# Patient Record
Sex: Male | Born: 1940
Health system: Southern US, Community
[De-identification: ages and names within clinical notes are randomized; demographics above are authoritative.]

## PROBLEM LIST (undated history)

## (undated) DIAGNOSIS — R7301 Impaired fasting glucose: Secondary | ICD-10-CM

## (undated) DIAGNOSIS — Z8719 Personal history of other diseases of the digestive system: Secondary | ICD-10-CM

## (undated) DIAGNOSIS — R0902 Hypoxemia: Secondary | ICD-10-CM

## (undated) DIAGNOSIS — R0602 Shortness of breath: Secondary | ICD-10-CM

## (undated) DIAGNOSIS — Z9981 Dependence on supplemental oxygen: Secondary | ICD-10-CM

## (undated) DIAGNOSIS — J189 Pneumonia, unspecified organism: Secondary | ICD-10-CM

## (undated) DIAGNOSIS — M810 Age-related osteoporosis without current pathological fracture: Secondary | ICD-10-CM

## (undated) DIAGNOSIS — K56609 Unspecified intestinal obstruction, unspecified as to partial versus complete obstruction: Secondary | ICD-10-CM

## (undated) DIAGNOSIS — J449 Chronic obstructive pulmonary disease, unspecified: Secondary | ICD-10-CM

## (undated) DIAGNOSIS — C069 Malignant neoplasm of mouth, unspecified: Secondary | ICD-10-CM

## (undated) DIAGNOSIS — Z8711 Personal history of peptic ulcer disease: Secondary | ICD-10-CM

## (undated) DIAGNOSIS — I2510 Atherosclerotic heart disease of native coronary artery without angina pectoris: Secondary | ICD-10-CM

## (undated) HISTORY — PX: ABDOMINAL SURGERY: SHX537

## (undated) HISTORY — DX: Hypoxemia: R09.02

## (undated) HISTORY — DX: Impaired fasting glucose: R73.01

## (undated) HISTORY — PX: ESOPHAGOGASTRODUODENOSCOPY: SHX1529

## (undated) HISTORY — PX: HERNIA REPAIR: SHX51

## (undated) HISTORY — PX: COLONOSCOPY: SHX174

## (undated) HISTORY — DX: Malignant neoplasm of mouth, unspecified: C06.9

## (undated) HISTORY — DX: Age-related osteoporosis without current pathological fracture: M81.0

## (undated) HISTORY — PX: ABDOMINAL EXPLORATION SURGERY: SHX538

## (undated) HISTORY — PX: GUM SURGERY: SHX658

---

## 1999-05-02 ENCOUNTER — Emergency Department (HOSPITAL_COMMUNITY): Admission: EM | Admit: 1999-05-02 | Discharge: 1999-05-02 | Payer: Self-pay | Admitting: Emergency Medicine

## 1999-05-02 ENCOUNTER — Encounter: Payer: Self-pay | Admitting: Emergency Medicine

## 1999-05-03 ENCOUNTER — Encounter: Payer: Self-pay | Admitting: Emergency Medicine

## 1999-08-15 ENCOUNTER — Ambulatory Visit (HOSPITAL_COMMUNITY): Admission: RE | Admit: 1999-08-15 | Discharge: 1999-08-15 | Payer: Self-pay | Admitting: Gastroenterology

## 2005-01-22 ENCOUNTER — Ambulatory Visit: Payer: Self-pay

## 2007-01-07 ENCOUNTER — Ambulatory Visit: Payer: Self-pay | Admitting: Internal Medicine

## 2007-01-15 ENCOUNTER — Encounter: Payer: Self-pay | Admitting: *Deleted

## 2007-02-23 ENCOUNTER — Ambulatory Visit: Payer: Self-pay | Admitting: Internal Medicine

## 2007-02-23 DIAGNOSIS — J449 Chronic obstructive pulmonary disease, unspecified: Secondary | ICD-10-CM | POA: Insufficient documentation

## 2009-06-04 ENCOUNTER — Encounter (INDEPENDENT_AMBULATORY_CARE_PROVIDER_SITE_OTHER): Payer: Self-pay

## 2009-06-04 ENCOUNTER — Inpatient Hospital Stay (HOSPITAL_COMMUNITY): Admission: EM | Admit: 2009-06-04 | Discharge: 2009-06-14 | Payer: Self-pay | Admitting: Emergency Medicine

## 2009-06-04 HISTORY — PX: UMBILICAL HERNIA REPAIR: SHX196

## 2009-10-05 HISTORY — PX: APPENDECTOMY: SHX54

## 2009-10-15 ENCOUNTER — Inpatient Hospital Stay (HOSPITAL_COMMUNITY): Admission: EM | Admit: 2009-10-15 | Discharge: 2009-10-29 | Payer: Self-pay | Admitting: Emergency Medicine

## 2009-10-19 ENCOUNTER — Encounter (INDEPENDENT_AMBULATORY_CARE_PROVIDER_SITE_OTHER): Payer: Self-pay

## 2010-01-09 ENCOUNTER — Emergency Department (HOSPITAL_COMMUNITY)
Admission: EM | Admit: 2010-01-09 | Discharge: 2010-01-09 | Payer: Self-pay | Source: Home / Self Care | Admitting: Emergency Medicine

## 2010-03-08 NOTE — Miscellaneous (Signed)
Summary: Orders Update pft charges  Clinical Lists Changes  Orders: Added new Service order of Carbon Monoxide diffusing w/capacity (94720) - Signed Added new Service order of Lung Volumes (94240) - Signed Added new Service order of Spirometry (Pre & Post) (94060) - Signed 

## 2010-04-17 LAB — COMPREHENSIVE METABOLIC PANEL
ALT: 13 U/L (ref 0–53)
AST: 19 U/L (ref 0–37)
Albumin: 3.6 g/dL (ref 3.5–5.2)
Alkaline Phosphatase: 49 U/L (ref 39–117)
BUN: 10 mg/dL (ref 6–23)
CO2: 26 mEq/L (ref 19–32)
Calcium: 9.6 mg/dL (ref 8.4–10.5)
Chloride: 105 mEq/L (ref 96–112)
Creatinine, Ser: 0.79 mg/dL (ref 0.4–1.5)
GFR calc Af Amer: >60 mL/min (ref >60)
GFR calc non Af Amer: >60 mL/min (ref >60)
Glucose, Bld: 101 mg/dL — ABNORMAL HIGH (ref 70–99)
Potassium: 4.3 mEq/L (ref 3.5–5.1)
Sodium: 139 mEq/L (ref 135–145)
Total Bilirubin: 1.7 mg/dL — ABNORMAL HIGH (ref 0.3–1.2)
Total Protein: 6.7 g/dL (ref 6.0–8.3)

## 2010-04-17 LAB — CBC
HCT: 41.6 % (ref 39.0–52.0)
Hemoglobin: 13.9 g/dL (ref 13.0–17.0)
MCH: 30.6 pg (ref 26.0–34.0)
MCHC: 33.4 g/dL (ref 30.0–36.0)
MCV: 91.6 fL (ref 78.0–100.0)
Platelets: 226 10*3/uL (ref 150–400)
RBC: 4.54 MIL/uL (ref 4.22–5.81)
RDW: 13.5 % (ref 11.5–15.5)
WBC: 8.2 10*3/uL (ref 4.0–10.5)

## 2010-04-17 LAB — URINALYSIS, ROUTINE W REFLEX MICROSCOPIC
Bilirubin Urine: NEGATIVE
Glucose, UA: NEGATIVE mg/dL
Hgb urine dipstick: NEGATIVE
Ketones, ur: NEGATIVE mg/dL
Nitrite: NEGATIVE
Protein, ur: NEGATIVE mg/dL
Specific Gravity, Urine: 1.021 (ref 1.005–1.030)
Urobilinogen, UA: 0.2 mg/dL (ref 0.0–1.0)
pH: 7.5 (ref 5.0–8.0)

## 2010-04-17 LAB — LIPASE, BLOOD: Lipase: 21 U/L (ref 11–59)

## 2010-04-17 LAB — URINE MICROSCOPIC-ADD ON

## 2010-04-19 LAB — CBC
HCT: 32.8 % — ABNORMAL LOW (ref 39.0–52.0)
HCT: 35.2 % — ABNORMAL LOW (ref 39.0–52.0)
HCT: 40.5 % (ref 39.0–52.0)
HCT: 39 % (ref 39.0–52.0)
HCT: 46.6 % (ref 39.0–52.0)
Hemoglobin: 11.2 g/dL — ABNORMAL LOW (ref 13.0–17.0)
Hemoglobin: 12 g/dL — ABNORMAL LOW (ref 13.0–17.0)
Hemoglobin: 14.3 g/dL (ref 13.0–17.0)
Hemoglobin: 13.1 g/dL (ref 13.0–17.0)
Hemoglobin: 16.4 g/dL (ref 13.0–17.0)
MCH: 30.5 pg (ref 26.0–34.0)
MCH: 31 pg (ref 26.0–34.0)
MCH: 31.7 pg (ref 26.0–34.0)
MCH: 31 pg (ref 26.0–34.0)
MCH: 31.8 pg (ref 26.0–34.0)
MCHC: 34.1 g/dL (ref 30.0–36.0)
MCHC: 34.1 g/dL (ref 30.0–36.0)
MCHC: 35.3 g/dL (ref 30.0–36.0)
MCHC: 33.6 g/dL (ref 30.0–36.0)
MCHC: 35.2 g/dL (ref 30.0–36.0)
MCV: 89.3 fL (ref 78.0–100.0)
MCV: 89.8 fL (ref 78.0–100.0)
MCV: 90.9 fL (ref 78.0–100.0)
MCV: 90.5 fL (ref 78.0–100.0)
MCV: 92.2 fL (ref 78.0–100.0)
Platelets: 163 10*3/uL (ref 150–400)
Platelets: 218 10*3/uL (ref 150–400)
Platelets: 225 10*3/uL (ref 150–400)
Platelets: 202 10*3/uL (ref 150–400)
Platelets: 267 10*3/uL (ref 150–400)
RBC: 3.61 MIL/uL — ABNORMAL LOW (ref 4.22–5.81)
RBC: 3.94 MIL/uL — ABNORMAL LOW (ref 4.22–5.81)
RBC: 4.51 MIL/uL (ref 4.22–5.81)
RBC: 4.23 MIL/uL (ref 4.22–5.81)
RBC: 5.15 MIL/uL (ref 4.22–5.81)
RDW: 12.8 % (ref 11.5–15.5)
RDW: 13 % (ref 11.5–15.5)
RDW: 13.1 % (ref 11.5–15.5)
RDW: 13.2 % (ref 11.5–15.5)
RDW: 13.4 % (ref 11.5–15.5)
WBC: 12.1 10*3/uL — ABNORMAL HIGH (ref 4.0–10.5)
WBC: 7.2 10*3/uL (ref 4.0–10.5)
WBC: 8 10*3/uL (ref 4.0–10.5)
WBC: 15.5 10*3/uL — ABNORMAL HIGH (ref 4.0–10.5)
WBC: 8.3 10*3/uL (ref 4.0–10.5)

## 2010-04-19 LAB — BASIC METABOLIC PANEL
BUN: 2 mg/dL — ABNORMAL LOW (ref 6–23)
BUN: 5 mg/dL — ABNORMAL LOW (ref 6–23)
BUN: 5 mg/dL — ABNORMAL LOW (ref 6–23)
BUN: 8 mg/dL (ref 6–23)
CO2: 26 mEq/L (ref 19–32)
CO2: 29 mEq/L (ref 19–32)
CO2: 32 mEq/L (ref 19–32)
CO2: 26 mEq/L (ref 19–32)
Calcium: 8 mg/dL — ABNORMAL LOW (ref 8.4–10.5)
Calcium: 8.1 mg/dL — ABNORMAL LOW (ref 8.4–10.5)
Calcium: 8.5 mg/dL (ref 8.4–10.5)
Calcium: 8.1 mg/dL — ABNORMAL LOW (ref 8.4–10.5)
Chloride: 101 mEq/L (ref 96–112)
Chloride: 97 mEq/L (ref 96–112)
Chloride: 99 mEq/L (ref 96–112)
Chloride: 107 mEq/L (ref 96–112)
Creatinine, Ser: 0.67 mg/dL (ref 0.4–1.5)
Creatinine, Ser: 0.73 mg/dL (ref 0.4–1.5)
Creatinine, Ser: 0.81 mg/dL (ref 0.4–1.5)
Creatinine, Ser: 0.98 mg/dL (ref 0.4–1.5)
GFR calc Af Amer: >60 mL/min (ref >60)
GFR calc Af Amer: >60 mL/min (ref >60)
GFR calc Af Amer: >60 mL/min (ref >60)
GFR calc Af Amer: >60 mL/min (ref >60)
GFR calc non Af Amer: >60 mL/min (ref >60)
GFR calc non Af Amer: >60 mL/min (ref >60)
GFR calc non Af Amer: >60 mL/min (ref >60)
GFR calc non Af Amer: >60 mL/min (ref >60)
Glucose, Bld: 118 mg/dL — ABNORMAL HIGH (ref 70–99)
Glucose, Bld: 127 mg/dL — ABNORMAL HIGH (ref 70–99)
Glucose, Bld: 144 mg/dL — ABNORMAL HIGH (ref 70–99)
Glucose, Bld: 120 mg/dL — ABNORMAL HIGH (ref 70–99)
Potassium: 3.8 mEq/L (ref 3.5–5.1)
Potassium: 3.9 mEq/L (ref 3.5–5.1)
Potassium: 4.4 mEq/L (ref 3.5–5.1)
Potassium: 4.5 mEq/L (ref 3.5–5.1)
Sodium: 131 mEq/L — ABNORMAL LOW (ref 135–145)
Sodium: 136 mEq/L (ref 135–145)
Sodium: 138 mEq/L (ref 135–145)
Sodium: 138 mEq/L (ref 135–145)

## 2010-04-19 LAB — COMPREHENSIVE METABOLIC PANEL
ALT: 15 U/L (ref 0–53)
AST: 28 U/L (ref 0–37)
Albumin: 3.9 g/dL (ref 3.5–5.2)
Alkaline Phosphatase: 67 U/L (ref 39–117)
BUN: 16 mg/dL (ref 6–23)
CO2: 22 mEq/L (ref 19–32)
Calcium: 9.6 mg/dL (ref 8.4–10.5)
Chloride: 110 mEq/L (ref 96–112)
Creatinine, Ser: 1.02 mg/dL (ref 0.4–1.5)
GFR calc Af Amer: >60 mL/min (ref >60)
GFR calc non Af Amer: >60 mL/min (ref >60)
Glucose, Bld: 154 mg/dL — ABNORMAL HIGH (ref 70–99)
Potassium: 4.6 mEq/L (ref 3.5–5.1)
Sodium: 140 mEq/L (ref 135–145)
Total Bilirubin: 1.8 mg/dL — ABNORMAL HIGH (ref 0.3–1.2)
Total Protein: 7.3 g/dL (ref 6.0–8.3)

## 2010-04-19 LAB — DIFFERENTIAL
Basophils Absolute: 0 10*3/uL (ref 0.0–0.1)
Basophils Relative: 0 % (ref 0–1)
Eosinophils Absolute: 0 10*3/uL (ref 0.0–0.7)
Eosinophils Relative: 0 % (ref 0–5)
Lymphocytes Relative: 7 % — ABNORMAL LOW (ref 12–46)
Lymphs Abs: 1.1 10*3/uL (ref 0.7–4.0)
Monocytes Absolute: 0.8 10*3/uL (ref 0.1–1.0)
Monocytes Relative: 5 % (ref 3–12)
Neutro Abs: 13.6 10*3/uL — ABNORMAL HIGH (ref 1.7–7.7)
Neutrophils Relative %: 87 % — ABNORMAL HIGH (ref 43–77)

## 2010-04-19 LAB — EXPECTORATED SPUTUM ASSESSMENT W GRAM STAIN, RFLX TO RESP C

## 2010-04-19 LAB — MAGNESIUM: Magnesium: 1.5 mg/dL (ref 1.5–2.5)

## 2010-04-19 LAB — CK TOTAL AND CKMB (NOT AT ARMC)
CK, MB: 2.9 ng/mL (ref 0.3–4.0)
Relative Index: INVALID (ref 0.0–2.5)
Total CK: 63 U/L (ref 7–232)

## 2010-04-19 LAB — CULTURE, RESPIRATORY W GRAM STAIN

## 2010-04-19 LAB — URINALYSIS, ROUTINE W REFLEX MICROSCOPIC
Glucose, UA: NEGATIVE mg/dL
Hgb urine dipstick: NEGATIVE
Ketones, ur: 15 mg/dL — AB
Nitrite: NEGATIVE
Protein, ur: NEGATIVE mg/dL
Specific Gravity, Urine: 1.028 (ref 1.005–1.030)
Urobilinogen, UA: 0.2 mg/dL (ref 0.0–1.0)
pH: 5 (ref 5.0–8.0)

## 2010-04-19 LAB — LIPASE, BLOOD: Lipase: 24 U/L (ref 11–59)

## 2010-04-19 LAB — TROPONIN I: Troponin I: 0.01 ng/mL (ref 0.00–0.06)

## 2010-04-19 LAB — PHOSPHORUS: Phosphorus: 2.1 mg/dL — ABNORMAL LOW (ref 2.3–4.6)

## 2010-04-24 LAB — BASIC METABOLIC PANEL
BUN: 2 mg/dL — ABNORMAL LOW (ref 6–23)
BUN: 5 mg/dL — ABNORMAL LOW (ref 6–23)
BUN: 8 mg/dL (ref 6–23)
BUN: 8 mg/dL (ref 6–23)
BUN: 9 mg/dL (ref 6–23)
CO2: 28 mEq/L (ref 19–32)
CO2: 29 mEq/L (ref 19–32)
CO2: 29 mEq/L (ref 19–32)
CO2: 30 mEq/L (ref 19–32)
CO2: 32 mEq/L (ref 19–32)
Calcium: 7.5 mg/dL — ABNORMAL LOW (ref 8.4–10.5)
Calcium: 8 mg/dL — ABNORMAL LOW (ref 8.4–10.5)
Calcium: 8.3 mg/dL — ABNORMAL LOW (ref 8.4–10.5)
Calcium: 8.5 mg/dL (ref 8.4–10.5)
Calcium: 8.8 mg/dL (ref 8.4–10.5)
Chloride: 100 mEq/L (ref 96–112)
Chloride: 100 mEq/L (ref 96–112)
Chloride: 102 mEq/L (ref 96–112)
Chloride: 105 mEq/L (ref 96–112)
Chloride: 107 mEq/L (ref 96–112)
Creatinine, Ser: 0.72 mg/dL (ref 0.4–1.5)
Creatinine, Ser: 0.76 mg/dL (ref 0.4–1.5)
Creatinine, Ser: 0.82 mg/dL (ref 0.4–1.5)
Creatinine, Ser: 0.88 mg/dL (ref 0.4–1.5)
Creatinine, Ser: 0.94 mg/dL (ref 0.4–1.5)
GFR calc Af Amer: >60 mL/min (ref >60)
GFR calc Af Amer: >60 mL/min (ref >60)
GFR calc Af Amer: >60 mL/min (ref >60)
GFR calc Af Amer: >60 mL/min (ref >60)
GFR calc Af Amer: >60 mL/min (ref >60)
GFR calc non Af Amer: >60 mL/min (ref >60)
GFR calc non Af Amer: >60 mL/min (ref >60)
GFR calc non Af Amer: >60 mL/min (ref >60)
GFR calc non Af Amer: >60 mL/min (ref >60)
GFR calc non Af Amer: >60 mL/min (ref >60)
Glucose, Bld: 101 mg/dL — ABNORMAL HIGH (ref 70–99)
Glucose, Bld: 107 mg/dL — ABNORMAL HIGH (ref 70–99)
Glucose, Bld: 119 mg/dL — ABNORMAL HIGH (ref 70–99)
Glucose, Bld: 127 mg/dL — ABNORMAL HIGH (ref 70–99)
Glucose, Bld: 141 mg/dL — ABNORMAL HIGH (ref 70–99)
Potassium: 4 mEq/L (ref 3.5–5.1)
Potassium: 4.1 mEq/L (ref 3.5–5.1)
Potassium: 4.1 mEq/L (ref 3.5–5.1)
Potassium: 4.1 mEq/L (ref 3.5–5.1)
Potassium: 4.9 mEq/L (ref 3.5–5.1)
Sodium: 133 mEq/L — ABNORMAL LOW (ref 135–145)
Sodium: 138 mEq/L (ref 135–145)
Sodium: 139 mEq/L (ref 135–145)
Sodium: 140 mEq/L (ref 135–145)
Sodium: 140 mEq/L (ref 135–145)

## 2010-04-24 LAB — CBC
HCT: 34.9 % — ABNORMAL LOW (ref 39.0–52.0)
HCT: 35.5 % — ABNORMAL LOW (ref 39.0–52.0)
HCT: 35.8 % — ABNORMAL LOW (ref 39.0–52.0)
HCT: 36.8 % — ABNORMAL LOW (ref 39.0–52.0)
HCT: 37.5 % — ABNORMAL LOW (ref 39.0–52.0)
HCT: 38 % — ABNORMAL LOW (ref 39.0–52.0)
HCT: 41.4 % (ref 39.0–52.0)
Hemoglobin: 12.1 g/dL — ABNORMAL LOW (ref 13.0–17.0)
Hemoglobin: 12.1 g/dL — ABNORMAL LOW (ref 13.0–17.0)
Hemoglobin: 12.5 g/dL — ABNORMAL LOW (ref 13.0–17.0)
Hemoglobin: 12.8 g/dL — ABNORMAL LOW (ref 13.0–17.0)
Hemoglobin: 12.9 g/dL — ABNORMAL LOW (ref 13.0–17.0)
Hemoglobin: 13 g/dL (ref 13.0–17.0)
Hemoglobin: 14.2 g/dL (ref 13.0–17.0)
MCHC: 34.1 g/dL (ref 30.0–36.0)
MCHC: 34.3 g/dL (ref 30.0–36.0)
MCHC: 34.3 g/dL (ref 30.0–36.0)
MCHC: 34.3 g/dL (ref 30.0–36.0)
MCHC: 34.7 g/dL (ref 30.0–36.0)
MCHC: 34.8 g/dL (ref 30.0–36.0)
MCHC: 35 g/dL (ref 30.0–36.0)
MCV: 94.3 fL (ref 78.0–100.0)
MCV: 94.3 fL (ref 78.0–100.0)
MCV: 94.6 fL (ref 78.0–100.0)
MCV: 94.9 fL (ref 78.0–100.0)
MCV: 95 fL (ref 78.0–100.0)
MCV: 95 fL (ref 78.0–100.0)
MCV: 95.1 fL (ref 78.0–100.0)
Platelets: 215 10*3/uL (ref 150–400)
Platelets: 228 10*3/uL (ref 150–400)
Platelets: 234 10*3/uL (ref 150–400)
Platelets: 265 10*3/uL (ref 150–400)
Platelets: 270 10*3/uL (ref 150–400)
Platelets: 277 10*3/uL (ref 150–400)
Platelets: 286 10*3/uL (ref 150–400)
RBC: 3.67 MIL/uL — ABNORMAL LOW (ref 4.22–5.81)
RBC: 3.73 MIL/uL — ABNORMAL LOW (ref 4.22–5.81)
RBC: 3.78 MIL/uL — ABNORMAL LOW (ref 4.22–5.81)
RBC: 3.91 MIL/uL — ABNORMAL LOW (ref 4.22–5.81)
RBC: 3.95 MIL/uL — ABNORMAL LOW (ref 4.22–5.81)
RBC: 4.02 MIL/uL — ABNORMAL LOW (ref 4.22–5.81)
RBC: 4.39 MIL/uL (ref 4.22–5.81)
RDW: 12.8 % (ref 11.5–15.5)
RDW: 12.9 % (ref 11.5–15.5)
RDW: 13 % (ref 11.5–15.5)
RDW: 13 % (ref 11.5–15.5)
RDW: 13.2 % (ref 11.5–15.5)
RDW: 13.2 % (ref 11.5–15.5)
RDW: 13.4 % (ref 11.5–15.5)
WBC: 10.4 10*3/uL (ref 4.0–10.5)
WBC: 10.8 10*3/uL — ABNORMAL HIGH (ref 4.0–10.5)
WBC: 6.2 10*3/uL (ref 4.0–10.5)
WBC: 7.4 10*3/uL (ref 4.0–10.5)
WBC: 9.6 10*3/uL (ref 4.0–10.5)
WBC: 9.6 10*3/uL (ref 4.0–10.5)
WBC: 9.6 10*3/uL (ref 4.0–10.5)

## 2010-04-24 LAB — COMPREHENSIVE METABOLIC PANEL
ALT: 20 U/L (ref 0–53)
ALT: 24 U/L (ref 0–53)
AST: 24 U/L (ref 0–37)
AST: 27 U/L (ref 0–37)
Albumin: 3.1 g/dL — ABNORMAL LOW (ref 3.5–5.2)
Albumin: 3.4 g/dL — ABNORMAL LOW (ref 3.5–5.2)
Alkaline Phosphatase: 46 U/L (ref 39–117)
Alkaline Phosphatase: 55 U/L (ref 39–117)
BUN: 11 mg/dL (ref 6–23)
BUN: 12 mg/dL (ref 6–23)
CO2: 22 mEq/L (ref 19–32)
CO2: 27 mEq/L (ref 19–32)
Calcium: 7.7 mg/dL — ABNORMAL LOW (ref 8.4–10.5)
Calcium: 9 mg/dL (ref 8.4–10.5)
Chloride: 106 mEq/L (ref 96–112)
Chloride: 108 mEq/L (ref 96–112)
Creatinine, Ser: 1.01 mg/dL (ref 0.4–1.5)
Creatinine, Ser: 1.13 mg/dL (ref 0.4–1.5)
GFR calc Af Amer: >60 mL/min (ref >60)
GFR calc Af Amer: >60 mL/min (ref >60)
GFR calc non Af Amer: >60 mL/min (ref >60)
GFR calc non Af Amer: >60 mL/min (ref >60)
Glucose, Bld: 119 mg/dL — ABNORMAL HIGH (ref 70–99)
Glucose, Bld: 135 mg/dL — ABNORMAL HIGH (ref 70–99)
Potassium: 3.3 mEq/L — ABNORMAL LOW (ref 3.5–5.1)
Potassium: 4.1 mEq/L (ref 3.5–5.1)
Sodium: 138 mEq/L (ref 135–145)
Sodium: 139 mEq/L (ref 135–145)
Total Bilirubin: 1.3 mg/dL — ABNORMAL HIGH (ref 0.3–1.2)
Total Bilirubin: 1.5 mg/dL — ABNORMAL HIGH (ref 0.3–1.2)
Total Protein: 6 g/dL (ref 6.0–8.3)
Total Protein: 6.7 g/dL (ref 6.0–8.3)

## 2010-04-24 LAB — URINALYSIS, ROUTINE W REFLEX MICROSCOPIC
Glucose, UA: NEGATIVE mg/dL
Hgb urine dipstick: NEGATIVE
Ketones, ur: 40 mg/dL — AB
Nitrite: NEGATIVE
Protein, ur: NEGATIVE mg/dL
Specific Gravity, Urine: 1.039 — ABNORMAL HIGH (ref 1.005–1.030)
Urobilinogen, UA: 1 mg/dL (ref 0.0–1.0)
pH: 6 (ref 5.0–8.0)

## 2010-04-24 LAB — SAMPLE TO BLOOD BANK

## 2010-04-24 LAB — DIFFERENTIAL
Basophils Absolute: 0 10*3/uL (ref 0.0–0.1)
Basophils Relative: 0 % (ref 0–1)
Eosinophils Absolute: 0.1 10*3/uL (ref 0.0–0.7)
Eosinophils Relative: 1 % (ref 0–5)
Lymphocytes Relative: 14 % (ref 12–46)
Lymphs Abs: 1.4 10*3/uL (ref 0.7–4.0)
Monocytes Absolute: 0.8 10*3/uL (ref 0.1–1.0)
Monocytes Relative: 9 % (ref 3–12)
Neutro Abs: 7.4 10*3/uL (ref 1.7–7.7)
Neutrophils Relative %: 77 % (ref 43–77)

## 2010-04-24 LAB — APTT: aPTT: 29 seconds (ref 24–37)

## 2010-04-24 LAB — PHOSPHORUS: Phosphorus: 2.2 mg/dL — ABNORMAL LOW (ref 2.3–4.6)

## 2010-04-24 LAB — MRSA PCR SCREENING: MRSA by PCR: NEGATIVE

## 2010-04-24 LAB — LIPASE, BLOOD: Lipase: 19 U/L (ref 11–59)

## 2010-04-24 LAB — PROTIME-INR
INR: 1.09 (ref 0.00–1.49)
Prothrombin Time: 14 seconds (ref 11.6–15.2)

## 2010-04-24 LAB — MAGNESIUM: Magnesium: 1.9 mg/dL (ref 1.5–2.5)

## 2010-04-24 LAB — LACTIC ACID, PLASMA: Lactic Acid, Venous: 1 mmol/L (ref 0.5–2.2)

## 2010-06-19 NOTE — Assessment & Plan Note (Signed)
[facility]                             PULMONARY OFFICE NOTE   NAME:Lonnie Schaefer, Lonnie Schaefer                        MRN:          [id number]  DATE:01/07/2007                            DOB:          17-Aug-1940    REASON FOR CONSULTATION:  Dyspnea.   HISTORY:  A 70 year old white male who stopped smoking in September 2005  and was not aware of any pulmonary problems at that point.  He gained  from 128 up to 159 pounds now and complains of dyspnea that has  progressed over the last 3 years, but especially over the last 6 months  where he is having trouble getting up into his truck to drive.  This is  the only time he is ever short of breath.  He says if he gets in a real  hurry when he is taking a shower he also notices the same problem.  It  does not seem to vary with weather or environmental change and seems to  make no difference which of the different inhalers he takes in terms of  improving the symptom.  He denies any orthopnea, PND, associated cough  or chest pain, fevers, chills, sweats, orthopnea, PND or leg swelling.  He denies any pleuritic pain, cough, overt sinus or reflux symptoms.   PAST MEDICAL HISTORY:  Significant for the absence of heart disease.   ALLERGIES:  None known.   MEDICATIONS:  Include Spiriva, aspirin, Proventil, Advair and Symbicort.   SOCIAL HISTORY:  He quit smoking in September 2005.  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   FAMILY HISTORY:  Negative for respiratory diseases or atopy.   REVIEW OF SYSTEMS:  Taken in detail and negative except as outlined  above.   PHYSICAL EXAMINATION:  This is a thin ambulatory white man in no acute  distress.  He is somewhat hoarse.  He afebrile with normal vital signs  and he is edentulous.  NECK:  Supple without cervical adenopathy or tenderness.  Trachea is  midline, no thyromegaly.  LUNG FIELDS:  Completely clear bilaterally to auscultation and  percussion although breath sounds are  diminished and [name] sign is  positive at mid inspiration.  There is a regular rate and rhythm without murmur, gallop, rub.  ABDOMEN:  Soft, benign.  EXTREMITIES:  Warm without calf tenderness, cyanosis, clubbing, or  edema.   Spirometry is performed today and his FEV1 is 1.88 with a ratio of 50%.   EKG was reviewed from Dr. Ernestene Mention office and is normal.  A chest x-ray  not available at the time of dictation but I understand has been done  within the last several months and is unremarkable.   IMPRESSION:  Moderate chronic obstructive pulmonary disease which is  disproportionate to his present symptoms and I believe is a result of  deconditioning with about 30 pounds of weight gain since he stopped  smoking.  This is why he is only short of breath with exertion that is  above his usual level.   The good news is that his history suggests a relatively flat response  to  bronchodilators and he probably would do just as well with Spiriva as  monotherapy, stopping all of the other inhalers except for a rescue  inhaler.  Normally, I would recommend Proventil or Foradil as a  rescue.  However, because I am not 100% sure whether he has asthma or  not, I am going to take the unusual approach of using Symbicort as  rescue, ask him to use it up to two puffs every 12 hours if he feels  he needs it.  If his breathing is the same day in and day out with or  without inhalers there is no point, of course, in using the Symbicort.  In fact, an argument can be made to stop the Spiriva as well.   I would like him to return in 6 weeks for a full set of PFTs to get a  better handle on the severity of his COPD, whether or not there is  emphysema present, and whether or not there is reversibility after  bronchodilators to consider further customizing his regimen.     Charlaine Dalton. Sherene Sires, MD, [facility]  Electronically Signed    MBW/MedQ  DD: 01/07/2007  DT: 01/08/2007  Job #: [id number]   cc:   Lonnie Schaefer,  M.D.

## 2010-06-22 NOTE — Procedures (Signed)
[facility]  Patient:    Lonnie Schaefer, Lonnie Schaefer                        MRN: [id number] Proc. Date: 08/15/99 Adm. Date:  [id number] Attending:  Orland Mustard CC:         Colen Darling. Charmayne Sheer., M.D.                           Procedure Report  PROCEDURE PERFORMED:  Esophagogastroduodenscopy with biopsies.  ENDOSCOPIST:  Llana Aliment. Edwards, M.D.  MEDICATIONS:  Hurricaine spray, fentanyl 50 mcg, Versed 7 mg IV.  INDICATIONS:  A nice gentleman had abdominal pain, had upper GI performed which showed a gastric ulcer in the fundus.  He was started on Prevacid and became symptomatic nearly immediately.  He has been asymptomatic since.  The date of the upper GI was in early June.  We have gone ahead and allowed him approximately six weeks of therapy to see if this ulcer would heal.  The reason for this endoscopy is to document healing of the ulcer in no acute distress biopsy it if present.  DESCRIPTION OF PROCEDURE:  The procedure had been explained to the patient and consent obtained.  With the patient in the left lateral decubitus position, the Olympus video endoscope was inserted blindly into the esophagus with deglutination, advanced to the stomach.  The antrum was identified and the pylorus identified and passed.  The duodenum including the bulb and second portion were seen well and was unremarkable.  The scope was withdrawn back into the stomach.  The antrum was carefully examined and was normal.  A biopsy was taken for rapid Urase test for Helicobacter pylori.  No ulcers were seen in the antrum or body.  The fundus and cardia were examined on the retroflex view.  The fundus was very carefully examined and revealed diffuse atrophy but there were no active ulcers. The scope was withdrawn, the distal and proximal esophagus were normal.  The patient tolerated the procedure quite well, was maintained on low-flow oxygen and pulse oximeter throughout the procedure  with no obvious abnormalities.  ASSESSMENT:  Healed gastric ulcer.  PLAN: Will continue the Prevacid for now. Check the results of the test for Helicobacter pylori and see back in the office in six to eight weeks. DD:  08/15/99 TD:  08/15/99 Job: [id number]

## 2010-07-06 ENCOUNTER — Other Ambulatory Visit: Payer: Self-pay | Admitting: Family Medicine

## 2010-07-12 ENCOUNTER — Ambulatory Visit
Admission: RE | Admit: 2010-07-12 | Discharge: 2010-07-12 | Disposition: A | Discharge status: Home / Self Care | Payer: Medicare Other | Source: Ambulatory Visit | Attending: Family Medicine | Admitting: Family Medicine

## 2010-08-24 ENCOUNTER — Other Ambulatory Visit: Payer: Self-pay | Admitting: Otolaryngology

## 2010-09-05 HISTORY — PX: SQUAMOUS CELL CARCINOMA EXCISION: SHX2433

## 2010-09-06 ENCOUNTER — Other Ambulatory Visit: Payer: Self-pay | Admitting: Otolaryngology

## 2010-09-06 DIAGNOSIS — C059 Malignant neoplasm of palate, unspecified: Secondary | ICD-10-CM

## 2010-09-07 ENCOUNTER — Ambulatory Visit
Admission: RE | Admit: 2010-09-07 | Discharge: 2010-09-07 | Disposition: A | Discharge status: Home / Self Care | Payer: Medicare Other | Source: Ambulatory Visit | Attending: Otolaryngology | Admitting: Otolaryngology

## 2010-09-07 DIAGNOSIS — C059 Malignant neoplasm of palate, unspecified: Secondary | ICD-10-CM

## 2010-09-07 MED ORDER — IOHEXOL 300 MG/ML  SOLN
75.0000 mL | Freq: Once | INTRAMUSCULAR | Status: AC | PRN
  Administered 2010-09-07: 75 mL via INTRAVENOUS

## 2010-09-19 ENCOUNTER — Encounter (HOSPITAL_COMMUNITY)
Admission: RE | Admit: 2010-09-19 | Discharge: 2010-09-19 | Disposition: A | Discharge status: Home / Self Care | Payer: Medicare Other | Source: Ambulatory Visit | Attending: Otolaryngology | Admitting: Otolaryngology

## 2010-09-19 ENCOUNTER — Ambulatory Visit (HOSPITAL_COMMUNITY)
Admission: RE | Admit: 2010-09-19 | Discharge: 2010-09-19 | Disposition: A | Discharge status: Home / Self Care | Payer: Medicare Other | Source: Ambulatory Visit | Attending: Otolaryngology | Admitting: Otolaryngology

## 2010-09-19 ENCOUNTER — Other Ambulatory Visit (HOSPITAL_COMMUNITY): Payer: Self-pay | Admitting: Otolaryngology

## 2010-09-19 DIAGNOSIS — Z01812 Encounter for preprocedural laboratory examination: Secondary | ICD-10-CM | POA: Insufficient documentation

## 2010-09-19 DIAGNOSIS — Z87891 Personal history of nicotine dependence: Secondary | ICD-10-CM | POA: Insufficient documentation

## 2010-09-19 DIAGNOSIS — Z01810 Encounter for preprocedural cardiovascular examination: Secondary | ICD-10-CM | POA: Insufficient documentation

## 2010-09-19 DIAGNOSIS — C710 Malignant neoplasm of cerebrum, except lobes and ventricles: Secondary | ICD-10-CM

## 2010-09-19 DIAGNOSIS — Z01818 Encounter for other preprocedural examination: Secondary | ICD-10-CM | POA: Insufficient documentation

## 2010-09-19 DIAGNOSIS — J438 Other emphysema: Secondary | ICD-10-CM | POA: Insufficient documentation

## 2010-09-19 LAB — CBC
HCT: 41.8 % (ref 39.0–52.0)
Hemoglobin: 14.3 g/dL (ref 13.0–17.0)
MCH: 30.8 pg (ref 26.0–34.0)
MCHC: 34.2 g/dL (ref 30.0–36.0)
MCV: 89.9 fL (ref 78.0–100.0)
Platelets: 245 10*3/uL (ref 150–400)
RBC: 4.65 MIL/uL (ref 4.22–5.81)
RDW: 13.2 % (ref 11.5–15.5)
WBC: 6.7 10*3/uL (ref 4.0–10.5)

## 2010-09-19 LAB — DIFFERENTIAL
Basophils Absolute: 0 10*3/uL (ref 0.0–0.1)
Basophils Relative: 0 % (ref 0–1)
Eosinophils Absolute: 0.1 10*3/uL (ref 0.0–0.7)
Eosinophils Relative: 2 % (ref 0–5)
Lymphocytes Relative: 28 % (ref 12–46)
Lymphs Abs: 1.9 10*3/uL (ref 0.7–4.0)
Monocytes Absolute: 0.6 10*3/uL (ref 0.1–1.0)
Monocytes Relative: 9 % (ref 3–12)
Neutro Abs: 4.1 10*3/uL (ref 1.7–7.7)
Neutrophils Relative %: 61 % (ref 43–77)

## 2010-09-19 LAB — SURGICAL PCR SCREEN
MRSA, PCR: NEGATIVE
Staphylococcus aureus: NEGATIVE

## 2010-09-20 ENCOUNTER — Ambulatory Visit (HOSPITAL_COMMUNITY)
Admission: RE | Admit: 2010-09-20 | Discharge: 2010-09-20 | Disposition: A | Discharge status: Home / Self Care | Payer: Medicare Other | Source: Ambulatory Visit | Attending: Otolaryngology | Admitting: Otolaryngology

## 2010-09-20 ENCOUNTER — Other Ambulatory Visit: Payer: Self-pay | Admitting: Otolaryngology

## 2010-09-20 DIAGNOSIS — Z87891 Personal history of nicotine dependence: Secondary | ICD-10-CM | POA: Insufficient documentation

## 2010-09-20 DIAGNOSIS — C059 Malignant neoplasm of palate, unspecified: Secondary | ICD-10-CM | POA: Insufficient documentation

## 2010-09-20 DIAGNOSIS — Z01818 Encounter for other preprocedural examination: Secondary | ICD-10-CM | POA: Insufficient documentation

## 2010-09-20 DIAGNOSIS — Z01812 Encounter for preprocedural laboratory examination: Secondary | ICD-10-CM | POA: Insufficient documentation

## 2010-09-20 DIAGNOSIS — J4489 Other specified chronic obstructive pulmonary disease: Secondary | ICD-10-CM | POA: Insufficient documentation

## 2010-09-20 DIAGNOSIS — J449 Chronic obstructive pulmonary disease, unspecified: Secondary | ICD-10-CM | POA: Insufficient documentation

## 2010-09-20 DIAGNOSIS — Z01810 Encounter for preprocedural cardiovascular examination: Secondary | ICD-10-CM | POA: Insufficient documentation

## 2010-10-18 ENCOUNTER — Other Ambulatory Visit: Payer: Self-pay | Admitting: Otolaryngology

## 2010-10-18 DIAGNOSIS — E041 Nontoxic single thyroid nodule: Secondary | ICD-10-CM

## 2010-11-08 NOTE — Op Note (Signed)
  NAMEJAXX, HUISH NO.:  [id number]  MEDICAL RECORD NO.:  [id number]  LOCATION:  [facility]                         FACILITY:  [facility]  PHYSICIAN:  Suzanna Obey, M.D.       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  SURGICAL PROCEDURES:  Excision of left soft palate, hard palate lesion.  ANESTHESIA:  General.  ESTIMATED BLOOD LOSS:  Less than 5 mL.  INDICATIONS:  This is a 70 year old with a squamous cell carcinoma, biopsy-proven lesion in his left palate that seems to be not deeply invasive but fairly extensive area of white leukoplakic looking area. His CT scan did not show any bone involvement and no significant nodes in the neck.  He was informed of risk and benefits of procedure and options were discussed.  All questions were answered and consent was obtained.  OPERATION:  The patient was taken to operating room, placed supine position after general endotracheal tube anesthesia was placed in the Rose position.  The McIvor mouth gag was inserted and the palate was visualized.  Incision was made around this lesion in a circumferential manner which was located at the posterior edge of the alveolar ridge down onto the soft palate and medially down to the hard palate.  This was about a 2 cm resection area and down to the muscle for the depth. This specimen was sent for frozen section evaluation and all margins were clear.  The wound was then cauterized for good hemostasis and he was then awakened, brought to recovery in stable condition.  Counts correct.          ______________________________ Suzanna Obey, M.D.     JB/MEDQ  D:  09/20/2010  T:  09/20/2010  Job:  [id number]  Electronically Signed by Suzanna Obey M.D. on  11/08/2010 10:25:07 AM

## 2011-02-06 ENCOUNTER — Ambulatory Visit
Admission: RE | Admit: 2011-02-06 | Discharge: 2011-02-06 | Disposition: A | Discharge status: Home / Self Care | Payer: Medicare Other | Source: Ambulatory Visit | Attending: Otolaryngology | Admitting: Otolaryngology

## 2011-02-06 ENCOUNTER — Other Ambulatory Visit (HOSPITAL_COMMUNITY)
Admission: RE | Admit: 2011-02-06 | Discharge: 2011-02-06 | Disposition: A | Discharge status: Home / Self Care | Payer: Medicare Other | Source: Ambulatory Visit | Attending: Interventional Radiology | Admitting: Interventional Radiology

## 2011-02-06 DIAGNOSIS — E049 Nontoxic goiter, unspecified: Secondary | ICD-10-CM | POA: Insufficient documentation

## 2011-02-06 DIAGNOSIS — E041 Nontoxic single thyroid nodule: Secondary | ICD-10-CM

## 2011-02-22 IMAGING — CR DG ABDOMEN 2V
2 series · 2 of 2 positions shown · non-contrast
Comparison: 06/09/2009

CLINICAL DATA: Abdominal pain.  Postop small bowel obstruction.

ABDOMEN - 2 VIEW

[w abdomen upright]
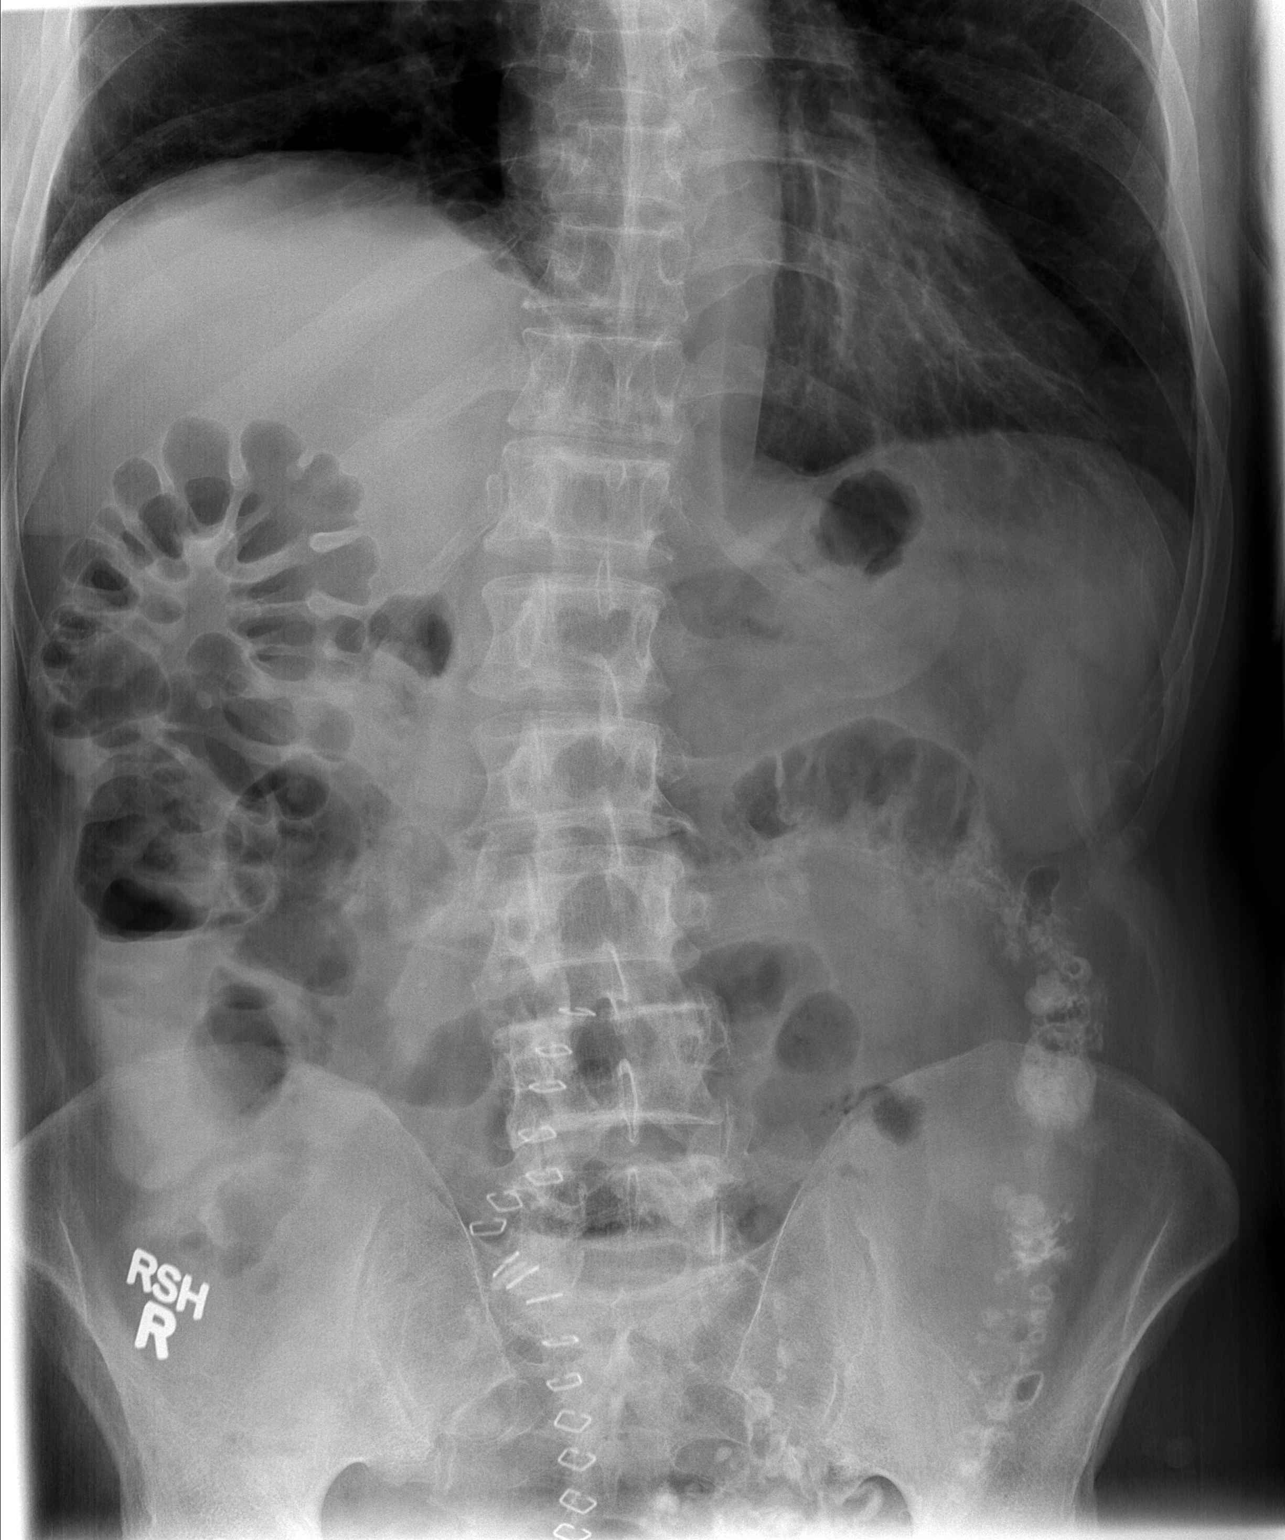

[t abdomen supine]
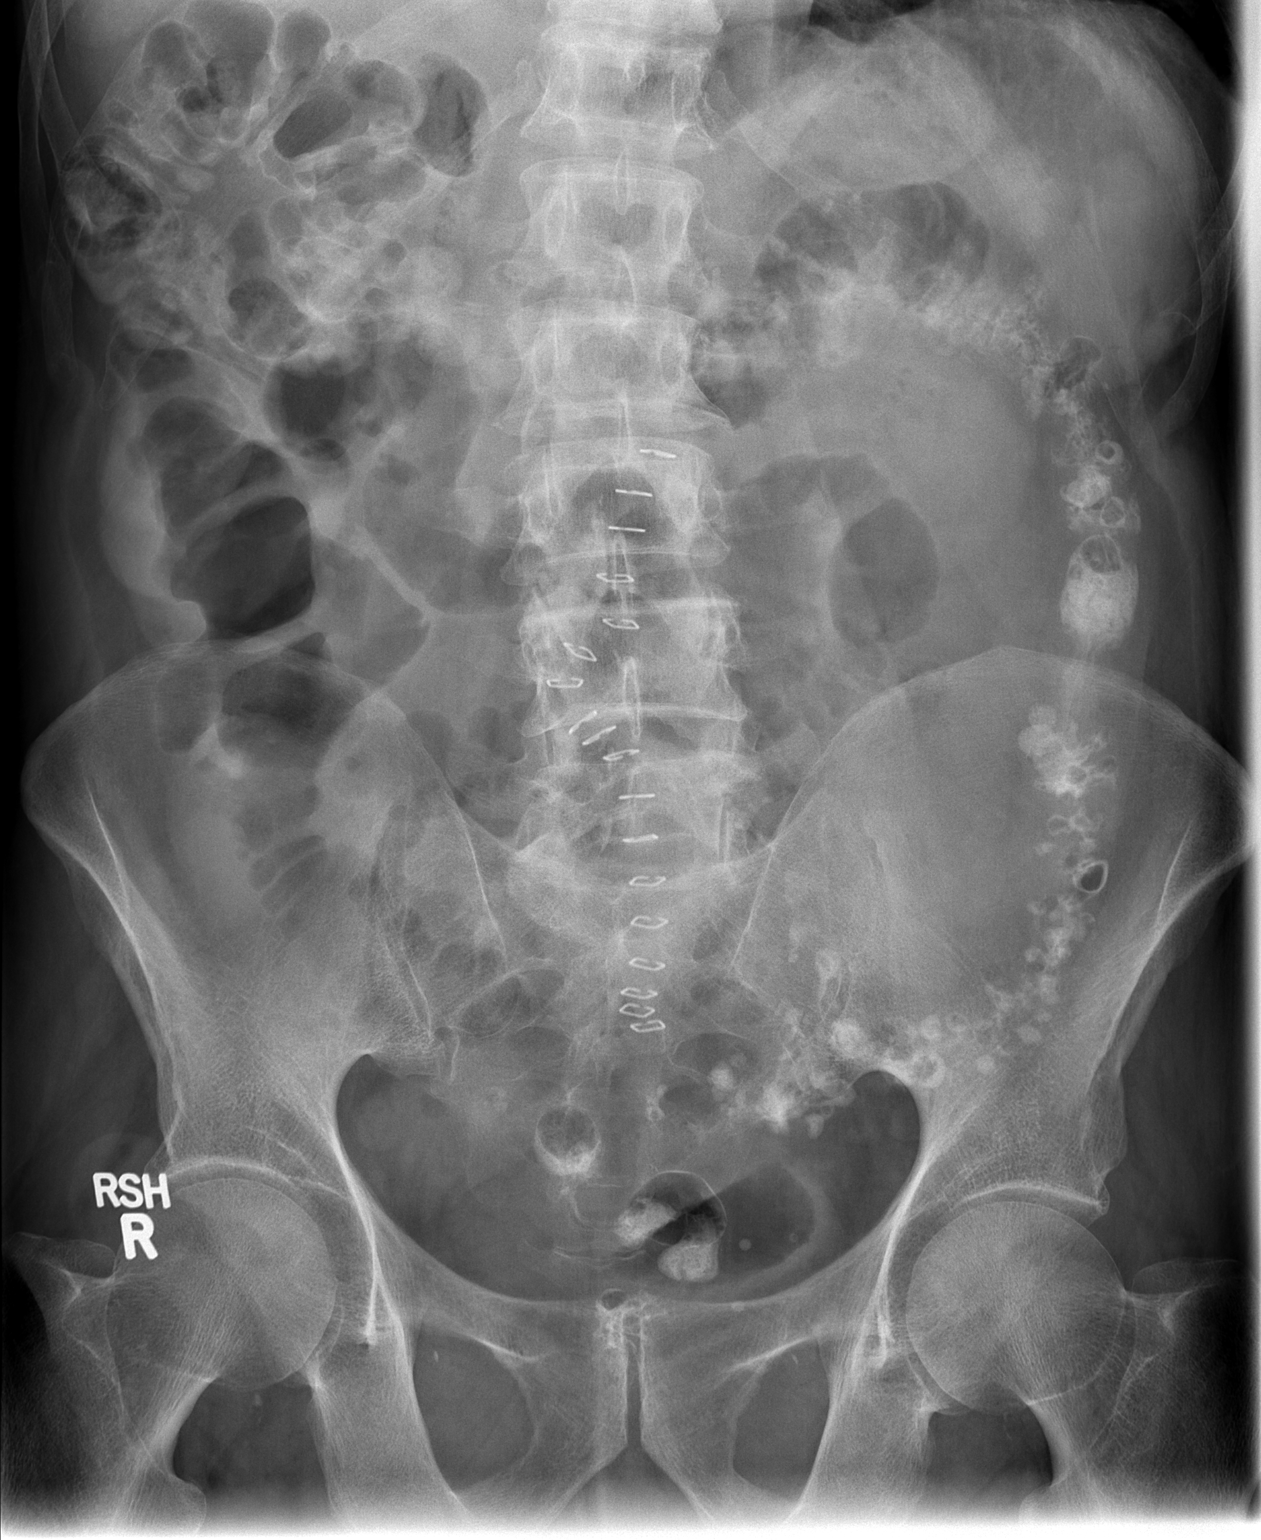

[2 of 2 positions shown; findings below may reference images not displayed]

FINDINGS: There has been interval migration of some oral contrast
into the descending and sigmoid colon.  Mild residual gaseous
prominence of small bowel in the central abdomen.  Gas is seen in
nondistended colon.  Surgical skin staples project over the central
abdomen.  Nasogastric has been removed.
IMPRESSION: Minimal residual gaseous prominence of small bowel, with gas in
nondistended colon.

## 2011-07-06 IMAGING — CR DG ABDOMEN 1V
1 series · 1 of 1 positions shown · non-contrast
Comparison: 10/18/2009

CLINICAL DATA: NG tube placement.  Partial small bowel obstruction.

ABDOMEN - 1 VIEW

[t abdomen supine]
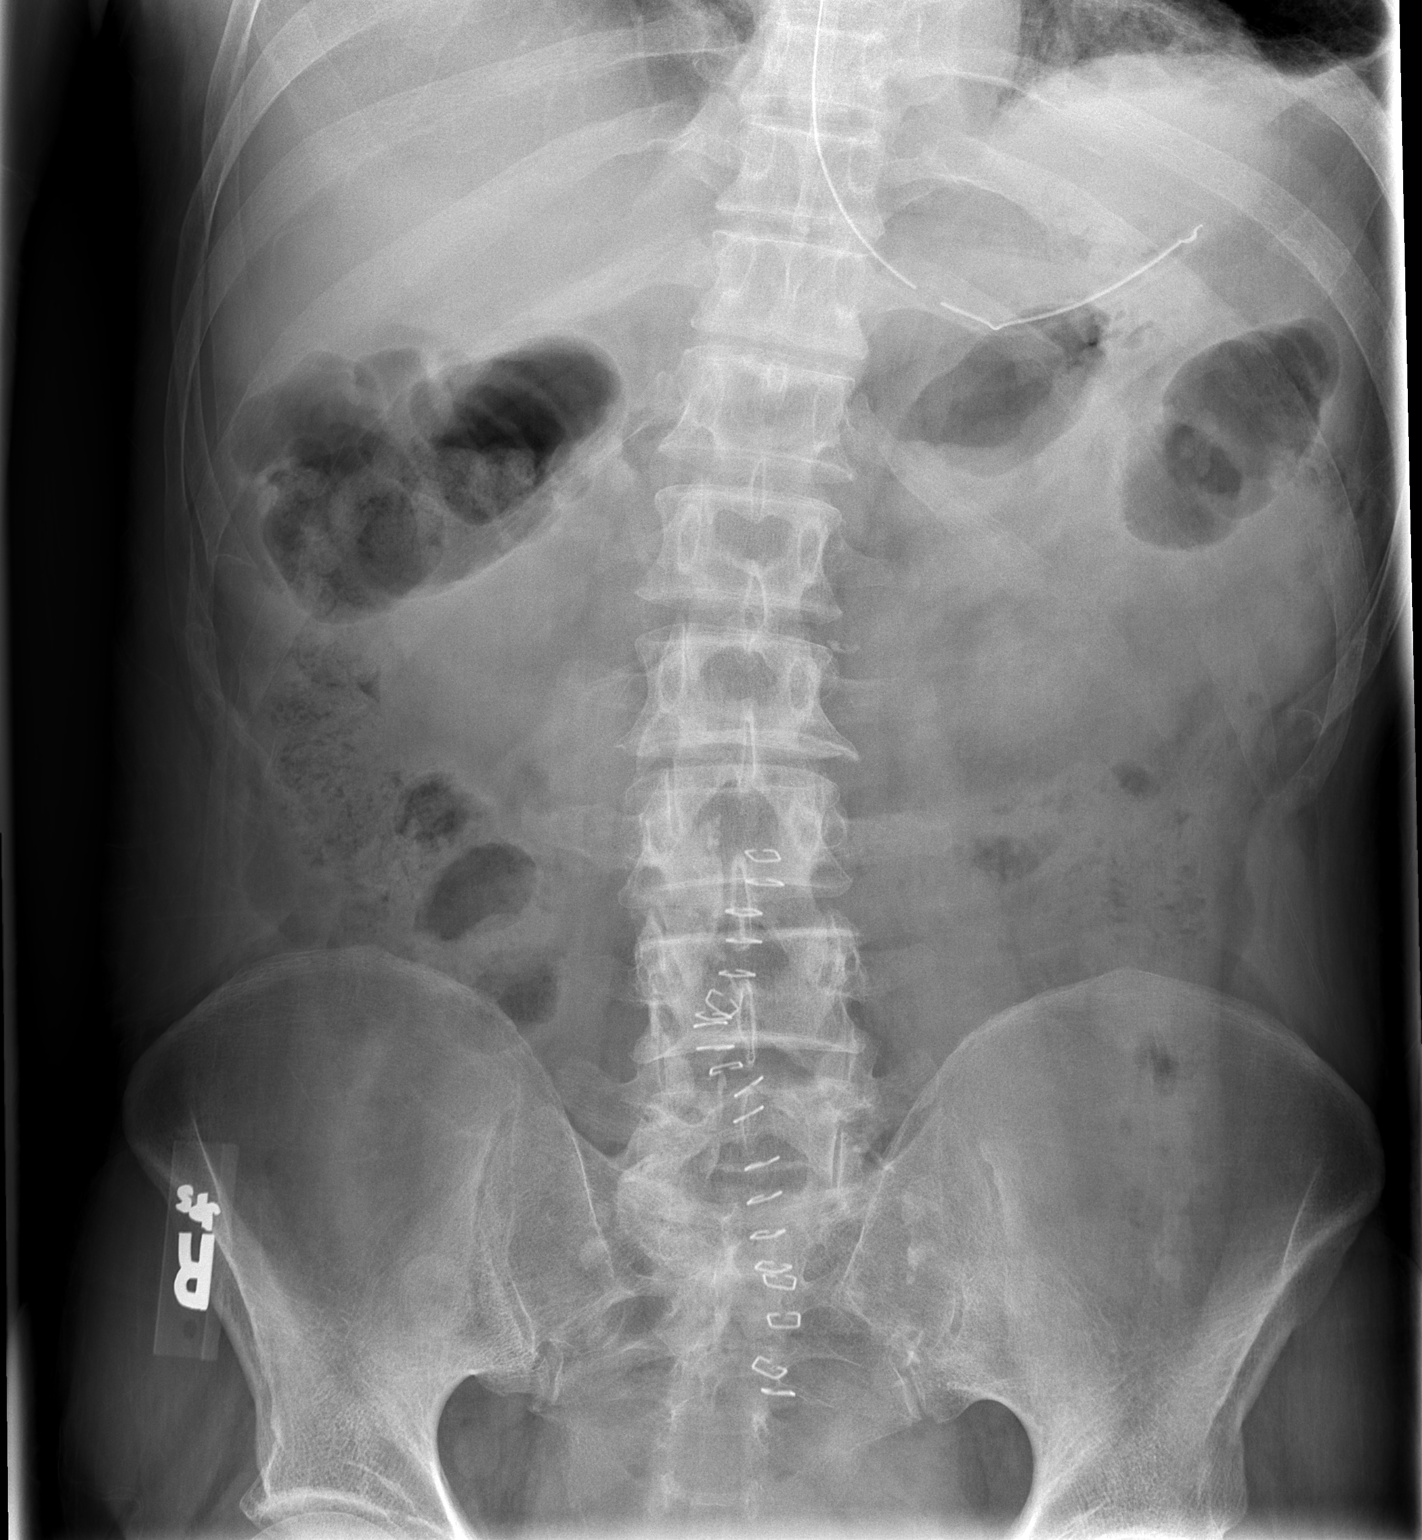

[1 of 1 positions shown; findings below may reference images not displayed]

FINDINGS: An NG tube has been placed with its tip in the fundus of
the stomach.  There is been marked reduction and small bowel
distention.  Gas is present in the colon.  A few dilated small
bowel loops persist in the upper abdomen.
IMPRESSION: Markedly improved partial small bowel obstruction pattern after NG
tube placement with its tip in the fundus of the stomach.

## 2011-09-10 ENCOUNTER — Other Ambulatory Visit: Payer: Self-pay | Admitting: Otolaryngology

## 2011-09-10 DIAGNOSIS — D34 Benign neoplasm of thyroid gland: Secondary | ICD-10-CM

## 2011-09-11 ENCOUNTER — Ambulatory Visit
Admission: RE | Admit: 2011-09-11 | Discharge: 2011-09-11 | Disposition: A | Discharge status: Home / Self Care | Payer: Medicare Other | Source: Ambulatory Visit | Attending: Otolaryngology | Admitting: Otolaryngology

## 2011-09-11 DIAGNOSIS — D34 Benign neoplasm of thyroid gland: Secondary | ICD-10-CM

## 2012-07-13 ENCOUNTER — Other Ambulatory Visit: Payer: Self-pay | Admitting: Family Medicine

## 2012-07-13 DIAGNOSIS — M810 Age-related osteoporosis without current pathological fracture: Secondary | ICD-10-CM

## 2012-07-22 ENCOUNTER — Ambulatory Visit
Admission: RE | Admit: 2012-07-22 | Discharge: 2012-07-22 | Disposition: A | Discharge status: Home / Self Care | Payer: Medicare Other | Source: Ambulatory Visit | Attending: Family Medicine | Admitting: Family Medicine

## 2012-07-22 DIAGNOSIS — M810 Age-related osteoporosis without current pathological fracture: Secondary | ICD-10-CM

## 2012-11-29 ENCOUNTER — Encounter (HOSPITAL_COMMUNITY): Payer: Self-pay | Admitting: Emergency Medicine

## 2012-11-29 ENCOUNTER — Inpatient Hospital Stay (HOSPITAL_COMMUNITY)
Admission: EM | Admit: 2012-11-29 | Discharge: 2012-12-04 | DRG: 390 | Disposition: A | Discharge status: Home / Self Care | Payer: Medicare Other | Attending: General Surgery | Admitting: General Surgery

## 2012-11-29 DIAGNOSIS — Z7982 Long term (current) use of aspirin: Secondary | ICD-10-CM

## 2012-11-29 DIAGNOSIS — J449 Chronic obstructive pulmonary disease, unspecified: Secondary | ICD-10-CM | POA: Diagnosis present

## 2012-11-29 DIAGNOSIS — K56609 Unspecified intestinal obstruction, unspecified as to partial versus complete obstruction: Principal | ICD-10-CM | POA: Diagnosis present

## 2012-11-29 DIAGNOSIS — J4489 Other specified chronic obstructive pulmonary disease: Secondary | ICD-10-CM | POA: Diagnosis present

## 2012-11-29 DIAGNOSIS — Z87891 Personal history of nicotine dependence: Secondary | ICD-10-CM

## 2012-11-29 DIAGNOSIS — R112 Nausea with vomiting, unspecified: Secondary | ICD-10-CM

## 2012-11-29 DIAGNOSIS — Z79899 Other long term (current) drug therapy: Secondary | ICD-10-CM

## 2012-11-29 HISTORY — DX: Pneumonia, unspecified organism: J18.9

## 2012-11-29 HISTORY — DX: Chronic obstructive pulmonary disease, unspecified: J44.9

## 2012-11-29 HISTORY — DX: Shortness of breath: R06.02

## 2012-11-29 HISTORY — DX: Unspecified intestinal obstruction, unspecified as to partial versus complete obstruction: K56.609

## 2012-11-29 HISTORY — DX: Personal history of other diseases of the digestive system: Z87.19

## 2012-11-29 LAB — CBC WITH DIFFERENTIAL/PLATELET
Basophils Absolute: 0 10*3/uL (ref 0.0–0.1)
Basophils Relative: 0 % (ref 0–1)
Eosinophils Absolute: 0.2 10*3/uL (ref 0.0–0.7)
Eosinophils Relative: 3 % (ref 0–5)
HCT: 47.6 % (ref 39.0–52.0)
Hemoglobin: 16.1 g/dL (ref 13.0–17.0)
Lymphocytes Relative: 10 % — ABNORMAL LOW (ref 12–46)
Lymphs Abs: 0.8 10*3/uL (ref 0.7–4.0)
MCH: 31 pg (ref 26.0–34.0)
MCHC: 33.8 g/dL (ref 30.0–36.0)
MCV: 91.7 fL (ref 78.0–100.0)
Monocytes Absolute: 0.3 10*3/uL (ref 0.1–1.0)
Monocytes Relative: 4 % (ref 3–12)
Neutro Abs: 6.8 10*3/uL (ref 1.7–7.7)
Neutrophils Relative %: 84 % — ABNORMAL HIGH (ref 43–77)
Platelets: 299 10*3/uL (ref 150–400)
RBC: 5.19 MIL/uL (ref 4.22–5.81)
RDW: 13.2 % (ref 11.5–15.5)
WBC: 8.1 10*3/uL (ref 4.0–10.5)

## 2012-11-29 LAB — COMPREHENSIVE METABOLIC PANEL
ALT: 11 U/L (ref 0–53)
AST: 18 U/L (ref 0–37)
Albumin: 3.8 g/dL (ref 3.5–5.2)
Alkaline Phosphatase: 64 U/L (ref 39–117)
BUN: 18 mg/dL (ref 6–23)
CO2: 22 mEq/L (ref 19–32)
Calcium: 9.2 mg/dL (ref 8.4–10.5)
Chloride: 102 mEq/L (ref 96–112)
Creatinine, Ser: 1.36 mg/dL — ABNORMAL HIGH (ref 0.50–1.35)
GFR calc Af Amer: 59 mL/min — ABNORMAL LOW (ref >90)
GFR calc non Af Amer: 51 mL/min — ABNORMAL LOW (ref >90)
Glucose, Bld: 192 mg/dL — ABNORMAL HIGH (ref 70–99)
Potassium: 4.3 mEq/L (ref 3.5–5.1)
Sodium: 139 mEq/L (ref 135–145)
Total Bilirubin: 2.6 mg/dL — ABNORMAL HIGH (ref 0.3–1.2)
Total Protein: 7.8 g/dL (ref 6.0–8.3)

## 2012-11-29 LAB — LACTIC ACID, PLASMA: Lactic Acid, Venous: 2.2 mmol/L (ref 0.5–2.2)

## 2012-11-29 LAB — LIPASE, BLOOD: Lipase: 17 U/L (ref 11–59)

## 2012-11-29 MED ORDER — ONDANSETRON HCL 4 MG/2ML IJ SOLN
4.0000 mg | INTRAMUSCULAR | Status: AC
  Administered 2012-11-29: 4 mg via INTRAVENOUS
  Filled 2012-11-29: qty 2

## 2012-11-29 MED ORDER — MORPHINE SULFATE 4 MG/ML IJ SOLN
4.0000 mg | Freq: Once | INTRAMUSCULAR | Status: AC
  Administered 2012-11-29: 4 mg via INTRAVENOUS
  Filled 2012-11-29: qty 1

## 2012-11-29 MED ORDER — SODIUM CHLORIDE 0.9 % IV SOLN
INTRAVENOUS | Status: DC
  Administered 2012-11-29 – 2012-11-30 (×2): via INTRAVENOUS

## 2012-11-29 NOTE — ED Notes (Signed)
Patient with abdominal pain and vomiting since 0100 this morning.  Patient states that it is the same feeling of pain and vomiting with his last bowel obstruction.

## 2012-11-29 NOTE — ED Provider Notes (Signed)
CSN: [id number]     Arrival date & time 11/29/12  2125 History   First MD Initiated Contact with Patient 11/29/12 2155     Chief Complaint  Patient presents with  . Emesis  . Abdominal Pain   (Consider location/radiation/quality/duration/timing/severity/associated sxs/prior Treatment) Patient is a 72 y.o. male presenting with vomiting and abdominal pain.  Emesis Associated symptoms: abdominal pain and diarrhea   Abdominal Pain Associated symptoms: diarrhea, nausea and vomiting    This is a 73 year old male with past medical history significant for prior bowel obstruction, presenting to the ED for sudden onset of abdominal pain, distention, nausea, vomiting, and diarrhea at 0100 this morning.  Patient states he experienced similar symptoms with his last bowel obstruction -- occurred 4 years ago, required surgical repair. He has had approximately 6 episodes of nonbloody, nonbilious emesis today, and approximately 10 episodes of thin, watery diarrhea. He has been passing gas.  Last solid BM was last night.  No PO intake today.  Denies any chest pain, SOB, or palpitations.  Past Medical History  Diagnosis Date  . Bowel obstruction    History reviewed. No pertinent past surgical history. History reviewed. No pertinent family history. History  Substance Use Topics  . Smoking status: Former Games developer  . Smokeless tobacco: Not on file  . Alcohol Use: No    Review of Systems  Gastrointestinal: Positive for nausea, vomiting, abdominal pain, diarrhea and abdominal distention.  All other systems reviewed and are negative.    Allergies  Review of patient's allergies indicates no known allergies.  Home Medications  No current outpatient prescriptions on file. BP 97/63  Pulse 123  Temp(Src) 98.2 F (36.8 C) (Oral)  Resp 16  SpO2 94%  Physical Exam  Nursing note and vitals reviewed. Constitutional: He is oriented to person, place, and time. He appears well-developed and  well-nourished. No distress.  HENT:  Head: Normocephalic and atraumatic.  Mouth/Throat: Oropharynx is clear and moist.  Eyes: Conjunctivae and EOM are normal. Pupils are equal, round, and reactive to light.  Neck: Normal range of motion.  Cardiovascular: Normal rate, regular rhythm and normal heart sounds.   Pulmonary/Chest: Effort normal and breath sounds normal. No respiratory distress. He has no wheezes.  Abdominal: Soft. He exhibits distension. Bowel sounds are decreased. There is tenderness.  Abdomen distended and diffusely tender; faint bowel sounds  Musculoskeletal: Normal range of motion.  Neurological: He is alert and oriented to person, place, and time.  Skin: Skin is warm and dry. He is not diaphoretic.  Psychiatric: He has a normal mood and affect.    ED Course  Procedures (including critical care time) Labs Review Labs Reviewed  CBC WITH DIFFERENTIAL - Abnormal; Notable for the following:    Neutrophils Relative % 84 (*)    Lymphocytes Relative 10 (*)    All other components within normal limits  COMPREHENSIVE METABOLIC PANEL - Abnormal; Notable for the following:    Glucose, Bld 192 (*)    Creatinine, Ser 1.36 (*)    Total Bilirubin 2.6 (*)    GFR calc non Af Amer 51 (*)    GFR calc Af Amer 59 (*)    All other components within normal limits  LIPASE, BLOOD  LACTIC ACID, PLASMA  URINALYSIS, ROUTINE W REFLEX MICROSCOPIC   Imaging Review Ct Abdomen Pelvis W Contrast  11/30/2012   CLINICAL DATA:  Abdominal pain and vomiting.  EXAM: CT ABDOMEN AND PELVIS WITH CONTRAST  TECHNIQUE: Multidetector CT imaging of the abdomen and pelvis  was performed using the standard protocol following bolus administration of intravenous contrast.  CONTRAST:  OMNIPAQUE IOHEXOL 300 MG/ML  SOLN  COMPARISON:  10/15/2009  FINDINGS: Patchy atelectasis/ consolidation in the visualized lung bases, right greater than left. Unremarkable liver, nondilated gallbladder, spleen, adrenal glands,  pancreas. Stable small left renal cyst. No hydronephrosis. Aortoiliac calcified plaque without aneurysm or stenosis. Portal vein patent. Stomach is distended. Multiple dilated proximal small bowel loops with transition to nondilated mid and distal small bowel loops in the left mid abdomen, without associated mass or abscess. The colon is nondistended. Innumerable descending and sigmoid diverticula without adjacent inflammatory/ edematous change. Urinary bladder is nondistended. Mild prostatic prominence. Left pelvic phleboliths. Trace pelvic ascites. No free air. No adenopathy localized. Degenerative disc disease L5-S1.  IMPRESSION: 1. Mid/distal small bowel obstruction without evident etiology, suggesting adhesions. 2. Descending and sigmoid diverticulosis.   Electronically Signed   By: Oley Balm M.D.   On: 11/30/2012 00:26    EKG Interpretation   None       MDM   1. SBO (small bowel obstruction)   2. Nausea & vomiting    Labs as above.  CT revealing mid-distal SBO.  NG tube placed.  Consulted general surgery, spoke with nurse in OR with Dr. Carolynne Edouard.  Has relayed information.  Dr. Carolynne Edouard will come and evaluate pt in the ED.  Additional pain and nausea meds given.  Garlon Hatchet, PA-C 11/30/12 0109

## 2012-11-29 NOTE — ED Notes (Signed)
Pt states he began having vomiting, diarrhea, and abd pain that started around 1am. Pt states he has had about 6 emesis and 10 episodes of diarrhea. Pt states this feels like his last bowel obstruction.

## 2012-11-30 ENCOUNTER — Emergency Department (HOSPITAL_COMMUNITY): Payer: Medicare Other

## 2012-11-30 ENCOUNTER — Inpatient Hospital Stay (HOSPITAL_COMMUNITY): Payer: Medicare Other

## 2012-11-30 ENCOUNTER — Encounter (HOSPITAL_COMMUNITY): Payer: Self-pay | Admitting: Radiology

## 2012-11-30 DIAGNOSIS — R109 Unspecified abdominal pain: Secondary | ICD-10-CM

## 2012-11-30 DIAGNOSIS — K56609 Unspecified intestinal obstruction, unspecified as to partial versus complete obstruction: Secondary | ICD-10-CM

## 2012-11-30 LAB — CBC
HCT: 39.3 % (ref 39.0–52.0)
Hemoglobin: 13.2 g/dL (ref 13.0–17.0)
MCH: 31.4 pg (ref 26.0–34.0)
MCHC: 33.6 g/dL (ref 30.0–36.0)
MCV: 93.3 fL (ref 78.0–100.0)
Platelets: 231 10*3/uL (ref 150–400)
RBC: 4.21 MIL/uL — ABNORMAL LOW (ref 4.22–5.81)
RDW: 13.6 % (ref 11.5–15.5)
WBC: 9.4 10*3/uL (ref 4.0–10.5)

## 2012-11-30 LAB — BASIC METABOLIC PANEL
BUN: 20 mg/dL (ref 6–23)
CO2: 23 mEq/L (ref 19–32)
Calcium: 7.6 mg/dL — ABNORMAL LOW (ref 8.4–10.5)
Chloride: 107 mEq/L (ref 96–112)
Creatinine, Ser: 1.22 mg/dL (ref 0.50–1.35)
GFR calc Af Amer: 68 mL/min — ABNORMAL LOW (ref >90)
GFR calc non Af Amer: 58 mL/min — ABNORMAL LOW (ref >90)
Glucose, Bld: 143 mg/dL — ABNORMAL HIGH (ref 70–99)
Potassium: 4.9 mEq/L (ref 3.5–5.1)
Sodium: 139 mEq/L (ref 135–145)

## 2012-11-30 MED ORDER — MORPHINE SULFATE 2 MG/ML IJ SOLN
2.0000 mg | INTRAMUSCULAR | Status: DC | PRN
  Administered 2012-11-30 – 2012-12-01 (×3): 2 mg via INTRAVENOUS
  Filled 2012-11-30 (×3): qty 1

## 2012-11-30 MED ORDER — PANTOPRAZOLE SODIUM 40 MG IV SOLR
40.0000 mg | Freq: Every day | INTRAVENOUS | Status: DC
  Administered 2012-11-30 – 2012-12-03 (×4): 40 mg via INTRAVENOUS
  Filled 2012-11-30 (×5): qty 40

## 2012-11-30 MED ORDER — ONDANSETRON HCL 4 MG/2ML IJ SOLN: 4.0000 mg | Freq: Four times a day (QID) | INTRAMUSCULAR | Status: DC | PRN

## 2012-11-30 MED ORDER — ALBUTEROL SULFATE HFA 108 (90 BASE) MCG/ACT IN AERS: 2.0000 | INHALATION_SPRAY | Freq: Four times a day (QID) | RESPIRATORY_TRACT | Status: DC | PRN

## 2012-11-30 MED ORDER — HEPARIN SODIUM (PORCINE) 5000 UNIT/ML IJ SOLN
5000.0000 [IU] | Freq: Three times a day (TID) | INTRAMUSCULAR | Status: DC
  Administered 2012-11-30 – 2012-12-04 (×13): 5000 [IU] via SUBCUTANEOUS
  Filled 2012-11-30 (×13): qty 1

## 2012-11-30 MED ORDER — ONDANSETRON HCL 4 MG/2ML IJ SOLN
4.0000 mg | INTRAMUSCULAR | Status: AC
  Administered 2012-11-30: 4 mg via INTRAVENOUS
  Filled 2012-11-30: qty 2

## 2012-11-30 MED ORDER — IOHEXOL 300 MG/ML  SOLN
100.0000 mL | Freq: Once | INTRAMUSCULAR | Status: AC | PRN
  Administered 2012-11-30: 100 mL via INTRAVENOUS

## 2012-11-30 MED ORDER — TIOTROPIUM BROMIDE MONOHYDRATE 18 MCG IN CAPS
18.0000 ug | ORAL_CAPSULE | Freq: Every day | RESPIRATORY_TRACT | Status: DC
  Administered 2012-12-01 – 2012-12-04 (×4): 18 ug via RESPIRATORY_TRACT
  Filled 2012-11-30 (×2): qty 5

## 2012-11-30 MED ORDER — KCL IN DEXTROSE-NACL 20-5-0.9 MEQ/L-%-% IV SOLN
INTRAVENOUS | Status: DC
  Administered 2012-11-30 – 2012-12-04 (×9): via INTRAVENOUS
  Filled 2012-11-30 (×13): qty 1000

## 2012-11-30 MED ORDER — MORPHINE SULFATE 4 MG/ML IJ SOLN
4.0000 mg | Freq: Once | INTRAMUSCULAR | Status: AC
  Administered 2012-11-30: 4 mg via INTRAVENOUS
  Filled 2012-11-30: qty 1

## 2012-11-30 NOTE — ED Notes (Signed)
Unsuccessful  attempted X3 to  place gastric tube.

## 2012-11-30 NOTE — ED Notes (Signed)
Pt family updated about the room #

## 2012-11-30 NOTE — ED Notes (Signed)
Patient transported to CT 

## 2012-11-30 NOTE — Progress Notes (Signed)
Patient ID: Lonnie Schaefer, male   DOB: Sep 11, 1940, 72 y.o.   MRN: [id number]    Subjective: Patient reports he is feeling, "about the same, maybe a little better." He has had flatus yesterday, but nothing yet today. His nausea is controlled with medications. He abdominal pain is controlled with medications. He desires to have ice chips.  Objective: Vital signs in last 24 hours: Temp:  [97.9 F (36.6 C)-98.2 F (36.8 C)] 97.9 F (36.6 C) (10/27 0332) Pulse Rate:  [96-123] 100 (10/27 0332) Resp:  [16] 16 (10/27 0332) BP: (97-122)/(58-76) 122/76 mmHg (10/27 0332) SpO2:  [89 %-99 %] 93 % (10/27 0332) Weight:  [166 lb 10.7 oz (75.6 kg)] 166 lb 10.7 oz (75.6 kg) (10/27 0332) Last BM Date: 11/30/12  Intake/Output from previous day:   Intake/Output this shift:    PE: Gen: Lying in bed, NG placed with minimal green drainage in tubing. CV: RRR Chest: CTAB. No wheezing, rales. Or crackles. Abd: Soft. Mild tenderness diffusely. No guarding or rebound. Moderately distended. Bs present. No mass palpated. Midline scar well healed.  Ext: No erythema,edema or tenderness. SCD's at bedside not placed.   Lab Results:   Recent Labs  11/29/12 2155 11/30/12 0502  WBC 8.1 9.4  HGB 16.1 13.2  HCT 47.6 39.3  PLT 299 231   BMET  Recent Labs  11/29/12 2155 11/30/12 0502  NA 139 139  K 4.3 4.9  CL 102 107  CO2 22 23  GLUCOSE 192* 143*  BUN 18 20  CREATININE 1.36* 1.22  CALCIUM 9.2 7.6*   PT/INR No results found for this basename: LABPROT, INR,  in the last 72 hours CMP     Component Value Date/Time   NA 139 11/30/2012 0502   K 4.9 11/30/2012 0502   CL 107 11/30/2012 0502   CO2 23 11/30/2012 0502   GLUCOSE 143* 11/30/2012 0502   BUN 20 11/30/2012 0502   CREATININE 1.22 11/30/2012 0502   CALCIUM 7.6* 11/30/2012 0502   PROT 7.8 11/29/2012 2155   ALBUMIN 3.8 11/29/2012 2155   AST 18 11/29/2012 2155   ALT 11 11/29/2012 2155   ALKPHOS 64 11/29/2012 2155   BILITOT 2.6* 11/29/2012  2155   GFRNONAA 58* 11/30/2012 0502   GFRAA 68* 11/30/2012 0502   Lipase     Component Value Date/Time   LIPASE 17 11/29/2012 2155       Studies/Results: Dg Abd 1 View  11/30/2012   CLINICAL DATA:  14 French NG tube placed under fluoro  EXAM: ABDOMEN - 1 VIEW  COMPARISON:  None.  FLUOROSCOPY TIME:  FLUOROSCOPY TIME 53 seconds.  FINDINGS: An enteric tube was placed under fluoroscopy has been submitted for interpretation.  A single image capture was provided which demonstrates an enteric tube terminating in the distal gastric body.  IMPRESSION: Enteric tube terminates in the distal gastric body.   Electronically Signed   By: Charline Bills M.D.   On: 11/30/2012 09:13   Ct Abdomen Pelvis W Contrast  11/30/2012   CLINICAL DATA:  Abdominal pain and vomiting.  EXAM: CT ABDOMEN AND PELVIS WITH CONTRAST  TECHNIQUE: Multidetector CT imaging of the abdomen and pelvis was performed using the standard protocol following bolus administration of intravenous contrast.  CONTRAST:  OMNIPAQUE IOHEXOL 300 MG/ML  SOLN  COMPARISON:  10/15/2009  FINDINGS: Patchy atelectasis/ consolidation in the visualized lung bases, right greater than left. Unremarkable liver, nondilated gallbladder, spleen, adrenal glands, pancreas. Stable small left renal cyst. No hydronephrosis. Aortoiliac  calcified plaque without aneurysm or stenosis. Portal vein patent. Stomach is distended. Multiple dilated proximal small bowel loops with transition to nondilated mid and distal small bowel loops in the left mid abdomen, without associated mass or abscess. The colon is nondistended. Innumerable descending and sigmoid diverticula without adjacent inflammatory/ edematous change. Urinary bladder is nondistended. Mild prostatic prominence. Left pelvic phleboliths. Trace pelvic ascites. No free air. No adenopathy localized. Degenerative disc disease L5-S1.  IMPRESSION: 1. Mid/distal small bowel obstruction without evident etiology,  suggesting adhesions. 2. Descending and sigmoid diverticulosis.   Electronically Signed   By: Oley Balm M.D.   On: 11/30/2012 00:26   Dg Abd Portable 2v  11/30/2012   CLINICAL DATA:  Small-bowel obstruction  EXAM: PORTABLE ABDOMEN - 2 VIEW  COMPARISON:  11/30/2012  FINDINGS: Contrast material is now noted throughout the colon consistent with a progression from the prior exam. The degree of small bowel dilatation is slightly decreased when compare with the prior exam. No free air is seen. The stomach remains distended.  IMPRESSION: Contrast material is now noted within the colon indicating the obstructive changes likely related to a partial small bowel obstruction. The degree of dilatation is stable from the prior study.   Electronically Signed   By: Alcide Clever M.D.   On: 11/30/2012 07:23    Anti-infectives: Anti-infectives   None     Assessment: Partial SBO  COPD H/o hiatal hernia repair (~2011) H/o Appendectomy H/o small bowel obstruction x2  Plan:  Partial SBO: NG tube placed by IR this morning.  Bowel rest and serial exams. If he does not improve over the next 24-48 hours then he may require exploration. Surgeon discussed this treatment plan with him and he is in agreement. NG tube placed  Diet: NPO, ice chips allowed DVT proph: Hep SQ No antibiotics warranted at this time, WBC normal. Afebrile.     LOS: 1 day   Felix Pacini 11/30/2012, 10:39 AM Pager: [phone]

## 2012-11-30 NOTE — Progress Notes (Signed)
Less distended, but still no flatus.    Has had NG for a few hours.  Will repeat films in AM.  Continue NG tube and bowel rest.  May eventually need surgery.  Wilmon Arms. Corliss Skains, MD, [facility]  General/ Trauma Surgery  11/30/2012 1:22 PM

## 2012-11-30 NOTE — H&P (Signed)
Lonnie Schaefer is an 72 y.o. male.   Chief Complaint: abdominal pain HPI: The patient is a 72 year old white male who began having abdominal pain on Sunday morning about 1 AM.  His pain has been fairly constant. All day Sunday he had diarrhea. He had one episode of vomiting about 10 AM. He denies any fevers or chills. The diarrhea has resolved and he states that he is still passing some flatus. He had a CT scan done that suggested a small bowel obstruction most likely secondary to adhesions. He has a history of 2 abdominal surgeries for bowel obstruction, the last was approximately 4 years ago. He feels better now and is not complaining of abdominal pain  Past Medical History  Diagnosis Date  . Bowel obstruction     History reviewed. No pertinent past surgical history.  History reviewed. No pertinent family history. Social History:  reports that he has quit smoking. He does not have any smokeless tobacco history on file. He reports that he does not drink alcohol or use illicit drugs.  Allergies: No Known Allergies   (Not in a hospital admission)  Results for orders placed during the hospital encounter of 11/29/12 (from the past 48 hour(s))  CBC WITH DIFFERENTIAL     Status: Abnormal   Collection Time    11/29/12  9:55 PM      Result Value Range   WBC 8.1  4.0 - 10.5 K/uL   RBC 5.19  4.22 - 5.81 MIL/uL   Hemoglobin 16.1  13.0 - 17.0 g/dL   HCT 16.1  09.6 - 04.5 %   MCV 91.7  78.0 - 100.0 fL   MCH 31.0  26.0 - 34.0 pg   MCHC 33.8  30.0 - 36.0 g/dL   RDW 40.9  81.1 - 91.4 %   Platelets 299  150 - 400 K/uL   Neutrophils Relative % 84 (*) 43 - 77 %   Neutro Abs 6.8  1.7 - 7.7 K/uL   Lymphocytes Relative 10 (*) 12 - 46 %   Lymphs Abs 0.8  0.7 - 4.0 K/uL   Monocytes Relative 4  3 - 12 %   Monocytes Absolute 0.3  0.1 - 1.0 K/uL   Eosinophils Relative 3  0 - 5 %   Eosinophils Absolute 0.2  0.0 - 0.7 K/uL   Basophils Relative 0  0 - 1 %   Basophils Absolute 0.0  0.0 - 0.1 K/uL   LIPASE, BLOOD     Status: None   Collection Time    11/29/12  9:55 PM      Result Value Range   Lipase 17  11 - 59 U/L  COMPREHENSIVE METABOLIC PANEL     Status: Abnormal   Collection Time    11/29/12  9:55 PM      Result Value Range   Sodium 139  135 - 145 mEq/L   Potassium 4.3  3.5 - 5.1 mEq/L   Chloride 102  96 - 112 mEq/L   CO2 22  19 - 32 mEq/L   Glucose, Bld 192 (*) 70 - 99 mg/dL   BUN 18  6 - 23 mg/dL   Creatinine, Ser 7.82 (*) 0.50 - 1.35 mg/dL   Calcium 9.2  8.4 - 95.6 mg/dL   Total Protein 7.8  6.0 - 8.3 g/dL   Albumin 3.8  3.5 - 5.2 g/dL   AST 18  0 - 37 U/L   ALT 11  0 - 53 U/L  Alkaline Phosphatase 64  39 - 117 U/L   Total Bilirubin 2.6 (*) 0.3 - 1.2 mg/dL   GFR calc non Af Amer 51 (*) >90 mL/min   GFR calc Af Amer 59 (*) >90 mL/min   Comment: (NOTE)     The eGFR has been calculated using the CKD EPI equation.     This calculation has not been validated in all clinical situations.     eGFR's persistently <90 mL/min signify possible Chronic Kidney     Disease.  LACTIC ACID, PLASMA     Status: None   Collection Time    11/29/12 10:14 PM      Result Value Range   Lactic Acid, Venous 2.2  0.5 - 2.2 mmol/L   Ct Abdomen Pelvis W Contrast  11/30/2012   CLINICAL DATA:  Abdominal pain and vomiting.  EXAM: CT ABDOMEN AND PELVIS WITH CONTRAST  TECHNIQUE: Multidetector CT imaging of the abdomen and pelvis was performed using the standard protocol following bolus administration of intravenous contrast.  CONTRAST:  OMNIPAQUE IOHEXOL 300 MG/ML  SOLN  COMPARISON:  10/15/2009  FINDINGS: Patchy atelectasis/ consolidation in the visualized lung bases, right greater than left. Unremarkable liver, nondilated gallbladder, spleen, adrenal glands, pancreas. Stable small left renal cyst. No hydronephrosis. Aortoiliac calcified plaque without aneurysm or stenosis. Portal vein patent. Stomach is distended. Multiple dilated proximal small bowel loops with transition to nondilated mid  and distal small bowel loops in the left mid abdomen, without associated mass or abscess. The colon is nondistended. Innumerable descending and sigmoid diverticula without adjacent inflammatory/ edematous change. Urinary bladder is nondistended. Mild prostatic prominence. Left pelvic phleboliths. Trace pelvic ascites. No free air. No adenopathy localized. Degenerative disc disease L5-S1.  IMPRESSION: 1. Mid/distal small bowel obstruction without evident etiology, suggesting adhesions. 2. Descending and sigmoid diverticulosis.   Electronically Signed   By: Oley Balm M.D.   On: 11/30/2012 00:26    Review of Systems  Constitutional: Negative.   HENT: Negative.   Eyes: Negative.   Respiratory: Negative.   Cardiovascular: Negative.   Gastrointestinal: Positive for nausea, vomiting, abdominal pain and diarrhea.  Genitourinary: Negative.   Musculoskeletal: Negative.   Skin: Negative.   Neurological: Negative.   Endo/Heme/Allergies: Negative.   Psychiatric/Behavioral: Negative.     Blood pressure 98/61, pulse 108, temperature 98.2 F (36.8 C), temperature source Oral, resp. rate 16, SpO2 96.00%. Physical Exam  Constitutional: He is oriented to person, place, and time. He appears well-developed and well-nourished.  HENT:  Head: Normocephalic and atraumatic.  Eyes: Conjunctivae and EOM are normal. Pupils are equal, round, and reactive to light.  Neck: Normal range of motion. Neck supple.  Cardiovascular: Normal rate, regular rhythm and normal heart sounds.   Respiratory: Effort normal and breath sounds normal.  GI: Soft. Bowel sounds are normal.  There is mild to moderate distension. No guarding or peritonitis. Good bs. Well healed midline scar  Musculoskeletal: Normal range of motion.  Neurological: He is alert and oriented to person, place, and time.  Skin: Skin is warm and dry.  Psychiatric: He has a normal mood and affect. His behavior is normal.     Assessment/Plan The patient  has evidence of at least a partial small bowel obstruction. Since his abdomen is otherwise soft and he does not appear in a like think it would be reasonable to treat him with NG tube and bowel rest and serial exams. If he does not improve over the next 24-48 hours then he may require  exploration. I've discussed this treatment plan with him and he is in agreement.  TOTH III,Esmay Amspacher S 11/30/2012, 2:07 AM

## 2012-12-01 LAB — URINALYSIS, ROUTINE W REFLEX MICROSCOPIC
Bilirubin Urine: NEGATIVE
Glucose, UA: NEGATIVE mg/dL
Hgb urine dipstick: NEGATIVE
Ketones, ur: NEGATIVE mg/dL
Leukocytes, UA: NEGATIVE
Nitrite: NEGATIVE
Protein, ur: NEGATIVE mg/dL
Specific Gravity, Urine: 1.043 — ABNORMAL HIGH (ref 1.005–1.030)
Urobilinogen, UA: 0.2 mg/dL (ref 0.0–1.0)
pH: 5.5 (ref 5.0–8.0)

## 2012-12-01 NOTE — Progress Notes (Signed)
Subjective: He says he is less distended, feels sore but better, not taking anything for pain.  No flatus.  NG sump filter changed. Ng working.  Objective: Vital signs in last 24 hours: Temp:  [97.8 F (36.6 C)-98.2 F (36.8 C)] 98.1 F (36.7 C) (10/28 0848) Pulse Rate:  [79-90] 90 (10/28 0848) Resp:  [17-20] 18 (10/28 0848) BP: (95-115)/(47-67) 115/66 mmHg (10/28 0848) SpO2:  [93 %-97 %] 93 % (10/28 0546) Last BM Date: 11/30/12 100 from NG; NPO 775 urine Afebrile, VSS No labs Intake/Output from previous day: 10/27 0701 - 10/28 0700 In: 1531.7 [I.V.:1531.7] Out: 875 [Urine:775; Emesis/NG output:100] Intake/Output this shift:    General appearance: alert, cooperative and no distress Resp: clear to auscultation bilaterally GI: soft, mildly distended,  very few BS.  no real pain, just sore.  Lab Results:   Recent Labs  11/29/12 2155 11/30/12 0502  WBC 8.1 9.4  HGB 16.1 13.2  HCT 47.6 39.3  PLT 299 231    BMET  Recent Labs  11/29/12 2155 11/30/12 0502  NA 139 139  K 4.3 4.9  CL 102 107  CO2 22 23  GLUCOSE 192* 143*  BUN 18 20  CREATININE 1.36* 1.22  CALCIUM 9.2 7.6*   PT/INR No results found for this basename: LABPROT, INR,  in the last 72 hours   Recent Labs Lab 11/29/12 2155  AST 18  ALT 11  ALKPHOS 64  BILITOT 2.6*  PROT 7.8  ALBUMIN 3.8     Lipase     Component Value Date/Time   LIPASE 17 11/29/2012 2155     Studies/Results: Dg Abd 1 View  11/30/2012   CLINICAL DATA:  14 French NG tube placed under fluoro  EXAM: ABDOMEN - 1 VIEW  COMPARISON:  None.  FLUOROSCOPY TIME:  FLUOROSCOPY TIME 53 seconds.  FINDINGS: An enteric tube was placed under fluoroscopy has been submitted for interpretation.  A single image capture was provided which demonstrates an enteric tube terminating in the distal gastric body.  IMPRESSION: Enteric tube terminates in the distal gastric body.   Electronically Signed   By: Charline Bills M.D.   On: 11/30/2012  09:13   Ct Abdomen Pelvis W Contrast  11/30/2012   CLINICAL DATA:  Abdominal pain and vomiting.  EXAM: CT ABDOMEN AND PELVIS WITH CONTRAST  TECHNIQUE: Multidetector CT imaging of the abdomen and pelvis was performed using the standard protocol following bolus administration of intravenous contrast.  CONTRAST:  OMNIPAQUE IOHEXOL 300 MG/ML  SOLN  COMPARISON:  10/15/2009  FINDINGS: Patchy atelectasis/ consolidation in the visualized lung bases, right greater than left. Unremarkable liver, nondilated gallbladder, spleen, adrenal glands, pancreas. Stable small left renal cyst. No hydronephrosis. Aortoiliac calcified plaque without aneurysm or stenosis. Portal vein patent. Stomach is distended. Multiple dilated proximal small bowel loops with transition to nondilated mid and distal small bowel loops in the left mid abdomen, without associated mass or abscess. The colon is nondistended. Innumerable descending and sigmoid diverticula without adjacent inflammatory/ edematous change. Urinary bladder is nondistended. Mild prostatic prominence. Left pelvic phleboliths. Trace pelvic ascites. No free air. No adenopathy localized. Degenerative disc disease L5-S1.  IMPRESSION: 1. Mid/distal small bowel obstruction without evident etiology, suggesting adhesions. 2. Descending and sigmoid diverticulosis.   Electronically Signed   By: Oley Balm M.D.   On: 11/30/2012 00:26   Dg Abd Portable 2v  11/30/2012   CLINICAL DATA:  Small-bowel obstruction  EXAM: PORTABLE ABDOMEN - 2 VIEW  COMPARISON:  11/30/2012  FINDINGS:  Contrast material is now noted throughout the colon consistent with a progression from the prior exam. The degree of small bowel dilatation is slightly decreased when compare with the prior exam. No free air is seen. The stomach remains distended.  IMPRESSION: Contrast material is now noted within the colon indicating the obstructive changes likely related to a partial small bowel obstruction. The degree  of dilatation is stable from the prior study.   Electronically Signed   By: Alcide Clever M.D.   On: 11/30/2012 07:23   Dg Basil Dess Tube Plc W/fl-no Rad  11/30/2012   CLINICAL DATA: sbo   NASO G TUBE PLACEMENT WITH FLUORO  Fluoroscopy was utilized by the requesting physician.  No radiographic  interpretation.     Medications: . heparin subcutaneous  5,000 Units Subcutaneous Q8H  . pantoprazole (PROTONIX) IV  40 mg Intravenous QHS  . tiotropium  18 mcg Inhalation Daily   . dextrose 5 % and 0.9 % NaCl with KCl 20 mEq/L 100 mL/hr at 12/01/12 0539   Prior to Admission medications   Medication Sig Start Date End Date Taking? Authorizing Provider  albuterol (PROVENTIL HFA;VENTOLIN HFA) 108 (90 BASE) MCG/ACT inhaler Inhale 2 puffs into the lungs every 6 (six) hours as needed for wheezing.   Yes Historical Provider, MD  alendronate (FOSAMAX) 70 MG tablet Take 70 mg by mouth every 7 (seven) days. Take with a full glass of water on an empty stomach. Take on Tuesday   Yes Historical Provider, MD  aspirin 81 MG chewable tablet Chew 81 mg by mouth daily.   Yes Historical Provider, MD  cholecalciferol (VITAMIN D) 1000 UNITS tablet Take 1,000 Units by mouth daily.   Yes Historical Provider, MD  clobetasol ointment (TEMOVATE) 0.05 % Apply 1 application topically 2 (two) times daily.   Yes Historical Provider, MD  tiotropium (SPIRIVA) 18 MCG inhalation capsule Place 18 mcg into inhaler and inhale daily.   Yes Historical Provider, MD  triamcinolone (KENALOG) 0.025 % cream Apply 1 application topically 2 (two) times daily.   Yes Historical Provider, MD     Assessment/Plan Partial SBO  COPD  H/o hiatal hernia repair (~2011)  H/o Appendectomy  H/o small bowel obstruction x2   Plan:  Continue NG, mobilize, fluids recheck labs tomorrow.   LOS: 2 days    Lonnie Schaefer 12/01/2012

## 2012-12-01 NOTE — Progress Notes (Signed)
Patient reports much more flatus and some liquid bowel movements today. Abd much softer, no distention.  + BS; soft abdomen  Will leave NG until AM, since it was so difficult to place.  If he continues to have bowel function, will d/c/ NG in AM and will begin clears.  Wilmon Arms. Corliss Skains, MD, [facility] Surgery  General/ Trauma Surgery  12/01/2012 1:26 PM

## 2012-12-02 ENCOUNTER — Inpatient Hospital Stay (HOSPITAL_COMMUNITY): Payer: Medicare Other

## 2012-12-02 LAB — BASIC METABOLIC PANEL
BUN: 4 mg/dL — ABNORMAL LOW (ref 6–23)
CO2: 26 mEq/L (ref 19–32)
Calcium: 7.2 mg/dL — ABNORMAL LOW (ref 8.4–10.5)
Chloride: 108 mEq/L (ref 96–112)
Creatinine, Ser: 0.78 mg/dL (ref 0.50–1.35)
GFR calc Af Amer: >90 mL/min (ref >90)
GFR calc non Af Amer: 89 mL/min — ABNORMAL LOW (ref >90)
Glucose, Bld: 106 mg/dL — ABNORMAL HIGH (ref 70–99)
Potassium: 4 mEq/L (ref 3.5–5.1)
Sodium: 139 mEq/L (ref 135–145)

## 2012-12-02 LAB — CBC
HCT: 31.6 % — ABNORMAL LOW (ref 39.0–52.0)
Hemoglobin: 11 g/dL — ABNORMAL LOW (ref 13.0–17.0)
MCH: 31.9 pg (ref 26.0–34.0)
MCHC: 34.8 g/dL (ref 30.0–36.0)
MCV: 91.6 fL (ref 78.0–100.0)
Platelets: 187 10*3/uL (ref 150–400)
RBC: 3.45 MIL/uL — ABNORMAL LOW (ref 4.22–5.81)
RDW: 13.5 % (ref 11.5–15.5)
WBC: 5.1 10*3/uL (ref 4.0–10.5)

## 2012-12-02 NOTE — Progress Notes (Signed)
Subjective: No real improvement.  He says he has had some gas, but drainage from NG shows cannister is almost full, and it's a clear with a white colored sediment in it now. I don't hear much   Objective: Vital signs in last 24 hours: Temp:  [98.3 F (36.8 C)-98.9 F (37.2 C)] 98.5 F (36.9 C) (10/29 0530) Pulse Rate:  [72-87] 82 (10/29 0530) Resp:  [16-20] 20 (10/29 0530) BP: (102-121)/(53-59) 102/53 mmHg (10/29 0530) SpO2:  [96 %-99 %] 98 % (10/29 0530) Last BM Date: 12/01/12 175 from NG. NPO Afebrile, VSS Labs OK  Intake/Output from previous day: 10/28 0701 - 10/29 0700 In: 3203.3 [I.V.:3203.3] Out: 725 [Urine:550; Emesis/NG output:175] Intake/Output this shift: Total I/O In: 100 [I.V.:100] Out: 350 [Urine:350]  General appearance: alert, cooperative and no distress Resp: clear to auscultation bilaterally GI: no bowel sounds still distended, not really tender, just sore  Lab Results:   Recent Labs  11/30/12 0502 12/02/12 0455  WBC 9.4 5.1  HGB 13.2 11.0*  HCT 39.3 31.6*  PLT 231 187    BMET  Recent Labs  11/30/12 0502 12/02/12 0455  NA 139 139  K 4.9 4.0  CL 107 108  CO2 23 26  GLUCOSE 143* 106*  BUN 20 4*  CREATININE 1.22 0.78  CALCIUM 7.6* 7.2*   PT/INR No results found for this basename: LABPROT, INR,  in the last 72 hours   Recent Labs Lab 11/29/12 2155  AST 18  ALT 11  ALKPHOS 64  BILITOT 2.6*  PROT 7.8  ALBUMIN 3.8     Lipase     Component Value Date/Time   LIPASE 17 11/29/2012 2155     Studies/Results: Dg Abd 1 View  11/30/2012   CLINICAL DATA:  14 French NG tube placed under fluoro  EXAM: ABDOMEN - 1 VIEW  COMPARISON:  None.  FLUOROSCOPY TIME:  FLUOROSCOPY TIME 53 seconds.  FINDINGS: An enteric tube was placed under fluoroscopy has been submitted for interpretation.  A single image capture was provided which demonstrates an enteric tube terminating in the distal gastric body.  IMPRESSION: Enteric tube terminates in the  distal gastric body.   Electronically Signed   By: Charline Bills M.D.   On: 11/30/2012 09:13   Dg Vangie Bicker G Tube Plc W/fl-no Rad  11/30/2012   CLINICAL DATA: sbo   NASO G TUBE PLACEMENT WITH FLUORO  Fluoroscopy was utilized by the requesting physician.  No radiographic  interpretation.     Medications: . heparin subcutaneous  5,000 Units Subcutaneous Q8H  . pantoprazole (PROTONIX) IV  40 mg Intravenous QHS  . tiotropium  18 mcg Inhalation Daily   Prior to Admission medications   Medication Sig Start Date End Date Taking? Authorizing Provider  albuterol (PROVENTIL HFA;VENTOLIN HFA) 108 (90 BASE) MCG/ACT inhaler Inhale 2 puffs into the lungs every 6 (six) hours as needed for wheezing.   Yes Historical Provider, MD  alendronate (FOSAMAX) 70 MG tablet Take 70 mg by mouth every 7 (seven) days. Take with a full glass of water on an empty stomach. Take on Tuesday   Yes Historical Provider, MD  aspirin 81 MG chewable tablet Chew 81 mg by mouth daily.   Yes Historical Provider, MD  cholecalciferol (VITAMIN D) 1000 UNITS tablet Take 1,000 Units by mouth daily.   Yes Historical Provider, MD  clobetasol ointment (TEMOVATE) 0.05 % Apply 1 application topically 2 (two) times daily.   Yes Historical Provider, MD  tiotropium (SPIRIVA) 18 MCG inhalation capsule  Place 18 mcg into inhaler and inhale daily.   Yes Historical Provider, MD  triamcinolone (KENALOG) 0.025 % cream Apply 1 application topically 2 (two) times daily.   Yes Historical Provider, MD     Assessment/Plan  SBO  Film: The distended gas-filled loops of small bowel are less conspicuous.  There is a mildly dilated loop of small bowel in the left lower  abdomen. There is oral contrast within the colon. Evidence for  colonic diverticula. Nasogastric tube tip is near the pylorus. No  evidence for free air on the upright view. Difficult to exclude  small pleural effusions.  COPD on O2 at night at home. H/o hiatal hernia repair (~2011)  H/o  Appendectomy  H/o small bowel obstruction x2   Plan:  Check film continue NG .  Keep NPO. Film says it's a little better, Keep NPO and watch for now.  LOS: 3 days    Lonnie Schaefer 12/02/2012

## 2012-12-02 NOTE — Progress Notes (Signed)
Seen and agree Abdomen seems a little less distended and is soft Abdominal xrays look a little better Continue ng and bowel rest for now

## 2012-12-02 NOTE — Progress Notes (Signed)
Pt up ambulating in hallway independently at this time; no needs voiced; will cont. To monitor. 

## 2012-12-02 NOTE — Progress Notes (Signed)
Pt up ambulating in hallway with NT at this time; pt denies pain; steady gait noted; will cont. To monitor.

## 2012-12-02 NOTE — Progress Notes (Signed)
Pt up ambulating at this time; family in room; no complaints; will cont. To monitor.

## 2012-12-03 DIAGNOSIS — J4489 Other specified chronic obstructive pulmonary disease: Secondary | ICD-10-CM

## 2012-12-03 DIAGNOSIS — J449 Chronic obstructive pulmonary disease, unspecified: Secondary | ICD-10-CM

## 2012-12-03 NOTE — Progress Notes (Signed)
  Subjective: Pt reports having 2-3 bms yesterday and 1 more this AM.  +flatus.  Reports abdomen is "sore."  Ambulating in hallways.  Denies n/v.  Denies fever or chills.    Objective: Vital signs in last 24 hours: Temp:  [98.1 F (36.7 C)-98.6 F (37 C)] 98.1 F (36.7 C) (10/30 0551) Pulse Rate:  [65-77] 65 (10/30 0551) Resp:  [16-20] 20 (10/30 0551) BP: (115-144)/(61-88) 115/68 mmHg (10/30 0551) SpO2:  [94 %-98 %] 98 % (10/30 0551) Last BM Date: 24-Dec-2012  Intake/Output from previous day: Dec 25, 2022 0701 - 10/30 0700 In: 2313.3 [I.V.:2313.3] Out: 635 [Urine:350; Emesis/NG output:285] Intake/Output this shift:   PE General appearance: alert, cooperative, appears stated age and no distress Resp: scattered expiratory wheezes Cardio: regular rate and rhythm, S1, S2 normal, no murmur, click, rub or gallop GI: +bs, abodmen is soft, mildly distended.  No evidence of peritonitis.  Extremities: extremities normal, atraumatic, no cyanosis or edema  Lab Results:   Recent Labs  12-24-12 0455  WBC 5.1  HGB 11.0*  HCT 31.6*  PLT 187   BMET  Recent Labs  2012/12/24 0455  NA 139  K 4.0  CL 108  CO2 26  GLUCOSE 106*  BUN 4*  CREATININE 0.78  CALCIUM 7.2*   PT/INR No results found for this basename: LABPROT, INR,  in the last 72 hours ABG No results found for this basename: PHART, PCO2, PO2, HCO3,  in the last 72 hours  Studies/Results: Dg Abd 2 Views  2012/12/24   CLINICAL DATA:  Evaluate for small bowel obstruction.  EXAM: ABDOMEN - 2 VIEW  COMPARISON:  Abdominal CT 11/30/2012 and 11/30/2012  FINDINGS: The distended gas-filled loops of small bowel are less conspicuous. There is a mildly dilated loop of small bowel in the left lower abdomen. There is oral contrast within the colon. Evidence for colonic diverticula. Nasogastric tube tip is near the pylorus. No evidence for free air on the upright view. Difficult to exclude small pleural effusions.  IMPRESSION: Decreased small  bowel distention. Findings suggest an improving or resolving obstructive process.   Electronically Signed   By: Richarda Overlie M.D.   On: 12/24/2012 10:52    Anti-infectives: Anti-infectives   None      Assessment/Plan: SBO(hx hiatal hernia repair, appendectomy, SBO x2 episodes) -resolving, 26ml/24h out of NGT, clamp, if tolerates, DC and start on sips of clears later today -continue with IV hydration -pain control, antiemetics -SCDs, ambulate, heparin  COPD -wheezing on exam, denies SOB, PRN albuterol -Spiriva    LOS: 4 days    Abran Gavigan, [facility] 12/03/2012

## 2012-12-03 NOTE — Progress Notes (Signed)
NG removed Abdomen is soft and non-tender  Will advance diet and possible discharge tomorrow.  Wilmon Arms. Corliss Skains, MD, [facility] Surgery  General/ Trauma Surgery  12/03/2012 3:03 PM

## 2012-12-03 NOTE — ED Provider Notes (Signed)
Medical screening examination/treatment/procedure(s) were performed by non-physician practitioner and as supervising physician I was immediately available for consultation/collaboration.  EKG Interpretation     Ventricular Rate:    PR Interval:    QRS Duration:   QT Interval:    QTC Calculation:   R Axis:     Text Interpretation:                Gwyneth Sprout, MD 12/03/12 1510

## 2012-12-04 DIAGNOSIS — K56609 Unspecified intestinal obstruction, unspecified as to partial versus complete obstruction: Secondary | ICD-10-CM

## 2012-12-04 MED ORDER — POLYETHYLENE GLYCOL 3350 17 G PO PACK
17.0000 g | PACK | Freq: Every day | ORAL | Status: DC
  Administered 2012-12-04: 17 g via ORAL
  Filled 2012-12-04: qty 1

## 2012-12-04 MED ORDER — POLYETHYLENE GLYCOL 3350 17 G PO PACK: 17.0000 g | PACK | Freq: Every day | ORAL | Status: DC

## 2012-12-04 NOTE — Progress Notes (Signed)
Tolerating diet Ready for discharge today.  Lonnie Schaefer. Lonnie Skains, MD, [facility] Surgery  General/ Trauma Surgery  12/04/2012 12:39 PM

## 2012-12-04 NOTE — Discharge Summary (Signed)
Lonnie Schaefer. Corliss Skains, MD, [facility] Surgery  General/ Trauma Surgery  12/04/2012 2:48 PM

## 2012-12-04 NOTE — Progress Notes (Signed)
  Subjective: Pt continues to have bms, +flatus.  Tolerating liquids.  Objective: Vital signs in last 24 hours: Temp:  [97.6 F (36.4 C)-98.3 F (36.8 C)] 97.6 F (36.4 C) (10/31 0624) Pulse Rate:  [68-69] 69 (10/31 0624) Resp:  [16] 16 (10/31 0624) BP: (124-143)/(65-75) 124/65 mmHg (10/31 0624) SpO2:  [69 %-98 %] 98 % (10/31 0624) Last BM Date: 12/03/12  Intake/Output from previous day: 10/30 0701 - 10/31 0700 In: 2620 [P.O.:240; I.V.:2380] Out: -  Intake/Output this shift:   PE  General appearance: alert, cooperative, appears stated age and no distress  Resp: CTA Cardio: regular rate and rhythm, S1, S2 normal, no murmur, click, rub or gallop  GI: +bs, abodmen is soft, non distended. No evidence of peritonitis.  Extremities: extremities normal, atraumatic, no cyanosis or edema   Lab Results:   Recent Labs  December 19, 2012 0455  WBC 5.1  HGB 11.0*  HCT 31.6*  PLT 187   BMET  Recent Labs  Dec 19, 2012 0455  NA 139  K 4.0  CL 108  CO2 26  GLUCOSE 106*  BUN 4*  CREATININE 0.78  CALCIUM 7.2*   PT/INR No results found for this basename: LABPROT, INR,  in the last 72 hours ABG No results found for this basename: PHART, PCO2, PO2, HCO3,  in the last 72 hours  Studies/Results: Dg Abd 2 Views  Dec 19, 2012   CLINICAL DATA:  Evaluate for small bowel obstruction.  EXAM: ABDOMEN - 2 VIEW  COMPARISON:  Abdominal CT 11/30/2012 and 11/30/2012  FINDINGS: The distended gas-filled loops of small bowel are less conspicuous. There is a mildly dilated loop of small bowel in the left lower abdomen. There is oral contrast within the colon. Evidence for colonic diverticula. Nasogastric tube tip is near the pylorus. No evidence for free air on the upright view. Difficult to exclude small pleural effusions.  IMPRESSION: Decreased small bowel distention. Findings suggest an improving or resolving obstructive process.   Electronically Signed   By: Richarda Overlie M.D.   On: 2012/12/19 10:52     Anti-infectives: Anti-infectives   None      Assessment/Plan: SBO(hx hiatal hernia repair, appendectomy, SBO x2 episodes)  -resolving.  Advance diet, start miralax -DC IVF -pain control, antiemetics  -SCDs, ambulate, heparin  COPD  -wheezing on exam, denies SOB, PRN albuterol  -Spiriva  DIspo: anticipate discharge later today    LOS: 5 days    Elsia Lasota ANP-BC 12/04/2012 8:02 AM

## 2012-12-04 NOTE — Discharge Summary (Signed)
Physician Discharge Summary  Lonnie Schaefer QIO:[id number] DOB: [birth year] DOA: 11/29/2012  PCP: Elizabeth Palau, FNP  Consultation: none  Admit date: 11/29/2012 Discharge date: 12/04/2012  Recommendations for Outpatient Follow-up:   Follow-up Information   Follow up with Lonnie Schaefer., MD. (As needed)    Specialty:  General Surgery   Contact information:   [address] [place] [address] [phone]      Discharge Diagnoses:  1. Small bowel obstruction 2. COPD   Surgical Procedure: none  Discharge Condition: stable Disposition: home  Diet recommendation: high fiber  Filed Weights   11/30/12 0332  Weight: 166 lb 10.7 oz (75.6 kg)    1:13 PM    Hospital Course:  Lonnie Schaefer is a 72 year old male with a history of hiatal hernia repair, appendectomy,  SBO x2, COPD who presented to [facility] with abdominal pain.  He was found to be obstructed attributed likely to adhesions.  He was admitted for bowel rest, NGT decompression.  He slowly improved with conservative management.  He began to have bowel movements, his diet was advanced as tolerated and pain improved.  On HD #4 he was tolerating a diet, ambulating, pain had resolved. His vital signs remained stable.  WBC and electrolytes remained stable.  He was therefore felt stable for discharge.  We discussed "good bowel health."  We discussed warning signs that warrant immediate attention.     Discharge Instructions     Medication List         albuterol 108 (90 BASE) MCG/ACT inhaler  Commonly known as:  PROVENTIL HFA;VENTOLIN HFA  Inhale 2 puffs into the lungs every 6 (six) hours as needed for wheezing.     alendronate 70 MG tablet  Commonly known as:  FOSAMAX  Take 70 mg by mouth every 7 (seven) days. Take with a full glass of water on an empty stomach. Take on Tuesday     aspirin 81 MG chewable tablet  Chew 81 mg by mouth daily.     cholecalciferol 1000 UNITS tablet  Commonly known as:  VITAMIN  D  Take 1,000 Units by mouth daily.     clobetasol ointment 0.05 %  Commonly known as:  TEMOVATE  Apply 1 application topically 2 (two) times daily.     polyethylene glycol packet  Commonly known as:  MIRALAX / GLYCOLAX  Take 17 g by mouth daily.     tiotropium 18 MCG inhalation capsule  Commonly known as:  SPIRIVA  Place 18 mcg into inhaler and inhale daily.     triamcinolone 0.025 % cream  Commonly known as:  KENALOG  Apply 1 application topically 2 (two) times daily.           Follow-up Information   Follow up with Lonnie Schaefer., MD. (As needed)    Specialty:  General Surgery   Contact information:   [address] [place] [address] [phone]        The results of significant diagnostics from this hospitalization (including imaging, microbiology, ancillary and laboratory) are listed below for reference.    Significant Diagnostic Studies: Dg Abd 1 View  11/30/2012   CLINICAL DATA:  14 French NG tube placed under fluoro  EXAM: ABDOMEN - 1 VIEW  COMPARISON:  None.  FLUOROSCOPY TIME:  FLUOROSCOPY TIME 53 seconds.  FINDINGS: An enteric tube was placed under fluoroscopy has been submitted for interpretation.  A single image capture was provided which demonstrates an enteric tube terminating in  the distal gastric body.  IMPRESSION: Enteric tube terminates in the distal gastric body.   Electronically Signed   By: Charline Bills M.D.   On: 11/30/2012 09:13   Ct Abdomen Pelvis W Contrast  11/30/2012   CLINICAL DATA:  Abdominal pain and vomiting.  EXAM: CT ABDOMEN AND PELVIS WITH CONTRAST  TECHNIQUE: Multidetector CT imaging of the abdomen and pelvis was performed using the standard protocol following bolus administration of intravenous contrast.  CONTRAST:  OMNIPAQUE IOHEXOL 300 MG/ML  SOLN  COMPARISON:  10/15/2009  FINDINGS: Patchy atelectasis/ consolidation in the visualized lung bases, right greater than left. Unremarkable liver, nondilated  gallbladder, spleen, adrenal glands, pancreas. Stable small left renal cyst. No hydronephrosis. Aortoiliac calcified plaque without aneurysm or stenosis. Portal vein patent. Stomach is distended. Multiple dilated proximal small bowel loops with transition to nondilated mid and distal small bowel loops in the left mid abdomen, without associated mass or abscess. The colon is nondistended. Innumerable descending and sigmoid diverticula without adjacent inflammatory/ edematous change. Urinary bladder is nondistended. Mild prostatic prominence. Left pelvic phleboliths. Trace pelvic ascites. No free air. No adenopathy localized. Degenerative disc disease L5-S1.  IMPRESSION: 1. Mid/distal small bowel obstruction without evident etiology, suggesting adhesions. 2. Descending and sigmoid diverticulosis.   Electronically Signed   By: Oley Balm M.D.   On: 11/30/2012 00:26   Dg Abd 2 Views  12/02/2012   CLINICAL DATA:  Evaluate for small bowel obstruction.  EXAM: ABDOMEN - 2 VIEW  COMPARISON:  Abdominal CT 11/30/2012 and 11/30/2012  FINDINGS: The distended gas-filled loops of small bowel are less conspicuous. There is a mildly dilated loop of small bowel in the left lower abdomen. There is oral contrast within the colon. Evidence for colonic diverticula. Nasogastric tube tip is near the pylorus. No evidence for free air on the upright view. Difficult to exclude small pleural effusions.  IMPRESSION: Decreased small bowel distention. Findings suggest an improving or resolving obstructive process.   Electronically Signed   By: Richarda Overlie M.D.   On: 12/02/2012 10:52   Dg Abd Portable 2v  11/30/2012   CLINICAL DATA:  Small-bowel obstruction  EXAM: PORTABLE ABDOMEN - 2 VIEW  COMPARISON:  11/30/2012  FINDINGS: Contrast material is now noted throughout the colon consistent with a progression from the prior exam. The degree of small bowel dilatation is slightly decreased when compare with the prior exam. No free air is  seen. The stomach remains distended.  IMPRESSION: Contrast material is now noted within the colon indicating the obstructive changes likely related to a partial small bowel obstruction. The degree of dilatation is stable from the prior study.   Electronically Signed   By: Alcide Clever M.D.   On: 11/30/2012 07:23   Dg Basil Dess Tube Plc W/fl-no Rad  11/30/2012   CLINICAL DATA: sbo   NASO G TUBE PLACEMENT WITH FLUORO  Fluoroscopy was utilized by the requesting physician.  No radiographic  interpretation.     Microbiology: No results found for this or any previous visit (from the past 240 hour(s)).   Labs: Basic Metabolic Panel:  Recent Labs Lab 11/29/12 2155 11/30/12 0502 12/02/12 0455  NA 139 139 139  K 4.3 4.9 4.0  CL 102 107 108  CO2 22 23 26   GLUCOSE 192* 143* 106*  BUN 18 20 4*  CREATININE 1.36* 1.22 0.78  CALCIUM 9.2 7.6* 7.2*   Liver Function Tests:  Recent Labs Lab 11/29/12 2155  AST 18  ALT 11  ALKPHOS  64  BILITOT 2.6*  PROT 7.8  ALBUMIN 3.8    Recent Labs Lab 11/29/12 2155  LIPASE 17   No results found for this basename: AMMONIA,  in the last 168 hours CBC:  Recent Labs Lab 11/29/12 2155 11/30/12 0502 12/02/12 0455  WBC 8.1 9.4 5.1  NEUTROABS 6.8  --   --   HGB 16.1 13.2 11.0*  HCT 47.6 39.3 31.6*  MCV 91.7 93.3 91.6  PLT 299 231 187    Active Problems:   Small bowel obstruction   Time coordinating discharge: <30 mins   Signed:  Fredda Clarida, ANP-BC

## 2013-01-06 ENCOUNTER — Other Ambulatory Visit: Payer: Self-pay | Admitting: Otolaryngology

## 2013-01-06 DIAGNOSIS — D34 Benign neoplasm of thyroid gland: Secondary | ICD-10-CM

## 2013-01-11 ENCOUNTER — Ambulatory Visit
Admission: RE | Admit: 2013-01-11 | Discharge: 2013-01-11 | Disposition: A | Discharge status: Home / Self Care | Payer: Medicare Other | Source: Ambulatory Visit | Attending: Otolaryngology | Admitting: Otolaryngology

## 2013-01-11 DIAGNOSIS — D34 Benign neoplasm of thyroid gland: Secondary | ICD-10-CM

## 2013-06-14 ENCOUNTER — Emergency Department (HOSPITAL_COMMUNITY)
Admission: EM | Admit: 2013-06-14 | Discharge: 2013-06-14 | Disposition: A | Discharge status: Home / Self Care | Payer: Medicare Other | Attending: Emergency Medicine | Admitting: Emergency Medicine

## 2013-06-14 ENCOUNTER — Emergency Department (HOSPITAL_COMMUNITY): Payer: Medicare Other

## 2013-06-14 ENCOUNTER — Encounter (HOSPITAL_COMMUNITY): Payer: Self-pay | Admitting: Emergency Medicine

## 2013-06-14 DIAGNOSIS — R1084 Generalized abdominal pain: Secondary | ICD-10-CM | POA: Insufficient documentation

## 2013-06-14 DIAGNOSIS — Z9089 Acquired absence of other organs: Secondary | ICD-10-CM | POA: Insufficient documentation

## 2013-06-14 DIAGNOSIS — Z7982 Long term (current) use of aspirin: Secondary | ICD-10-CM | POA: Insufficient documentation

## 2013-06-14 DIAGNOSIS — Z79899 Other long term (current) drug therapy: Secondary | ICD-10-CM | POA: Insufficient documentation

## 2013-06-14 DIAGNOSIS — D72829 Elevated white blood cell count, unspecified: Secondary | ICD-10-CM | POA: Insufficient documentation

## 2013-06-14 DIAGNOSIS — K5289 Other specified noninfective gastroenteritis and colitis: Secondary | ICD-10-CM | POA: Insufficient documentation

## 2013-06-14 DIAGNOSIS — Z9889 Other specified postprocedural states: Secondary | ICD-10-CM | POA: Insufficient documentation

## 2013-06-14 DIAGNOSIS — J449 Chronic obstructive pulmonary disease, unspecified: Secondary | ICD-10-CM | POA: Insufficient documentation

## 2013-06-14 DIAGNOSIS — Z8701 Personal history of pneumonia (recurrent): Secondary | ICD-10-CM | POA: Insufficient documentation

## 2013-06-14 DIAGNOSIS — K529 Noninfective gastroenteritis and colitis, unspecified: Secondary | ICD-10-CM

## 2013-06-14 DIAGNOSIS — J4489 Other specified chronic obstructive pulmonary disease: Secondary | ICD-10-CM | POA: Insufficient documentation

## 2013-06-14 DIAGNOSIS — Z7983 Long term (current) use of bisphosphonates: Secondary | ICD-10-CM | POA: Insufficient documentation

## 2013-06-14 DIAGNOSIS — Z87891 Personal history of nicotine dependence: Secondary | ICD-10-CM | POA: Insufficient documentation

## 2013-06-14 DIAGNOSIS — IMO0002 Reserved for concepts with insufficient information to code with codable children: Secondary | ICD-10-CM | POA: Insufficient documentation

## 2013-06-14 LAB — COMPREHENSIVE METABOLIC PANEL
ALT: 12 U/L (ref 0–53)
AST: 20 U/L (ref 0–37)
Albumin: 3.8 g/dL (ref 3.5–5.2)
Alkaline Phosphatase: 62 U/L (ref 39–117)
BUN: 15 mg/dL (ref 6–23)
CO2: 22 mEq/L (ref 19–32)
Calcium: 9.2 mg/dL (ref 8.4–10.5)
Chloride: 104 mEq/L (ref 96–112)
Creatinine, Ser: 0.99 mg/dL (ref 0.50–1.35)
GFR calc Af Amer: >90 mL/min (ref >90)
GFR calc non Af Amer: 81 mL/min — ABNORMAL LOW (ref >90)
Glucose, Bld: 109 mg/dL — ABNORMAL HIGH (ref 70–99)
Potassium: 4.2 mEq/L (ref 3.7–5.3)
Sodium: 143 mEq/L (ref 137–147)
Total Bilirubin: 2.1 mg/dL — ABNORMAL HIGH (ref 0.3–1.2)
Total Protein: 7.6 g/dL (ref 6.0–8.3)

## 2013-06-14 LAB — CBC WITH DIFFERENTIAL/PLATELET
Basophils Absolute: 0 10*3/uL (ref 0.0–0.1)
Basophils Relative: 0 % (ref 0–1)
Eosinophils Absolute: 0.2 10*3/uL (ref 0.0–0.7)
Eosinophils Relative: 2 % (ref 0–5)
HCT: 45 % (ref 39.0–52.0)
Hemoglobin: 15.1 g/dL (ref 13.0–17.0)
Lymphocytes Relative: 10 % — ABNORMAL LOW (ref 12–46)
Lymphs Abs: 1.2 10*3/uL (ref 0.7–4.0)
MCH: 31.1 pg (ref 26.0–34.0)
MCHC: 33.6 g/dL (ref 30.0–36.0)
MCV: 92.6 fL (ref 78.0–100.0)
Monocytes Absolute: 0.9 10*3/uL (ref 0.1–1.0)
Monocytes Relative: 8 % (ref 3–12)
Neutro Abs: 9.8 10*3/uL — ABNORMAL HIGH (ref 1.7–7.7)
Neutrophils Relative %: 80 % — ABNORMAL HIGH (ref 43–77)
Platelets: 228 10*3/uL (ref 150–400)
RBC: 4.86 MIL/uL (ref 4.22–5.81)
RDW: 13 % (ref 11.5–15.5)
WBC: 12.1 10*3/uL — ABNORMAL HIGH (ref 4.0–10.5)

## 2013-06-14 LAB — URINE MICROSCOPIC-ADD ON

## 2013-06-14 LAB — URINALYSIS, ROUTINE W REFLEX MICROSCOPIC
Glucose, UA: NEGATIVE mg/dL
Hgb urine dipstick: NEGATIVE
Ketones, ur: >80 mg/dL — AB
Leukocytes, UA: NEGATIVE
Nitrite: NEGATIVE
Protein, ur: 100 mg/dL — AB
Specific Gravity, Urine: 1.03 (ref 1.005–1.030)
Urobilinogen, UA: 1 mg/dL (ref 0.0–1.0)
pH: 5 (ref 5.0–8.0)

## 2013-06-14 LAB — I-STAT CG4 LACTIC ACID, ED: Lactic Acid, Venous: 1.78 mmol/L (ref 0.5–2.2)

## 2013-06-14 LAB — LIPASE, BLOOD: Lipase: 20 U/L (ref 11–59)

## 2013-06-14 MED ORDER — IOHEXOL 300 MG/ML  SOLN
25.0000 mL | INTRAMUSCULAR | Status: AC
  Administered 2013-06-14: 25 mL via ORAL

## 2013-06-14 MED ORDER — ONDANSETRON HCL 4 MG/2ML IJ SOLN
4.0000 mg | Freq: Once | INTRAMUSCULAR | Status: AC
  Administered 2013-06-14: 4 mg via INTRAVENOUS
  Filled 2013-06-14: qty 2

## 2013-06-14 MED ORDER — SODIUM CHLORIDE 0.9 % IV BOLUS (SEPSIS)
1000.0000 mL | Freq: Once | INTRAVENOUS | Status: AC
  Administered 2013-06-14: 1000 mL via INTRAVENOUS

## 2013-06-14 MED ORDER — IOHEXOL 300 MG/ML  SOLN
100.0000 mL | Freq: Once | INTRAMUSCULAR | Status: AC | PRN
  Administered 2013-06-14: 100 mL via INTRAVENOUS

## 2013-06-14 MED ORDER — MORPHINE SULFATE 4 MG/ML IJ SOLN
4.0000 mg | Freq: Once | INTRAMUSCULAR | Status: AC
  Administered 2013-06-14: 4 mg via INTRAVENOUS
  Filled 2013-06-14: qty 1

## 2013-06-14 MED ORDER — ONDANSETRON HCL 4 MG PO TABS: 4.0000 mg | ORAL_TABLET | Freq: Four times a day (QID) | ORAL | Status: DC

## 2013-06-14 MED ORDER — SODIUM CHLORIDE 0.9 % IV BOLUS (SEPSIS)
500.0000 mL | Freq: Once | INTRAVENOUS | Status: AC
  Administered 2013-06-14: 500 mL via INTRAVENOUS

## 2013-06-14 MED ORDER — METRONIDAZOLE 500 MG PO TABS: 500.0000 mg | ORAL_TABLET | Freq: Two times a day (BID) | ORAL | Status: DC

## 2013-06-14 MED ORDER — CIPROFLOXACIN HCL 500 MG PO TABS: 500.0000 mg | ORAL_TABLET | Freq: Two times a day (BID) | ORAL | Status: DC

## 2013-06-14 MED ORDER — HYDROCODONE-ACETAMINOPHEN 5-325 MG PO TABS: 1.0000 | ORAL_TABLET | Freq: Four times a day (QID) | ORAL | Status: DC | PRN

## 2013-06-14 NOTE — ED Notes (Signed)
NOTIFIED DR. HORTON FOR PATIENTS LAB RESULTS OF CG4+LACTIC ACID ,@12 :14 PM ,06/14/2013.

## 2013-06-14 NOTE — ED Notes (Addendum)
Pt bp trending down after 500cc fluid bolus. Also o2 stats dropped to 88% RA. MD made aware. Verbal order to administer additional 1L NS bolus. Pt placed on 2 L [place]. Pt does wear o2 at home at night. Pt still unable to provide urine specimen.

## 2013-06-14 NOTE — ED Notes (Signed)
CT notified that pt is finished with contrast.  

## 2013-06-14 NOTE — ED Notes (Signed)
abd pain and diarrhea  X 3 days had hx of blockage in intestines

## 2013-06-14 NOTE — ED Notes (Signed)
Patient aware urine sample is needed.  

## 2013-06-14 NOTE — Discharge Instructions (Signed)
Diet for Diarrhea, Adult Frequent, runny stools (diarrhea) may be caused or worsened by food or drink. Diarrhea may be relieved by changing your diet. Since diarrhea can last up to 7 days, it is easy for you to lose too much fluid from the body and become dehydrated. Fluids that are lost need to be replaced. Along with a modified diet, make sure you drink enough fluids to keep your urine clear or pale yellow. DIET INSTRUCTIONS  Ensure adequate fluid intake (hydration): have 1 cup (8 oz) of fluid for each diarrhea episode. Avoid fluids that contain simple sugars or sports drinks, fruit juices, whole milk products, and sodas. Your urine should be clear or pale yellow if you are drinking enough fluids. Hydrate with an oral rehydration solution that you can purchase at pharmacies, retail stores, and online. You can prepare an oral rehydration solution at home by mixing the following ingredients together:    tsp table salt.   tsp baking soda.   tsp salt substitute containing potassium chloride.  1  tablespoons sugar.  1 L (34 oz) of water.  Certain foods and beverages may increase the speed at which food moves through the gastrointestinal (GI) tract. These foods and beverages should be avoided and include:  Caffeinated and alcoholic beverages.  High-fiber foods, such as raw fruits and vegetables, nuts, seeds, and whole grain breads and cereals.  Foods and beverages sweetened with sugar alcohols, such as xylitol, sorbitol, and mannitol.  Some foods may be well tolerated and may help thicken stool including:  Starchy foods, such as rice, toast, pasta, low-sugar cereal, oatmeal, grits, baked potatoes, crackers, and bagels.   Bananas.   Applesauce.  Add probiotic-rich foods to help increase healthy bacteria in the GI tract, such as yogurt and fermented milk products. RECOMMENDED FOODS AND BEVERAGES Starches Choose foods with less than 2 g of fiber per serving.  Recommended:  White,  [place], and pita breads, plain rolls, buns, bagels. Plain muffins, matzo. Soda, saltine, or graham crackers. Pretzels, melba toast, zwieback. Cooked cereals made with water: cornmeal, farina, cream cereals. Dry cereals: refined corn, wheat, rice. Potatoes prepared any way without skins, refined macaroni, spaghetti, noodles, refined rice.  Avoid:  Bread, rolls, or crackers made with whole wheat, multi-grains, rye, bran seeds, nuts, or coconut. Corn tortillas or taco shells. Cereals containing whole grains, multi-grains, bran, coconut, nuts, raisins. Cooked or dry oatmeal. Coarse wheat cereals, granola. Cereals advertised as "high-fiber." Potato skins. Whole grain pasta, wild or brown rice. Popcorn. Sweet potatoes, yams. Sweet rolls, doughnuts, waffles, pancakes, sweet breads. Vegetables  Recommended: Strained tomato and vegetable juices. Most well-cooked and canned vegetables without seeds. Fresh: Tender lettuce, cucumber without the skin, cabbage, spinach, bean sprouts.  Avoid: Fresh, cooked, or canned: Artichokes, baked beans, beet greens, broccoli, Brussels sprouts, corn, kale, legumes, peas, sweet potatoes. Cooked: Green or red cabbage, spinach. Avoid large servings of any vegetables because vegetables shrink when cooked, and they contain more fiber per serving than fresh vegetables. Fruit  Recommended: Cooked or canned: Apricots, applesauce, cantaloupe, cherries, fruit cocktail, grapefruit, grapes, kiwi, mandarin oranges, peaches, pears, plums, watermelon. Fresh: Apples without skin, ripe banana, grapes, cantaloupe, cherries, grapefruit, peaches, oranges, plums. Keep servings limited to  cup or 1 piece.  Avoid: Fresh: Apples with skin, apricots, mangoes, pears, raspberries, strawberries. Prune juice, stewed or dried prunes. Dried fruits, raisins, dates. Large servings of all fresh fruits. Protein  Recommended: Ground or well-cooked tender beef, ham, veal, lamb, pork, or poultry. Eggs. Fish,  oysters, shrimp,  lobster, other seafoods. Liver, organ meats.  Avoid: Tough, fibrous meats with gristle. Peanut butter, smooth or chunky. Cheese, nuts, seeds, legumes, dried peas, beans, lentils. Dairy  Recommended: Yogurt, lactose-free milk, kefir, drinkable yogurt, buttermilk, soy milk, or plain hard cheese.  Avoid: Milk, chocolate milk, beverages made with milk, such as milkshakes. Soups  Recommended: Bouillon, broth, or soups made from allowed foods. Any strained soup.  Avoid: Soups made from vegetables that are not allowed, cream or milk-based soups. Desserts and Sweets  Recommended: Sugar-free gelatin, sugar-free frozen ice pops made without sugar alcohol.  Avoid: Plain cakes and cookies, pie made with fruit, pudding, custard, cream pie. Gelatin, fruit, ice, sherbet, frozen ice pops. Ice cream, ice milk without nuts. Plain hard candy, honey, jelly, molasses, syrup, sugar, chocolate syrup, gumdrops, marshmallows. Fats and Oils  Recommended: Limit fats to less than 8 tsp per day.  Avoid: Seeds, nuts, olives, avocados. Margarine, butter, cream, mayonnaise, salad oils, plain salad dressings. Plain gravy, crisp bacon without rind. Beverages  Recommended: Water, decaffeinated teas, oral rehydration solutions, sugar-free beverages not sweetened with sugar alcohols.  Avoid: Fruit juices, caffeinated beverages (coffee, tea, soda), alcohol, sports drinks, or lemon-lime soda. Condiments  Recommended: Ketchup, mustard, horseradish, vinegar, cocoa powder. Spices in moderation: allspice, basil, bay leaves, celery powder or leaves, cinnamon, cumin powder, curry powder, ginger, mace, marjoram, onion or garlic powder, oregano, paprika, parsley flakes, ground pepper, rosemary, sage, savory, tarragon, thyme, turmeric.  Avoid: Coconut, honey. Document Released: 04/13/2003 Document Revised: 10/16/2011 Document Reviewed: 06/07/2011 [facility] Patient Information 2014 [place]. Abdominal Pain,  Adult You were evaluated today for abdominal pain.  It appears you have inflammation/infection of your small intestine.  You will be given antibiotics.  Given your history of bowel obstruction, you need to continue to monitor  Your symptoms closely.  If you have any new or worsening symptoms, you should return immediately.  HOME CARE INSTRUCTIONS  Monitor your abdominal pain for any changes. The following actions may help to alleviate any discomfort you are experiencing:  Only take over-the-counter or prescription medicines as directed by your health care provider.  Do not take laxatives unless directed to do so by your health care provider.  Try a clear liquid diet (broth, tea, or water) as directed by your health care provider. Slowly move to a bland diet as tolerated. SEEK MEDICAL CARE IF:  You have unexplained abdominal pain.  You have abdominal pain associated with nausea or diarrhea.  You have pain when you urinate or have a bowel movement.  You experience abdominal pain that wakes you in the night.  You have abdominal pain that is worsened or improved by eating food.  You have abdominal pain that is worsened with eating fatty foods. SEEK IMMEDIATE MEDICAL CARE IF:   Your pain does not go away within 2 hours.  You have a fever.  You keep throwing up (vomiting).  Your pain is felt only in portions of the abdomen, such as the right side or the left lower portion of the abdomen.  You pass bloody or black tarry stools. MAKE SURE YOU:  Understand these instructions.   Will watch your condition.   Will get help right away if you are not doing well or get worse.  Document Released: 10/31/2004 Document Revised: 11/11/2012 Document Reviewed: 09/30/2012 [facility] Patient Information 2014 [place].

## 2013-06-14 NOTE — ED Provider Notes (Signed)
CSN: [id number]     Arrival date & time 06/14/13  1010 History   First MD Initiated Contact with Patient 06/14/13 1116     Chief Complaint  Patient presents with  . Abdominal Pain  . Diarrhea     (Consider location/radiation/quality/duration/timing/severity/associated sxs/prior Treatment) HPI  This is a 73 year old male with history COPD and small bowel structure presents with diarrhea and abdominal pain. Patient states "I think I'm getting blocked up again." He reports large amount of watery diarrhea that is nonbloody. He states that this is normally house obstruction start out and then he starts vomiting. He endorses nausea without vomiting. He reports diffuse abdominal pain currently 8/10. Nothing makes it better or worse.  Patient reports last obstruction was approximately 6 months ago that was medically managed.  Past Medical History  Diagnosis Date  . Bowel obstruction   . COPD (chronic obstructive pulmonary disease)   . Shortness of breath   . Pneumonia   . H/O hiatal hernia    Past Surgical History  Procedure Laterality Date  . Hernia repair    . Appendectomy     No family history on file. History  Substance Use Topics  . Smoking status: Former Smoker    Types: Cigarettes    Quit date: 12/01/2002  . Smokeless tobacco: Not on file  . Alcohol Use: No    Review of Systems  Constitutional: Negative.  Negative for fever.  Respiratory: Negative.  Negative for chest tightness and shortness of breath.   Cardiovascular: Negative.  Negative for chest pain.  Gastrointestinal: Positive for nausea, abdominal pain and diarrhea. Negative for vomiting, constipation and blood in stool.  Genitourinary: Negative.  Negative for dysuria.  Musculoskeletal: Negative for back pain.  Skin: Negative for rash.  Neurological: Negative for headaches.  All other systems reviewed and are negative.     Allergies  Review of patient's allergies indicates no known allergies.  Home  Medications   Prior to Admission medications   Medication Sig Start Date End Date Taking? Authorizing Provider  albuterol (PROVENTIL HFA;VENTOLIN HFA) 108 (90 BASE) MCG/ACT inhaler Inhale 2 puffs into the lungs every 6 (six) hours as needed for wheezing.   Yes Historical Provider, MD  alendronate (FOSAMAX) 70 MG tablet Take 70 mg by mouth every 7 (seven) days. Take with a full glass of water on an empty stomach. Take on Tuesday   Yes Historical Provider, MD  aspirin 81 MG chewable tablet Chew 81 mg by mouth daily.   Yes Historical Provider, MD  Calcium Carb-Cholecalciferol 600-400 MG-UNIT TABS Take 1 tablet by mouth 2 (two) times daily.   Yes Historical Provider, MD  cholecalciferol (VITAMIN D) 1000 UNITS tablet Take 1,000 Units by mouth daily.   Yes Historical Provider, MD  clobetasol ointment (TEMOVATE) 7.03 % Apply 1 application topically 2 (two) times daily as needed (dry hands).    Yes Historical Provider, MD  polyethylene glycol (MIRALAX / GLYCOLAX) packet Take 17 g by mouth daily. 12/04/12  Yes Emina Riebock, NP  tiotropium (SPIRIVA) 18 MCG inhalation capsule Place 18 mcg into inhaler and inhale daily.   Yes Historical Provider, MD  triamcinolone (KENALOG) 0.025 % cream Apply 1 application topically 2 (two) times daily.   Yes Historical Provider, MD   BP 118/61  Pulse 93  Temp(Src) 97.9 F (36.6 C)  Resp 18  SpO2 100% Physical Exam  Nursing note and vitals reviewed. Constitutional: He is oriented to person, place, and time. He appears well-developed and well-nourished. No distress.  HENT:  Head: Normocephalic and atraumatic.  Cardiovascular: Normal rate, regular rhythm and normal heart sounds.   No murmur heard. Pulmonary/Chest: Effort normal and breath sounds normal. No respiratory distress. He has no wheezes.  Abdominal: Soft. He exhibits distension. There is tenderness. There is no rebound and no guarding.  Mildly distended, hyperactive bowel sounds  Musculoskeletal: He  exhibits no edema.  Lymphadenopathy:    He has no cervical adenopathy.  Neurological: He is alert and oriented to person, place, and time.  Skin: Skin is warm and dry.  Psychiatric: He has a normal mood and affect.    ED Course  Procedures (including critical care time) Labs Review Labs Reviewed  CBC WITH DIFFERENTIAL - Abnormal; Notable for the following:    WBC 12.1 (*)    Neutrophils Relative % 80 (*)    Neutro Abs 9.8 (*)    Lymphocytes Relative 10 (*)    All other components within normal limits  COMPREHENSIVE METABOLIC PANEL - Abnormal; Notable for the following:    Glucose, Bld 109 (*)    Total Bilirubin 2.1 (*)    GFR calc non Af Amer 81 (*)    All other components within normal limits  URINALYSIS, ROUTINE W REFLEX MICROSCOPIC - Abnormal; Notable for the following:    Color, Urine AMBER (*)    APPearance HAZY (*)    Bilirubin Urine MODERATE (*)    Ketones, ur >80 (*)    Protein, ur 100 (*)    All other components within normal limits  URINE MICROSCOPIC-ADD ON - Abnormal; Notable for the following:    Squamous Epithelial / LPF FEW (*)    Bacteria, UA FEW (*)    Casts GRANULAR CAST (*)    All other components within normal limits  LIPASE, BLOOD  I-STAT CG4 LACTIC ACID, ED    Imaging Review Dg Abd 1 View  06/14/2013   CLINICAL DATA:  Abdominal pain and diarrhea.  EXAM: ABDOMEN - 1 VIEW  COMPARISON:  12/02/2012  FINDINGS: A supine view the abdomen shows mild gaseous distention of small bowel left abdomen measuring up to 4.2 cm in diameter. No other dilated gas-filled small bowel is evident. There is gas scattered along the nondistended colon. Phleboliths overlie the inferior anatomic pelvis. Degenerative changes are noted in the lower lumbar spine.  IMPRESSION: Single loop of mildly distended small bowel in the left abdomen is nonspecific. This may be related to normal peristalsis, but gastroenteritis, inflammatory ileus or early obstruction could have this appearance.    Electronically Signed   By: Misty Stanley M.D.   On: 06/14/2013 12:24   Ct Abdomen Pelvis W Contrast  06/14/2013   CLINICAL DATA:  Lower abdominal pain, history of bowel obstruction, COPD  EXAM: CT ABDOMEN AND PELVIS WITH CONTRAST  TECHNIQUE: Multidetector CT imaging of the abdomen and pelvis was performed using the standard protocol following bolus administration of intravenous contrast. Sagittal and coronal MPR images reconstructed from axial data set.  CONTRAST:  157mL OMNIPAQUE IOHEXOL 300 MG/ML SOLN IV. Dilute oral contrast.  COMPARISON:  11/30/2012  FINDINGS: Atelectasis at lung bases.  11 x 10 mm LEFT renal cyst image 27 series 6 unchanged.  Liver, spleen, pancreas, kidneys, and adrenal glands otherwise normal appearance.  Scattered atherosclerotic calcifications aorta, iliac arteries, LEFT renal artery, and coronary arteries.  Small supraumbilical and umbilical ventral hernias containing fat.  Descending and sigmoid colonic diverticulosis without evidence of diverticulitis.  Thickening of small bowel loops in the mid abdomen compatible with  enteritis.  Mild associated mesenteric edema and minimal free pelvic fluid.  Stomach and remaining bowel loops normal appearance.  No mass, adenopathy, or free air.  Bones unremarkable.  IMPRESSION: Thickened small bowel loops in the mid abdomen compatible with enteritis; differential diagnosis includes infection, inflammatory bowel disease, and ischemia.  No definite evidence of bowel obstruction or perforation identified.  Scattered atherosclerotic changes.  Distal colonic diverticulosis without evidence of diverticulitis.  Small supraumbilical and umbilical hernias containing fat.  Findings called to Dr. Dina Rich on 06/14/2013 at 1444 hours.   Electronically Signed   By: Lavonia Dana M.D.   On: 06/14/2013 14:46     EKG Interpretation None      MDM   Final diagnoses:  Enteritis    Patient presents with abdominal pain and diarrhea. Reports similar episodes  when he has had prior small bowel obstructions.  He is nontoxic on exam. Initial vital signs notable for tachycardia to 113. Patient does appear slightly dehydrated. No evidence of peritonitis on exam. He does have hyperactive bowel sounds and mild distention. Basic labwork was obtained.  Patient was given fluids and pain medication. KUB concerning for dilated loop of bowel. Patient will be sent for CTA. Lactate is within normal limits.  Patient does have mild leukocytosis and evidence of ketones in his urine. CT scan shows thickened small bowel loops compatible with enteritis. Patient has not had any vomiting and given no evidence of clinical obstruction, will by mouth challenge. Repeat abdominal exam reassuring without signs of peritonitis. Patient does continue to have mild distention.  Patient was able to tolerate fluids by mouth. BP 38-182 systolic which appears at patient's baseline per chart review.  He will be discharged home with Cipro and Flagyl given concerned for infectious enteritis. Discuss with patient at length that given this is similar to prior early episodes of small bowel obstruction, he needs to keep a close eye on his symptoms and should return immediately if he has any new or worsening symptoms.    After history, exam, and medical workup I feel the patient has been appropriately medically screened and is safe for discharge home. Pertinent diagnoses were discussed with the patient. Patient was given return precautions.     Merryl Hacker, MD 06/14/13 1535

## 2013-06-14 NOTE — ED Notes (Signed)
Pt returned from xray

## 2013-06-14 NOTE — ED Notes (Signed)
Pt tolerated PO fluids and crackers

## 2013-07-26 ENCOUNTER — Other Ambulatory Visit: Payer: Self-pay | Admitting: Otolaryngology

## 2013-10-01 ENCOUNTER — Emergency Department (HOSPITAL_COMMUNITY): Payer: Medicare Other

## 2013-10-01 ENCOUNTER — Encounter (HOSPITAL_COMMUNITY): Payer: Self-pay | Admitting: Emergency Medicine

## 2013-10-01 ENCOUNTER — Inpatient Hospital Stay (HOSPITAL_COMMUNITY)
Admission: EM | Admit: 2013-10-01 | Discharge: 2013-10-10 | DRG: 392 | Disposition: A | Discharge status: Home / Self Care | Payer: Medicare Other | Attending: Surgery | Admitting: Surgery

## 2013-10-01 DIAGNOSIS — J4489 Other specified chronic obstructive pulmonary disease: Secondary | ICD-10-CM | POA: Diagnosis present

## 2013-10-01 DIAGNOSIS — E860 Dehydration: Secondary | ICD-10-CM | POA: Diagnosis present

## 2013-10-01 DIAGNOSIS — N289 Disorder of kidney and ureter, unspecified: Secondary | ICD-10-CM | POA: Diagnosis present

## 2013-10-01 DIAGNOSIS — Z87891 Personal history of nicotine dependence: Secondary | ICD-10-CM | POA: Diagnosis not present

## 2013-10-01 DIAGNOSIS — K5989 Other specified functional intestinal disorders: Secondary | ICD-10-CM | POA: Diagnosis present

## 2013-10-01 DIAGNOSIS — R109 Unspecified abdominal pain: Secondary | ICD-10-CM | POA: Diagnosis present

## 2013-10-01 DIAGNOSIS — J449 Chronic obstructive pulmonary disease, unspecified: Secondary | ICD-10-CM | POA: Diagnosis present

## 2013-10-01 DIAGNOSIS — K56609 Unspecified intestinal obstruction, unspecified as to partial versus complete obstruction: Secondary | ICD-10-CM

## 2013-10-01 DIAGNOSIS — I959 Hypotension, unspecified: Secondary | ICD-10-CM

## 2013-10-01 DIAGNOSIS — E46 Unspecified protein-calorie malnutrition: Secondary | ICD-10-CM | POA: Diagnosis present

## 2013-10-01 DIAGNOSIS — R197 Diarrhea, unspecified: Secondary | ICD-10-CM

## 2013-10-01 DIAGNOSIS — Z9981 Dependence on supplemental oxygen: Secondary | ICD-10-CM

## 2013-10-01 DIAGNOSIS — R112 Nausea with vomiting, unspecified: Secondary | ICD-10-CM

## 2013-10-01 DIAGNOSIS — K598 Other specified functional intestinal disorders: Principal | ICD-10-CM

## 2013-10-01 DIAGNOSIS — N179 Acute kidney failure, unspecified: Secondary | ICD-10-CM

## 2013-10-01 HISTORY — DX: Dependence on supplemental oxygen: Z99.81

## 2013-10-01 LAB — CBC WITH DIFFERENTIAL/PLATELET
Basophils Absolute: 0 10*3/uL (ref 0.0–0.1)
Basophils Relative: 0 % (ref 0–1)
Eosinophils Absolute: 0 10*3/uL (ref 0.0–0.7)
Eosinophils Relative: 0 % (ref 0–5)
HCT: 45.8 % (ref 39.0–52.0)
Hemoglobin: 15.8 g/dL (ref 13.0–17.0)
Lymphocytes Relative: 8 % — ABNORMAL LOW (ref 12–46)
Lymphs Abs: 1 10*3/uL (ref 0.7–4.0)
MCH: 31.3 pg (ref 26.0–34.0)
MCHC: 34.5 g/dL (ref 30.0–36.0)
MCV: 90.9 fL (ref 78.0–100.0)
Monocytes Absolute: 1.3 10*3/uL — ABNORMAL HIGH (ref 0.1–1.0)
Monocytes Relative: 10 % (ref 3–12)
Neutro Abs: 10.9 10*3/uL — ABNORMAL HIGH (ref 1.7–7.7)
Neutrophils Relative %: 82 % — ABNORMAL HIGH (ref 43–77)
Platelets: 277 10*3/uL (ref 150–400)
RBC: 5.04 MIL/uL (ref 4.22–5.81)
RDW: 13.4 % (ref 11.5–15.5)
WBC: 13.2 10*3/uL — ABNORMAL HIGH (ref 4.0–10.5)

## 2013-10-01 LAB — COMPREHENSIVE METABOLIC PANEL
ALT: 12 U/L (ref 0–53)
AST: 21 U/L (ref 0–37)
Albumin: 3.5 g/dL (ref 3.5–5.2)
Alkaline Phosphatase: 55 U/L (ref 39–117)
Anion gap: 18 — ABNORMAL HIGH (ref 5–15)
BUN: 26 mg/dL — ABNORMAL HIGH (ref 6–23)
CO2: 22 mEq/L (ref 19–32)
Calcium: 9.1 mg/dL (ref 8.4–10.5)
Chloride: 101 mEq/L (ref 96–112)
Creatinine, Ser: 1.8 mg/dL — ABNORMAL HIGH (ref 0.50–1.35)
GFR calc Af Amer: 42 mL/min — ABNORMAL LOW (ref >90)
GFR calc non Af Amer: 36 mL/min — ABNORMAL LOW (ref >90)
Glucose, Bld: 143 mg/dL — ABNORMAL HIGH (ref 70–99)
Potassium: 4.5 mEq/L (ref 3.7–5.3)
Sodium: 141 mEq/L (ref 137–147)
Total Bilirubin: 3.1 mg/dL — ABNORMAL HIGH (ref 0.3–1.2)
Total Protein: 7.2 g/dL (ref 6.0–8.3)

## 2013-10-01 LAB — URINALYSIS, ROUTINE W REFLEX MICROSCOPIC
Glucose, UA: NEGATIVE mg/dL
Hgb urine dipstick: NEGATIVE
Ketones, ur: 15 mg/dL — AB
Leukocytes, UA: NEGATIVE
Nitrite: NEGATIVE
Protein, ur: NEGATIVE mg/dL
Specific Gravity, Urine: 1.03 (ref 1.005–1.030)
Urobilinogen, UA: 0.2 mg/dL (ref 0.0–1.0)
pH: 5 (ref 5.0–8.0)

## 2013-10-01 LAB — LIPASE, BLOOD: Lipase: 11 U/L (ref 11–59)

## 2013-10-01 MED ORDER — LIDOCAINE HCL 2 % EX GEL
1.0000 "application " | Freq: Once | CUTANEOUS | Status: AC
  Administered 2013-10-01: 15:00:00 via TOPICAL
  Filled 2013-10-01: qty 20

## 2013-10-01 MED ORDER — ACETAMINOPHEN 325 MG PO TABS: 650.0000 mg | ORAL_TABLET | Freq: Four times a day (QID) | ORAL | Status: DC | PRN

## 2013-10-01 MED ORDER — ALBUTEROL SULFATE (2.5 MG/3ML) 0.083% IN NEBU: 2.5000 mg | INHALATION_SOLUTION | Freq: Four times a day (QID) | RESPIRATORY_TRACT | Status: DC | PRN

## 2013-10-01 MED ORDER — DEXTROSE-NACL 5-0.45 % IV SOLN
INTRAVENOUS | Status: AC
  Administered 2013-10-01 – 2013-10-02 (×2): via INTRAVENOUS
  Administered 2013-10-02: 125 mL/h via INTRAVENOUS
  Administered 2013-10-02 – 2013-10-05 (×5): via INTRAVENOUS

## 2013-10-01 MED ORDER — TIOTROPIUM BROMIDE MONOHYDRATE 18 MCG IN CAPS
18.0000 ug | ORAL_CAPSULE | Freq: Every day | RESPIRATORY_TRACT | Status: DC
  Administered 2013-10-02 – 2013-10-09 (×7): 18 ug via RESPIRATORY_TRACT
  Filled 2013-10-01 (×3): qty 5

## 2013-10-01 MED ORDER — ENOXAPARIN SODIUM 40 MG/0.4ML ~~LOC~~ SOLN
40.0000 mg | SUBCUTANEOUS | Status: DC
  Administered 2013-10-01 – 2013-10-09 (×9): 40 mg via SUBCUTANEOUS
  Filled 2013-10-01 (×11): qty 0.4

## 2013-10-01 MED ORDER — ONDANSETRON HCL 4 MG/2ML IJ SOLN
4.0000 mg | Freq: Once | INTRAMUSCULAR | Status: AC
  Administered 2013-10-01: 4 mg via INTRAVENOUS
  Filled 2013-10-01: qty 2

## 2013-10-01 MED ORDER — FENTANYL CITRATE 0.05 MG/ML IJ SOLN
50.0000 ug | Freq: Once | INTRAMUSCULAR | Status: AC
  Administered 2013-10-01: 50 ug via INTRAVENOUS
  Filled 2013-10-01: qty 2

## 2013-10-01 MED ORDER — DIPHENHYDRAMINE HCL 50 MG/ML IJ SOLN
25.0000 mg | Freq: Once | INTRAMUSCULAR | Status: AC
  Administered 2013-10-01: 25 mg via INTRAVENOUS
  Filled 2013-10-01: qty 1

## 2013-10-01 MED ORDER — ONDANSETRON HCL 4 MG/2ML IJ SOLN
4.0000 mg | Freq: Four times a day (QID) | INTRAMUSCULAR | Status: DC | PRN
Start: 2013-10-01 — End: 2013-10-10
  Administered 2013-10-02 – 2013-10-04 (×3): 4 mg via INTRAVENOUS
  Filled 2013-10-01 (×3): qty 2

## 2013-10-01 MED ORDER — SODIUM CHLORIDE 0.9 % IV SOLN
1000.0000 mL | INTRAVENOUS | Status: DC
  Administered 2013-10-01: 1000 mL via INTRAVENOUS

## 2013-10-01 MED ORDER — IOHEXOL 300 MG/ML  SOLN
25.0000 mL | INTRAMUSCULAR | Status: AC
  Administered 2013-10-01: 25 mL via ORAL

## 2013-10-01 MED ORDER — ACETAMINOPHEN 650 MG RE SUPP
650.0000 mg | Freq: Four times a day (QID) | RECTAL | Status: DC | PRN
  Administered 2013-10-03: 650 mg via RECTAL
  Filled 2013-10-01: qty 1

## 2013-10-01 MED ORDER — ALBUTEROL SULFATE HFA 108 (90 BASE) MCG/ACT IN AERS: 1.0000 | INHALATION_SPRAY | Freq: Four times a day (QID) | RESPIRATORY_TRACT | Status: DC | PRN

## 2013-10-01 MED ORDER — DIPHENHYDRAMINE HCL 50 MG/ML IJ SOLN
12.5000 mg | Freq: Four times a day (QID) | INTRAMUSCULAR | Status: DC | PRN
  Administered 2013-10-02: 12.5 mg via INTRAVENOUS
  Administered 2013-10-07 – 2013-10-08 (×2): 25 mg via INTRAVENOUS
  Filled 2013-10-01 (×3): qty 1

## 2013-10-01 MED ORDER — PANTOPRAZOLE SODIUM 40 MG IV SOLR
40.0000 mg | Freq: Every day | INTRAVENOUS | Status: DC
  Administered 2013-10-01 – 2013-10-09 (×9): 40 mg via INTRAVENOUS
  Filled 2013-10-01 (×15): qty 40

## 2013-10-01 MED ORDER — MORPHINE SULFATE 2 MG/ML IJ SOLN
1.0000 mg | INTRAMUSCULAR | Status: DC | PRN
  Administered 2013-10-01 (×2): 4 mg via INTRAVENOUS
  Administered 2013-10-02 – 2013-10-05 (×10): 2 mg via INTRAVENOUS
  Filled 2013-10-01: qty 1
  Filled 2013-10-01: qty 2
  Filled 2013-10-01 (×2): qty 1
  Filled 2013-10-01: qty 2
  Filled 2013-10-01 (×7): qty 1

## 2013-10-01 MED ORDER — DIPHENHYDRAMINE HCL 12.5 MG/5ML PO ELIX
12.5000 mg | ORAL_SOLUTION | Freq: Four times a day (QID) | ORAL | Status: DC | PRN
  Administered 2013-10-09: 25 mg via ORAL
  Filled 2013-10-01: qty 10

## 2013-10-01 NOTE — ED Provider Notes (Signed)
CSN: [id number]     Arrival date & time 10/01/13  4098 History   First MD Initiated Contact with Patient 10/01/13 1017     Chief Complaint  Patient presents with  . Hypotension  . Abdominal Pain  . Emesis     HPI Patient presents with abdominal pain.  Pain began he 2 days ago, without clear precipitant.  Since onset has been more severe.  It is midline, sharp, nonradiating.  There is associated nausea, vomiting, by mouth intolerance.  Patient has had episodes of loose stool, though decreased stool production. Patient denies concurrent chest pain, dyspnea.  She does endorse lightheadedness, denies syncope. Patient has a history of bowel obstruction x2.    Past Medical History  Diagnosis Date  . Bowel obstruction   . COPD (chronic obstructive pulmonary disease)   . Shortness of breath   . Pneumonia   . H/O hiatal hernia    Past Surgical History  Procedure Laterality Date  . Hernia repair    . Appendectomy     History reviewed. No pertinent family history. History  Substance Use Topics  . Smoking status: Former Smoker    Types: Cigarettes    Quit date: 12/01/2002  . Smokeless tobacco: Not on file  . Alcohol Use: No    Review of Systems  Constitutional:       Per HPI, otherwise negative  HENT:       Per HPI, otherwise negative  Respiratory:       Per HPI, otherwise negative  Cardiovascular:       Per HPI, otherwise negative  Gastrointestinal: Negative for vomiting.  Endocrine:       Negative aside from HPI  Genitourinary:       Neg aside from HPI   Musculoskeletal:       Per HPI, otherwise negative  Skin: Negative.   Neurological: Negative for syncope.      Allergies  Review of patient's allergies indicates no known allergies.  Home Medications   Prior to Admission medications   Medication Sig Start Date End Date Taking? Authorizing Provider  albuterol (PROVENTIL HFA;VENTOLIN HFA) 108 (90 BASE) MCG/ACT inhaler Inhale 1 puff into the lungs every 6 (six)  hours as needed for wheezing.    Yes Historical Provider, MD  alendronate (FOSAMAX) 70 MG tablet Take 70 mg by mouth every 7 (seven) days. Take with a full glass of water on an empty stomach. Take on Tuesday   Yes Historical Provider, MD  aspirin 81 MG chewable tablet Chew 81 mg by mouth daily.   Yes Historical Provider, MD  Calcium Carb-Cholecalciferol 600-400 MG-UNIT TABS Take 1 tablet by mouth daily.    Yes Historical Provider, MD  cholecalciferol (VITAMIN D) 1000 UNITS tablet Take 1,000 Units by mouth daily.   Yes Historical Provider, MD  clobetasol ointment (TEMOVATE) 1.19 % Apply 1 application topically 2 (two) times daily as needed (dry hands).    Yes Historical Provider, MD  OXYGEN-HELIUM IN Inhale 2 each into the lungs See admin instructions. Inhale 2 liters at bedtime   Yes Historical Provider, MD  polyethylene glycol (MIRALAX / GLYCOLAX) packet Take 17 g by mouth daily. 12/04/12  Yes Emina Riebock, NP  tiotropium (SPIRIVA) 18 MCG inhalation capsule Place 18 mcg into inhaler and inhale daily.   Yes Historical Provider, MD   BP 81/58  Pulse 76  Temp(Src) 97.6 F (36.4 C) (Oral)  Resp 22  SpO2 95% Physical Exam  Nursing note and vitals reviewed. Constitutional: He  is oriented to person, place, and time. He appears well-developed. No distress.  HENT:  Head: Normocephalic and atraumatic.  Eyes: Conjunctivae and EOM are normal.  Cardiovascular: Normal rate and regular rhythm.   Pulmonary/Chest: Effort normal. No stridor. No respiratory distress.  Abdominal: He exhibits no distension. There is no hepatosplenomegaly. There is tenderness in the epigastric area and periumbilical area. There is guarding. There is no rigidity.  Musculoskeletal: He exhibits no edema.  Neurological: He is alert and oriented to person, place, and time.  Skin: Skin is warm and dry.  Psychiatric: He has a normal mood and affect.    ED Course  Procedures (including critical care time) Labs Review Labs  Reviewed  CBC WITH DIFFERENTIAL - Abnormal; Notable for the following:    WBC 13.2 (*)    Neutrophils Relative % 82 (*)    Neutro Abs 10.9 (*)    Lymphocytes Relative 8 (*)    Monocytes Absolute 1.3 (*)    All other components within normal limits  COMPREHENSIVE METABOLIC PANEL - Abnormal; Notable for the following:    Glucose, Bld 143 (*)    BUN 26 (*)    Creatinine, Ser 1.80 (*)    Total Bilirubin 3.1 (*)    GFR calc non Af Amer 36 (*)    GFR calc Af Amer 42 (*)    Anion gap 18 (*)    All other components within normal limits  URINALYSIS, ROUTINE W REFLEX MICROSCOPIC - Abnormal; Notable for the following:    Color, Urine AMBER (*)    APPearance HAZY (*)    Bilirubin Urine SMALL (*)    Ketones, ur 15 (*)    All other components within normal limits  LIPASE, BLOOD    Imaging Review Ct Abdomen Pelvis Wo Contrast  10/01/2013   CLINICAL DATA:  Abdominal pain with nausea and vomiting. History of small bowel obstruction.  EXAM: CT ABDOMEN AND PELVIS WITHOUT CONTRAST  TECHNIQUE: Multidetector CT imaging of the abdomen and pelvis was performed following the standard protocol without IV contrast.  COMPARISON:  06/14/2013.  FINDINGS: There is dilation of the proximal and mid small bowel. The more distal ileum is decompressed. There is a short but gradual transition from mildly dilated ileum to decompressed ileum in the right pelvis. Findings suggest a low grade partial small bowel obstruction.  There is no bowel wall thickening or inflammation. The colon shows air-fluid levels multiple diverticula, but no wall thickening or inflammatory changes. No colonic dilation. There are numerous colonic diverticula without evidence of diverticulitis.  No free air.  There is opacity at the right posterior lung base similar to the prior exam, most likely atelectasis. Milder atelectasis at the left lung base, also stable. Heart is normal in size.  Liver, spleen, gallbladder, pancreas, adrenal glands:  Normal.   9 mm low-density lesion arises from the posterior mid to lower pole of the left kidney, stable consistent with a cyst. No other renal masses, no stones and no hydronephrosis. Normal ureters and bladder.  No pathologically enlarged lymph nodes. No abnormal fluid collections.  Atherosclerotic plaque is noted throughout the aorta.  No aneurysm.  Degenerative changes are noted of the visualized spine. No osteoblastic or osteolytic lesions.  IMPRESSION: 1. Mild dilation of the small bowel most evident of the jejunum with milder dilation of the ileum. There is a short gradual transition to decompressed distal ileum in the right pelvis. Findings most consistent with a low grade partial small bowel obstruction. 2. No bowel  wall thickening or adjacent inflammation. 3. No other acute finding. 4. Right greater than left lung base atelectasis similar to the prior CT. 5. Numerous left colon diverticula.  No diverticulitis.   Electronically Signed   By: Lajean Manes M.D.   On: 10/01/2013 13:39     EKG Interpretation   Date/Time:  Friday October 01 2013 10:33:30 EDT Ventricular Rate:  97 PR Interval:  158 QRS Duration: 79 QT Interval:  333 QTC Calculation: 423 R Axis:   69 Text Interpretation:  Sinus rhythm Sinus rhythm Artifact Abnormal ekg  Confirmed by Carmin Muskrat  MD ([id number]) on 10/01/2013 1:20:19 PM     Given the patient's initial hypotension, he received immediate resuscitation with fluids.   Update: On exam the patient's blood pressure has improved, 95 systolic, following initial resuscitation. Patient continues to have pain, nausea.  Update: As return of CT scan results, discussed the patient's case with our surgical team. On exam the patient continues to have pain, though this is diminished, blood pressure has improved to greater than 696 systolic.  Labs notable for acute renal injury.   MDM  Patient presents with abdominal pain, nausea, vomiting, by mouth intolerance, weakness. If the  patient's hypotensive, tachycardic, weak-appearing.  The patient has evidence of acute kidney injury as well as partial small bowel obstruction. Patient received immediate fluid resuscitation, analgesia, antiemetics, and after blood pressure improved and her vital signs stabilized in general, the patient required admission for further evaluation, management of his bowel obstruction.   CRITICAL CARE Performed by: Carmin Muskrat Total critical care time: 35 Critical care time was exclusive of separately billable procedures and treating other patients. Critical care was necessary to treat or prevent imminent or life-threatening deterioration. Critical care was time spent personally by me on the following activities: development of treatment plan with patient and/or surrogate as well as nursing, discussions with consultants, evaluation of patient's response to treatment, examination of patient, obtaining history from patient or surrogate, ordering and performing treatments and interventions, ordering and review of laboratory studies, ordering and review of radiographic studies, pulse oximetry and re-evaluation of patient's condition.     Carmin Muskrat, MD 10/01/13 1524

## 2013-10-01 NOTE — ED Notes (Signed)
Pt c/o abd pain with N/V/D x 2 days with hx of SBO; pt noted to be hypotension; pt sts dizziness upon standing

## 2013-10-01 NOTE — ED Notes (Signed)
Attempted to call report

## 2013-10-01 NOTE — ED Notes (Signed)
Pt states he has had 2 ng tubes prior and each time it has been inserted by fluoroscopy dr. Durel Salts informed

## 2013-10-01 NOTE — ED Notes (Signed)
Attempted to insert ng via rt nare with lido as ordered unable to pass after approx 4 inches unable to pass via lt nare provider informed

## 2013-10-01 NOTE — Progress Notes (Signed)
Admission nurse called.

## 2013-10-01 NOTE — H&P (Signed)
Lonnie Schaefer 1940/03/19  [id number].   Chief Complaint/Reason for Consult: bowel obstruction HPI: This is a 73 yo white male with a h/o bowel obstruction, COPD, who began having abdominal pain on Wednesday along with diarrhea.  His diarrhea has persisted and is still having it today.  He hasn't had much to eat, just applesauce last night.  Before that, he had been eating well.  Today he started having emesis around 4am.  He denies any fevers or chills.  He just had a colonoscopy about a month ago with Dr. Oletta Lamas which was normal according to the patient.  He hasn't had an EGD in about 8 years.  He states he has had several episodes similar to this one in the last several months.  Each time having the same symptoms of N/V/D with bowel dilatation.    He came to the [facility] today for evaluation secondary to pain, nausea, vomiting, and diarrhea.  He had a CT scan that showed small bowel dilatation with a gradual transition to decompressed distal ileum.  This was felt to be a low grade partial obstruction.  We have been asked to see the patient for further evaluation.  ROS : Please see HPI, otherwise all other systems are negative except for some slight dizziness this morning.  History reviewed. No pertinent family history. Denies any family hx of IBD, gastrointestinal malignancy.  Past Medical History  Diagnosis Date  . Bowel obstruction   . COPD (chronic obstructive pulmonary disease)   . Shortness of breath   . Pneumonia   . H/O hiatal hernia     Past Surgical History  Procedure Laterality Date  . Hernia repair    . Appendectomy      Social History:  reports that he quit smoking about 10 years ago. His smoking use included Cigarettes. He smoked 0.00 packs per day. He does not have any smokeless tobacco history on file. He reports that he does not drink alcohol or use illicit drugs.  Allergies: No Known Allergies   (Not in a hospital admission)  Blood pressure 86/64, pulse 75,  temperature 97.6 F (36.4 C), temperature source Oral, resp. rate 19, SpO2 96.00%. Physical Exam: General: pleasant, WD, WN white male who is laying in bed in NAD HEENT: head is normocephalic, atraumatic.  Sclera are noninjected.  PERRL.  Ears and nose without any masses or lesions.  Mouth is pink and moist Heart: regular, rate, and rhythm.  Normal s1,s2. No obvious murmurs, gallops, or rubs noted.  Palpable radial and pedal pulses bilaterally Lungs: CTAB, no wheezes, rhonchi, or rales noted.  Respiratory effort nonlabored Abd: soft, distended, tender to palpation in left mid and upper abdomen, +BS, no masses, hernias, or organomegaly MS: all 4 extremities are symmetrical with no cyanosis, clubbing, or edema. Skin: warm and dry with no masses, lesions, or rashes Psych: A&Ox3 with an appropriate affect.    Results for orders placed during the hospital encounter of 10/01/13 (from the past 48 hour(s))  CBC WITH DIFFERENTIAL     Status: Abnormal   Collection Time    10/01/13 10:30 AM      Result Value Ref Range   WBC 13.2 (*) 4.0 - 10.5 K/uL   RBC 5.04  4.22 - 5.81 MIL/uL   Hemoglobin 15.8  13.0 - 17.0 g/dL   HCT 45.8  39.0 - 52.0 %   MCV 90.9  78.0 - 100.0 fL   MCH 31.3  26.0 - 34.0 pg   MCHC 34.5  30.0 - 36.0 g/dL   RDW 13.4  11.5 - 15.5 %   Platelets 277  150 - 400 K/uL   Neutrophils Relative % 82 (*) 43 - 77 %   Neutro Abs 10.9 (*) 1.7 - 7.7 K/uL   Lymphocytes Relative 8 (*) 12 - 46 %   Lymphs Abs 1.0  0.7 - 4.0 K/uL   Monocytes Relative 10  3 - 12 %   Monocytes Absolute 1.3 (*) 0.1 - 1.0 K/uL   Eosinophils Relative 0  0 - 5 %   Eosinophils Absolute 0.0  0.0 - 0.7 K/uL   Basophils Relative 0  0 - 1 %   Basophils Absolute 0.0  0.0 - 0.1 K/uL  COMPREHENSIVE METABOLIC PANEL     Status: Abnormal   Collection Time    10/01/13 10:30 AM      Result Value Ref Range   Sodium 141  137 - 147 mEq/L   Potassium 4.5  3.7 - 5.3 mEq/L   Chloride 101  96 - 112 mEq/L   CO2 22  19 - 32  mEq/L   Glucose, Bld 143 (*) 70 - 99 mg/dL   BUN 26 (*) 6 - 23 mg/dL   Creatinine, Ser 1.80 (*) 0.50 - 1.35 mg/dL   Calcium 9.1  8.4 - 10.5 mg/dL   Total Protein 7.2  6.0 - 8.3 g/dL   Albumin 3.5  3.5 - 5.2 g/dL   AST 21  0 - 37 U/L   ALT 12  0 - 53 U/L   Alkaline Phosphatase 55  39 - 117 U/L   Total Bilirubin 3.1 (*) 0.3 - 1.2 mg/dL   GFR calc non Af Amer 36 (*) >90 mL/min   GFR calc Af Amer 42 (*) >90 mL/min   Comment: (NOTE)     The eGFR has been calculated using the CKD EPI equation.     This calculation has not been validated in all clinical situations.     eGFR's persistently <90 mL/min signify possible Chronic Kidney     Disease.   Anion gap 18 (*) 5 - 15  LIPASE, BLOOD     Status: None   Collection Time    10/01/13 10:30 AM      Result Value Ref Range   Lipase 11  11 - 59 U/L  URINALYSIS, ROUTINE W REFLEX MICROSCOPIC     Status: Abnormal   Collection Time    10/01/13 11:19 AM      Result Value Ref Range   Color, Urine AMBER (*) YELLOW   Comment: BIOCHEMICALS MAY BE AFFECTED BY COLOR   APPearance HAZY (*) CLEAR   Specific Gravity, Urine 1.030  1.005 - 1.030   pH 5.0  5.0 - 8.0   Glucose, UA NEGATIVE  NEGATIVE mg/dL   Hgb urine dipstick NEGATIVE  NEGATIVE   Bilirubin Urine SMALL (*) NEGATIVE   Ketones, ur 15 (*) NEGATIVE mg/dL   Protein, ur NEGATIVE  NEGATIVE mg/dL   Urobilinogen, UA 0.2  0.0 - 1.0 mg/dL   Nitrite NEGATIVE  NEGATIVE   Leukocytes, UA NEGATIVE  NEGATIVE   Comment: MICROSCOPIC NOT DONE ON URINES WITH NEGATIVE PROTEIN, BLOOD, LEUKOCYTES, NITRITE, OR GLUCOSE <1000 mg/dL.   Ct Abdomen Pelvis Wo Contrast  10/01/2013   CLINICAL DATA:  Abdominal pain with nausea and vomiting. History of small bowel obstruction.  EXAM: CT ABDOMEN AND PELVIS WITHOUT CONTRAST  TECHNIQUE: Multidetector CT imaging of the abdomen and pelvis was performed following the standard protocol  without IV contrast.  COMPARISON:  06/14/2013.  FINDINGS: There is dilation of the proximal and  mid small bowel. The more distal ileum is decompressed. There is a short but gradual transition from mildly dilated ileum to decompressed ileum in the right pelvis. Findings suggest a low grade partial small bowel obstruction.  There is no bowel wall thickening or inflammation. The colon shows air-fluid levels multiple diverticula, but no wall thickening or inflammatory changes. No colonic dilation. There are numerous colonic diverticula without evidence of diverticulitis.  No free air.  There is opacity at the right posterior lung base similar to the prior exam, most likely atelectasis. Milder atelectasis at the left lung base, also stable. Heart is normal in size.  Liver, spleen, gallbladder, pancreas, adrenal glands:  Normal.  9 mm low-density lesion arises from the posterior mid to lower pole of the left kidney, stable consistent with a cyst. No other renal masses, no stones and no hydronephrosis. Normal ureters and bladder.  No pathologically enlarged lymph nodes. No abnormal fluid collections.  Atherosclerotic plaque is noted throughout the aorta.  No aneurysm.  Degenerative changes are noted of the visualized spine. No osteoblastic or osteolytic lesions.  IMPRESSION: 1. Mild dilation of the small bowel most evident of the jejunum with milder dilation of the ileum. There is a short gradual transition to decompressed distal ileum in the right pelvis. Findings most consistent with a low grade partial small bowel obstruction. 2. No bowel wall thickening or adjacent inflammation. 3. No other acute finding. 4. Right greater than left lung base atelectasis similar to the prior CT. 5. Numerous left colon diverticula.  No diverticulitis.   Electronically Signed   By: Lajean Manes M.D.   On: 10/01/2013 13:39       Assessment/Plan 1. Nausea, vomiting, diarrhea 2. Small bowel dilatation 3. ARI, likely secondary to dehydration 4. Hypotension, likely secondary to dehydration 5. COPD  Plan: 1. The patient does  not have a complete small bowel obstruction.  His symptoms seem more consistent with a gastroenteritis, but he keeps having repeated episodes like this and has had no sick contacts.  He has had a recent colonoscopy which was negative.  He had an upper endo about 8 years ago that was normal as well.  He is followed by Dr. Leonie Douglas with GI for some of these GI issues.  He does have risk factors for adhesive disease given his 2 prior abdominal operations.   2. We will place an NGT and treat with conservative management.  Repeat abdominal films in the morning to follow his contrast.   3. BP has responded to fluids in the ED.  Suspect creatinine will respond as well after fluid resuscitation. 4. Will send stool for culture and for c diff check. 5. Repeat labs in the morning.  Marieliz Strang E 10/01/2013, 2:24 PM Pager: [phone]

## 2013-10-01 NOTE — Consult Note (Signed)
Distended but NT on my exam. ED trying to place NGT. Needs IV hydration, NGT. Will follow closely for improvement. Patient examined and I agree with the assessment and plan  Georganna Skeans, MD, MPH, FACS Trauma: [phone] General Surgery: [phone]  10/01/2013 4:17 PM

## 2013-10-01 NOTE — H&P (Signed)
See my other note. Patient examined and I agree with the assessment and plan  Georganna Skeans, MD, MPH, FACS Trauma: [phone] General Surgery: [phone]  10/01/2013 4:18 PM

## 2013-10-01 NOTE — Consult Note (Signed)
Lonnie Schaefer [birth year]  [id number].   Chief Complaint/Reason for Consult: bowel obstruction HPI: This is a 73 yo white male with a h/o bowel obstruction, COPD, who began having abdominal pain on Wednesday along with diarrhea.  His diarrhea has persisted and is still having it today.  He hasn't had much to eat, just applesauce last night.  Before that, he had been eating well.  Today he started having emesis around 4am.  He denies any fevers or chills.  He just had a colonoscopy about a month ago with Dr. Oletta Lamas which was normal according to the patient.  He hasn't had an EGD in about 8 years.  He states he has had several episodes similar to this one in the last several months.  Each time having the same symptoms of N/V/D with bowel dilatation.    He came to the [facility] today for evaluation secondary to pain, nausea, vomiting, and diarrhea.  He had a CT scan that showed small bowel dilatation with a gradual transition to decompressed distal ileum.  This was felt to be a low grade partial obstruction.  We have been asked to see the patient for further evaluation.  ROS : Please see HPI, otherwise all other systems are negative except for some slight dizziness this morning.  History reviewed. No pertinent family history. Denies any family hx of IBD, gastrointestinal malignancy.  Past Medical History  Diagnosis Date  . Bowel obstruction   . COPD (chronic obstructive pulmonary disease)   . Shortness of breath   . Pneumonia   . H/O hiatal hernia     Past Surgical History  Procedure Laterality Date  . Hernia repair    . Appendectomy      Social History:  reports that he quit smoking about 10 years ago. His smoking use included Cigarettes. He smoked 0.00 packs per day. He does not have any smokeless tobacco history on file. He reports that he does not drink alcohol or use illicit drugs.  Allergies: No Known Allergies   (Not in a hospital admission)  Blood pressure 86/64, pulse 75,  temperature 97.6 F (36.4 C), temperature source Oral, resp. rate 19, SpO2 96.00%. Physical Exam: General: pleasant, WD, WN white male who is laying in bed in NAD HEENT: head is normocephalic, atraumatic.  Sclera are noninjected.  PERRL.  Ears and nose without any masses or lesions.  Mouth is pink and moist Heart: regular, rate, and rhythm.  Normal s1,s2. No obvious murmurs, gallops, or rubs noted.  Palpable radial and pedal pulses bilaterally Lungs: CTAB, no wheezes, rhonchi, or rales noted.  Respiratory effort nonlabored Abd: soft, distended, tender to palpation in left mid and upper abdomen, +BS, no masses, hernias, or organomegaly MS: all 4 extremities are symmetrical with no cyanosis, clubbing, or edema. Skin: warm and dry with no masses, lesions, or rashes Psych: A&Ox3 with an appropriate affect.    Results for orders placed during the hospital encounter of 10/01/13 (from the past 48 hour(s))  CBC WITH DIFFERENTIAL     Status: Abnormal   Collection Time    10/01/13 10:30 AM      Result Value Ref Range   WBC 13.2 (*) 4.0 - 10.5 K/uL   RBC 5.04  4.22 - 5.81 MIL/uL   Hemoglobin 15.8  13.0 - 17.0 g/dL   HCT 45.8  39.0 - 52.0 %   MCV 90.9  78.0 - 100.0 fL   MCH 31.3  26.0 - 34.0 pg   MCHC 34.5  30.0 - 36.0 g/dL   RDW 13.4  11.5 - 15.5 %   Platelets 277  150 - 400 K/uL   Neutrophils Relative % 82 (*) 43 - 77 %   Neutro Abs 10.9 (*) 1.7 - 7.7 K/uL   Lymphocytes Relative 8 (*) 12 - 46 %   Lymphs Abs 1.0  0.7 - 4.0 K/uL   Monocytes Relative 10  3 - 12 %   Monocytes Absolute 1.3 (*) 0.1 - 1.0 K/uL   Eosinophils Relative 0  0 - 5 %   Eosinophils Absolute 0.0  0.0 - 0.7 K/uL   Basophils Relative 0  0 - 1 %   Basophils Absolute 0.0  0.0 - 0.1 K/uL  COMPREHENSIVE METABOLIC PANEL     Status: Abnormal   Collection Time    10/01/13 10:30 AM      Result Value Ref Range   Sodium 141  137 - 147 mEq/L   Potassium 4.5  3.7 - 5.3 mEq/L   Chloride 101  96 - 112 mEq/L   CO2 22  19 - 32  mEq/L   Glucose, Bld 143 (*) 70 - 99 mg/dL   BUN 26 (*) 6 - 23 mg/dL   Creatinine, Ser 1.80 (*) 0.50 - 1.35 mg/dL   Calcium 9.1  8.4 - 10.5 mg/dL   Total Protein 7.2  6.0 - 8.3 g/dL   Albumin 3.5  3.5 - 5.2 g/dL   AST 21  0 - 37 U/L   ALT 12  0 - 53 U/L   Alkaline Phosphatase 55  39 - 117 U/L   Total Bilirubin 3.1 (*) 0.3 - 1.2 mg/dL   GFR calc non Af Amer 36 (*) >90 mL/min   GFR calc Af Amer 42 (*) >90 mL/min   Comment: (NOTE)     The eGFR has been calculated using the CKD EPI equation.     This calculation has not been validated in all clinical situations.     eGFR's persistently <90 mL/min signify possible Chronic Kidney     Disease.   Anion gap 18 (*) 5 - 15  LIPASE, BLOOD     Status: None   Collection Time    10/01/13 10:30 AM      Result Value Ref Range   Lipase 11  11 - 59 U/L  URINALYSIS, ROUTINE W REFLEX MICROSCOPIC     Status: Abnormal   Collection Time    10/01/13 11:19 AM      Result Value Ref Range   Color, Urine AMBER (*) YELLOW   Comment: BIOCHEMICALS MAY BE AFFECTED BY COLOR   APPearance HAZY (*) CLEAR   Specific Gravity, Urine 1.030  1.005 - 1.030   pH 5.0  5.0 - 8.0   Glucose, UA NEGATIVE  NEGATIVE mg/dL   Hgb urine dipstick NEGATIVE  NEGATIVE   Bilirubin Urine SMALL (*) NEGATIVE   Ketones, ur 15 (*) NEGATIVE mg/dL   Protein, ur NEGATIVE  NEGATIVE mg/dL   Urobilinogen, UA 0.2  0.0 - 1.0 mg/dL   Nitrite NEGATIVE  NEGATIVE   Leukocytes, UA NEGATIVE  NEGATIVE   Comment: MICROSCOPIC NOT DONE ON URINES WITH NEGATIVE PROTEIN, BLOOD, LEUKOCYTES, NITRITE, OR GLUCOSE <1000 mg/dL.   Ct Abdomen Pelvis Wo Contrast  10/01/2013   CLINICAL DATA:  Abdominal pain with nausea and vomiting. History of small bowel obstruction.  EXAM: CT ABDOMEN AND PELVIS WITHOUT CONTRAST  TECHNIQUE: Multidetector CT imaging of the abdomen and pelvis was performed following the standard protocol  without IV contrast.  COMPARISON:  06/14/2013.  FINDINGS: There is dilation of the proximal and  mid small bowel. The more distal ileum is decompressed. There is a short but gradual transition from mildly dilated ileum to decompressed ileum in the right pelvis. Findings suggest a low grade partial small bowel obstruction.  There is no bowel wall thickening or inflammation. The colon shows air-fluid levels multiple diverticula, but no wall thickening or inflammatory changes. No colonic dilation. There are numerous colonic diverticula without evidence of diverticulitis.  No free air.  There is opacity at the right posterior lung base similar to the prior exam, most likely atelectasis. Milder atelectasis at the left lung base, also stable. Heart is normal in size.  Liver, spleen, gallbladder, pancreas, adrenal glands:  Normal.  9 mm low-density lesion arises from the posterior mid to lower pole of the left kidney, stable consistent with a cyst. No other renal masses, no stones and no hydronephrosis. Normal ureters and bladder.  No pathologically enlarged lymph nodes. No abnormal fluid collections.  Atherosclerotic plaque is noted throughout the aorta.  No aneurysm.  Degenerative changes are noted of the visualized spine. No osteoblastic or osteolytic lesions.  IMPRESSION: 1. Mild dilation of the small bowel most evident of the jejunum with milder dilation of the ileum. There is a short gradual transition to decompressed distal ileum in the right pelvis. Findings most consistent with a low grade partial small bowel obstruction. 2. No bowel wall thickening or adjacent inflammation. 3. No other acute finding. 4. Right greater than left lung base atelectasis similar to the prior CT. 5. Numerous left colon diverticula.  No diverticulitis.   Electronically Signed   By: Lajean Manes M.D.   On: 10/01/2013 13:39       Assessment/Plan 1. Nausea, vomiting, diarrhea 2. Small bowel dilatation 3. ARI, likely secondary to dehydration 4. Hypotension, likely secondary to dehydration 5. COPD  Plan: 1. The patient does  not have a complete small bowel obstruction.  His symptoms seem more consistent with a gastroenteritis, but he keeps having repeated episodes like this and has had no sick contacts.  He has had a recent colonoscopy which was negative.  He had an upper endo about 8 years ago that was normal as well.  He is followed by Dr. Leonie Douglas with GI for some of these GI issues.  He does have risk factors for adhesive disease given his 2 prior abdominal operations.   2. We will place an NGT and treat with conservative management.  Repeat abdominal films in the morning to follow his contrast.   3. BP has responded to fluids in the ED.  Suspect creatinine will respond as well after fluid resuscitation. 4. Will send stool for culture and for c diff check. 5. Repeat labs in the morning.  Tollie Canada E 10/01/2013, 2:24 PM Pager: [phone]

## 2013-10-02 ENCOUNTER — Inpatient Hospital Stay (HOSPITAL_COMMUNITY): Payer: Medicare Other

## 2013-10-02 LAB — BASIC METABOLIC PANEL WITH GFR
Anion gap: 10 (ref 5–15)
BUN: 19 mg/dL (ref 6–23)
CO2: 24 meq/L (ref 19–32)
Calcium: 7.3 mg/dL — ABNORMAL LOW (ref 8.4–10.5)
Chloride: 102 meq/L (ref 96–112)
Creatinine, Ser: 1.09 mg/dL (ref 0.50–1.35)
GFR calc Af Amer: 77 mL/min — ABNORMAL LOW (ref >90)
GFR calc non Af Amer: 67 mL/min — ABNORMAL LOW (ref >90)
Glucose, Bld: 149 mg/dL — ABNORMAL HIGH (ref 70–99)
Potassium: 4 meq/L (ref 3.7–5.3)
Sodium: 136 meq/L — ABNORMAL LOW (ref 137–147)

## 2013-10-02 LAB — CBC
HCT: 37.7 % — ABNORMAL LOW (ref 39.0–52.0)
Hemoglobin: 12.9 g/dL — ABNORMAL LOW (ref 13.0–17.0)
MCH: 31.5 pg (ref 26.0–34.0)
MCHC: 34.2 g/dL (ref 30.0–36.0)
MCV: 92 fL (ref 78.0–100.0)
Platelets: 194 10*3/uL (ref 150–400)
RBC: 4.1 MIL/uL — ABNORMAL LOW (ref 4.22–5.81)
RDW: 13.3 % (ref 11.5–15.5)
WBC: 7.1 10*3/uL (ref 4.0–10.5)

## 2013-10-02 LAB — CLOSTRIDIUM DIFFICILE BY PCR: Toxigenic C. Difficile by PCR: NEGATIVE

## 2013-10-02 MED ORDER — CHLORHEXIDINE GLUCONATE 0.12 % MT SOLN
15.0000 mL | Freq: Two times a day (BID) | OROMUCOSAL | Status: DC
  Administered 2013-10-02 – 2013-10-09 (×13): 15 mL via OROMUCOSAL
  Filled 2013-10-02 (×13): qty 15

## 2013-10-02 MED ORDER — CETYLPYRIDINIUM CHLORIDE 0.05 % MT LIQD
7.0000 mL | Freq: Two times a day (BID) | OROMUCOSAL | Status: DC
  Administered 2013-10-02 – 2013-10-08 (×11): 7 mL via OROMUCOSAL

## 2013-10-02 NOTE — Progress Notes (Signed)
Attempted x1 with a 58F NG tube to pass, unable.  Dr. Dalbert Batman notified, will get radiology to place NG. Radiology called.

## 2013-10-02 NOTE — Progress Notes (Signed)
Subjective: NG I taken to the ER but unsuccessful. No further attempts, apparently. His abdominal x-rays have not been done as of this hour. He states he's had 2 loose stools but minimal flatus. He said he states he is still having pain more on the left than anywhere else. No emesis. WBC down to 7100.. Creatinine down to 1.09. Potassium 4.0. Glucose 149. Anion gap normal at 10.  Objective: Vital signs in last 24 hours: Temp:  [97.6 F (36.4 C)-98.2 F (36.8 C)] 97.7 F (36.5 C) (08/29 1000) Pulse Rate:  [60-108] 84 (08/29 1000) Resp:  [15-27] 20 (08/29 1000) BP: (78-129)/(51-75) 111/75 mmHg (08/29 1000) SpO2:  [91 %-97 %] 95 % (08/29 1000) Last BM Date: 10/01/13  Intake/Output from previous day: 08/28 0701 - 08/29 0700 In: 1622.9 [I.V.:1622.9] Out: 400 [Urine:400] Intake/Output this shift:    General appearance: cooperative. Mild distress. Middle status normal. Resp: clear to auscultation bilaterally GI: soft but distended, somewhat tympanitic, a little tight. Tenderness throughout, left greater than right, no peritoneal signs.   Hyperactive bowel sounds.  A midline incision intact without hernia. No inguinal hernia.  Lab Results:   Recent Labs  10/01/13 1030 10/02/13 0508  WBC 13.2* 7.1  HGB 15.8 12.9*  HCT 45.8 37.7*  PLT 277 194   BMET  Recent Labs  10/01/13 1030 10/02/13 0508  NA 141 136*  K 4.5 4.0  CL 101 102  CO2 22 24  GLUCOSE 143* 149*  BUN 26* 19  CREATININE 1.80* 1.09  CALCIUM 9.1 7.3*   PT/INR No results found for this basename: LABPROT, INR,  in the last 72 hours ABG No results found for this basename: PHART, PCO2, PO2, HCO3,  in the last 72 hours  Studies/Results: Ct Abdomen Pelvis Wo Contrast  10/01/2013   CLINICAL DATA:  Abdominal pain with nausea and vomiting. History of small bowel obstruction.  EXAM: CT ABDOMEN AND PELVIS WITHOUT CONTRAST  TECHNIQUE: Multidetector CT imaging of the abdomen and pelvis was performed following the  standard protocol without IV contrast.  COMPARISON:  06/14/2013.  FINDINGS: There is dilation of the proximal and mid small bowel. The more distal ileum is decompressed. There is a short but gradual transition from mildly dilated ileum to decompressed ileum in the right pelvis. Findings suggest a low grade partial small bowel obstruction.  There is no bowel wall thickening or inflammation. The colon shows air-fluid levels multiple diverticula, but no wall thickening or inflammatory changes. No colonic dilation. There are numerous colonic diverticula without evidence of diverticulitis.  No free air.  There is opacity at the right posterior lung base similar to the prior exam, most likely atelectasis. Milder atelectasis at the left lung base, also stable. Heart is normal in size.  Liver, spleen, gallbladder, pancreas, adrenal glands:  Normal.  9 mm low-density lesion arises from the posterior mid to lower pole of the left kidney, stable consistent with a cyst. No other renal masses, no stones and no hydronephrosis. Normal ureters and bladder.  No pathologically enlarged lymph nodes. No abnormal fluid collections.  Atherosclerotic plaque is noted throughout the aorta.  No aneurysm.  Degenerative changes are noted of the visualized spine. No osteoblastic or osteolytic lesions.  IMPRESSION: 1. Mild dilation of the small bowel most evident of the jejunum with milder dilation of the ileum. There is a short gradual transition to decompressed distal ileum in the right pelvis. Findings most consistent with a low grade partial small bowel obstruction. 2. No bowel wall thickening or  adjacent inflammation. 3. No other acute finding. 4. Right greater than left lung base atelectasis similar to the prior CT. 5. Numerous left colon diverticula.  No diverticulitis.   Electronically Signed   By: Lajean Manes M.D.   On: 10/01/2013 13:39    Anti-infectives: Anti-infectives   None      Assessment/Plan:   1. Nausea, vomiting,  diarrhea, Abdominal distention. Partial SBO versus gastroenteritis. Recurrent. There is no indication for acute surgical intervention. He will need to be watched carefully, however.  2. Small bowel dilatation   3. ARI, likely secondary to dehydration. This is improved following overnight hydration.  4. Hypotension, likely secondary to dehydration. Resolved.  5. COPD  Discussed with nursing staff. The most important thing now is to get an NG tube placed. RN we'll attempt through the right nares and it's impossible, well as per hour to place under fluoroscopy. We'll go ahead with his belly films this morning. Repeat belly films and laboratory more.    LOS: 1 day    Cottrell Gentles M 10/02/2013

## 2013-10-03 ENCOUNTER — Inpatient Hospital Stay (HOSPITAL_COMMUNITY): Payer: Medicare Other

## 2013-10-03 LAB — BASIC METABOLIC PANEL
Anion gap: 7 (ref 5–15)
BUN: 6 mg/dL (ref 6–23)
CO2: 28 mEq/L (ref 19–32)
Calcium: 7.5 mg/dL — ABNORMAL LOW (ref 8.4–10.5)
Chloride: 102 mEq/L (ref 96–112)
Creatinine, Ser: 0.89 mg/dL (ref 0.50–1.35)
GFR calc Af Amer: >90 mL/min (ref >90)
GFR calc non Af Amer: 85 mL/min — ABNORMAL LOW (ref >90)
Glucose, Bld: 121 mg/dL — ABNORMAL HIGH (ref 70–99)
Potassium: 4.4 mEq/L (ref 3.7–5.3)
Sodium: 137 mEq/L (ref 137–147)

## 2013-10-03 LAB — CBC
HCT: 35.8 % — ABNORMAL LOW (ref 39.0–52.0)
Hemoglobin: 12 g/dL — ABNORMAL LOW (ref 13.0–17.0)
MCH: 31.3 pg (ref 26.0–34.0)
MCHC: 33.5 g/dL (ref 30.0–36.0)
MCV: 93.5 fL (ref 78.0–100.0)
Platelets: 189 10*3/uL (ref 150–400)
RBC: 3.83 MIL/uL — ABNORMAL LOW (ref 4.22–5.81)
RDW: 13.3 % (ref 11.5–15.5)
WBC: 4.7 10*3/uL (ref 4.0–10.5)

## 2013-10-03 NOTE — Progress Notes (Signed)
Subjective: Feeling a good bit better. Had 2 bowel movements. Minimal pain. X-ray shows improvement but not resolution of SBO. Contrast in colon. And she called and stomach with tip in duodenum.   Objective: Vital signs in last 24 hours: Temp:  [97.9 F (36.6 C)-98.7 F (37.1 C)] 97.9 F (36.6 C) (08/30 0448) Pulse Rate:  [81-83] 82 (08/30 0448) Resp:  [17-18] 17 (08/30 0448) BP: (106-123)/(60-70) 117/68 mmHg (08/30 0448) SpO2:  [91 %-96 %] 96 % (08/30 0800) Last BM Date: 10/03/13  Intake/Output from previous day: 08/29 0701 - 08/30 0700 In: 1011.3 [I.V.:981.3; NG/GT:30] Out: 1110 [Urine:900; Emesis/NG output:210] Intake/Output this shift: Total I/O In: 30 [NG/GT:30] Out: 200 [Urine:200]  General appearance: alert. No obvious distress. Mental status normal. Slightly feels better than yesterday. Resp: clear to auscultation bilaterally GI: still a little distended and tympanitic, but soft. Mild tenderness. No peritoneal signs. Hypoactive bowel sounds. No high pitched bowel sounds.  Lab Results:   Recent Labs  10/02/13 0508 10/03/13 0423  WBC 7.1 4.7  HGB 12.9* 12.0*  HCT 37.7* 35.8*  PLT 194 189   BMET  Recent Labs  10/02/13 0508 10/03/13 0423  NA 136* 137  K 4.0 4.4  CL 102 102  CO2 24 28  GLUCOSE 149* 121*  BUN 19 6  CREATININE 1.09 0.89  CALCIUM 7.3* 7.5*   PT/INR No results found for this basename: LABPROT, INR,  in the last 72 hours ABG No results found for this basename: PHART, PCO2, PO2, HCO3,  in the last 72 hours  Studies/Results: Ct Abdomen Pelvis Wo Contrast  10/01/2013   CLINICAL DATA:  Abdominal pain with nausea and vomiting. History of small bowel obstruction.  EXAM: CT ABDOMEN AND PELVIS WITHOUT CONTRAST  TECHNIQUE: Multidetector CT imaging of the abdomen and pelvis was performed following the standard protocol without IV contrast.  COMPARISON:  06/14/2013.  FINDINGS: There is dilation of the proximal and mid small bowel. The more distal  ileum is decompressed. There is a short but gradual transition from mildly dilated ileum to decompressed ileum in the right pelvis. Findings suggest a low grade partial small bowel obstruction.  There is no bowel wall thickening or inflammation. The colon shows air-fluid levels multiple diverticula, but no wall thickening or inflammatory changes. No colonic dilation. There are numerous colonic diverticula without evidence of diverticulitis.  No free air.  There is opacity at the right posterior lung base similar to the prior exam, most likely atelectasis. Milder atelectasis at the left lung base, also stable. Heart is normal in size.  Liver, spleen, gallbladder, pancreas, adrenal glands:  Normal.  9 mm low-density lesion arises from the posterior mid to lower pole of the left kidney, stable consistent with a cyst. No other renal masses, no stones and no hydronephrosis. Normal ureters and bladder.  No pathologically enlarged lymph nodes. No abnormal fluid collections.  Atherosclerotic plaque is noted throughout the aorta.  No aneurysm.  Degenerative changes are noted of the visualized spine. No osteoblastic or osteolytic lesions.  IMPRESSION: 1. Mild dilation of the small bowel most evident of the jejunum with milder dilation of the ileum. There is a short gradual transition to decompressed distal ileum in the right pelvis. Findings most consistent with a low grade partial small bowel obstruction. 2. No bowel wall thickening or adjacent inflammation. 3. No other acute finding. 4. Right greater than left lung base atelectasis similar to the prior CT. 5. Numerous left colon diverticula.  No diverticulitis.   Electronically Signed  By: Lajean Manes M.D.   On: 10/01/2013 13:39   Dg Abd 2 Views  10/03/2013   CLINICAL DATA:  Small bowel obstruction  EXAM: ABDOMEN - 2 VIEW  COMPARISON:  Most recent prior KUB 10/02/2013; placement of nasogastric tube under fluoroscopic guidance 10/02/2013  FINDINGS: Nasogastric tube is  redundant within the stomach. The tip is likely within the proximal duodenum. The stomach is decompressed. Improving bowel gas pattern. There are persistent loops of dilated and air filled small bowel throughout the at, however the degree of distention has improved measuring up to 4 cm now compared to 6.6 cm previously. Oral contrast material is noted throughout the colon to the level of the rectum. Colonic diverticulosis noted incidentally. No free air. No acute osseous abnormality.  IMPRESSION: 1. Redundant nasogastric tube within the stomach. The tip of the tube is likely in the proximal duodenum. However, the stomach is successfully decompressed. 2. Improving small bowel obstruction. Distension of the gas filled small bowel loops in the mid abdomen measures 4 cm today compared to 6.6 cm previously. 3. Similar volume of retained oral contrast material throughout the colon. 4. Colonic diverticulosis.   Electronically Signed   By: Jacqulynn Cadet M.D.   On: 10/03/2013 07:50   Dg Abd 2 Views  10/02/2013   CLINICAL DATA:  Followup evaluation for small bowel dilatation.  EXAM: ABDOMEN - 2 VIEW  COMPARISON:  Abdominal radiograph 06/14/2013.  FINDINGS: The oral contrast from the recent CT scan is now within the colon extending all the way to the level of the distal rectum. Numerous colonic diverticulae are noted. However, there continues to be extensive small bowel dilatation, which has worsened, with loops of small bowel in the upper central abdomen measure up to 6.6 cm in diameter. Multiple air-fluid levels are noted within the small bowel. No frank pneumoperitoneum.  IMPRESSION: 1. Findings, as above, compatible with probable high-grade partial small bowel obstruction. This appears to have worsened compared to prior examinations. 2. No pneumoperitoneum. 3. Colonic diverticulosis.   Electronically Signed   By: Vinnie Langton M.D.   On: 10/02/2013 12:07   Dg Loyce Dys Tube Plc W/fl W/rad  10/02/2013   CLINICAL  DATA:  Difficulty placing NG tube.  EXAM: NASO G TUBE PLACEMENT WITH FL AND WITH RAD  TECHNIQUE: Following lubrication of a nasogastric tube and using fluoroscopic guidance nasogastric tube was placed. Nasogastric tube tip was placed into the stomach.  CONTRAST:  None.  FLUOROSCOPY TIME:  0 min 26 seconds.  COMPARISON:  Abdomen series 8/29 2015.  FINDINGS: Successful placement of NG tube into the stomach using fluoroscopic guidance. No complications.  IMPRESSION: Successful NG tube placement.  Tip of the tube is in the stomach.   Electronically Signed   By: Marcello Moores  Register   On: 10/02/2013 12:34    Anti-infectives: Anti-infectives   None      Assessment/Plan:  1. Nausea, vomiting, diarrhea, Abdominal distention. Partial SBO versus gastroenteritis. Recurrent.  There is no indication for acute surgical intervention.  His condition has improved over the past 24 hours with NG decompression. Next item continue NG decompression Lab work and x-ray tomorrow Ambulate He will need to be watched carefully  2. Small bowel dilatation  3. ARI, likely secondary to dehydration. This is improved  4. Hypotension, likely secondary to dehydration. Resolved.  5. COPD 6. History of 2 laparotomies. He states that both of these were for obstruction and the second one involved hernia repair with mesh by Dr. Rise Patience  LOS: 2 days    Lans[place] Sigmon M 10/03/2013

## 2013-10-04 ENCOUNTER — Inpatient Hospital Stay (HOSPITAL_COMMUNITY): Payer: Medicare Other

## 2013-10-04 LAB — CBC
HCT: 40.1 % (ref 39.0–52.0)
Hemoglobin: 13.6 g/dL (ref 13.0–17.0)
MCH: 31 pg (ref 26.0–34.0)
MCHC: 33.9 g/dL (ref 30.0–36.0)
MCV: 91.3 fL (ref 78.0–100.0)
Platelets: 241 10*3/uL (ref 150–400)
RBC: 4.39 MIL/uL (ref 4.22–5.81)
RDW: 13.1 % (ref 11.5–15.5)
WBC: 4.7 10*3/uL (ref 4.0–10.5)

## 2013-10-04 LAB — BASIC METABOLIC PANEL
Anion gap: 11 (ref 5–15)
BUN: <3 mg/dL — ABNORMAL LOW (ref 6–23)
CO2: 25 mEq/L (ref 19–32)
Calcium: 7.7 mg/dL — ABNORMAL LOW (ref 8.4–10.5)
Chloride: 104 mEq/L (ref 96–112)
Creatinine, Ser: 0.75 mg/dL (ref 0.50–1.35)
GFR calc Af Amer: >90 mL/min (ref >90)
GFR calc non Af Amer: >90 mL/min (ref >90)
Glucose, Bld: 112 mg/dL — ABNORMAL HIGH (ref 70–99)
Potassium: 3.3 mEq/L — ABNORMAL LOW (ref 3.7–5.3)
Sodium: 140 mEq/L (ref 137–147)

## 2013-10-04 NOTE — Progress Notes (Signed)
Patient compalining of being nauseous, Zofran given, vomited very small amout , rehook to suction but about 20 ml out .NGT clamped and encourage patient to ambulate. Will continue to monitor.

## 2013-10-04 NOTE — Progress Notes (Signed)
Subjective: Feels great. +BMs. Maybe still a little distended; min NG output  Objective: Vital signs in last 24 hours: Temp:  [98 F (36.7 C)-98.4 F (36.9 C)] 98 F (36.7 C) (08/31 0530) Pulse Rate:  [70-79] 72 (08/31 0530) Resp:  [16-18] 18 (08/31 0530) BP: (114-128)/(68-72) 114/72 mmHg (08/31 0530) SpO2:  [94 %-97 %] 96 % (08/31 0530) Last BM Date: 04-Oct-2013  Intake/Output from previous day: 10/05/2022 0701 - 08/31 0700 In: 4848.8 [I.V.:4818.8; NG/GT:30] Out: 316 [Urine:203; Emesis/NG output:110; Stool:3] Intake/Output this shift:    Alert, nad cta  Soft, mild distension, NT  Lab Results:   Recent Labs  10/02/13 0508 2013/10/04 0423  WBC 7.1 4.7  HGB 12.9* 12.0*  HCT 37.7* 35.8*  PLT 194 189   BMET  Recent Labs  10/02/13 0508 04-Oct-2013 0423  NA 136* 137  K 4.0 4.4  CL 102 102  CO2 24 28  GLUCOSE 149* 121*  BUN 19 6  CREATININE 1.09 0.89  CALCIUM 7.3* 7.5*   PT/INR No results found for this basename: LABPROT, INR,  in the last 72 hours ABG No results found for this basename: PHART, PCO2, PO2, HCO3,  in the last 72 hours  Studies/Results: Dg Abd 2 Views  04-Oct-2013   CLINICAL DATA:  Small bowel obstruction  EXAM: ABDOMEN - 2 VIEW  COMPARISON:  Most recent prior KUB 10/02/2013; placement of nasogastric tube under fluoroscopic guidance 10/02/2013  FINDINGS: Nasogastric tube is redundant within the stomach. The tip is likely within the proximal duodenum. The stomach is decompressed. Improving bowel gas pattern. There are persistent loops of dilated and air filled small bowel throughout the at, however the degree of distention has improved measuring up to 4 cm now compared to 6.6 cm previously. Oral contrast material is noted throughout the colon to the level of the rectum. Colonic diverticulosis noted incidentally. No free air. No acute osseous abnormality.  IMPRESSION: 1. Redundant nasogastric tube within the stomach. The tip of the tube is likely in the proximal  duodenum. However, the stomach is successfully decompressed. 2. Improving small bowel obstruction. Distension of the gas filled small bowel loops in the mid abdomen measures 4 cm today compared to 6.6 cm previously. 3. Similar volume of retained oral contrast material throughout the colon. 4. Colonic diverticulosis.   Electronically Signed   By: Jacqulynn Cadet M.D.   On: 2013/10/04 07:50   Dg Abd 2 Views  10/02/2013   CLINICAL DATA:  Followup evaluation for small bowel dilatation.  EXAM: ABDOMEN - 2 VIEW  COMPARISON:  Abdominal radiograph 06/14/2013.  FINDINGS: The oral contrast from the recent CT scan is now within the colon extending all the way to the level of the distal rectum. Numerous colonic diverticulae are noted. However, there continues to be extensive small bowel dilatation, which has worsened, with loops of small bowel in the upper central abdomen measure up to 6.6 cm in diameter. Multiple air-fluid levels are noted within the small bowel. No frank pneumoperitoneum.  IMPRESSION: 1. Findings, as above, compatible with probable high-grade partial small bowel obstruction. This appears to have worsened compared to prior examinations. 2. No pneumoperitoneum. 3. Colonic diverticulosis.   Electronically Signed   By: Vinnie Langton M.D.   On: 10/02/2013 12:07   Dg Loyce Dys Tube Plc W/fl W/rad  10/02/2013   CLINICAL DATA:  Difficulty placing NG tube.  EXAM: NASO G TUBE PLACEMENT WITH FL AND WITH RAD  TECHNIQUE: Following lubrication of a nasogastric tube and using fluoroscopic guidance  nasogastric tube was placed. Nasogastric tube tip was placed into the stomach.  CONTRAST:  None.  FLUOROSCOPY TIME:  0 min 26 seconds.  COMPARISON:  Abdomen series 8/29 2015.  FINDINGS: Successful placement of NG tube into the stomach using fluoroscopic guidance. No complications.  IMPRESSION: Successful NG tube placement.  Tip of the tube is in the stomach.   Electronically Signed   By: Marcello Moores  Register   On: 10/02/2013  12:34    Anti-infectives: Anti-infectives   None      Assessment/Plan: 1. Nausea, vomiting, diarrhea, Abdominal distention. Partial SBO versus gastroenteritis. Recurrent.  There is no indication for acute surgical intervention.  His condition has continued to improve over the past 48 hours with NG decompression.  Clamp NG tube Clear liquid diet;  If tolerates diet will remove NG later today or in am (since difficult placment) F/u labs   2. Small bowel dilatation  3. ARI, likely secondary to dehydration. This is improved  4. Hypotension, likely secondary to dehydration. Resolved.  5. COPD  6. History of 2 laparotomies. He states that both of these were for obstruction and the second one involved hernia repair with mesh by Dr. Morrison Old. Redmond Pulling, MD, FACS General, Bariatric, & Minimally Invasive Surgery [facility], [place]    LOS: 3 days    Gayland Curry 10/04/2013

## 2013-10-04 NOTE — Progress Notes (Signed)
Patient vomited, NGT reconnected to suction with an output of about 749ml, advise patient to remain NPO . Will update MD

## 2013-10-05 ENCOUNTER — Inpatient Hospital Stay (HOSPITAL_COMMUNITY): Payer: Medicare Other

## 2013-10-05 ENCOUNTER — Encounter (HOSPITAL_COMMUNITY): Payer: Self-pay | Admitting: Radiology

## 2013-10-05 LAB — OVA AND PARASITE EXAMINATION
Ova and parasites: NONE SEEN
Special Requests: NORMAL

## 2013-10-05 LAB — STOOL CULTURE: Special Requests: NORMAL

## 2013-10-05 MED ORDER — IOHEXOL 300 MG/ML  SOLN
100.0000 mL | Freq: Once | INTRAMUSCULAR | Status: AC | PRN
  Administered 2013-10-05: 100 mL via INTRAVENOUS

## 2013-10-05 MED ORDER — POTASSIUM CHLORIDE 10 MEQ/100ML IV SOLN
10.0000 meq | INTRAVENOUS | Status: AC
  Administered 2013-10-05 (×3): 10 meq via INTRAVENOUS
  Filled 2013-10-05 (×3): qty 100

## 2013-10-05 MED ORDER — POTASSIUM CHLORIDE 10 MEQ/100ML IV SOLN
10.0000 meq | INTRAVENOUS | Status: AC
  Administered 2013-10-05: 10 meq via INTRAVENOUS
  Filled 2013-10-05: qty 100

## 2013-10-05 MED ORDER — TRACE MINERALS CR-CU-F-FE-I-MN-MO-SE-ZN IV SOLN
INTRAVENOUS | Status: AC
  Administered 2013-10-05: 20:00:00 via INTRAVENOUS
  Filled 2013-10-05: qty 1000

## 2013-10-05 MED ORDER — SODIUM CHLORIDE 0.9 % IJ SOLN
10.0000 mL | INTRAMUSCULAR | Status: DC | PRN
  Administered 2013-10-06 – 2013-10-08 (×3): 10 mL

## 2013-10-05 MED ORDER — DEXTROSE-NACL 5-0.45 % IV SOLN
INTRAVENOUS | Status: DC
  Administered 2013-10-08: 06:00:00 via INTRAVENOUS

## 2013-10-05 MED ORDER — FAT EMULSION 20 % IV EMUL
250.0000 mL | INTRAVENOUS | Status: AC
  Administered 2013-10-05: 250 mL via INTRAVENOUS
  Filled 2013-10-05: qty 250

## 2013-10-05 NOTE — Progress Notes (Signed)
  Subjective: Pt con't to have some abd distention.  States he's passing some gas.  Objective: Vital signs in last 24 hours: Temp:  [98.2 F (36.8 C)-98.5 F (36.9 C)] 98.2 F (36.8 C) (09/01 0453) Pulse Rate:  [76-84] 76 (09/01 0453) Resp:  [15-16] 16 (09/01 0453) BP: (107-125)/(62-73) 118/62 mmHg (09/01 0453) SpO2:  [93 %-98 %] 95 % (09/01 0453) Last BM Date: 2013/10/10  Intake/Output from previous day: October 11, 2022 0701 - 09/01 0700 In: 695 [I.V.:695] Out: 350 [Urine:350] Intake/Output this shift:    General appearance: alert and cooperative GI: soft, min dist, hypoactive BS  Lab Results:   Recent Labs  10/03/13 0423 10/10/13 0952  WBC 4.7 4.7  HGB 12.0* 13.6  HCT 35.8* 40.1  PLT 189 241   BMET  Recent Labs  10/03/13 0423 10-Oct-2013 0952  NA 137 140  K 4.4 3.3*  CL 102 104  CO2 28 25  GLUCOSE 121* 112*  BUN 6 <3*  CREATININE 0.89 0.75  CALCIUM 7.5* 7.7*   PT/INR No results found for this basename: LABPROT, INR,  in the last 72 hours ABG No results found for this basename: PHART, PCO2, PO2, HCO3,  in the last 72 hours  Studies/Results: Dg Abd 2 Views  2013/10/10   CLINICAL DATA:  Abdominal pain with diarrhea. Small bowel obstruction.  EXAM: ABDOMEN - 2 VIEW  COMPARISON:  Multiple recent previous exams.  FINDINGS: The tip of the NG tube is in the antral pyloric region. Although the degree of gaseous small bowel dilatation appears slightly improved since 10/02/2013, there is been no substantial change since yesterday's study. Upright film shows no intraperitoneal free air. Contrast material from recent CT scan is again noted in the colon which shows evidence of diffuse diverticulosis.  IMPRESSION: No substantial change in gaseous small bowel distention since yesterday's study. Exam remains improved compared to the study from 10/02/2013.  Interval migration of contrast material through the colon.  Diverticulosis of the colon.   Electronically Signed   By: Misty Stanley  M.D.   On: Oct 10, 2013 07:55    Anti-infectives: Anti-infectives   None      Assessment/Plan:  1. Nausea, vomiting, diarrhea, Abdominal distention. Partial SBO versus gastroenteritis. Recurrent.  There is no indication for acute surgical intervention.  Pt still with some abd distention, passing some gas.  Cont NGT.  CT today to eval any point of obstruction 2. Small bowel dilatation  3. ARI, likely secondary to dehydration. This is improved  4. Hypotension, likely secondary to dehydration. Resolved.  5. COPD  6. History of 2 laparotomies. He states that both of these were for obstruction and the second one involved hernia repair with mesh by Dr. Rise Patience   LOS: 4 days    Rosario Jacks., [facility] 10/05/2013

## 2013-10-05 NOTE — Progress Notes (Addendum)
PARENTERAL NUTRITION CONSULT NOTE - INITIAL  Pharmacy Consult for TPN Indication: pSBO vs gastroenteritis, recurrent  No Known Allergies  Patient Measurements:  wt on 11/30/12 = 75.6 kg, 175 cm tall  Vital Signs: Temp: 98.2 F (36.8 C) (09/01 0453) Temp src: Oral (09/01 0453) BP: 118/62 mmHg (09/01 0453) Pulse Rate: 76 (09/01 0453) Intake/Output from previous day: 08/31 0701 - 09/01 0700 In: 695 [I.V.:695] Out: 350 [Urine:350] Intake/Output from this shift:    Labs:  Recent Labs  10/03/13 0423 10/04/13 0952  WBC 4.7 4.7  HGB 12.0* 13.6  HCT 35.8* 40.1  PLT 189 241     Recent Labs  10/03/13 0423 10/04/13 0952  NA 137 140  K 4.4 3.3*  CL 102 104  CO2 28 25  GLUCOSE 121* 112*  BUN 6 <3*  CREATININE 0.89 0.75  CALCIUM 7.5* 7.7*   The CrCl is unknown because both a height and weight (above a minimum accepted value) are required for this calculation.   No results found for this basename: GLUCAP,  in the last 72 hours  Medical History: Past Medical History  Diagnosis Date  . Bowel obstruction 10/2009=10/01/2013 X 5  . COPD (chronic obstructive pulmonary disease)   . Shortness of breath   . Pneumonia   . H/O hiatal hernia   . On home oxygen therapy     "2L q hs" (10/01/2013)    Medications:  Prescriptions prior to admission  Medication Sig Dispense Refill  . albuterol (PROVENTIL HFA;VENTOLIN HFA) 108 (90 BASE) MCG/ACT inhaler Inhale 1 puff into the lungs every 6 (six) hours as needed for wheezing.       Marland Kitchen alendronate (FOSAMAX) 70 MG tablet Take 70 mg by mouth every 7 (seven) days. Take with a full glass of water on an empty stomach. Take on Tuesday      . aspirin 81 MG chewable tablet Chew 81 mg by mouth daily.      . Calcium Carb-Cholecalciferol 600-400 MG-UNIT TABS Take 1 tablet by mouth daily.       . cholecalciferol (VITAMIN D) 1000 UNITS tablet Take 1,000 Units by mouth daily.      . clobetasol ointment (TEMOVATE) 7.56 % Apply 1 application topically  2 (two) times daily as needed (dry hands).       Donell Sievert IN Inhale 2 each into the lungs See admin instructions. Inhale 2 liters at bedtime      . polyethylene glycol (MIRALAX / GLYCOLAX) packet Take 17 g by mouth daily.  1 each  1  . tiotropium (SPIRIVA) 18 MCG inhalation capsule Place 18 mcg into inhaler and inhale daily.        Insulin Requirements in the past 24 hours:  none  Current Nutrition:  NPO  Assessment: 73 yo man with N/V/D/abdominal distention. Partial SBO versus gastroenteritis. Recurrent. Pharmacy consulted to dose TPN for nutrition support.   GI:  Hypoactive BS, continue NGT, to get CT of abd today to evaluate source for N/V, small bowel distention  Lytes: K low at 3.3, goal is > = 4 for ileus  Access: to get PICC 9/1 for TPN  TPN day # 0  (9/1>>   )  Nutritional Goals:  Per RD consult  Plan:  Start TPN with Clinimix E 5/15 at 42 ml/hr plus lipids at 10 ml/hr to provide 50 gm protein and 1196 kcals and f/u tolerance 4 runs of K CBGs q6h F/u RD consult for goals TPN labs in AM Cut IVF from  75 to 20 ml/hr when new TPN started  Eudelia Bunch, Pharm.D. [phone] 10/05/2013 1:27 PM

## 2013-10-05 NOTE — Progress Notes (Signed)
Peripherally Inserted Central Catheter/Midline Placement  The IV Nurse has discussed with the patient and/or persons authorized to consent for the patient, the purpose of this procedure and the potential benefits and risks involved with this procedure.  The benefits include less needle sticks, lab draws from the catheter and patient may be discharged home with the catheter.  Risks include, but not limited to, infection, bleeding, blood clot (thrombus formation), and puncture of an artery; nerve damage and irregular heat beat.  Alternatives to this procedure were also discussed.  PICC/Midline Placement Documentation        Lonnie Schaefer, Nicolette Bang 10/05/2013, 7:09 PM

## 2013-10-06 LAB — COMPREHENSIVE METABOLIC PANEL
ALT: 28 U/L (ref 0–53)
AST: 28 U/L (ref 0–37)
Albumin: 2.6 g/dL — ABNORMAL LOW (ref 3.5–5.2)
Alkaline Phosphatase: 41 U/L (ref 39–117)
Anion gap: 9 (ref 5–15)
BUN: 4 mg/dL — ABNORMAL LOW (ref 6–23)
CO2: 27 mEq/L (ref 19–32)
Calcium: 7.6 mg/dL — ABNORMAL LOW (ref 8.4–10.5)
Chloride: 105 mEq/L (ref 96–112)
Creatinine, Ser: 0.75 mg/dL (ref 0.50–1.35)
GFR calc Af Amer: >90 mL/min (ref >90)
GFR calc non Af Amer: >90 mL/min (ref >90)
Glucose, Bld: 116 mg/dL — ABNORMAL HIGH (ref 70–99)
Potassium: 3.6 mEq/L — ABNORMAL LOW (ref 3.7–5.3)
Sodium: 141 mEq/L (ref 137–147)
Total Bilirubin: 0.6 mg/dL (ref 0.3–1.2)
Total Protein: 5.7 g/dL — ABNORMAL LOW (ref 6.0–8.3)

## 2013-10-06 LAB — GLUCOSE, CAPILLARY
Glucose-Capillary: 111 mg/dL — ABNORMAL HIGH (ref 70–99)
Glucose-Capillary: 115 mg/dL — ABNORMAL HIGH (ref 70–99)
Glucose-Capillary: 118 mg/dL — ABNORMAL HIGH (ref 70–99)
Glucose-Capillary: 118 mg/dL — ABNORMAL HIGH (ref 70–99)

## 2013-10-06 LAB — PHOSPHORUS: Phosphorus: 1.8 mg/dL — ABNORMAL LOW (ref 2.3–4.6)

## 2013-10-06 LAB — MAGNESIUM: Magnesium: 1.9 mg/dL (ref 1.5–2.5)

## 2013-10-06 LAB — TRIGLYCERIDES: Triglycerides: 98 mg/dL (ref <150)

## 2013-10-06 MED ORDER — TRACE MINERALS CR-CU-F-FE-I-MN-MO-SE-ZN IV SOLN
INTRAVENOUS | Status: AC
  Administered 2013-10-06: 17:00:00 via INTRAVENOUS
  Filled 2013-10-06: qty 2000

## 2013-10-06 MED ORDER — POTASSIUM PHOSPHATES 15 MMOLE/5ML IV SOLN
20.0000 mmol | Freq: Once | INTRAVENOUS | Status: AC
  Administered 2013-10-06: 20 mmol via INTRAVENOUS
  Filled 2013-10-06: qty 6.67

## 2013-10-06 MED ORDER — FAT EMULSION 20 % IV EMUL
250.0000 mL | INTRAVENOUS | Status: AC
  Administered 2013-10-06: 250 mL via INTRAVENOUS
  Filled 2013-10-06: qty 250

## 2013-10-06 MED ORDER — POTASSIUM CHLORIDE 10 MEQ/50ML IV SOLN
10.0000 meq | INTRAVENOUS | Status: AC
  Administered 2013-10-06 (×2): 10 meq via INTRAVENOUS
  Filled 2013-10-06 (×2): qty 50

## 2013-10-06 MED ORDER — INSULIN ASPART 100 UNIT/ML ~~LOC~~ SOLN
0.0000 [IU] | Freq: Four times a day (QID) | SUBCUTANEOUS | Status: DC
  Administered 2013-10-07 – 2013-10-08 (×3): 1 [IU] via SUBCUTANEOUS

## 2013-10-06 NOTE — Progress Notes (Signed)
INITIAL NUTRITION ASSESSMENT  DOCUMENTATION CODES Per approved criteria  -Not Applicable   INTERVENTION:  TPN per pharmacy  Will continue to monitor   NUTRITION DIAGNOSIS: Inadequate oral intake related to inability to eat as evidenced by NPO status.  Goal: Pt to meet >/= 90% of their estimated nutrition needs   Monitor:  TPN prescription, weights, labs, I/O's   Reason for Assessment: Consult, New TNA/TPN  Admitting Dx: bowel obstruction  ASSESSMENT: 38 YOM with history of bowel obstruction and hiatal hernia presented with nausea, vomiting, diarrhea and abdominal distention. Pharmacy managing TPN for ileus vs pSBO.   PICC line placed 9/1, TPN to started today. Currently receiving Clinimix E 5/15 at 42 ml/hr plus lipids at 10 ml/hr to provide 50 gm protein and 1196 kcals.  Nutrition focused physical exam shows no sign of depletion of muscle mass or body fat.  Labs reviewed: Low K Low BUN  Height: Ht Readings from Last 1 Encounters:  10/06/13 5\' 9"  (1.753 m)    Weight: Wt Readings from Last 1 Encounters:  10/06/13 162 lb (73.483 kg)    Ideal Body Weight: 160 lb  % Ideal Body Weight: 101%  Wt Readings from Last 10 Encounters:  10/06/13 162 lb (73.483 kg)  11/30/12 166 lb 10.7 oz (75.6 kg)  02/23/07 161 lb (73.029 kg)  01/07/07 159 lb (72.122 kg)    Usual Body Weight: 160 lb  % Usual Body Weight: 101%  BMI:  Body mass index is 23.91 kg/(m^2).  Estimated Nutritional Needs: Kcal: 2000-2200 Protein: 110 - 120g Fluid: 2 L/day  Skin: intact  Diet Order: NPO  EDUCATION NEEDS: -No education needs identified at this time   Intake/Output Summary (Last 24 hours) at 10/06/13 1028 Last data filed at 10/06/13 0915  Gross per 24 hour  Intake 495.73 ml  Output    350 ml  Net 145.73 ml    Last BM: 9/1   Labs:   Recent Labs Lab 10/03/13 0423 10/04/13 0952 10/06/13 0538  NA 137 140 141  K 4.4 3.3* 3.6*  CL 102 104 105  CO2 28 25 27   BUN 6  <3* 4*  CREATININE 0.89 0.75 0.75  CALCIUM 7.5* 7.7* 7.6*  MG  --   --  1.9  PHOS  --   --  1.8*  GLUCOSE 121* 112* 116*    CBG (last 3)   Recent Labs  10/06/13 0612  GLUCAP 118*    Scheduled Meds: . antiseptic oral rinse  7 mL Mouth Rinse q12n4p  . chlorhexidine  15 mL Mouth Rinse BID  . enoxaparin (LOVENOX) injection  40 mg Subcutaneous Q24H  . insulin aspart  0-9 Units Subcutaneous 4 times per day  . pantoprazole (PROTONIX) IV  40 mg Intravenous QHS  . potassium chloride  10 mEq Intravenous Q1 Hr x 2  . potassium phosphate IVPB (mmol)  20 mmol Intravenous Once  . tiotropium  18 mcg Inhalation Daily    Continuous Infusions: . dextrose 5 % and 0.45% NaCl    . Marland KitchenTPN (CLINIMIX-E) Adult 42 mL/hr at 10/05/13 2028   And  . fat emulsion 250 mL (10/05/13 2028)  . Marland KitchenTPN (CLINIMIX-E) Adult     And  . fat emulsion      Past Medical History  Diagnosis Date  . Bowel obstruction 10/2009=10/01/2013 X 5  . COPD (chronic obstructive pulmonary disease)   . Shortness of breath   . Pneumonia   . H/O hiatal hernia   . On home oxygen  therapy     "2L q hs" (10/01/2013)    Past Surgical History  Procedure Laterality Date  . Hernia repair      "[facility]"  . Umbilical hernia repair  06/2009    w/mesh  . 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 Appendectomy  10/2009  . Abdominal exploration surgery  06/2009; 10/2009    Clayton Bibles, [place], [place] Licensed Dietitian Nutritionist Pager: [phone]

## 2013-10-06 NOTE — Progress Notes (Signed)
Patient ID: Lonnie Schaefer, male   DOB: 1940-09-30, 73 y.o.   MRN: [id number]    Subjective: Pt still passing flatus and having some BMs  Objective: Vital signs in last 24 hours: Temp:  [98.1 F (36.7 C)-98.3 F (36.8 C)] 98.3 F (36.8 C) (09/02 0540) Pulse Rate:  [63-83] 63 (09/02 0540) Resp:  [16-18] 16 (09/02 0540) BP: (119-127)/(59-88) 120/59 mmHg (09/02 0540) SpO2:  [92 %-98 %] 98 % (09/02 0819) Weight:  [162 lb (73.483 kg)] 162 lb (73.483 kg) (09/02 0900) Last BM Date: 10/05/13  Intake/Output from previous day: 09/01 0701 - 09/02 0700 In: 495.7 [TPN:495.7] Out: 350 [Emesis/NG output:350] Intake/Output this shift:    PE: Abd: soft, mildly distended, some mild tenderness, few BS, NGT with mostly white output from scan contrast yesterday Heart: regular Lungs: CTAB  Lab Results:   Recent Labs  10/04/13 0952  WBC 4.7  HGB 13.6  HCT 40.1  PLT 241   BMET  Recent Labs  10/04/13 0952 10/06/13 0538  NA 140 141  K 3.3* 3.6*  CL 104 105  CO2 25 27  GLUCOSE 112* 116*  BUN <3* 4*  CREATININE 0.75 0.75  CALCIUM 7.7* 7.6*   PT/INR No results found for this basename: LABPROT, INR,  in the last 72 hours CMP     Component Value Date/Time   NA 141 10/06/2013 0538   K 3.6* 10/06/2013 0538   CL 105 10/06/2013 0538   CO2 27 10/06/2013 0538   GLUCOSE 116* 10/06/2013 0538   BUN 4* 10/06/2013 0538   CREATININE 0.75 10/06/2013 0538   CALCIUM 7.6* 10/06/2013 0538   PROT 5.7* 10/06/2013 0538   ALBUMIN 2.6* 10/06/2013 0538   AST 28 10/06/2013 0538   ALT 28 10/06/2013 0538   ALKPHOS 41 10/06/2013 0538   BILITOT 0.6 10/06/2013 0538   GFRNONAA >90 10/06/2013 0538   GFRAA >90 10/06/2013 0538   Lipase     Component Value Date/Time   LIPASE 11 10/01/2013 1030       Studies/Results: Ct Entero Abd/pelvis W/cm  10/05/2013   CLINICAL DATA:  Nausea/vomiting, small bowel distention. History of small bowel obstruction, hiatal hernia status post repair, and appendectomy.  EXAM: CT ABDOMEN AND PELVIS  WITH CONTRAST (ENTEROGRAPHY)  TECHNIQUE: Multidetector CT of the abdomen and pelvis during bolus administration of intravenous contrast. Negative oral contrast VoLumen was given.  CONTRAST:  141mL OMNIPAQUE IOHEXOL 300 MG/ML  SOLN  COMPARISON:  10/01/2013  FINDINGS: Mild patchy opacities at the lung bases, likely atelectasis, right lower lobe pneumonia not excluded. Trace pleural fluid.  Coronary atherosclerosis.  Enteric tube terminates in the distal gastric body.  Liver, spleen, pancreas, and adrenal glands are within normal limits.  Gallbladder is unremarkable. No intrahepatic or extrahepatic ductal dilatation.  11 mm posterior left lower pole renal cyst (series 301/ image 23). No hydronephrosis.  Mild gastric distention. Again noted are multiple mildly prominent loops of proximal small bowel, particularly jejunum, which measure up to 3.7 cm in diameter. No focal transition. Colonic diverticulosis, without evidence of diverticulitis. Colon is fluid-filled and not decompressed.  While the overall appearance is nonspecific, it favors adynamic small bowel ileus or gastroenteritis over partial small bowel obstruction.  Atherosclerotic calcifications of the abdominal aorta and branch vessels.  No abdominopelvic ascites.  No suspicious abdominopelvic lymphadenopathy.  Prostate is unremarkable.  Bladder is within normal limits.  Small fat containing periumbilical hernia (series 21/ image 58).  Degenerative changes of the visualized thoracolumbar spine.  IMPRESSION: Mildly  dilated loops of small bowel without focal transition. Fluid-filled loops of colon.  While nonspecific, this overall appearance favors adynamic small bowel ileus or gastroenteritis over partial small bowel obstruction.   Electronically Signed   By: Julian Hy M.D.   On: 10/05/2013 17:12    Anti-infectives: Anti-infectives   None       Assessment/Plan  1. Adynamic ileus, bowel dysmotility  Plan: 1. Patient's CT enterography shows no  transition point.  Suspect this is adynamic ileus per CT scan. I have contacted [facility] to evaluate the patient to give [place] their opinion and assistance. 2. Hasn't had much out of his NGT since yesterday, but still has some bowel dilatation on scan.  Will leave NGT for now, but can hopefully try to consider another clamping trial soon.  LOS: 5 days    Ellysia Char E 10/06/2013, 10:51 AM Pager: [phone]

## 2013-10-06 NOTE — Consult Note (Signed)
Subjective:   HPI  The patient is a 73 year old male with a history of recurring small bowel obstructions. He was again admitted to the hospital with the same symptoms that he always has when he developed a small bowel obstruction which are abdominal distention, nausea, vomiting, and diarrhea. An NG tube was placed on his admission and he is feeling better now. He states that he first developed a bowel obstruction 4 or 5 years ago and had surgery for this. In looking back over the records on 10/18/2009 he underwent exploratory laparotomy with lysis of adhesions for a small bowel obstruction. He also has had an appendectomy. He states that he has had a total of 5 admissions to the hospital because of small bowel obstructions. He states he has always been treated with NG tube suction. When that failed in the past however he required surgery. Looking back in 2014 he had a CT scan which showed small bowel obstruction. On 10/01/2013 he had a CT scan which showed small bowel obstruction. Yesterday he had a CT enteroscopy which did not show small bowel obstruction a raised the question of an ileus. The surgeons have asked for GI input as to whether or not we feel this might be a GI motility problem rather than recurring bowel obstruction. In between the episodes of obstruction the patient states he feels fine.  Review of Systems No chest pain or shortness of breath  Past Medical History  Diagnosis Date  . Bowel obstruction 10/2009=10/01/2013 X 5  . COPD (chronic obstructive pulmonary disease)   . Shortness of breath   . Pneumonia   . H/O hiatal hernia   . On home oxygen therapy     "2L q hs" (10/01/2013)   Past Surgical History  Procedure Laterality Date  . Hernia repair      "[facility]"  . Umbilical hernia repair  06/2009    w/mesh  . 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 Appendectomy  10/2009  . Abdominal exploration surgery  06/2009; 10/2009   History   Social History  .  Marital Status: Married    Spouse Name: N/A    Number of Children: N/A  . Years of Education: N/A   Occupational History  . Not on file.   Social History Main Topics  . Smoking status: Former Smoker -- 2.00 packs/day for 50 years    Types: Cigarettes    Quit date: 10/15/2001  . Smokeless tobacco: Never Used  . Alcohol Use: Yes     Comment: 10/01/2013 "quit drinking 01/1987"  . Drug Use: No  . Sexual Activity: No   Other Topics Concern  . Not on file   Social History Narrative  . No narrative on file   family history is not on file. Current facility-administered medications:acetaminophen (TYLENOL) suppository 650 mg, 650 mg, Rectal, Q6H PRN, Saverio Danker, PA-C, 650 mg at 10/03/13 2152;  acetaminophen (TYLENOL) tablet 650 mg, 650 mg, Oral, Q6H PRN, Saverio Danker, PA-C;  albuterol (PROVENTIL) (2.5 MG/3ML) 0.083% nebulizer solution 2.5 mg, 2.5 mg, Nebulization, Q6H PRN, Lora Poteet Seay, RPH antiseptic oral rinse (CPC / CETYLPYRIDINIUM CHLORIDE 0.05%) solution 7 mL, 7 mL, Mouth Rinse, q12n4p, Fanny Skates, MD, 7 mL at 10/06/13 1633;  chlorhexidine (PERIDEX) 0.12 % solution 15 mL, 15 mL, Mouth Rinse, BID, Fanny Skates, MD, 15 mL at 10/06/13 0914;  dextrose 5 %-0.45 % sodium chloride infusion, , Intravenous, Continuous, Eudelia Bunch, RPH diphenhydrAMINE (BENADRYL) 12.5 MG/5ML  elixir 12.5-25 mg, 12.5-25 mg, Oral, Q6H PRN, Saverio Danker, PA-C;  diphenhydrAMINE (BENADRYL) injection 12.5-25 mg, 12.5-25 mg, Intravenous, Q6H PRN, Saverio Danker, PA-C, 12.5 mg at 10/02/13 2230;  enoxaparin (LOVENOX) injection 40 mg, 40 mg, Subcutaneous, Q24H, Saverio Danker, PA-C, 40 mg at 10/06/13 1636 fat emulsion 20 % infusion 250 mL, 250 mL, Intravenous, Continuous TPN, Eudelia Bunch, RPH, Last Rate: 10 mL/hr at 10/05/13 2028, 250 mL at 10/05/13 2028;  fat emulsion 20 % infusion 250 mL, 250 mL, Intravenous, Continuous TPN, Thuy Dien Dang, RPH;  insulin aspart (novoLOG) injection 0-9 Units, 0-9 Units,  Subcutaneous, 4 times per day, Thuy Dien Dang, [facility] morphine 2 MG/ML injection 1-4 mg, 1-4 mg, Intravenous, Q2H PRN, Saverio Danker, PA-C, 2 mg at 10/05/13 2230;  ondansetron (ZOFRAN) injection 4 mg, 4 mg, Intravenous, Q6H PRN, Saverio Danker, PA-C, 4 mg at 10/04/13 1547;  pantoprazole (PROTONIX) injection 40 mg, 40 mg, Intravenous, QHS, Saverio Danker, PA-C, 40 mg at 10/05/13 2200 potassium phosphate 20 mmol in dextrose 5 % 500 mL infusion, 20 mmol, Intravenous, Once, [employer], RPH, 20 mmol at 10/06/13 1120;  sodium chloride 0.9 % injection 10-40 mL, 10-40 mL, Intracatheter, PRN, Doreen Salvage, MD, 10 mL at 10/06/13 0534;  tiotropium [facility]) inhalation capsule 18 mcg, 18 mcg, Inhalation, Daily, Saverio Danker, PA-C, 18 mcg at 10/06/13 0818 TPN (CLINIMIX-E) Adult, , Intravenous, Continuous TPN, Eudelia Bunch, RPH, Last Rate: 42 mL/hr at 10/05/13 2028;  TPN (CLINIMIX-E) Adult, , Intravenous, Continuous TPN, Thuy Dien Dang, RPH No Known Allergies   Objective:     BP 129/65  Pulse 68  Temp(Src) 98.2 F (36.8 C) (Oral)  Resp 16  Ht 5\' 9"  (1.753 m)  Wt 73.392 kg (161 lb 12.8 oz)  BMI 23.88 kg/m2  SpO2 97%  He is in no distress  Nonicteric  Heart regular rhythm no murmurs  Lungs clear  Abdomen: Bowel sounds are present, soft, minimal discomfort in the midabdomen but no rebound or guarding  Laboratory No components found with this basename: d1      Assessment:     Small bowel obstruction. This appears to have resolved.  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 In between these episodes he feels fine. I see no clinical evidence that he has an ongoing GI dysmotility. I would not use a prokinetic agent such as Reglan since I don't think this would help and if he obstructs again could pose hazard because it is contraindicated in bowel obstruction.      Plan:     Continue supportive care. He seems to feel  better. We could probably attempt clamping or removal NG tube tomorrow. Lab Results  Component Value Date   HGB 13.6 10/04/2013   HGB 12.0* 10/03/2013   HGB 12.9* 10/02/2013   HCT 40.1 10/04/2013   HCT 35.8* 10/03/2013   HCT 37.7* 10/02/2013   ALKPHOS 41 10/06/2013   ALKPHOS 55 10/01/2013   ALKPHOS 62 06/14/2013   AST 28 10/06/2013   AST 21 10/01/2013   AST 20 06/14/2013   ALT 28 10/06/2013   ALT 12 10/01/2013   ALT 12 06/14/2013      Component Value Date/Time   WBC 4.7 10/04/2013 0952   HGB 13.6 10/04/2013 0952   HCT 40.1 10/04/2013 0952   PLT 241 10/04/2013 0952   ALT 28 10/06/2013 0538   AST 28 10/06/2013 0538   NA 141 10/06/2013 0538   K 3.6* 10/06/2013  0538   CL 105 10/06/2013 0538   CREATININE 0.75 10/06/2013 0538   BUN 4* 10/06/2013 0538   CO2 27 10/06/2013 0538   CALCIUM 7.6* 10/06/2013 0538   PHOS 1.8* 10/06/2013 0538   ALKPHOS 41 10/06/2013 0538

## 2013-10-06 NOTE — Progress Notes (Signed)
PARENTERAL NUTRITION CONSULT NOTE - FOLLOW UP  Pharmacy Consult:  TPN Indication: pSBO vs gastroenteritis, recurrent  No Known Allergies  Patient Measurements: Height: 5\' 9"  (175.3 cm) Weight: 162 lb (73.483 kg) IBW/kg (Calculated) : 70.7  Vital Signs: Temp: 98.3 F (36.8 C) (09/02 0540) Temp src: Oral (09/02 0540) BP: 120/59 mmHg (09/02 0540) Pulse Rate: 63 (09/02 0540) Intake/Output from previous day: 09/01 0701 - 09/02 0700 In: 495.7 [TPN:495.7] Out: 350 [Emesis/NG output:350]  Labs:  Recent Labs  10/04/13 0952  WBC 4.7  HGB 13.6  HCT 40.1  PLT 241     Recent Labs  10/04/13 0952 10/06/13 0538  NA 140 141  K 3.3* 3.6*  CL 104 105  CO2 25 27  GLUCOSE 112* 116*  BUN <3* 4*  CREATININE 0.75 0.75  CALCIUM 7.7* 7.6*  MG  --  1.9  PHOS  --  1.8*  PROT  --  5.7*  ALBUMIN  --  2.6*  AST  --  28  ALT  --  28  ALKPHOS  --  41  BILITOT  --  0.6  TRIG  --  98   The CrCl is unknown because both a height and weight (above a minimum accepted value) are required for this calculation.    Recent Labs  10/06/13 0612  GLUCAP 118*     Insulin Requirements in the past 24 hours:  None - not yet on SSI  Assessment: 70 YOM with history of bowel obstruction and hiatal hernia presented with nausea, vomiting, diarrhea and abdominal distention.  Pharmacy managing TPN for ileus vs pSBO.   GI: hx bowel obstruction / hiatal hernia.  CT showed fluid-filled loops of colon >> adynamic ileus vs gastroenteritis vs pSBO.  +flatus, abd distention Endo: no hx DM - AM glucose WNL Lytes: K+ 3.6, Phos 1.8 Renal: SCr 0.75 (stable) Pulm: COPD - [place] >> RA, Spiriva Cards: no hx - VSS Hepatobil: LFTs / tbili / TG WNL Best Practices: Lovenox, PPI IV TPN Access: PICC placed 10/05/13 TPN day#: 1 (9/1 >> )  Current Nutrition:  TPN  Nutritional Goals:  1800-1900 kCal and 85-100 gm protein daily   Plan:  - Increase Clinimix E 5/15 to 80 ml/hr and continue IVFE at 10 ml/hr.  TPN  will provide 1843 kCal and 96gm protein, meeting 100% of patient's needs. - Daily multivitamin and trace elements - Start sensitive SSI.  D/C if CBGs remain controlled at goal TPN rate - KCL x 2 runs - KPhos 20 mmol IV x 1 - F/U AM labs    Zayli Villafuerte D. Mina Marble, PharmD, BCPS Pager:  [phone] 10/06/2013, 10:24 AM

## 2013-10-06 NOTE — Progress Notes (Signed)
No obvious mechanical transition point.  Surgical intervention may exacerbate present condition. Ask GI to see for  Input.

## 2013-10-06 NOTE — Progress Notes (Signed)
Note/chart reviewed. Agree with note.   Nihira Puello RD, LDN, CNSC [phone] Pager [phone] After Hours Pager   

## 2013-10-07 LAB — COMPREHENSIVE METABOLIC PANEL
ALT: 22 U/L (ref 0–53)
AST: 23 U/L (ref 0–37)
Albumin: 2.7 g/dL — ABNORMAL LOW (ref 3.5–5.2)
Alkaline Phosphatase: 42 U/L (ref 39–117)
Anion gap: 12 (ref 5–15)
BUN: 9 mg/dL (ref 6–23)
CO2: 26 mEq/L (ref 19–32)
Calcium: 8.2 mg/dL — ABNORMAL LOW (ref 8.4–10.5)
Chloride: 104 mEq/L (ref 96–112)
Creatinine, Ser: 0.68 mg/dL (ref 0.50–1.35)
GFR calc Af Amer: >90 mL/min (ref >90)
GFR calc non Af Amer: >90 mL/min (ref >90)
Glucose, Bld: 116 mg/dL — ABNORMAL HIGH (ref 70–99)
Potassium: 3.9 mEq/L (ref 3.7–5.3)
Sodium: 142 mEq/L (ref 137–147)
Total Bilirubin: 0.6 mg/dL (ref 0.3–1.2)
Total Protein: 6 g/dL (ref 6.0–8.3)

## 2013-10-07 LAB — TRIGLYCERIDES: Triglycerides: 108 mg/dL (ref <150)

## 2013-10-07 LAB — GLUCOSE, CAPILLARY
Glucose-Capillary: 127 mg/dL — ABNORMAL HIGH (ref 70–99)
Glucose-Capillary: 122 mg/dL — ABNORMAL HIGH (ref 70–99)
Glucose-Capillary: 103 mg/dL — ABNORMAL HIGH (ref 70–99)
Glucose-Capillary: 98 mg/dL (ref 70–99)

## 2013-10-07 LAB — MAGNESIUM: Magnesium: 2.1 mg/dL (ref 1.5–2.5)

## 2013-10-07 LAB — PREALBUMIN: Prealbumin: 13 mg/dL — ABNORMAL LOW (ref 17.0–34.0)

## 2013-10-07 LAB — PHOSPHORUS: Phosphorus: 2.9 mg/dL (ref 2.3–4.6)

## 2013-10-07 MED ORDER — TRACE MINERALS CR-CU-F-FE-I-MN-MO-SE-ZN IV SOLN
INTRAVENOUS | Status: AC
  Administered 2013-10-07: 17:00:00 via INTRAVENOUS
  Filled 2013-10-07: qty 2000

## 2013-10-07 MED ORDER — FAT EMULSION 20 % IV EMUL
250.0000 mL | INTRAVENOUS | Status: AC
  Administered 2013-10-07: 250 mL via INTRAVENOUS
  Filled 2013-10-07: qty 250

## 2013-10-07 NOTE — Care Management Note (Signed)
  Page 1 of 1   10/07/2013     10:19:44 AM CARE MANAGEMENT NOTE 10/07/2013  Patient:  Lonnie Schaefer, Lonnie Schaefer   Account Number:  [id number]  Date Initiated:  10/07/2013  Documentation initiated by:  Magdalen Spatz  Subjective/Objective Assessment:     Action/Plan:   Anticipated DC Date:     Anticipated DC Plan:  HOME/SELF CARE         Choice offered to / List presented to:             Status of service:   Medicare Important Message given?  YES (If response is "NO", the following Medicare IM given date fields will be blank) Date Medicare IM given:  10/07/2013 Medicare IM given by:  Magdalen Spatz Date Additional Medicare IM given:   Additional Medicare IM given by:    Discharge Disposition:    Per UR Regulation:    If discussed at Long Length of Stay Meetings, dates discussed:   10/07/2013    Comments:

## 2013-10-07 NOTE — Progress Notes (Signed)
[facility] Progress Note  Subjective: Feels better. Passing flatus.  Objective: Vital signs in last 24 hours: Temp:  [97.9 F (36.6 C)-98.8 F (37.1 C)] 97.9 F (36.6 C) (09/03 1318) Pulse Rate:  [68-78] 78 (09/03 1318) Resp:  [16-18] 18 (09/03 1318) BP: (126-134)/(56-71) 134/56 mmHg (09/03 1318) SpO2:  [92 %-96 %] 95 % (09/03 1318) Weight change:    PE: Abdomen with good bowel sounds. Non tender  Lab Results: Results for orders placed during the hospital encounter of 10/01/13 (from the past 24 hour(s))  GLUCOSE, CAPILLARY     Status: Abnormal   Collection Time    10/06/13  6:08 PM      Result Value Ref Range   Glucose-Capillary 118 (*) 70 - 99 mg/dL  GLUCOSE, CAPILLARY     Status: Abnormal   Collection Time    10/06/13 11:53 PM      Result Value Ref Range   Glucose-Capillary 111 (*) 70 - 99 mg/dL  COMPREHENSIVE METABOLIC PANEL     Status: Abnormal   Collection Time    10/07/13  4:45 AM      Result Value Ref Range   Sodium 142  137 - 147 mEq/L   Potassium 3.9  3.7 - 5.3 mEq/L   Chloride 104  96 - 112 mEq/L   CO2 26  19 - 32 mEq/L   Glucose, Bld 116 (*) 70 - 99 mg/dL   BUN 9  6 - 23 mg/dL   Creatinine, Ser 0.68  0.50 - 1.35 mg/dL   Calcium 8.2 (*) 8.4 - 10.5 mg/dL   Total Protein 6.0  6.0 - 8.3 g/dL   Albumin 2.7 (*) 3.5 - 5.2 g/dL   AST 23  0 - 37 U/L   ALT 22  0 - 53 U/L   Alkaline Phosphatase 42  39 - 117 U/L   Total Bilirubin 0.6  0.3 - 1.2 mg/dL   GFR calc non Af Amer >90  >90 mL/min   GFR calc Af Amer >90  >90 mL/min   Anion gap 12  5 - 15  MAGNESIUM     Status: None   Collection Time    10/07/13  4:45 AM      Result Value Ref Range   Magnesium 2.1  1.5 - 2.5 mg/dL  PHOSPHORUS     Status: None   Collection Time    10/07/13  4:45 AM      Result Value Ref Range   Phosphorus 2.9  2.3 - 4.6 mg/dL  TRIGLYCERIDES     Status: None   Collection Time    10/07/13  4:45 AM      Result Value Ref Range   Triglycerides 108  <150 mg/dL  GLUCOSE,  CAPILLARY     Status: Abnormal   Collection Time    10/07/13  5:34 AM      Result Value Ref Range   Glucose-Capillary 127 (*) 70 - 99 mg/dL  GLUCOSE, CAPILLARY     Status: Abnormal   Collection Time    10/07/13 11:11 AM      Result Value Ref Range   Glucose-Capillary 122 (*) 70 - 99 mg/dL   Comment 1 Documented in Chart      Studies/Results: No results found.    Assessment: Resolving SBO  Plan: Clamp NG tube and see how he does.    Takeyah Wieman F 10/07/2013, 2:41 PM

## 2013-10-07 NOTE — Progress Notes (Signed)
PARENTERAL NUTRITION CONSULT NOTE - FOLLOW UP  Pharmacy Consult:  TPN Indication: pSBO vs gastroenteritis, recurrent  No Known Allergies  Patient Measurements: Height: 5\' 9"  (175.3 cm) Weight: 161 lb 12.8 oz (73.392 kg) IBW/kg (Calculated) : 70.7  Vital Signs: Temp: 98.3 F (36.8 C) (09/03 0601) Temp src: Oral (09/03 0601) BP: 126/63 mmHg (09/03 0601) Pulse Rate: 71 (09/03 0601) Intake/Output from previous day: 09/02 0701 - 09/03 0700 In: 0  Out: 251 [Urine:200; Emesis/NG output:50; Stool:1]  Labs: No results found for this basename: WBC, HGB, HCT, PLT, APTT, INR,  in the last 72 hours   Recent Labs  10/06/13 0538 10/07/13 0445  NA 141 142  K 3.6* 3.9  CL 105 104  CO2 27 26  GLUCOSE 116* 116*  BUN 4* 9  CREATININE 0.75 0.68  CALCIUM 7.6* 8.2*  MG 1.9 2.1  PHOS 1.8* 2.9  PROT 5.7* 6.0  ALBUMIN 2.6* 2.7*  AST 28 23  ALT 28 22  ALKPHOS 41 42  BILITOT 0.6 0.6  PREALBUMIN 13.0*  --   TRIG 98 108   Estimated Creatinine Clearance: 85.9 ml/min (by C-G formula based on Cr of 0.68).    Recent Labs  10/06/13 1808 10/06/13 2353 10/07/13 0534  GLUCAP 118* 111* 127*     Insulin Requirements in the past 24 hours:  1 unit ssi  Assessment: 83 YOM with history of bowel obstruction and hiatal hernia presented with nausea, vomiting, diarrhea and abdominal distention.  Pharmacy managing TPN for ileus vs pSBO.   GI: hx bowel obstruction / hiatal hernia.  CT showed fluid-filled loops of colon >> adynamic ileus vs gastroenteritis vs pSBO.  +flatus, abd distention.  Plan clamp NG tube and start clears if tolerates Endo: no hx DM - cbgs good Lytes: K+ 3.9, Phos 2.9 after repletion, Mag 2.1 Renal: SCr 0.68 (stable) Pulm: COPD - [place] >> RA, Spiriva Cards: no hx - VSS Hepatobil: LFTs / tbili / TG WNL Best Practices: Lovenox, PPI IV TPN Access: PICC placed 10/05/13 TPN day#: 2 (9/1 >> )  Current Nutrition:  TPN  Nutritional Goals:  1800-1900 kCal and 85-100 gm protein  daily   Plan:  - Cont Clinimix E 5/15 at 80 ml/hr and continue IVFE at 10 ml/hr.  TPN will provide 1843 kCal and 96gm protein, meeting 100% of patient's needs. May start to wean tomorrow if tolerates diet - Daily multivitamin and trace elements -  F/U AM labs   Excell Seltzer, PharmD Pager:  [phone] 10/07/2013, 10:07 AM

## 2013-10-07 NOTE — Progress Notes (Signed)
  Subjective: No nausea, NGT has been clamped  Objective: Vital signs in last 24 hours: Temp:  [98.2 F (36.8 C)-98.8 F (37.1 C)] 98.3 F (36.8 C) (09/03 0601) Pulse Rate:  [68-75] 71 (09/03 0601) Resp:  [16] 16 (09/03 0601) BP: (126-129)/(63-71) 126/63 mmHg (09/03 0601) SpO2:  [92 %-97 %] 96 % (09/03 0857) Weight:  [161 lb 12.8 oz (73.392 kg)] 161 lb 12.8 oz (73.392 kg) (09/02 1126) Last BM Date: 10/05/13  Intake/Output from previous day: 09/02 0701 - 09/03 0700 In: 0  Out: 251 [Urine:200; Emesis/NG output:50; Stool:1] Intake/Output this shift:    General: alert Lungs: CTA CV: RRR Abd: soft, NT, less distended, very active BS  Lab Results:   Recent Labs  10/04/13 0952  WBC 4.7  HGB 13.6  HCT 40.1  PLT 241   BMET  Recent Labs  10/06/13 0538 10/07/13 0445  NA 141 142  K 3.6* 3.9  CL 105 104  CO2 27 26  GLUCOSE 116* 116*  BUN 4* 9  CREATININE 0.75 0.68  CALCIUM 7.6* 8.2*   PT/INR No results found for this basename: LABPROT, INR,  in the last 72 hours ABG No results found for this basename: PHART, PCO2, PO2, HCO3,  in the last 72 hours  Studies/Results: Ct Entero Abd/pelvis W/cm  10/05/2013   CLINICAL DATA:  Nausea/vomiting, small bowel distention. History of small bowel obstruction, hiatal hernia status post repair, and appendectomy.  EXAM: CT ABDOMEN AND PELVIS WITH CONTRAST (ENTEROGRAPHY)  TECHNIQUE: Multidetector CT of the abdomen and pelvis during bolus administration of intravenous contrast. Negative oral contrast VoLumen was given.  CONTRAST:  158mL OMNIPAQUE IOHEXOL 300 MG/ML  SOLN  COMPARISON:  10/01/2013  FINDINGS: Mild patchy opacities at the lung bases, likely atelectasis, right lower lobe pneumonia not excluded. Trace pleural fluid.  Coronary atherosclerosis.  Enteric tube terminates in the distal gastric body.  Liver, spleen, pancreas, and adrenal glands are within normal limits.  Gallbladder is unremarkable. No intrahepatic or extrahepatic  ductal dilatation.  11 mm posterior left lower pole renal cyst (series 301/ image 23). No hydronephrosis.  Mild gastric distention. Again noted are multiple mildly prominent loops of proximal small bowel, particularly jejunum, which measure up to 3.7 cm in diameter. No focal transition. Colonic diverticulosis, without evidence of diverticulitis. Colon is fluid-filled and not decompressed.  While the overall appearance is nonspecific, it favors adynamic small bowel ileus or gastroenteritis over partial small bowel obstruction.  Atherosclerotic calcifications of the abdominal aorta and branch vessels.  No abdominopelvic ascites.  No suspicious abdominopelvic lymphadenopathy.  Prostate is unremarkable.  Bladder is within normal limits.  Small fat containing periumbilical hernia (series 21/ image 58).  Degenerative changes of the visualized thoracolumbar spine.  IMPRESSION: Mildly dilated loops of small bowel without focal transition. Fluid-filled loops of colon.  While nonspecific, this overall appearance favors adynamic small bowel ileus or gastroenteritis over partial small bowel obstruction.   Electronically Signed   By: Julian Hy M.D.   On: 10/05/2013 17:12    Anti-infectives: Anti-infectives   None      Assessment/Plan: 1. Adynamic ileus, bowel dysmotility  Plan: 1. Patient's CT enterography shows no transition point.  NGT clamped. Check this PM and if tolerating will D/C NGT and try clears. 2. appreciate GI eval  3. VTE - Lovenox  LOS: 6 days    Slayden Mennenga E 10/07/2013

## 2013-10-08 LAB — BASIC METABOLIC PANEL
Anion gap: 10 (ref 5–15)
BUN: 14 mg/dL (ref 6–23)
CO2: 27 mEq/L (ref 19–32)
Calcium: 8.8 mg/dL (ref 8.4–10.5)
Chloride: 104 mEq/L (ref 96–112)
Creatinine, Ser: 0.78 mg/dL (ref 0.50–1.35)
GFR calc Af Amer: >90 mL/min (ref >90)
GFR calc non Af Amer: 89 mL/min — ABNORMAL LOW (ref >90)
Glucose, Bld: 95 mg/dL (ref 70–99)
Potassium: 4.1 mEq/L (ref 3.7–5.3)
Sodium: 141 mEq/L (ref 137–147)

## 2013-10-08 LAB — GLUCOSE, CAPILLARY
Glucose-Capillary: 111 mg/dL — ABNORMAL HIGH (ref 70–99)
Glucose-Capillary: 114 mg/dL — ABNORMAL HIGH (ref 70–99)
Glucose-Capillary: 120 mg/dL — ABNORMAL HIGH (ref 70–99)
Glucose-Capillary: 124 mg/dL — ABNORMAL HIGH (ref 70–99)

## 2013-10-08 MED ORDER — TRACE MINERALS CR-CU-F-FE-I-MN-MO-SE-ZN IV SOLN
INTRAVENOUS | Status: AC
  Administered 2013-10-08: 18:00:00 via INTRAVENOUS
  Filled 2013-10-08: qty 2000

## 2013-10-08 MED ORDER — FAT EMULSION 20 % IV EMUL
250.0000 mL | INTRAVENOUS | Status: AC
  Administered 2013-10-08: 250 mL via INTRAVENOUS
  Filled 2013-10-08: qty 250

## 2013-10-08 MED ORDER — BACITRACIN-NEOMYCIN-POLYMYXIN OINTMENT TUBE
TOPICAL_OINTMENT | CUTANEOUS | Status: DC | PRN
  Filled 2013-10-08 (×2): qty 15

## 2013-10-08 NOTE — Progress Notes (Signed)
PARENTERAL NUTRITION CONSULT NOTE - FOLLOW UP  Pharmacy Consult:  TPN Indication: pSBO vs gastroenteritis, recurrent  No Known Allergies  Patient Measurements: Height: 5\' 9"  (175.3 cm) Weight: 161 lb 12.8 oz (73.392 kg) IBW/kg (Calculated) : 70.7  Vital Signs: Temp: 97.9 F (36.6 C) (09/04 0533) Temp src: Oral (09/04 0533) BP: 117/62 mmHg (09/04 0533) Pulse Rate: 76 (09/04 0533) Intake/Output from previous day: 09/03 0701 - 09/04 0700 In: 2360 [P.O.:960; I.V.:320; TPN:1080] Out: 0   Labs: No results found for this basename: WBC, HGB, HCT, PLT, APTT, INR,  in the last 72 hours   Recent Labs  10/06/13 0538 10/07/13 0445 10/08/13 0500  NA 141 142 141  K 3.6* 3.9 4.1  CL 105 104 104  CO2 27 26 27   GLUCOSE 116* 116* 95  BUN 4* 9 14  CREATININE 0.75 0.68 0.78  CALCIUM 7.6* 8.2* 8.8  MG 1.9 2.1  --   PHOS 1.8* 2.9  --   PROT 5.7* 6.0  --   ALBUMIN 2.6* 2.7*  --   AST 28 23  --   ALT 28 22  --   ALKPHOS 41 42  --   BILITOT 0.6 0.6  --   PREALBUMIN 13.0*  --   --   TRIG 98 108  --    Estimated Creatinine Clearance: 85.9 ml/min (by C-G formula based on Cr of 0.78).    Recent Labs  10/07/13 1757 10/07/13 2328 10/08/13 0531  GLUCAP 103* 98 124*     Insulin Requirements in the past 24 hours:  2 unit ssi  Assessment: 72 YOM with history of bowel obstruction and hiatal hernia presented with nausea, vomiting, diarrhea and abdominal distention.  Pharmacy managing TPN for ileus vs pSBO.   GI: hx bowel obstruction / hiatal hernia.  CT showed fluid-filled loops of colon >> adynamic ileus vs gastroenteritis vs pSBO.  +flatus, abd distention.  NG tube clamped and on clears Endo: no hx DM - cbgs good Lytes: K+ 4.1   Renal: SCr 0.78 (stable) Pulm: COPD - [place] >> RA, Spiriva Cards: no hx - VSS Hepatobil: LFTs / tbili / TG WNL Best Practices: Lovenox, PPI IV TPN Access: PICC placed 10/05/13 TPN day#: 3 (9/1 >> )  Current Nutrition:  TPN  Nutritional Goals:   1800-1900 kCal and 85-100 gm protein daily   Plan:  - Dec Clinimix E 5/15 to 60 ml/hr and continue IVFE at 10 ml/hr.  - Daily multivitamin and trace elements -Follow up diet advancement -dc ssi  Excell Seltzer, PharmD Pager:  [phone] 10/08/2013, 9:43 AM

## 2013-10-08 NOTE — Progress Notes (Signed)
Patient ID: Lonnie Schaefer, male   DOB: 1940-02-16, 73 y.o.   MRN: [id number]    Subjective: Pt feels great today.  Tolerating clear liquids.  Objective: Vital signs in last 24 hours: Temp:  [97.9 F (36.6 C)-98.3 F (36.8 C)] 97.9 F (36.6 C) (09/04 0533) Pulse Rate:  [76-81] 76 (09/04 0533) Resp:  [18-19] 18 (09/04 0533) BP: (117-134)/(56-64) 117/62 mmHg (09/04 0533) SpO2:  [95 %-96 %] 95 % (09/04 0831) Last BM Date: 10/07/13  Intake/Output from previous day: 09/03 0701 - 09/04 0700 In: 2360 [P.O.:960; I.V.:320; TPN:1080] Out: 0  Intake/Output this shift: Total I/O In: 480 [P.O.:480] Out: -   PE: Abd: soft, NT, ND, +BS  Lab Results:  No results found for this basename: WBC, HGB, HCT, PLT,  in the last 72 hours BMET  Recent Labs  10/07/13 0445 10/08/13 0500  NA 142 141  K 3.9 4.1  CL 104 104  CO2 26 27  GLUCOSE 116* 95  BUN 9 14  CREATININE 0.68 0.78  CALCIUM 8.2* 8.8   PT/INR No results found for this basename: LABPROT, INR,  in the last 72 hours CMP     Component Value Date/Time   NA 141 10/08/2013 0500   K 4.1 10/08/2013 0500   CL 104 10/08/2013 0500   CO2 27 10/08/2013 0500   GLUCOSE 95 10/08/2013 0500   BUN 14 10/08/2013 0500   CREATININE 0.78 10/08/2013 0500   CALCIUM 8.8 10/08/2013 0500   PROT 6.0 10/07/2013 0445   ALBUMIN 2.7* 10/07/2013 0445   AST 23 10/07/2013 0445   ALT 22 10/07/2013 0445   ALKPHOS 42 10/07/2013 0445   BILITOT 0.6 10/07/2013 0445   GFRNONAA 89* 10/08/2013 0500   GFRAA >90 10/08/2013 0500   Lipase     Component Value Date/Time   LIPASE 11 10/01/2013 1030       Studies/Results: No results found.  Anti-infectives: Anti-infectives   None       Assessment/Plan  1. PSBO vs dysmotility 2. PCM/TNA  Plan: 1. Advance diet slowly.  Will continue on clear liquids for today.  If he tolerates this will advance to full liquids tomorrow and start to wean his TNA   LOS: 7 days    Leslie Langille E 10/08/2013, 10:58 AM Pager: [phone]

## 2013-10-08 NOTE — Progress Notes (Signed)
Looks ok.  GI provided no new insights.  Will slowly advance diet.

## 2013-10-09 LAB — GLUCOSE, CAPILLARY
Glucose-Capillary: 118 mg/dL — ABNORMAL HIGH (ref 70–99)
Glucose-Capillary: 90 mg/dL (ref 70–99)
Glucose-Capillary: 149 mg/dL — ABNORMAL HIGH (ref 70–99)

## 2013-10-09 MED ORDER — TRACE MINERALS CR-CU-F-FE-I-MN-MO-SE-ZN IV SOLN
INTRAVENOUS | Status: DC
  Administered 2013-10-09: 18:00:00 via INTRAVENOUS
  Filled 2013-10-09: qty 1000

## 2013-10-09 NOTE — Progress Notes (Signed)
  Subjective: No complaints, passing a lot of flatus, having loose stools, tol clears wants to go home  Objective: Vital signs in last 24 hours: Temp:  [97.7 F (36.5 C)-98 F (36.7 C)] 97.7 F (36.5 C) (09/05 0726) Pulse Rate:  [76-81] 76 (09/05 0726) Resp:  [17-18] 17 (09/05 0532) BP: (109-124)/(53-65) 109/57 mmHg (09/05 0726) SpO2:  [97 %-99 %] 97 % (09/05 0726) Last BM Date: 10/08/13  Intake/Output from previous day: 09/04 0701 - 09/05 0700 In: 2230 [P.O.:1080; I.V.:320; TPN:830] Out: -  Intake/Output this shift: Total I/O In: 690 [P.O.:690] Out: -   General appearance: no distress Resp: clear to auscultation bilaterally Cardio: regular rate and rhythm GI: soft, bs present old incision present, nontender nondistended  Lab Results:  No results found for this basename: WBC, HGB, HCT, PLT,  in the last 72 hours BMET  Recent Labs  10/07/13 0445 10/08/13 0500  NA 142 141  K 3.9 4.1  CL 104 104  CO2 26 27  GLUCOSE 116* 95  BUN 9 14  CREATININE 0.68 0.78  CALCIUM 8.2* 8.8    Assessment/Plan: 1. PSBO vs dysmotility  2. PCM/TNA   Plan:  He has return of bowel function, tolerating clear liquids, ct enterography negative Will advance to fulls, half tna today. If does well will advance to soft diet and stop tna tomorrow with possible dc tomorrow afternoon   [facility] 10/09/2013

## 2013-10-09 NOTE — Progress Notes (Signed)
PARENTERAL NUTRITION CONSULT NOTE - FOLLOW UP  Pharmacy Consult:  TPN Indication: pSBO vs gastroenteritis, recurrent  No Known Allergies  Patient Measurements: Height: 5\' 9"  (175.3 cm) Weight: 161 lb 12.8 oz (73.392 kg) IBW/kg (Calculated) : 70.7  Vital Signs: Temp: 97.7 F (36.5 C) (09/05 0726) Temp src: Oral (09/05 0726) BP: 109/57 mmHg (09/05 0726) Pulse Rate: 76 (09/05 0726) Intake/Output from previous day: 09/04 0701 - 09/05 0700 In: 2230 [P.O.:1080; I.V.:320; TPN:830] Out: -   Labs: No results found for this basename: WBC, HGB, HCT, PLT, APTT, INR,  in the last 72 hours   Recent Labs  10/07/13 0445 10/08/13 0500  NA 142 141  K 3.9 4.1  CL 104 104  CO2 26 27  GLUCOSE 116* 95  BUN 9 14  CREATININE 0.68 0.78  CALCIUM 8.2* 8.8  MG 2.1  --   PHOS 2.9  --   PROT 6.0  --   ALBUMIN 2.7*  --   AST 23  --   ALT 22  --   ALKPHOS 42  --   BILITOT 0.6  --   TRIG 108  --    Estimated Creatinine Clearance: 85.9 ml/min (by C-G formula based on Cr of 0.78).    Recent Labs  10/08/13 1725 10/08/13 2352 10/09/13 0533  GLUCAP 120* 111* 118*     Insulin Requirements in the past 24 hours:  ssi dced  Assessment: 42 YOM with history of bowel obstruction and hiatal hernia presented with nausea, vomiting, diarrhea and abdominal distention.  Pharmacy managing TPN for ileus vs pSBO.   GI: hx bowel obstruction / hiatal hernia.  CT showed fluid-filled loops of colon >> adynamic ileus vs gastroenteritis vs pSBO.  +flatus, abd distention.  NG tube clamped and on clears.  Plan to half TNA today and advance to soft diet tomorrow Endo: no hx DM - cbgs good Lytes: no labs today, lytes have been stable Renal: SCr 0.78 (stable) Pulm: COPD - [place] >> RA, Spiriva Cards: no hx - VSS Hepatobil: LFTs / tbili / TG WNL Best Practices: Lovenox, PPI IV TPN Access: PICC placed 10/05/13 TPN day#: 4 (9/1 >> )  Current Nutrition:  TPN  Nutritional Goals:  1800-1900 kCal and 85-100 gm  protein daily   Plan:  - Dec Clinimix E 5/15 to 40 ml/hr.  No lipids today - Daily multivitamin and trace elements -Follow up diet advancement  Excell Seltzer, PharmD Pager:  [phone] 10/09/2013, 8:47 AM

## 2013-10-10 LAB — GLUCOSE, CAPILLARY
Glucose-Capillary: 112 mg/dL — ABNORMAL HIGH (ref 70–99)
Glucose-Capillary: 113 mg/dL — ABNORMAL HIGH (ref 70–99)

## 2013-10-10 NOTE — Progress Notes (Signed)
  Subjective: Passing flatus, small stool, no n/v tol fulls, wants to go home  Objective: Vital signs in last 24 hours: Temp:  [97.6 F (36.4 C)-97.9 F (36.6 C)] 97.9 F (36.6 C) (09/06 0611) Pulse Rate:  [72-88] 72 (09/06 0611) Resp:  [16-20] 16 (09/06 0611) BP: (105-110)/(56-62) 110/56 mmHg (09/06 0611) SpO2:  [96 %-98 %] 96 % (09/06 0611) Last BM Date: 10/08/13  Intake/Output from previous day: 09/05 0701 - 09/06 0700 In: 2610 [P.O.:1650; TPN:960] Out: 775 [Urine:775] Intake/Output this shift:    General appearance: no distress Resp: clear to auscultation bilaterally Cardio: regular rate and rhythm GI: soft nt/nd bs present  BMET  Recent Labs  10/08/13 0500  NA 141  K 4.1  CL 104  CO2 27  GLUCOSE 95  BUN 14  CREATININE 0.78  CALCIUM 8.8    Assessment/Plan: 1. PSBO vs dysmotility  2. PCM/TNA   Plan:  He has return of bowel function, tolerating full liquids now Soft diet now, if tolerates lunch discharge home today with gi follow up Dc tna   [facility] 10/10/2013

## 2013-10-10 NOTE — Progress Notes (Signed)
Discharge instructions given to patient with [employer]. Daughter here to transport patient.

## 2013-10-10 NOTE — Discharge Instructions (Signed)
Ileus The intestine (bowel, or gut) is a long, muscular tube connecting your stomach to your rectum. If the intestine stops working, food cannot pass through. This is called an ileus. This can happen for a variety of reasons. Ileus is a major medical problem that usually requires hospitalization. If your intestine stops working because of a blockage, this is called a bowel obstruction and is a different condition. CAUSES   Surgery in your abdomen. This can last from a few hours to a few days.  An infection or inflammation in the belly (abdomen). This includes inflammation of the lining of the abdomen (peritonitis).  Infection or inflammation in other parts of the body, such as pneumonia or pancreatitis.  Passage of gallstones or kidney stones.  Damage to the nerves or blood vessels which go to the bowel.  Imbalance in the salts in the blood (electrolytes).  Injury to the brain and/or spinal cord.  Medications. Many medications can cause ileus or make it worse. The most common of these are strong pain medications. SYMPTOMS  Symptoms of bowel obstruction come from the bowel inactivity. They may include:  Bloating. Your belly gets bigger (distension).  Pain or discomfort in the abdomen.  Poor appetite, feeling sick to your stomach (nausea), and vomiting.  You may also not be able to hear your normal bowel sounds, such as "growling" in your stomach. DIAGNOSIS   Your history and a physical exam will usually suggest to your caregiver that you have an ileus.  X-rays or a CT scan of your abdomen will confirm the diagnosis. X-rays, CT scans, and lab tests may also suggest the cause. TREATMENT   Rest the intestine until it starts working again. This is most often accomplished by:  Stopping intake of oral food and drink. Dehydration is prevented by using IV (intravenous) fluids.  Sometimes, a nasogastric tube (NG tube) is needed. This is a narrow plastic tube inserted through your nose  and into your stomach. It is connected to suction to keep the stomach emptied out. This also helps treat the nausea and vomiting.  If there is an imbalance in the electrolytes, they are corrected with supplements in your intravenous fluids.  Medications that might make an ileus worse might be stopped.  There are no medications that reliably treat ileus, though your caregiver may suggest a trial of certain medications.  If your condition is slow to resolve, you will be reevaluated to be sure another condition, such as a blockage, is not present. Ileus is common and usually has a good outcome. Depending on the cause of your ileus, it usually can be treated by your caregivers with good results. Sometimes, specialists (surgeons or gastroenterologists) are asked to assist in your care.  HOME CARE INSTRUCTIONS   Follow your caregiver's instructions regarding diet and fluid intake. This will usually include drinking plenty of clear fluids, avoiding alcohol and caffeine, and eating a gentle diet.  Follow your caregiver's instructions regarding activity. A period of rest is sometimes advised before returning to work or school.  Take only medications prescribed by your caregiver. Be especially careful with narcotic pain medication, which can slow your bowel activity and contribute to ileus.  Keep any follow-up appointments with your caregiver or specialists. SEEK MEDICAL CARE IF:   You have a recurrence of nausea, vomiting, or abdominal discomfort.  You develop fever of more than 102 F (38.9 C). SEEK IMMEDIATE MEDICAL CARE IF:   You have severe abdominal pain.  You are unable to keep  fluids down. Document Released: 01/24/2003 Document Revised: 06/07/2013 Document Reviewed: 05/26/2008 [facility] Patient Information 2015 [place], [place]. This information is not intended to replace advice given to you by your health care provider. Make sure you discuss any questions you have with your health care  provider.

## 2013-10-12 NOTE — Discharge Summary (Signed)
Patient ID: Lonnie Schaefer MRN: [id number] DOB/AGE: 05/16/1940 73 y.o.  Admit date: 10/01/2013 Discharge date: 10/10/2013  Procedures: none  Consults: GI  Reason for Admission: This is a 73 yo white male with a h/o bowel obstruction, COPD, who began having abdominal pain on Wednesday along with diarrhea. His diarrhea has persisted and is still having it today. He hasn't had much to eat, just applesauce last night. Before that, he had been eating well. Today he started having emesis around 4am. He denies any fevers or chills. He just had a colonoscopy about a month ago with Dr. Oletta Lamas which was normal according to the patient. He hasn't had an EGD in about 8 years. He states he has had several episodes similar to this one in the last several months. Each time having the same symptoms of N/V/D with bowel dilatation.  He came to the [facility] today for evaluation secondary to pain, nausea, vomiting, and diarrhea. He had a CT scan that showed small bowel dilatation with a gradual transition to decompressed distal ileum. This was felt to be a low grade partial obstruction. We have been asked to see the patient for further evaluation.  Admission Diagnoses:  1. Nausea, vomiting, diarrhea  2. Small bowel dilatation  3. ARI, likely secondary to dehydration  4. Hypotension, likely secondary to dehydration  5. COPD  Hospital Course: The patient was admitted and an NGT was placed for decompression.  The patient continued to have diarrhea while here, arguing against a bowel obstruction.  Due to high NGT output this was kept in for several days.  On HD 3, the patient felt well and output was down.  His NGT was clamped, but he failed his clamping trial and as became nauseated.   His NGT was returned to suction.  This persisted for several more days.  GI was asked to evaluate the patient after he had a CT enterography that showed no evidence of bowel obstruction, but dilated small and large bowel.  We felt as if patient  may have bowel dysmotility, but GI did not recommend any prokinetics.  He began to improve over the next couple of days and his NGT was able to be removed after a secondary clamping trial on day 6. He tolerated this well and his diet was then able to be advanced as tolerated.  He was stable for dc home on day 10.  Discharge Diagnoses:  Active Problems:   Nausea, vomiting and diarrhea   Discharge Medications:   Medication List         albuterol 108 (90 BASE) MCG/ACT inhaler  Commonly known as:  PROVENTIL HFA;VENTOLIN HFA  Inhale 1 puff into the lungs every 6 (six) hours as needed for wheezing.     alendronate 70 MG tablet  Commonly known as:  FOSAMAX  Take 70 mg by mouth every 7 (seven) days. Take with a full glass of water on an empty stomach. Take on Tuesday     aspirin 81 MG chewable tablet  Chew 81 mg by mouth daily.     Calcium Carb-Cholecalciferol 600-400 MG-UNIT Tabs  Take 1 tablet by mouth daily.     cholecalciferol 1000 UNITS tablet  Commonly known as:  VITAMIN D  Take 1,000 Units by mouth daily.     clobetasol ointment 0.05 %  Commonly known as:  TEMOVATE  Apply 1 application topically 2 (two) times daily as needed (dry hands).     OXYGEN  Inhale 2 each into the lungs See  admin instructions. Inhale 2 liters at bedtime     polyethylene glycol packet  Commonly known as:  MIRALAX / GLYCOLAX  Take 17 g by mouth daily.     tiotropium 18 MCG inhalation capsule  Commonly known as:  SPIRIVA  Place 18 mcg into inhaler and inhale daily.        Discharge Instructions:     Follow-up Information   Call Harvey, MD.   Specialty:  Gastroenterology   Contact information:   [address] [place] [address] [phone]       Signed: Henreitta Cea 10/12/2013, 2:14 PM

## 2014-11-23 ENCOUNTER — Other Ambulatory Visit: Payer: Self-pay | Admitting: Otolaryngology

## 2014-11-23 DIAGNOSIS — D34 Benign neoplasm of thyroid gland: Secondary | ICD-10-CM

## 2014-11-23 DIAGNOSIS — C059 Malignant neoplasm of palate, unspecified: Secondary | ICD-10-CM

## 2014-11-25 ENCOUNTER — Ambulatory Visit
Admission: RE | Admit: 2014-11-25 | Discharge: 2014-11-25 | Disposition: A | Discharge status: Home / Self Care | Payer: Medicare Other | Source: Ambulatory Visit | Attending: Otolaryngology | Admitting: Otolaryngology

## 2014-11-25 DIAGNOSIS — D34 Benign neoplasm of thyroid gland: Secondary | ICD-10-CM

## 2014-11-28 ENCOUNTER — Ambulatory Visit
Admission: RE | Admit: 2014-11-28 | Discharge: 2014-11-28 | Disposition: A | Discharge status: Home / Self Care | Payer: Medicare Other | Source: Ambulatory Visit | Attending: Otolaryngology | Admitting: Otolaryngology

## 2014-11-28 DIAGNOSIS — C059 Malignant neoplasm of palate, unspecified: Secondary | ICD-10-CM

## 2016-04-02 ENCOUNTER — Encounter (HOSPITAL_COMMUNITY): Payer: Self-pay

## 2016-04-02 ENCOUNTER — Inpatient Hospital Stay (HOSPITAL_COMMUNITY)
Admission: EM | Admit: 2016-04-02 | Discharge: 2016-04-11 | DRG: 389 | Disposition: A | Discharge status: Home / Self Care | Payer: Medicare Other | Attending: Internal Medicine | Admitting: Internal Medicine

## 2016-04-02 ENCOUNTER — Emergency Department (HOSPITAL_COMMUNITY): Payer: Medicare Other

## 2016-04-02 DIAGNOSIS — D72829 Elevated white blood cell count, unspecified: Secondary | ICD-10-CM | POA: Diagnosis not present

## 2016-04-02 DIAGNOSIS — E876 Hypokalemia: Secondary | ICD-10-CM | POA: Diagnosis present

## 2016-04-02 DIAGNOSIS — R000 Tachycardia, unspecified: Secondary | ICD-10-CM | POA: Diagnosis not present

## 2016-04-02 DIAGNOSIS — Z79899 Other long term (current) drug therapy: Secondary | ICD-10-CM | POA: Diagnosis not present

## 2016-04-02 DIAGNOSIS — M810 Age-related osteoporosis without current pathological fracture: Secondary | ICD-10-CM | POA: Diagnosis present

## 2016-04-02 DIAGNOSIS — Z87891 Personal history of nicotine dependence: Secondary | ICD-10-CM | POA: Diagnosis not present

## 2016-04-02 DIAGNOSIS — Z7982 Long term (current) use of aspirin: Secondary | ICD-10-CM | POA: Diagnosis not present

## 2016-04-02 DIAGNOSIS — R1110 Vomiting, unspecified: Secondary | ICD-10-CM | POA: Diagnosis not present

## 2016-04-02 DIAGNOSIS — K56699 Other intestinal obstruction unspecified as to partial versus complete obstruction: Secondary | ICD-10-CM | POA: Diagnosis not present

## 2016-04-02 DIAGNOSIS — Z8711 Personal history of peptic ulcer disease: Secondary | ICD-10-CM | POA: Diagnosis not present

## 2016-04-02 DIAGNOSIS — E860 Dehydration: Secondary | ICD-10-CM | POA: Diagnosis present

## 2016-04-02 DIAGNOSIS — K5669 Other partial intestinal obstruction: Secondary | ICD-10-CM | POA: Diagnosis not present

## 2016-04-02 DIAGNOSIS — J9611 Chronic respiratory failure with hypoxia: Secondary | ICD-10-CM | POA: Diagnosis present

## 2016-04-02 DIAGNOSIS — R112 Nausea with vomiting, unspecified: Secondary | ICD-10-CM | POA: Diagnosis not present

## 2016-04-02 DIAGNOSIS — Z9889 Other specified postprocedural states: Secondary | ICD-10-CM

## 2016-04-02 DIAGNOSIS — K56609 Unspecified intestinal obstruction, unspecified as to partial versus complete obstruction: Secondary | ICD-10-CM | POA: Diagnosis present

## 2016-04-02 DIAGNOSIS — N179 Acute kidney failure, unspecified: Secondary | ICD-10-CM | POA: Diagnosis present

## 2016-04-02 DIAGNOSIS — Z9981 Dependence on supplemental oxygen: Secondary | ICD-10-CM | POA: Diagnosis not present

## 2016-04-02 DIAGNOSIS — Z9049 Acquired absence of other specified parts of digestive tract: Secondary | ICD-10-CM | POA: Diagnosis not present

## 2016-04-02 DIAGNOSIS — L739 Follicular disorder, unspecified: Secondary | ICD-10-CM | POA: Diagnosis present

## 2016-04-02 DIAGNOSIS — Z4682 Encounter for fitting and adjustment of non-vascular catheter: Secondary | ICD-10-CM | POA: Diagnosis not present

## 2016-04-02 DIAGNOSIS — I493 Ventricular premature depolarization: Secondary | ICD-10-CM | POA: Diagnosis present

## 2016-04-02 DIAGNOSIS — E861 Hypovolemia: Secondary | ICD-10-CM | POA: Diagnosis present

## 2016-04-02 DIAGNOSIS — J449 Chronic obstructive pulmonary disease, unspecified: Secondary | ICD-10-CM | POA: Diagnosis not present

## 2016-04-02 DIAGNOSIS — R7989 Other specified abnormal findings of blood chemistry: Secondary | ICD-10-CM | POA: Diagnosis present

## 2016-04-02 DIAGNOSIS — K56600 Partial intestinal obstruction, unspecified as to cause: Principal | ICD-10-CM | POA: Diagnosis present

## 2016-04-02 DIAGNOSIS — Z0189 Encounter for other specified special examinations: Secondary | ICD-10-CM

## 2016-04-02 DIAGNOSIS — R109 Unspecified abdominal pain: Secondary | ICD-10-CM | POA: Diagnosis not present

## 2016-04-02 HISTORY — DX: Personal history of other diseases of the digestive system: Z87.19

## 2016-04-02 HISTORY — DX: Personal history of peptic ulcer disease: Z87.11

## 2016-04-02 LAB — CBC
HCT: 46.4 % (ref 39.0–52.0)
Hemoglobin: 15.8 g/dL (ref 13.0–17.0)
MCH: 31.7 pg (ref 26.0–34.0)
MCHC: 34.1 g/dL (ref 30.0–36.0)
MCV: 93.2 fL (ref 78.0–100.0)
Platelets: 268 10*3/uL (ref 150–400)
RBC: 4.98 MIL/uL (ref 4.22–5.81)
RDW: 13.2 % (ref 11.5–15.5)
WBC: 16.5 10*3/uL — ABNORMAL HIGH (ref 4.0–10.5)

## 2016-04-02 LAB — COMPREHENSIVE METABOLIC PANEL
ALT: 13 U/L — ABNORMAL LOW (ref 17–63)
AST: 24 U/L (ref 15–41)
Albumin: 3.9 g/dL (ref 3.5–5.0)
Alkaline Phosphatase: 52 U/L (ref 38–126)
Anion gap: 13 (ref 5–15)
BUN: 17 mg/dL (ref 6–20)
CO2: 21 mmol/L — ABNORMAL LOW (ref 22–32)
Calcium: 9.5 mg/dL (ref 8.9–10.3)
Chloride: 103 mmol/L (ref 101–111)
Creatinine, Ser: 1.27 mg/dL — ABNORMAL HIGH (ref 0.61–1.24)
GFR calc Af Amer: >60 mL/min (ref >60)
GFR calc non Af Amer: 54 mL/min — ABNORMAL LOW (ref >60)
Glucose, Bld: 191 mg/dL — ABNORMAL HIGH (ref 65–99)
Potassium: 4.3 mmol/L (ref 3.5–5.1)
Sodium: 137 mmol/L (ref 135–145)
Total Bilirubin: 2.5 mg/dL — ABNORMAL HIGH (ref 0.3–1.2)
Total Protein: 7.2 g/dL (ref 6.5–8.1)

## 2016-04-02 LAB — LIPASE, BLOOD: Lipase: 23 U/L (ref 11–51)

## 2016-04-02 LAB — I-STAT CG4 LACTIC ACID, ED: Lactic Acid, Venous: 1.65 mmol/L (ref 0.5–1.9)

## 2016-04-02 MED ORDER — SODIUM CHLORIDE 0.9 % IV SOLN
INTRAVENOUS | Status: DC
  Administered 2016-04-02: 22:00:00 via INTRAVENOUS

## 2016-04-02 MED ORDER — ONDANSETRON HCL 4 MG/2ML IJ SOLN
INTRAMUSCULAR | Status: AC
  Administered 2016-04-02: 4 mg
  Filled 2016-04-02: qty 2

## 2016-04-02 MED ORDER — ONDANSETRON HCL 4 MG/2ML IJ SOLN: 4.0000 mg | Freq: Once | INTRAMUSCULAR | Status: DC | PRN

## 2016-04-02 MED ORDER — MORPHINE SULFATE (PF) 4 MG/ML IV SOLN
4.0000 mg | Freq: Once | INTRAVENOUS | Status: AC
  Administered 2016-04-02: 4 mg via INTRAVENOUS
  Filled 2016-04-02: qty 1

## 2016-04-02 MED ORDER — LIDOCAINE VISCOUS 2 % MT SOLN
15.0000 mL | Freq: Once | OROMUCOSAL | Status: AC
  Administered 2016-04-03: 15 mL via OROMUCOSAL
  Filled 2016-04-02: qty 15

## 2016-04-02 MED ORDER — IOPAMIDOL (ISOVUE-300) INJECTION 61%
INTRAVENOUS | Status: AC
  Administered 2016-04-02: 100 mL
  Filled 2016-04-02: qty 100

## 2016-04-02 MED ORDER — SODIUM CHLORIDE 0.9 % IV BOLUS (SEPSIS)
1000.0000 mL | Freq: Once | INTRAVENOUS | Status: AC
  Administered 2016-04-02: 1000 mL via INTRAVENOUS

## 2016-04-02 MED ORDER — SODIUM CHLORIDE 0.9 % IV BOLUS (SEPSIS): 1000.0000 mL | Freq: Once | INTRAVENOUS | Status: DC

## 2016-04-02 NOTE — ED Notes (Signed)
Pt returned from CT °

## 2016-04-02 NOTE — ED Triage Notes (Signed)
Pt comes in with complaints of severe abdominal pain, vomiting, diarrhea, and abdominal distention. PT has hx of SBO. He states hes been feeling distended since 0800 but began vomiting at about 1500. Pt pale, tachycardic in triage.

## 2016-04-02 NOTE — ED Provider Notes (Addendum)
[facility] DEPT Provider Note   CSN: [id number] Arrival date & time: 04/02/16  2011     History   Chief Complaint Chief Complaint  Patient presents with  . Abdominal Pain  . Emesis    HPI Lonnie Schaefer is a 76 y.o. male.      The history is provided by the patient.  Abdominal Pain   This is a recurrent problem. The current episode started yesterday. The problem occurs constantly. The problem has been gradually worsening. The pain is associated with an unknown factor. The pain is located in the generalized abdominal region. The quality of the pain is aching, cramping, sharp and shooting. The pain is at a severity of 9/10. The pain is severe. Associated symptoms include anorexia, diarrhea, nausea and vomiting. Pertinent negatives include fever, dysuria and frequency. Nothing aggravates the symptoms. Nothing relieves the symptoms. Past workup includes GI consult, CT scan and surgery. Past medical history comments: History of abdominal exploration surgery, appendectomy, hernia repair, recurrent bowel obstruction.  Emesis   Associated symptoms include abdominal pain and diarrhea. Pertinent negatives include no fever.    Past Medical History:  Diagnosis Date  . Bowel obstruction 10/2009=10/01/2013 X 5  . COPD (chronic obstructive pulmonary disease) ([place])   . H/O hiatal hernia   . On home oxygen therapy    "2L q hs" (10/01/2013)  . Pneumonia   . Shortness of breath     Patient Active Problem List   Diagnosis Date Noted  . Nausea, vomiting and diarrhea 10/01/2013  . Small bowel obstruction 12/04/2012  . COPD UNSPECIFIED 02/23/2007    Past Surgical History:  Procedure Laterality Date  . ABDOMINAL EXPLORATION SURGERY  06/2009; 10/2009  . APPENDECTOMY  10/2009  . HERNIA REPAIR     "naval"  . 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 UMBILICAL HERNIA REPAIR  06/2009   w/mesh       Home Medications    Prior to Admission medications   Medication  Sig Start Date End Date Taking? Authorizing Provider  albuterol (PROVENTIL HFA;VENTOLIN HFA) 108 (90 BASE) MCG/ACT inhaler Inhale 1 puff into the lungs every 6 (six) hours as needed for wheezing.     Historical Provider, MD  alendronate (FOSAMAX) 70 MG tablet Take 70 mg by mouth every 7 (seven) days. Take with a full glass of water on an empty stomach. Take on Tuesday    Historical Provider, MD  aspirin 81 MG chewable tablet Chew 81 mg by mouth daily.    Historical Provider, MD  Calcium Carb-Cholecalciferol 600-400 MG-UNIT TABS Take 1 tablet by mouth daily.     Historical Provider, MD  cholecalciferol (VITAMIN D) 1000 UNITS tablet Take 1,000 Units by mouth daily.    Historical Provider, MD  clobetasol ointment (TEMOVATE) [id number] % Apply 1 application topically 2 (two) times daily as needed (dry hands).     Historical Provider, MD  OXYGEN-HELIUM IN Inhale 2 each into the lungs See admin instructions. Inhale 2 liters at bedtime    Historical Provider, MD  polyethylene glycol (MIRALAX / GLYCOLAX) packet Take 17 g by mouth daily. 12/04/12   Emina Riebock, NP  tiotropium (SPIRIVA) 18 MCG inhalation capsule Place 18 mcg into inhaler and inhale daily.    Historical Provider, MD    Family History No family history on file.  Social History Social History  Substance Use Topics  . Smoking status: Former Smoker    Packs/day: 2.00    Years:  50.00    Types: Cigarettes    Quit date: 10/15/2001  . Smokeless tobacco: Never Used  . Alcohol use Yes     Comment: 10/01/2013 "quit drinking 01/1987"     Allergies   Patient has no known allergies.   Review of Systems Review of Systems  Constitutional: Negative for fever.  Gastrointestinal: Positive for abdominal pain, anorexia, diarrhea, nausea and vomiting.  Genitourinary: Negative for dysuria and frequency.  All other systems reviewed and are negative.    Physical Exam Updated Vital Signs BP 111/73   Pulse 95   Temp 97.8 F (36.6 C) (Oral)    Resp 25   Ht 5\' 10"  (1.778 m)   Wt 164 lb (74.4 kg)   SpO2 93%   BMI 23.53 kg/m   Physical Exam  Constitutional: He is oriented to person, place, and time. He appears well-developed and well-nourished. No distress.  HENT:  Head: Normocephalic and atraumatic.  Mouth/Throat: Oropharynx is clear and moist.  Eyes: Conjunctivae and EOM are normal. Pupils are equal, round, and reactive to light.  Neck: Normal range of motion. Neck supple.  Cardiovascular: Normal rate, regular rhythm and intact distal pulses.   No murmur heard. Pulmonary/Chest: Effort normal and breath sounds normal. No respiratory distress. He has no wheezes. He has no rales.  Abdominal: Soft. He exhibits distension. Bowel sounds are decreased. There is generalized tenderness. There is guarding. There is no rebound. No hernia.  Musculoskeletal: Normal range of motion. He exhibits no edema or tenderness.  Neurological: He is alert and oriented to person, place, and time.  Skin: Skin is warm and dry. No rash noted. No erythema.  Psychiatric: He has a normal mood and affect. His behavior is normal.  Nursing note and vitals reviewed.    ED Treatments / Results  Labs (all labs ordered are listed, but only abnormal results are displayed) Labs Reviewed  COMPREHENSIVE METABOLIC PANEL - Abnormal; Notable for the following:       Result Value   CO2 21 (*)    Glucose, Bld 191 (*)    Creatinine, Ser 1.27 (*)    ALT 13 (*)    Total Bilirubin 2.5 (*)    GFR calc non Af Amer 54 (*)    All other components within normal limits  CBC - Abnormal; Notable for the following:    WBC 16.5 (*)    All other components within normal limits  LIPASE, BLOOD  URINALYSIS, ROUTINE W REFLEX MICROSCOPIC  I-STAT CG4 LACTIC ACID, ED    EKG  EKG Interpretation  Date/Time:  Tuesday April 02 2016 20:22:02 EST Ventricular Rate:  124 PR Interval:  150 QRS Duration: 70 QT Interval:  302 QTC Calculation: 433 R Axis:   0 Text  Interpretation:  Sinus tachycardia with frequent Premature ventricular complexes Septal infarct , age undetermined No significant change since last tracing Confirmed by [facility]  MD, Finnbar Cedillos ([id number]) on 04/02/2016 9:37:49 PM       Radiology Ct Abdomen Pelvis W Contrast  Result Date: 04/02/2016 CLINICAL DATA:  Abdominal pain and vomiting.  Abdominal distention. EXAM: CT ABDOMEN AND PELVIS WITH CONTRAST TECHNIQUE: Multidetector CT imaging of the abdomen and pelvis was performed using the standard protocol following bolus administration of intravenous contrast. CONTRAST:  145mL ISOVUE-300 IOPAMIDOL (ISOVUE-300) INJECTION 61% COMPARISON:  CT 10/05/2013. FINDINGS: Lower chest: Motion limited evaluation. Streaky bibasilar atelectasis. Coronary artery calcifications Hepatobiliary: No focal hepatic lesion. Calcified gallstones within decompressed gallbladder. Pancreas: No ductal dilatation or inflammation. Spleen: Normal in  size without focal abnormality. Small splenule at the hilum. Adrenals/Urinary Tract: No adrenal nodule. Symmetric renal enhancement and excretion on delayed phase imaging. 12 mm partially exophytic low-density lesion from the lower left kidney measuring slightly higher than simple fluid density. Urinary bladder is minimally distended. Stomach/Bowel: Stomach is distended and fluid-filled. Small bowel is dilated and fluid-filled. There is a gradual transition in the central lower abdomen with transition point identified image 71 series 2. Small bowel distal to this is decompressed. Fluid in the cecum and ascending colon. The remainder the colon is relatively decompressed. Multifocal colonic diverticulosis, advanced in the sigmoid colon without acute inflammation. Vascular/Lymphatic: Aortic tortuosity and atherosclerosis without aneurysm. No adenopathy. Atherosclerosis of aortic branch vessels. Reproductive: Prostate gland is normal in size. Other: No significant mesenteric edema or free fluid. No  free air. Fat within both inguinal canals. Postsurgical change of the anterior abdominal wall with minimal fat containing supraumbilical hernia. No inflammation. Musculoskeletal: There are no acute or suspicious osseous abnormalities. IMPRESSION: 1. Small-bowel obstruction with transition point in the mid lower abdomen. 2. Multifocal colonic diverticulosis, advanced in the sigmoid colon, no acute inflammation. 3. Aortic atherosclerosis without aneurysm. Electronically Signed   By: Jeb Levering M.D.   On: 04/02/2016 23:21    Procedures Procedures (including critical care time)  Medications Ordered in ED Medications  ondansetron (ZOFRAN) injection 4 mg (not administered)  morphine 4 MG/ML injection 4 mg (not administered)  0.9 %  sodium chloride infusion ( Intravenous New Bag/Given 04/02/16 2152)  iopamidol (ISOVUE-300) 61 % injection (not administered)  ondansetron (ZOFRAN) 4 MG/2ML injection (4 mg  Given 04/02/16 2034)  sodium chloride 0.9 % bolus 1,000 mL (0 mLs Intravenous Stopped 04/02/16 2151)     Initial Impression / Assessment and Plan / ED Course  I have reviewed the triage vital signs and the nursing notes.  Pertinent labs & imaging results that were available during my care of the patient were reviewed by me and considered in my medical decision making (see chart for details).     Patient is a 76 year old male presenting with 24 hours of abdominal pain which is worsening with vomiting and diarrhea. He has an extensive history of bowel obstructions, recurrent bowel surgeries most recently 2 years ago. He denies any fever or urinary symptoms. On exam he has no evidence of hernias but has diffuse abdominal distention, decreased bowel sounds and tenderness. Labs are significant for mild AKI with a creatinine of 1.23, leukocytosis of 16,000, normal LFTs and lipase. Lactic acid 1.65. Nausea is improved with Zofran. Patient given pain medication and fluids. CT pending  11:48 PM CT is  consistent with a small bowel obstruction. NG tube ordered. Will discuss with general surgery.  12:10 AM Spoke Dr. Barry Dienes who will consult on the pt.  Final Clinical Impressions(s) / ED Diagnoses   Final diagnoses:  SBO (small bowel obstruction)    New Prescriptions New Prescriptions   No medications on file     Blanchie Dessert, MD 04/02/16 2348    Blanchie Dessert, MD 04/03/16 0010

## 2016-04-03 ENCOUNTER — Inpatient Hospital Stay (HOSPITAL_COMMUNITY): Payer: Medicare Other

## 2016-04-03 ENCOUNTER — Encounter (HOSPITAL_COMMUNITY): Payer: Self-pay | Admitting: Family Medicine

## 2016-04-03 DIAGNOSIS — E876 Hypokalemia: Secondary | ICD-10-CM | POA: Diagnosis present

## 2016-04-03 DIAGNOSIS — K56699 Other intestinal obstruction unspecified as to partial versus complete obstruction: Secondary | ICD-10-CM | POA: Diagnosis not present

## 2016-04-03 DIAGNOSIS — Z79899 Other long term (current) drug therapy: Secondary | ICD-10-CM | POA: Diagnosis not present

## 2016-04-03 DIAGNOSIS — E860 Dehydration: Secondary | ICD-10-CM | POA: Diagnosis present

## 2016-04-03 DIAGNOSIS — Z87891 Personal history of nicotine dependence: Secondary | ICD-10-CM | POA: Diagnosis not present

## 2016-04-03 DIAGNOSIS — Z8711 Personal history of peptic ulcer disease: Secondary | ICD-10-CM | POA: Diagnosis not present

## 2016-04-03 DIAGNOSIS — R000 Tachycardia, unspecified: Secondary | ICD-10-CM | POA: Diagnosis present

## 2016-04-03 DIAGNOSIS — L739 Follicular disorder, unspecified: Secondary | ICD-10-CM | POA: Diagnosis present

## 2016-04-03 DIAGNOSIS — I493 Ventricular premature depolarization: Secondary | ICD-10-CM | POA: Diagnosis present

## 2016-04-03 DIAGNOSIS — D72829 Elevated white blood cell count, unspecified: Secondary | ICD-10-CM | POA: Diagnosis present

## 2016-04-03 DIAGNOSIS — N179 Acute kidney failure, unspecified: Secondary | ICD-10-CM

## 2016-04-03 DIAGNOSIS — E861 Hypovolemia: Secondary | ICD-10-CM | POA: Diagnosis present

## 2016-04-03 DIAGNOSIS — Z7982 Long term (current) use of aspirin: Secondary | ICD-10-CM | POA: Diagnosis not present

## 2016-04-03 DIAGNOSIS — K5669 Other partial intestinal obstruction: Secondary | ICD-10-CM | POA: Diagnosis not present

## 2016-04-03 DIAGNOSIS — Z9981 Dependence on supplemental oxygen: Secondary | ICD-10-CM | POA: Diagnosis not present

## 2016-04-03 DIAGNOSIS — M810 Age-related osteoporosis without current pathological fracture: Secondary | ICD-10-CM | POA: Diagnosis present

## 2016-04-03 DIAGNOSIS — K56609 Unspecified intestinal obstruction, unspecified as to partial versus complete obstruction: Secondary | ICD-10-CM | POA: Diagnosis present

## 2016-04-03 DIAGNOSIS — Z9049 Acquired absence of other specified parts of digestive tract: Secondary | ICD-10-CM | POA: Diagnosis not present

## 2016-04-03 DIAGNOSIS — R7989 Other specified abnormal findings of blood chemistry: Secondary | ICD-10-CM | POA: Diagnosis present

## 2016-04-03 DIAGNOSIS — Z4682 Encounter for fitting and adjustment of non-vascular catheter: Secondary | ICD-10-CM | POA: Diagnosis not present

## 2016-04-03 DIAGNOSIS — J449 Chronic obstructive pulmonary disease, unspecified: Secondary | ICD-10-CM | POA: Diagnosis not present

## 2016-04-03 DIAGNOSIS — K56600 Partial intestinal obstruction, unspecified as to cause: Secondary | ICD-10-CM | POA: Diagnosis present

## 2016-04-03 DIAGNOSIS — J9611 Chronic respiratory failure with hypoxia: Secondary | ICD-10-CM | POA: Diagnosis present

## 2016-04-03 DIAGNOSIS — Z9889 Other specified postprocedural states: Secondary | ICD-10-CM | POA: Diagnosis not present

## 2016-04-03 LAB — PROTIME-INR
INR: 1.03
Prothrombin Time: 13.5 seconds (ref 11.4–15.2)

## 2016-04-03 LAB — COMPREHENSIVE METABOLIC PANEL
ALT: 13 U/L — ABNORMAL LOW (ref 17–63)
AST: 18 U/L (ref 15–41)
Albumin: 3.3 g/dL — ABNORMAL LOW (ref 3.5–5.0)
Alkaline Phosphatase: 41 U/L (ref 38–126)
Anion gap: 9 (ref 5–15)
BUN: 19 mg/dL (ref 6–20)
CO2: 21 mmol/L — ABNORMAL LOW (ref 22–32)
Calcium: 8.3 mg/dL — ABNORMAL LOW (ref 8.9–10.3)
Chloride: 108 mmol/L (ref 101–111)
Creatinine, Ser: 1.14 mg/dL (ref 0.61–1.24)
GFR calc Af Amer: >60 mL/min (ref >60)
GFR calc non Af Amer: >60 mL/min (ref >60)
Glucose, Bld: 174 mg/dL — ABNORMAL HIGH (ref 65–99)
Potassium: 4.8 mmol/L (ref 3.5–5.1)
Sodium: 138 mmol/L (ref 135–145)
Total Bilirubin: 2.1 mg/dL — ABNORMAL HIGH (ref 0.3–1.2)
Total Protein: 6.3 g/dL — ABNORMAL LOW (ref 6.5–8.1)

## 2016-04-03 LAB — URINALYSIS, ROUTINE W REFLEX MICROSCOPIC
Bilirubin Urine: NEGATIVE
Glucose, UA: NEGATIVE mg/dL
Hgb urine dipstick: NEGATIVE
Ketones, ur: NEGATIVE mg/dL
Leukocytes, UA: NEGATIVE
Nitrite: NEGATIVE
Protein, ur: NEGATIVE mg/dL
Specific Gravity, Urine: >1.046 — ABNORMAL HIGH (ref 1.005–1.030)
pH: 5 (ref 5.0–8.0)

## 2016-04-03 LAB — CBC WITH DIFFERENTIAL/PLATELET
Basophils Absolute: 0 10*3/uL (ref 0.0–0.1)
Basophils Relative: 0 %
Eosinophils Absolute: 0 10*3/uL (ref 0.0–0.7)
Eosinophils Relative: 0 %
HCT: 42.6 % (ref 39.0–52.0)
Hemoglobin: 14.1 g/dL (ref 13.0–17.0)
Lymphocytes Relative: 5 %
Lymphs Abs: 0.5 10*3/uL — ABNORMAL LOW (ref 0.7–4.0)
MCH: 31 pg (ref 26.0–34.0)
MCHC: 33.1 g/dL (ref 30.0–36.0)
MCV: 93.6 fL (ref 78.0–100.0)
Monocytes Absolute: 0.7 10*3/uL (ref 0.1–1.0)
Monocytes Relative: 7 %
Neutro Abs: 8.9 10*3/uL — ABNORMAL HIGH (ref 1.7–7.7)
Neutrophils Relative %: 88 %
Platelets: 229 10*3/uL (ref 150–400)
RBC: 4.55 MIL/uL (ref 4.22–5.81)
RDW: 13.4 % (ref 11.5–15.5)
WBC: 10.1 10*3/uL (ref 4.0–10.5)

## 2016-04-03 MED ORDER — ALBUTEROL SULFATE (2.5 MG/3ML) 0.083% IN NEBU: 2.5000 mg | INHALATION_SOLUTION | RESPIRATORY_TRACT | Status: DC | PRN

## 2016-04-03 MED ORDER — SODIUM CHLORIDE 0.9 % IV SOLN
INTRAVENOUS | Status: AC
  Administered 2016-04-03: 03:00:00 via INTRAVENOUS

## 2016-04-03 MED ORDER — ACETAMINOPHEN 650 MG RE SUPP: 650.0000 mg | Freq: Four times a day (QID) | RECTAL | Status: DC | PRN

## 2016-04-03 MED ORDER — PHENOL 1.4 % MT LIQD
1.0000 | OROMUCOSAL | Status: DC | PRN
  Administered 2016-04-03: 1 via OROMUCOSAL
  Filled 2016-04-03: qty 177

## 2016-04-03 MED ORDER — ONDANSETRON HCL 4 MG/2ML IJ SOLN
4.0000 mg | Freq: Once | INTRAMUSCULAR | Status: AC
  Administered 2016-04-03: 4 mg via INTRAVENOUS
  Filled 2016-04-03: qty 2

## 2016-04-03 MED ORDER — ENOXAPARIN SODIUM 40 MG/0.4ML ~~LOC~~ SOLN
40.0000 mg | SUBCUTANEOUS | Status: DC
  Administered 2016-04-03 – 2016-04-10 (×8): 40 mg via SUBCUTANEOUS
  Filled 2016-04-03 (×9): qty 0.4

## 2016-04-03 MED ORDER — TIOTROPIUM BROMIDE MONOHYDRATE 18 MCG IN CAPS
18.0000 ug | ORAL_CAPSULE | Freq: Every day | RESPIRATORY_TRACT | Status: DC
  Administered 2016-04-03 – 2016-04-11 (×8): 18 ug via RESPIRATORY_TRACT
  Filled 2016-04-03 (×2): qty 5

## 2016-04-03 MED ORDER — LORAZEPAM 2 MG/ML IJ SOLN
0.5000 mg | Freq: Once | INTRAMUSCULAR | Status: AC
  Administered 2016-04-03: 0.5 mg via INTRAVENOUS
  Filled 2016-04-03: qty 1

## 2016-04-03 MED ORDER — ONDANSETRON HCL 4 MG/2ML IJ SOLN
4.0000 mg | Freq: Four times a day (QID) | INTRAMUSCULAR | Status: DC | PRN
  Administered 2016-04-03 – 2016-04-09 (×9): 4 mg via INTRAVENOUS
  Filled 2016-04-03 (×10): qty 2

## 2016-04-03 MED ORDER — ONDANSETRON HCL 4 MG PO TABS: 4.0000 mg | ORAL_TABLET | Freq: Four times a day (QID) | ORAL | Status: DC | PRN

## 2016-04-03 MED ORDER — MORPHINE SULFATE (PF) 4 MG/ML IV SOLN
1.0000 mg | INTRAVENOUS | Status: DC | PRN
  Administered 2016-04-03: 2 mg via INTRAVENOUS
  Administered 2016-04-03 (×3): 3 mg via INTRAVENOUS
  Administered 2016-04-04: 2 mg via INTRAVENOUS
  Administered 2016-04-04: 3 mg via INTRAVENOUS
  Administered 2016-04-05 – 2016-04-07 (×2): 2 mg via INTRAVENOUS
  Filled 2016-04-03 (×8): qty 1

## 2016-04-03 MED ORDER — ACETAMINOPHEN 325 MG PO TABS: 650.0000 mg | ORAL_TABLET | Freq: Four times a day (QID) | ORAL | Status: DC | PRN

## 2016-04-03 MED ORDER — MORPHINE SULFATE (PF) 4 MG/ML IV SOLN
4.0000 mg | Freq: Once | INTRAVENOUS | Status: AC
  Administered 2016-04-03: 4 mg via INTRAVENOUS
  Filled 2016-04-03: qty 1

## 2016-04-03 MED ORDER — DIATRIZOATE MEGLUMINE & SODIUM 66-10 % PO SOLN
90.0000 mL | Freq: Once | ORAL | Status: AC
  Administered 2016-04-03: 90 mL via NASOGASTRIC
  Filled 2016-04-03 (×2): qty 90

## 2016-04-03 NOTE — Progress Notes (Signed)
Pt admitted to 6N13 via bed from ED.  Pt AAO X4.  Pt on 2L O2 via [place].  Pt has NGT to rt nare clamped. Pt has 18G to Rt AC with fluids infusing.  Pt belongings to bedside.  Report rcvd from Lewis Shock, RN.  Pt has no complaints at the moment.  Will continue to monitor.

## 2016-04-03 NOTE — Care Management Note (Signed)
Case Management Note  Patient Details  Name: Lonnie Schaefer MRN: [id number] Date of Birth: 07-Feb-1940  Subjective/Objective:             Spoke with patient at the bedside. He describes living with his wife, both drive, and living independently. Denies difficulties obtaining medications. Patient has access to walker at home. Uses 2L O2 at night, supplied through [place].  PCP Dr Nonda [facility] [address]       Action/Plan:  CM will continue to follow for DC needs.  Expected Discharge Date:                  Expected Discharge Plan:  Home/Self Care  In-House Referral:     Discharge planning Services  CM Consult  Post Acute Care Choice:    Choice offered to:     DME Arranged:    DME Agency:     HH Arranged:    HH Agency:     Status of Service:  In process, will continue to follow  If discussed at Long Length of Stay Meetings, dates discussed:    Additional Comments:  Carles Collet, RN 04/03/2016, 12:03 PM

## 2016-04-03 NOTE — Consult Note (Signed)
Delayed entry.  Pt seen 8:20 AM 2/28.  Reason for Consult:SBO Referring Physician: Maryan Rued, MD  Lonnie Schaefer is an 76 y.o. male.  HPI: Pt is a 76 yo M who presents with 2-4 days of crampy abdominal pain.  He denies flatus for 3 days and no BM for 4 days.  He denies fever/chills.  He has had significant bloating.  He has history of this in the past.  He was admitted in 2015 for SBO.  He has history of appendectomy, umbilical hernia repair, and "some other kind of surgery."  He states that he feels significantly improved since NGT placement and removal of 750 mL fluid.    Past Medical History:  Diagnosis Date  . Bowel obstruction 06/2009-10/01/2013 X 5; 04/03/2016  . COPD (chronic obstructive pulmonary disease) ([place])   . H/O hiatal hernia   . History of stomach ulcers ~ 1960  . On home oxygen therapy    "2L q hs" (04/03/2016)  . Pneumonia 1990s X 1  . Shortness of breath     Past Surgical History:  Procedure Laterality Date  . ABDOMINAL EXPLORATION SURGERY  06/2009; 10/2009  . APPENDECTOMY  10/2009  . HERNIA REPAIR     "naval"  . 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 UMBILICAL HERNIA REPAIR  06/2009   w/mesh    History reviewed. No pertinent family history.  Social History:  reports that he quit smoking about 14 years ago. His smoking use included Cigarettes. He has a 100.00 pack-year smoking history. He has never used smokeless tobacco. He reports that he drinks alcohol. He reports that he does not use drugs.  Allergies: No Known Allergies  Medications:  Prior to Admission:  Prescriptions Prior to Admission  Medication Sig Dispense Refill Last Dose  . alendronate (FOSAMAX) 70 MG tablet Take 70 mg by mouth every Wednesday.  0 Past Week at Unknown time  . aspirin 81 MG chewable tablet Chew 81 mg by mouth daily.   04/02/2016 at Unknown time  . OXYGEN-HELIUM IN Inhale 2 each into the lungs See admin instructions. Inhale 2 liters at bedtime   04/02/2016 at  Unknown time  . tiotropium (SPIRIVA) 18 MCG inhalation capsule Place 18 mcg into inhaler and inhale daily.   04/02/2016 at Unknown time  . albuterol (PROVENTIL HFA;VENTOLIN HFA) 108 (90 BASE) MCG/ACT inhaler Inhale 1 puff into the lungs every 6 (six) hours as needed for wheezing.    Unknown at Unknown  . polyethylene glycol (MIRALAX / GLYCOLAX) packet Take 17 g by mouth daily. (Patient not taking: Reported on 04/03/2016) 1 each 1 Not Taking at Unknown time    Results for orders placed or performed during the hospital encounter of 04/02/16 (from the past 48 hour(s))  Lipase, blood     Status: None   Collection Time: 04/02/16  8:22 PM  Result Value Ref Range   Lipase 23 11 - 51 U/L  Comprehensive metabolic panel     Status: Abnormal   Collection Time: 04/02/16  8:22 PM  Result Value Ref Range   Sodium 137 135 - 145 mmol/L   Potassium 4.3 3.5 - 5.1 mmol/L   Chloride 103 101 - 111 mmol/L   CO2 21 (L) 22 - 32 mmol/L   Glucose, Bld 191 (H) 65 - 99 mg/dL   BUN 17 6 - 20 mg/dL   Creatinine, Ser 1.27 (H) 0.61 - 1.24 mg/dL   Calcium 9.5 8.9 - 10.3 mg/dL  Total Protein 7.2 6.5 - 8.1 g/dL   Albumin 3.9 3.5 - 5.0 g/dL   AST 24 15 - 41 U/L   ALT 13 (L) 17 - 63 U/L   Alkaline Phosphatase 52 38 - 126 U/L   Total Bilirubin 2.5 (H) 0.3 - 1.2 mg/dL   GFR calc non Af Amer 54 (L) >60 mL/min   GFR calc Af Amer >60 >60 mL/min    Comment: (NOTE) The eGFR has been calculated using the CKD EPI equation. This calculation has not been validated in all clinical situations. eGFR's persistently <60 mL/min signify possible Chronic Kidney Disease.    Anion gap 13 5 - 15  CBC     Status: Abnormal   Collection Time: 04/02/16  8:22 PM  Result Value Ref Range   WBC 16.5 (H) 4.0 - 10.5 K/uL   RBC 4.98 4.22 - 5.81 MIL/uL   Hemoglobin 15.8 13.0 - 17.0 g/dL   HCT 46.4 39.0 - 52.0 %   MCV 93.2 78.0 - 100.0 fL   MCH 31.7 26.0 - 34.0 pg   MCHC 34.1 30.0 - 36.0 g/dL   RDW 13.2 11.5 - 15.5 %   Platelets 268 150 -  400 K/uL  I-Stat CG4 Lactic Acid, ED     Status: None   Collection Time: 04/02/16  9:58 PM  Result Value Ref Range   Lactic Acid, Venous 1.65 0.5 - 1.9 mmol/L  Comprehensive metabolic panel     Status: Abnormal   Collection Time: 04/03/16  4:47 AM  Result Value Ref Range   Sodium 138 135 - 145 mmol/L   Potassium 4.8 3.5 - 5.1 mmol/L   Chloride 108 101 - 111 mmol/L   CO2 21 (L) 22 - 32 mmol/L   Glucose, Bld 174 (H) 65 - 99 mg/dL   BUN 19 6 - 20 mg/dL   Creatinine, Ser 1.14 0.61 - 1.24 mg/dL   Calcium 8.3 (L) 8.9 - 10.3 mg/dL   Total Protein 6.3 (L) 6.5 - 8.1 g/dL   Albumin 3.3 (L) 3.5 - 5.0 g/dL   AST 18 15 - 41 U/L   ALT 13 (L) 17 - 63 U/L   Alkaline Phosphatase 41 38 - 126 U/L   Total Bilirubin 2.1 (H) 0.3 - 1.2 mg/dL   GFR calc non Af Amer >60 >60 mL/min   GFR calc Af Amer >60 >60 mL/min    Comment: (NOTE) The eGFR has been calculated using the CKD EPI equation. This calculation has not been validated in all clinical situations. eGFR's persistently <60 mL/min signify possible Chronic Kidney Disease.    Anion gap 9 5 - 15  CBC WITH DIFFERENTIAL     Status: Abnormal   Collection Time: 04/03/16  4:47 AM  Result Value Ref Range   WBC 10.1 4.0 - 10.5 K/uL   RBC 4.55 4.22 - 5.81 MIL/uL   Hemoglobin 14.1 13.0 - 17.0 g/dL   HCT 42.6 39.0 - 52.0 %   MCV 93.6 78.0 - 100.0 fL   MCH 31.0 26.0 - 34.0 pg   MCHC 33.1 30.0 - 36.0 g/dL   RDW 13.4 11.5 - 15.5 %   Platelets 229 150 - 400 K/uL   Neutrophils Relative % 88 %   Neutro Abs 8.9 (H) 1.7 - 7.7 K/uL   Lymphocytes Relative 5 %   Lymphs Abs 0.5 (L) 0.7 - 4.0 K/uL   Monocytes Relative 7 %   Monocytes Absolute 0.7 0.1 - 1.0 K/uL   Eosinophils Relative  0 %   Eosinophils Absolute 0.0 0.0 - 0.7 K/uL   Basophils Relative 0 %   Basophils Absolute 0.0 0.0 - 0.1 K/uL  Protime-INR     Status: None   Collection Time: 04/03/16  4:47 AM  Result Value Ref Range   Prothrombin Time 13.5 11.4 - 15.2 seconds   INR 1.03   Urinalysis,  Routine w reflex microscopic     Status: Abnormal   Collection Time: 04/03/16  8:12 AM  Result Value Ref Range   Color, Urine YELLOW YELLOW   APPearance CLEAR CLEAR   Specific Gravity, Urine >1.046 (H) 1.005 - 1.030   pH 5.0 5.0 - 8.0   Glucose, UA NEGATIVE NEGATIVE mg/dL   Hgb urine dipstick NEGATIVE NEGATIVE   Bilirubin Urine NEGATIVE NEGATIVE   Ketones, ur NEGATIVE NEGATIVE mg/dL   Protein, ur NEGATIVE NEGATIVE mg/dL   Nitrite NEGATIVE NEGATIVE   Leukocytes, UA NEGATIVE NEGATIVE    Dg Abdomen 1 View  Result Date: 04/03/2016 CLINICAL DATA:  Nasogastric tube placement.  Initial encounter. EXAM: ABDOMEN - 1 VIEW COMPARISON:  CT of the abdomen and pelvis from 04/02/2016 FINDINGS: The patient's enteric tube is noted ending at the mid esophagus, kinked at the side port and extending back superiorly. This should be removed and reinserted. There is persistent distention of small-bowel loops, reflecting small bowel obstruction. Contrast is seen within the bladder. Atelectasis or scarring is noted at the lung bases. No acute osseous abnormalities are seen. IMPRESSION: 1. Enteric tube noted ending at the mid esophagus, kinked at the side port and extending back superiorly. This should be removed and reinserted. 2. Persistent distention of small-bowel loops, reflecting small bowel obstruction. These results were called by telephone at the time of interpretation on 04/03/2016 at 2:25 am to [facility] in the [facility] ER, who verbally acknowledged these results. Electronically Signed   By: Garald Balding M.D.   On: 04/03/2016 02:28   Ct Abdomen Pelvis W Contrast  Result Date: 04/02/2016 CLINICAL DATA:  Abdominal pain and vomiting.  Abdominal distention. EXAM: CT ABDOMEN AND PELVIS WITH CONTRAST TECHNIQUE: Multidetector CT imaging of the abdomen and pelvis was performed using the standard protocol following bolus administration of intravenous contrast. CONTRAST:  144m ISOVUE-300 IOPAMIDOL (ISOVUE-300)  INJECTION 61% COMPARISON:  CT 10/05/2013. FINDINGS: Lower chest: Motion limited evaluation. Streaky bibasilar atelectasis. Coronary artery calcifications Hepatobiliary: No focal hepatic lesion. Calcified gallstones within decompressed gallbladder. Pancreas: No ductal dilatation or inflammation. Spleen: Normal in size without focal abnormality. Small splenule at the hilum. Adrenals/Urinary Tract: No adrenal nodule. Symmetric renal enhancement and excretion on delayed phase imaging. 12 mm partially exophytic low-density lesion from the lower left kidney measuring slightly higher than simple fluid density. Urinary bladder is minimally distended. Stomach/Bowel: Stomach is distended and fluid-filled. Small bowel is dilated and fluid-filled. There is a gradual transition in the central lower abdomen with transition point identified image 71 series 2. Small bowel distal to this is decompressed. Fluid in the cecum and ascending colon. The remainder the colon is relatively decompressed. Multifocal colonic diverticulosis, advanced in the sigmoid colon without acute inflammation. Vascular/Lymphatic: Aortic tortuosity and atherosclerosis without aneurysm. No adenopathy. Atherosclerosis of aortic branch vessels. Reproductive: Prostate gland is normal in size. Other: No significant mesenteric edema or free fluid. No free air. Fat within both inguinal canals. Postsurgical change of the anterior abdominal wall with minimal fat containing supraumbilical hernia. No inflammation. Musculoskeletal: There are no acute or suspicious osseous abnormalities. IMPRESSION: 1. Small-bowel obstruction with transition  point in the mid lower abdomen. 2. Multifocal colonic diverticulosis, advanced in the sigmoid colon, no acute inflammation. 3. Aortic atherosclerosis without aneurysm. Electronically Signed   By: Jeb Levering M.D.   On: 04/02/2016 23:21   Dg Abd Portable 1v-small Bowel Obstruction Protocol-initial, 8 Hr Delay  Result Date:  04/03/2016 CLINICAL DATA:  Small-bowel obstruction, 8 hour delay fell EXAM: PORTABLE ABDOMEN - 1 VIEW COMPARISON:  None. FINDINGS: Enteric contrast has reached large bowel and extends to the distal descending colon. Contrast filled nondistended small bowel loops noted in the lower quadrants of the abdomen. There are gas-filled mildly dilated small bowel loops also in the left lower quadrant of the abdomen. There is no pneumoperitoneum. Gastric tube is seen within nondistended contrast filled stomach. Small amount of contrast also noted along the second portion of duodenum. IMPRESSION: The pattern of oral contrast within small and large bowel suggests the presence of a partial small bowel obstruction. Electronically Signed   By: Ashley Royalty M.D.   On: 04/03/2016 21:17   Dg Abd Portable 1v  Result Date: 04/03/2016 CLINICAL DATA:  Nasogastric tube confirmation. EXAM: PORTABLE ABDOMEN - 1 VIEW COMPARISON:  Abdominal radiograph April 03, 2016 at 0143 hours FINDINGS: Nasogastric tube tip and side-port canal project in proximal to mid stomach. Multiple loops of gas distended small bowel in the included abdomen. Bibasilar atelectasis. Soft tissue planes and included osseous structures are unchanged. IMPRESSION: Repositioned nasogastric tube tip projects in proximal to mid stomach. Partially imaged probable small bowel obstruction. Electronically Signed   By: Elon Alas M.D.   On: 04/03/2016 04:09    Review of Systems  Constitutional: Negative.   HENT: Negative.   Eyes: Negative.   Respiratory: Negative.   Cardiovascular: Negative.   Gastrointestinal: Positive for abdominal pain, nausea and vomiting.  Genitourinary: Negative.   Musculoskeletal: Negative.   Skin: Negative.   Neurological: Negative.   Endo/Heme/Allergies: Negative.   Psychiatric/Behavioral: Negative.    Blood pressure 126/63, pulse 88, temperature 98.7 F (37.1 C), temperature source Oral, resp. rate 17, height '5\' 10"'  (1.778 m),  weight 74.6 kg (164 lb 7.4 oz), SpO2 93 %. Physical Exam  Constitutional: He appears well-developed and well-nourished. No distress.  HENT:  Head: Normocephalic and atraumatic.  Mouth/Throat: Oropharynx is clear and moist.  Eyes: Conjunctivae are normal. Pupils are equal, round, and reactive to light. No scleral icterus.  Neck: Neck supple.  Cardiovascular: Normal rate, regular rhythm and intact distal pulses.   Respiratory: No respiratory distress.  GI: Soft. He exhibits distension. He exhibits no mass. There is no tenderness. There is no rebound and no guarding.  Midline incision around 8 -10 cm in center of abdomen.  Skin: He is not diaphoretic.    Assessment/Plan: SBO  NGT NPO IV fluids Small bowel protocol to assess likelihood of success with non operative tx.    Luie Laneve 04/03/2016, 10:29 PM

## 2016-04-03 NOTE — Progress Notes (Signed)
Pt seen and evaluated earlier this am by my associate. Presenting with recurrent SBO. General surgery on board.   Will reassess next am or sooner should any new concerns arise.  Lonnie Schaefer  VSS Gen: pt in nad, alert and awake Cv: s1 and s2 wnl, no rubs Pulm: no increased wob, no wheezes

## 2016-04-03 NOTE — Progress Notes (Signed)
Patient ID: Lonnie Schaefer, male   DOB: 05/17/1940, 76 y.o.   MRN: [id number]   Full note to follow History/physical/testing c/w SBO, no peritonitis.  SB protocol.

## 2016-04-03 NOTE — H&P (Addendum)
History and Physical    Lonnie Schaefer [id number] DOB: 1940-06-01 DOA: 04/02/2016  PCP: [name], FNP   Patient coming from: Home  Chief Complaint: Abdominal pain, nausea, vomiting  HPI: Lonnie Schaefer is a 76 y.o. male with medical history significant for COPD, chronic hypoxic respiratory failure, and osteoporosis who presents to the emergency department with severe abdominal pain, nausea, and vomiting. She reports that he was in his usual state of health until 03/31/2016 when he noted the insidious development of generalized abdominal discomfort, distention, and nausea. Patient soon began to vomit and his pain worsened. Since that time, patient's symptoms have continued to progress, and he now presents with severe abdominal pain and vomiting. He denies any recent fevers or chills. He reports a couple loose stools since the onset of his symptoms. Symptoms are worse with eating, and there are no alleviating factors identified. Pain is localized to the abdomen and it is constant, but waxing and waning in severity. There is been no hematemesis, melena, or hematochezia. Patient reports experiencing very similar symptoms on multiple occasions in the past, with at least one instance requiring surgery. He has not attempted any interventions or symptoms prior to coming in.   ED Course: Upon arrival to the ED, patient is found to be afebrile, saturating well on room air, tachycardic to the 120s, and with soft blood pressure. EKG features a sinus tachycardia with rate 124, frequent PVCs, and septal Q-wave. Chemistry panels notable for a bicarbonate of 21, glucose of 191, serum creatinine 1.27 (up from a baseline of 0.8), and a total bilirubin of 2.5 (normal recently but elevated in the past). CBC is notable for a leukocytosis to 16,500 and is otherwise normal. Lactic acid is reassuring at 1.65. Urinalysis is ordered and remains pending. CT of the abdomen and pelvis suggests a small bowel obstruction with  transition point in the mid to lower abdomen. No focal lesions are seen on the CT, but calcified stones noted within a compressed gallbladder. NG tube was placed, patient was given a liter of normal saline, 2 doses of IV morphine, and 1 dose of Ativan. Gen. surgery was consulted by the ED physician and advised a medical admission with surgery to follow. Tachycardia resolved with the IV fluid and analgesia and blood pressure improved. There has been no apparent respiratory distress and the patient will be admitted to the medical-surgical unit for ongoing evaluation and management of severe abdominal pain with nausea and vomiting secondary to SBO.   Review of Systems:  All other systems reviewed and apart from HPI, are negative.  Past Medical History:  Diagnosis Date  . Bowel obstruction 10/2009=10/01/2013 X 5  . COPD (chronic obstructive pulmonary disease) ([place])   . H/O hiatal hernia   . On home oxygen therapy    "2L q hs" (10/01/2013)  . Pneumonia   . Shortness of breath     Past Surgical History:  Procedure Laterality Date  . ABDOMINAL EXPLORATION SURGERY  06/2009; 10/2009  . APPENDECTOMY  10/2009  . HERNIA REPAIR     "[facility]"  . 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 UMBILICAL HERNIA REPAIR  06/2009   w/mesh     reports that he quit smoking about 14 years ago. His smoking use included Cigarettes. He has a 100.00 pack-year smoking history. He has never used smokeless tobacco. He reports that he drinks alcohol. He reports that he does not use drugs.  No Known Allergies  History reviewed. No pertinent family history.   Prior to Admission medications   Medication Sig Start Date End Date Taking? Authorizing Provider  alendronate (FOSAMAX) 70 MG tablet Take 70 mg by mouth every Wednesday. 03/25/16  Yes Historical Provider, MD  aspirin 81 MG chewable tablet Chew 81 mg by mouth daily.   Yes Historical Provider, MD  OXYGEN-HELIUM IN Inhale 2 each into the lungs See  admin instructions. Inhale 2 liters at bedtime   Yes Historical Provider, MD  tiotropium (SPIRIVA) 18 MCG inhalation capsule Place 18 mcg into inhaler and inhale daily.   Yes Historical Provider, MD  albuterol (PROVENTIL HFA;VENTOLIN HFA) 108 (90 BASE) MCG/ACT inhaler Inhale 1 puff into the lungs every 6 (six) hours as needed for wheezing.     Historical Provider, MD  polyethylene glycol (MIRALAX / GLYCOLAX) packet Take 17 g by mouth daily. Patient not taking: Reported on 04/03/2016 12/04/12   Erby Pian, NP    Physical Exam: Vitals:   04/03/16 0000 04/03/16 0030 04/03/16 0100 04/03/16 0130  BP: 129/89 112/57 118/68 136/68  Pulse: 102 91 96 101  Resp: 24 23 25 22   Temp:      TempSrc:      SpO2: 96% 96% 95% 93%  Weight:      Height:          Constitutional: NAD, calm, appears uncomfortable, NGT in place, mild jaundice Eyes: PERTLA, lids and conjunctivae normal ENMT: Mucous membranes are moist. Posterior pharynx clear of any exudate or lesions.   Neck: normal, supple, no masses, no thyromegaly Respiratory: clear to auscultation bilaterally, no wheezing, no crackles. Normal respiratory effort.   Cardiovascular: S1 & S2 heard, regular rate and rhythm. No extremity edema. No significant JVD. Abdomen: Moderate distension, soft, tender generally without rebound pain or guarding. Bowel sounds are appreciated.  Musculoskeletal: no clubbing / cyanosis. No joint deformity upper and lower extremities. Normal muscle tone.  Skin: no significant rashes, lesions, ulcers. Warm, dry, well-perfused. Mild jaundice, poor turgor.  Neurologic: CN 2-12 grossly intact. Sensation intact, DTR normal. Strength 5/5 in all 4 limbs.  Psychiatric: Normal judgment and insight. Alert and oriented x 3. Normal mood and affect.     Labs on Admission: I have personally reviewed following labs and imaging studies  CBC:  Recent Labs Lab 04/02/16 2022  WBC 16.5*  HGB 15.8  HCT 46.4  MCV 93.2  PLT [id number]    Basic Metabolic Panel:  Recent Labs Lab 04/02/16 2022  NA 137  K 4.3  CL 103  CO2 21*  GLUCOSE 191*  BUN 17  CREATININE 1.27*  CALCIUM 9.5   GFR: Estimated Creatinine Clearance: 53.5 mL/min (by C-G formula based on SCr of 1.27 mg/dL (H)). Liver Function Tests:  Recent Labs Lab 04/02/16 2022  AST 24  ALT 13*  ALKPHOS 52  BILITOT 2.5*  PROT 7.2  ALBUMIN 3.9    Recent Labs Lab 04/02/16 2022  LIPASE 23   No results for input(s): AMMONIA in the last 168 hours. Coagulation Profile: No results for input(s): INR, PROTIME in the last 168 hours. Cardiac Enzymes: No results for input(s): CKTOTAL, CKMB, CKMBINDEX, TROPONINI in the last 168 hours. BNP (last 3 results) No results for input(s): PROBNP in the last 8760 hours. HbA1C: No results for input(s): HGBA1C in the last 72 hours. CBG: No results for input(s): GLUCAP in the last 168 hours. Lipid Profile: No results for input(s): CHOL, HDL, LDLCALC, TRIG, CHOLHDL, LDLDIRECT in the last 72 hours. Thyroid Function Tests:  No results for input(s): TSH, T4TOTAL, FREET4, T3FREE, THYROIDAB in the last 72 hours. Anemia Panel: No results for input(s): VITAMINB12, FOLATE, FERRITIN, TIBC, IRON, RETICCTPCT in the last 72 hours. Urine analysis:    Component Value Date/Time   COLORURINE AMBER (A) 10/01/2013 1119   APPEARANCEUR HAZY (A) 10/01/2013 1119   LABSPEC 1.030 10/01/2013 1119   PHURINE 5.0 10/01/2013 1119   GLUCOSEU NEGATIVE 10/01/2013 1119   HGBUR NEGATIVE 10/01/2013 1119   BILIRUBINUR SMALL (A) 10/01/2013 1119   KETONESUR 15 (A) 10/01/2013 1119   PROTEINUR NEGATIVE 10/01/2013 1119   UROBILINOGEN 0.2 10/01/2013 1119   NITRITE NEGATIVE 10/01/2013 1119   LEUKOCYTESUR NEGATIVE 10/01/2013 1119   Sepsis Labs: @LABRCNTIP (procalcitonin:4,lacticidven:4) )No results found for this or any previous visit (from the past 240 hour(s)).   Radiological Exams on Admission: Ct Abdomen Pelvis W Contrast  Result Date:  04/02/2016 CLINICAL DATA:  Abdominal pain and vomiting.  Abdominal distention. EXAM: CT ABDOMEN AND PELVIS WITH CONTRAST TECHNIQUE: Multidetector CT imaging of the abdomen and pelvis was performed using the standard protocol following bolus administration of intravenous contrast. CONTRAST:  178mL ISOVUE-300 IOPAMIDOL (ISOVUE-300) INJECTION 61% COMPARISON:  CT 10/05/2013. FINDINGS: Lower chest: Motion limited evaluation. Streaky bibasilar atelectasis. Coronary artery calcifications Hepatobiliary: No focal hepatic lesion. Calcified gallstones within decompressed gallbladder. Pancreas: No ductal dilatation or inflammation. Spleen: Normal in size without focal abnormality. Small splenule at the hilum. Adrenals/Urinary Tract: No adrenal nodule. Symmetric renal enhancement and excretion on delayed phase imaging. 12 mm partially exophytic low-density lesion from the lower left kidney measuring slightly higher than simple fluid density. Urinary bladder is minimally distended. Stomach/Bowel: Stomach is distended and fluid-filled. Small bowel is dilated and fluid-filled. There is a gradual transition in the central lower abdomen with transition point identified image 71 series 2. Small bowel distal to this is decompressed. Fluid in the cecum and ascending colon. The remainder the colon is relatively decompressed. Multifocal colonic diverticulosis, advanced in the sigmoid colon without acute inflammation. Vascular/Lymphatic: Aortic tortuosity and atherosclerosis without aneurysm. No adenopathy. Atherosclerosis of aortic branch vessels. Reproductive: Prostate gland is normal in size. Other: No significant mesenteric edema or free fluid. No free air. Fat within both inguinal canals. Postsurgical change of the anterior abdominal wall with minimal fat containing supraumbilical hernia. No inflammation. Musculoskeletal: There are no acute or suspicious osseous abnormalities. IMPRESSION: 1. Small-bowel obstruction with transition  point in the mid lower abdomen. 2. Multifocal colonic diverticulosis, advanced in the sigmoid colon, no acute inflammation. 3. Aortic atherosclerosis without aneurysm. Electronically Signed   By: Jeb Levering M.D.   On: 04/02/2016 23:21    EKG: Independently reviewed. Sinus tachycardia (rate 124), frequent PVC's, septal Q-wave  Assessment/Plan  1. SBO  - Presents with 3 days progressive abd pain and distension, N/V - Found to have a recurrent SBO on CT with transition point in mid-lower abd - Gen surgery is consulting and much appreciated  - NGT placed, bowel rest, prn anti-emetics, prn analgesia, serial exams    2. COPD  - Stable on admission  - Continue supplemental O2, Spiriva, and prn albuterol   3. Acute kidney infury - SCr is 1.27 on admission, up from apparent baseline 0.8   - Likely prerenal azotemia in setting of SBO with N/V and clinical dehydration  - He was given 1 liter NS in ED and is continued on NS infusion   - Avoid nephrotoxins as possible, renally-dose medications, repeat chemistries in am   4. Leukocytosis  - WBC is 16,500 on  admission without fever or elevated lactic acid  - No cough or dyspnea; no URI sxs; UA pending  - Possibly reactive to the SBO  - Culture if febrile  5. 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

## 2016-04-04 LAB — GLUCOSE, CAPILLARY: Glucose-Capillary: 95 mg/dL (ref 65–99)

## 2016-04-04 MED ORDER — SODIUM CHLORIDE 0.9 % IV SOLN
INTRAVENOUS | Status: DC
  Administered 2016-04-04 – 2016-04-05 (×3): via INTRAVENOUS
  Administered 2016-04-06: 1 mL via INTRAVENOUS
  Administered 2016-04-06 (×2): via INTRAVENOUS
  Administered 2016-04-07 – 2016-04-09 (×3): 1 mL via INTRAVENOUS
  Administered 2016-04-09 – 2016-04-10 (×2): via INTRAVENOUS

## 2016-04-04 NOTE — Progress Notes (Signed)
PROGRESS NOTE    Lonnie Schaefer  [id number] DOB: Apr 11, 1940 DOA: 04/02/2016 PCP: [name], FNP    Brief Narrative:  76 y.o. male with medical history significant for COPD, chronic hypoxic respiratory failure, and osteoporosis who presents to the emergency department with severe abdominal pain, nausea, and vomiting. She reports that he was in his usual state of health until 03/31/2016 when he noted the insidious development of generalized abdominal discomfort, distention, and nausea.  Pt admitted with diagnosis of SBO. General sugery consulted   Assessment & Plan:   Principal Problem:   Small bowel obstruction - Gen. surgery assisting with management -Continue supportive therapy - Gen. surgery advancing diet today plan is to see how patient does with by mouth intake  Active Problems:   COPD (chronic obstructive pulmonary disease) ([facility]) - Compensated continue current medication regimen   Leukocytosis - Resolved with improvement in condition and off antibiotics    Hyperbilirubinemia - Reassess next a.m.    AKI (acute kidney injury) ([place]) - Serum creatinine within normal limits currently. Resolved  DVT prophylaxis: Lovenox Code Status: Full Family Communication: None at bedside Disposition Plan: Pending improvement in condition   Consultants:   General surgery: Dr. Barry Dienes   Procedures: none   Antimicrobials: none   Subjective: Pt reports feeling better.  Objective: Vitals:   04/03/16 2134 04/04/16 0443 04/04/16 0909 04/04/16 1341  BP: 126/63 117/78  (!) 127/49  Pulse: 88 86  95  Resp: 17     Temp: 98.7 F (37.1 C) 98.6 F (37 C)  98.1 F (36.7 C)  TempSrc: Oral Oral  Oral  SpO2: 93% 94% 93% 93%  Weight:      Height:        Intake/Output Summary (Last 24 hours) at 04/04/16 1825 Last data filed at 04/04/16 1440  Gross per 24 hour  Intake              970 ml  Output              440 ml  Net              530 ml   Filed Weights   04/02/16  2021 04/03/16 0937  Weight: 74.4 kg (164 lb) 74.6 kg (164 lb 7.4 oz)    Examination:  General exam: Appears calm and comfortable, in nad. Respiratory system: Clear to auscultation. Respiratory effort normal. Cardiovascular system: S1 & S2 heard, RRR. No JVD, murmurs, rubs, gallops or clicks. No pedal edema. Gastrointestinal system: Abdomen is nondistended, soft and nontender. Hypoactive bowel sounds Central nervous system: Alert and oriented. No focal neurological deficits. Extremities: Symmetric 5 x 5 power. Skin: No rashes, lesions or ulcers Psychiatry: Judgement and insight appear normal. Mood & affect appropriate.     Data Reviewed: I have personally reviewed following labs and imaging studies  CBC:  Recent Labs Lab 04/02/16 2022 04/03/16 0447  WBC 16.5* 10.1  NEUTROABS  --  8.9*  HGB 15.8 14.1  HCT 46.4 42.6  MCV 93.2 93.6  PLT 268 [id number]   Basic Metabolic Panel:  Recent Labs Lab 04/02/16 2022 04/03/16 0447  NA 137 138  K 4.3 4.8  CL 103 108  CO2 21* 21*  GLUCOSE 191* 174*  BUN 17 19  CREATININE 1.27* 1.14  CALCIUM 9.5 8.3*   GFR: Estimated Creatinine Clearance: 59.6 mL/min (by C-G formula based on SCr of 1.14 mg/dL). Liver Function Tests:  Recent Labs Lab 04/02/16 2022 04/03/16 0447  AST 24 18  ALT  13* 13*  ALKPHOS 52 41  BILITOT 2.5* 2.1*  PROT 7.2 6.3*  ALBUMIN 3.9 3.3*    Recent Labs Lab 04/02/16 2022  LIPASE 23   No results for input(s): AMMONIA in the last 168 hours. Coagulation Profile:  Recent Labs Lab 04/03/16 0447  INR 1.03   Cardiac Enzymes: No results for input(s): CKTOTAL, CKMB, CKMBINDEX, TROPONINI in the last 168 hours. BNP (last 3 results) No results for input(s): PROBNP in the last 8760 hours. HbA1C: No results for input(s): HGBA1C in the last 72 hours. CBG:  Recent Labs Lab 04/04/16 0746  GLUCAP 95   Lipid Profile: No results for input(s): CHOL, HDL, LDLCALC, TRIG, CHOLHDL, LDLDIRECT in the last 72  hours. Thyroid Function Tests: No results for input(s): TSH, T4TOTAL, FREET4, T3FREE, THYROIDAB in the last 72 hours. Anemia Panel: No results for input(s): VITAMINB12, FOLATE, FERRITIN, TIBC, IRON, RETICCTPCT in the last 72 hours. Sepsis Labs:  Recent Labs Lab 04/02/16 2158  LATICACIDVEN 1.65    No results found for this or any previous visit (from the past 240 hour(s)).    Radiology Studies: Dg Abdomen 1 View  Result Date: 04/03/2016 CLINICAL DATA:  Nasogastric tube placement.  Initial encounter. EXAM: ABDOMEN - 1 VIEW COMPARISON:  CT of the abdomen and pelvis from 04/02/2016 FINDINGS: The patient's enteric tube is noted ending at the mid esophagus, kinked at the side port and extending back superiorly. This should be removed and reinserted. There is persistent distention of small-bowel loops, reflecting small bowel obstruction. Contrast is seen within the bladder. Atelectasis or scarring is noted at the lung bases. No acute osseous abnormalities are seen. IMPRESSION: 1. Enteric tube noted ending at the mid esophagus, kinked at the side port and extending back superiorly. This should be removed and reinserted. 2. Persistent distention of small-bowel loops, reflecting small bowel obstruction. These results were called by telephone at the time of interpretation on 04/03/2016 at 2:25 am to [facility] in the [facility] ER, who verbally acknowledged these results. Electronically Signed   By: Garald Balding M.D.   On: 04/03/2016 02:28   Ct Abdomen Pelvis W Contrast  Result Date: 04/02/2016 CLINICAL DATA:  Abdominal pain and vomiting.  Abdominal distention. EXAM: CT ABDOMEN AND PELVIS WITH CONTRAST TECHNIQUE: Multidetector CT imaging of the abdomen and pelvis was performed using the standard protocol following bolus administration of intravenous contrast. CONTRAST:  160mL ISOVUE-300 IOPAMIDOL (ISOVUE-300) INJECTION 61% COMPARISON:  CT 10/05/2013. FINDINGS: Lower chest: Motion limited evaluation.  Streaky bibasilar atelectasis. Coronary artery calcifications Hepatobiliary: No focal hepatic lesion. Calcified gallstones within decompressed gallbladder. Pancreas: No ductal dilatation or inflammation. Spleen: Normal in size without focal abnormality. Small splenule at the hilum. Adrenals/Urinary Tract: No adrenal nodule. Symmetric renal enhancement and excretion on delayed phase imaging. 12 mm partially exophytic low-density lesion from the lower left kidney measuring slightly higher than simple fluid density. Urinary bladder is minimally distended. Stomach/Bowel: Stomach is distended and fluid-filled. Small bowel is dilated and fluid-filled. There is a gradual transition in the central lower abdomen with transition point identified image 71 series 2. Small bowel distal to this is decompressed. Fluid in the cecum and ascending colon. The remainder the colon is relatively decompressed. Multifocal colonic diverticulosis, advanced in the sigmoid colon without acute inflammation. Vascular/Lymphatic: Aortic tortuosity and atherosclerosis without aneurysm. No adenopathy. Atherosclerosis of aortic branch vessels. Reproductive: Prostate gland is normal in size. Other: No significant mesenteric edema or free fluid. No free air. Fat within both inguinal canals. Postsurgical change  of the anterior abdominal wall with minimal fat containing supraumbilical hernia. No inflammation. Musculoskeletal: There are no acute or suspicious osseous abnormalities. IMPRESSION: 1. Small-bowel obstruction with transition point in the mid lower abdomen. 2. Multifocal colonic diverticulosis, advanced in the sigmoid colon, no acute inflammation. 3. Aortic atherosclerosis without aneurysm. Electronically Signed   By: Jeb Levering M.D.   On: 04/02/2016 23:21   Dg Abd Portable 1v-small Bowel Obstruction Protocol-initial, 8 Hr Delay  Result Date: 04/03/2016 CLINICAL DATA:  Small-bowel obstruction, 8 hour delay fell EXAM: PORTABLE ABDOMEN -  1 VIEW COMPARISON:  None. FINDINGS: Enteric contrast has reached large bowel and extends to the distal descending colon. Contrast filled nondistended small bowel loops noted in the lower quadrants of the abdomen. There are gas-filled mildly dilated small bowel loops also in the left lower quadrant of the abdomen. There is no pneumoperitoneum. Gastric tube is seen within nondistended contrast filled stomach. Small amount of contrast also noted along the second portion of duodenum. IMPRESSION: The pattern of oral contrast within small and large bowel suggests the presence of a partial small bowel obstruction. Electronically Signed   By: Ashley Royalty M.D.   On: 04/03/2016 21:17   Dg Abd Portable 1v  Result Date: 04/03/2016 CLINICAL DATA:  Nasogastric tube confirmation. EXAM: PORTABLE ABDOMEN - 1 VIEW COMPARISON:  Abdominal radiograph April 03, 2016 at 0143 hours FINDINGS: Nasogastric tube tip and side-port canal project in proximal to mid stomach. Multiple loops of gas distended small bowel in the included abdomen. Bibasilar atelectasis. Soft tissue planes and included osseous structures are unchanged. IMPRESSION: Repositioned nasogastric tube tip projects in proximal to mid stomach. Partially imaged probable small bowel obstruction. Electronically Signed   By: Elon Alas M.D.   On: 04/03/2016 04:09   Scheduled Meds: . enoxaparin (LOVENOX) injection  40 mg Subcutaneous Q24H  . tiotropium  18 mcg Inhalation Daily   Continuous Infusions: . sodium chloride 100 mL/hr at 04/04/16 1440     LOS: 1 day    Time spent: > 35 minutes  Velvet Bathe, MD [facility] Pager [phone]  If 7PM-7AM, please contact night-coverage [email or web address] Password [facility] 04/04/2016, 6:25 PM

## 2016-04-04 NOTE — Progress Notes (Signed)
Subjective: Reports pain much less Feels like he could have a BM Wants NG out  Objective: Vital signs in last 24 hours: Temp:  [98 F (36.7 C)-98.7 F (37.1 C)] 98.6 F (37 C) (03/01 0443) Pulse Rate:  [83-88] 86 (03/01 0443) Resp:  [16-17] 17 (02/28 2134) BP: (117-126)/(63-78) 117/78 (03/01 0443) SpO2:  [88 %-94 %] 93 % (03/01 0909) Last BM Date: 04/01/16  Intake/Output from previous day: 02/28 0701 - 03/01 0700 In: 180 [NG/GT:180] Out: 1375 [Urine:375; Emesis/NG output:300] Intake/Output this shift: No intake/output data recorded.  Exam: Abdomen soft, minimally tender  Lab Results:   Recent Labs  04/02/16 2022 04/03/16 0447  WBC 16.5* 10.1  HGB 15.8 14.1  HCT 46.4 42.6  PLT 268 229   BMET  Recent Labs  04/02/16 2022 04/03/16 0447  NA 137 138  K 4.3 4.8  CL 103 108  CO2 21* 21*  GLUCOSE 191* 174*  BUN 17 19  CREATININE 1.27* 1.14  CALCIUM 9.5 8.3*   PT/INR  Recent Labs  04/03/16 0447  LABPROT 13.5  INR 1.03   ABG No results for input(s): PHART, HCO3 in the last 72 hours.  Invalid input(s): PCO2, PO2  Studies/Results: Dg Abdomen 1 View  Result Date: 04/03/2016 CLINICAL DATA:  Nasogastric tube placement.  Initial encounter. EXAM: ABDOMEN - 1 VIEW COMPARISON:  CT of the abdomen and pelvis from 04/02/2016 FINDINGS: The patient's enteric tube is noted ending at the mid esophagus, kinked at the side port and extending back superiorly. This should be removed and reinserted. There is persistent distention of small-bowel loops, reflecting small bowel obstruction. Contrast is seen within the bladder. Atelectasis or scarring is noted at the lung bases. No acute osseous abnormalities are seen. IMPRESSION: 1. Enteric tube noted ending at the mid esophagus, kinked at the side port and extending back superiorly. This should be removed and reinserted. 2. Persistent distention of small-bowel loops, reflecting small bowel obstruction. These results were called  by telephone at the time of interpretation on 04/03/2016 at 2:25 am to [facility] in the [facility] ER, who verbally acknowledged these results. Electronically Signed   By: Garald Balding M.D.   On: 04/03/2016 02:28   Ct Abdomen Pelvis W Contrast  Result Date: 04/02/2016 CLINICAL DATA:  Abdominal pain and vomiting.  Abdominal distention. EXAM: CT ABDOMEN AND PELVIS WITH CONTRAST TECHNIQUE: Multidetector CT imaging of the abdomen and pelvis was performed using the standard protocol following bolus administration of intravenous contrast. CONTRAST:  140mL ISOVUE-300 IOPAMIDOL (ISOVUE-300) INJECTION 61% COMPARISON:  CT 10/05/2013. FINDINGS: Lower chest: Motion limited evaluation. Streaky bibasilar atelectasis. Coronary artery calcifications Hepatobiliary: No focal hepatic lesion. Calcified gallstones within decompressed gallbladder. Pancreas: No ductal dilatation or inflammation. Spleen: Normal in size without focal abnormality. Small splenule at the hilum. Adrenals/Urinary Tract: No adrenal nodule. Symmetric renal enhancement and excretion on delayed phase imaging. 12 mm partially exophytic low-density lesion from the lower left kidney measuring slightly higher than simple fluid density. Urinary bladder is minimally distended. Stomach/Bowel: Stomach is distended and fluid-filled. Small bowel is dilated and fluid-filled. There is a gradual transition in the central lower abdomen with transition point identified image 71 series 2. Small bowel distal to this is decompressed. Fluid in the cecum and ascending colon. The remainder the colon is relatively decompressed. Multifocal colonic diverticulosis, advanced in the sigmoid colon without acute inflammation. Vascular/Lymphatic: Aortic tortuosity and atherosclerosis without aneurysm. No adenopathy. Atherosclerosis of aortic branch vessels. Reproductive: Prostate gland is normal in size. Other: No  significant mesenteric edema or free fluid. No free air. Fat within both  inguinal canals. Postsurgical change of the anterior abdominal wall with minimal fat containing supraumbilical hernia. No inflammation. Musculoskeletal: There are no acute or suspicious osseous abnormalities. IMPRESSION: 1. Small-bowel obstruction with transition point in the mid lower abdomen. 2. Multifocal colonic diverticulosis, advanced in the sigmoid colon, no acute inflammation. 3. Aortic atherosclerosis without aneurysm. Electronically Signed   By: Jeb Levering M.D.   On: 04/02/2016 23:21   Dg Abd Portable 1v-small Bowel Obstruction Protocol-initial, 8 Hr Delay  Result Date: 04/03/2016 CLINICAL DATA:  Small-bowel obstruction, 8 hour delay fell EXAM: PORTABLE ABDOMEN - 1 VIEW COMPARISON:  None. FINDINGS: Enteric contrast has reached large bowel and extends to the distal descending colon. Contrast filled nondistended small bowel loops noted in the lower quadrants of the abdomen. There are gas-filled mildly dilated small bowel loops also in the left lower quadrant of the abdomen. There is no pneumoperitoneum. Gastric tube is seen within nondistended contrast filled stomach. Small amount of contrast also noted along the second portion of duodenum. IMPRESSION: The pattern of oral contrast within small and large bowel suggests the presence of a partial small bowel obstruction. Electronically Signed   By: Ashley Royalty M.D.   On: 04/03/2016 21:17   Dg Abd Portable 1v  Result Date: 04/03/2016 CLINICAL DATA:  Nasogastric tube confirmation. EXAM: PORTABLE ABDOMEN - 1 VIEW COMPARISON:  Abdominal radiograph April 03, 2016 at 0143 hours FINDINGS: Nasogastric tube tip and side-port canal project in proximal to mid stomach. Multiple loops of gas distended small bowel in the included abdomen. Bibasilar atelectasis. Soft tissue planes and included osseous structures are unchanged. IMPRESSION: Repositioned nasogastric tube tip projects in proximal to mid stomach. Partially imaged probable small bowel obstruction.  Electronically Signed   By: Elon Alas M.D.   On: 04/03/2016 04:09    Anti-infectives: Anti-infectives    None      Assessment/Plan:  Partial SBO  Contrast in the colon on xray.  Will d/c ng and try liquids  LOS: 1 day    Kaitlyn Skowron A 04/04/2016

## 2016-04-05 LAB — COMPREHENSIVE METABOLIC PANEL
ALT: 10 U/L — ABNORMAL LOW (ref 17–63)
AST: 14 U/L — ABNORMAL LOW (ref 15–41)
Albumin: 2.7 g/dL — ABNORMAL LOW (ref 3.5–5.0)
Alkaline Phosphatase: 31 U/L — ABNORMAL LOW (ref 38–126)
Anion gap: 7 (ref 5–15)
BUN: 7 mg/dL (ref 6–20)
CO2: 25 mmol/L (ref 22–32)
Calcium: 7.5 mg/dL — ABNORMAL LOW (ref 8.9–10.3)
Chloride: 107 mmol/L (ref 101–111)
Creatinine, Ser: 0.78 mg/dL (ref 0.61–1.24)
GFR calc Af Amer: >60 mL/min (ref >60)
GFR calc non Af Amer: >60 mL/min (ref >60)
Glucose, Bld: 94 mg/dL (ref 65–99)
Potassium: 3.4 mmol/L — ABNORMAL LOW (ref 3.5–5.1)
Sodium: 139 mmol/L (ref 135–145)
Total Bilirubin: 1 mg/dL (ref 0.3–1.2)
Total Protein: 5.3 g/dL — ABNORMAL LOW (ref 6.5–8.1)

## 2016-04-05 LAB — GLUCOSE, CAPILLARY: Glucose-Capillary: 106 mg/dL — ABNORMAL HIGH (ref 65–99)

## 2016-04-05 MED ORDER — ONDANSETRON HCL 4 MG/2ML IJ SOLN
4.0000 mg | Freq: Once | INTRAMUSCULAR | Status: AC
  Administered 2016-04-05: 4 mg via INTRAVENOUS

## 2016-04-05 NOTE — Progress Notes (Signed)
Patient still vomiting after 4mg  of zofran. Dr. Wendee Beavers notified. Orders received.

## 2016-04-05 NOTE — Progress Notes (Signed)
PROGRESS NOTE    Lonnie Schaefer  [id number] DOB: [birth year] DOA: 04/02/2016 PCP: Lonnie Aly, FNP    Brief Narrative:  76 y.o. male with medical history significant for COPD, chronic hypoxic respiratory failure, and osteoporosis who presents to the emergency department with severe abdominal pain, nausea, and vomiting. She reports that he was in his usual state of health until 03/31/2016 when he noted the insidious development of generalized abdominal discomfort, distention, and nausea.  Pt admitted with diagnosis of SBO. General sugery consulted  Assessment & Plan:   Principal Problem:   Small bowel obstruction - Gen. surgery assisting with management -Continue supportive therapy - Gen. surgery would like to wait until there is more return of normal bowel function  Active Problems:   COPD (chronic obstructive pulmonary disease) ([place]) - Compensated continue current medication regimen   Leukocytosis - Resolved with improvement in condition and off antibiotics    Hyperbilirubinemia - Reassess next a.m.    AKI (acute kidney injury) ([place]) - Serum creatinine within normal limits currently. Resolved  DVT prophylaxis: Lovenox Code Status: Full Family Communication: None at bedside Disposition Plan: with improvmeen in condition most liekly d/c in the next 1-2 days   Consultants:   General surgery: Dr. Barry Dienes   Procedures: none   Antimicrobials: none   Subjective: Pt reports feeling better.  Objective: Vitals:   04/04/16 1341 04/04/16 2059 04/05/16 0519 04/05/16 1329  BP: (!) 127/49 114/62 (!) 110/53 129/72  Pulse: 95 82 78 76  Resp:  17 17 17   Temp: 98.1 F (36.7 C) 99 F (37.2 C) 98.3 F (36.8 C) 98.4 F (36.9 C)  TempSrc: Oral Oral Oral Oral  SpO2: 93% 93% 95% 95%  Weight:      Height:        Intake/Output Summary (Last 24 hours) at 04/05/16 1711 Last data filed at 04/05/16 1431  Gross per 24 hour  Intake          3648.33 ml  Output               600 ml  Net          3048.33 ml   Filed Weights   04/02/16 2021 04/03/16 0937  Weight: 74.4 kg (164 lb) 74.6 kg (164 lb 7.4 oz)    Examination:  General exam: Appears calm and comfortable, in nad. Respiratory system: Clear to auscultation. Respiratory effort normal. Cardiovascular system: S1 & S2 heard, RRR. No JVD, murmurs, rubs, gallops or clicks. No pedal edema. Gastrointestinal system: Abdomen is nondistended, soft and nontender. Hypoactive bowel sounds Central nervous system: Alert and oriented. No focal neurological deficits. Extremities: Symmetric 5 x 5 power. Skin: No rashes, lesions or ulcers Psychiatry: Judgement and insight appear normal. Mood & affect appropriate.     Data Reviewed: I have personally reviewed following labs and imaging studies  CBC:  Recent Labs Lab 04/02/16 2022 04/03/16 0447  WBC 16.5* 10.1  NEUTROABS  --  8.9*  HGB 15.8 14.1  HCT 46.4 42.6  MCV 93.2 93.6  PLT 268 [id number]   Basic Metabolic Panel:  Recent Labs Lab 04/02/16 2022 04/03/16 0447 04/05/16 0518  NA 137 138 139  K 4.3 4.8 3.4*  CL 103 108 107  CO2 21* 21* 25  GLUCOSE 191* 174* 94  BUN 17 19 7   CREATININE 1.27* 1.14 0.78  CALCIUM 9.5 8.3* 7.5*   GFR: Estimated Creatinine Clearance: 84.9 mL/min (by C-G formula based on SCr of 0.78 mg/dL). Liver Function Tests:  Recent  Labs Lab 04/02/16 2022 04/03/16 0447 04/05/16 0518  AST 24 18 14*  ALT 13* 13* 10*  ALKPHOS 52 41 31*  BILITOT 2.5* 2.1* 1.0  PROT 7.2 6.3* 5.3*  ALBUMIN 3.9 3.3* 2.7*    Recent Labs Lab 04/02/16 2022  LIPASE 23   No results for input(s): AMMONIA in the last 168 hours. Coagulation Profile:  Recent Labs Lab 04/03/16 0447  INR 1.03   Cardiac Enzymes: No results for input(s): CKTOTAL, CKMB, CKMBINDEX, TROPONINI in the last 168 hours. BNP (last 3 results) No results for input(s): PROBNP in the last 8760 hours. HbA1C: No results for input(s): HGBA1C in the last 72 hours. CBG:  Recent  Labs Lab 04/04/16 0746 04/05/16 0752  GLUCAP 95 106*   Lipid Profile: No results for input(s): CHOL, HDL, LDLCALC, TRIG, CHOLHDL, LDLDIRECT in the last 72 hours. Thyroid Function Tests: No results for input(s): TSH, T4TOTAL, FREET4, T3FREE, THYROIDAB in the last 72 hours. Anemia Panel: No results for input(s): VITAMINB12, FOLATE, FERRITIN, TIBC, IRON, RETICCTPCT in the last 72 hours. Sepsis Labs:  Recent Labs Lab 04/02/16 2158  LATICACIDVEN 1.65    No results found for this or any previous visit (from the past 240 hour(s)).    Radiology Studies: Dg Abd Portable 1v-small Bowel Obstruction Protocol-initial, 8 Hr Delay  Result Date: 04/03/2016 CLINICAL DATA:  Small-bowel obstruction, 8 hour delay fell EXAM: PORTABLE ABDOMEN - 1 VIEW COMPARISON:  None. FINDINGS: Enteric contrast has reached large bowel and extends to the distal descending colon. Contrast filled nondistended small bowel loops noted in the lower quadrants of the abdomen. There are gas-filled mildly dilated small bowel loops also in the left lower quadrant of the abdomen. There is no pneumoperitoneum. Gastric tube is seen within nondistended contrast filled stomach. Small amount of contrast also noted along the second portion of duodenum. IMPRESSION: The pattern of oral contrast within small and large bowel suggests the presence of a partial small bowel obstruction. Electronically Signed   By: Ashley Royalty M.D.   On: 04/03/2016 21:17   Scheduled Meds: . enoxaparin (LOVENOX) injection  40 mg Subcutaneous Q24H  . tiotropium  18 mcg Inhalation Daily   Continuous Infusions: . sodium chloride 100 mL/hr at 04/05/16 1445     LOS: 2 days   Time spent: > 35 minutes  Velvet Bathe, MD [facility] Pager [phone]  If 7PM-7AM, please contact night-coverage [email or web address] Password [email or web address] 04/05/2016, 5:11 PM

## 2016-04-05 NOTE — Progress Notes (Signed)
Patient ID: Lonnie Schaefer, male   DOB: [birth year], 76 y.o.   MRN: [id number]  [facility] Surgery Progress Note     Subjective: Abdomen still sore this morning and distended, less distension than when he arrived. States that he is is still passing a good amount of flatus, but has not had a BM since yesterday morning. Tolerating clear liquids and denies n/v.  Objective: Vital signs in last 24 hours: Temp:  [98.1 F (36.7 C)-99 F (37.2 C)] 98.3 F (36.8 C) (03/02 0519) Pulse Rate:  [78-95] 78 (03/02 0519) Resp:  [17] 17 (03/02 0519) BP: (110-127)/(49-62) 110/53 (03/02 0519) SpO2:  [93 %-95 %] 95 % (03/02 0519) Last BM Date: 04/05/16  Intake/Output from previous day: 03/01 0701 - 03/02 0700 In: 2678.3 [P.O.:390; I.V.:2288.3] Out: -  Intake/Output this shift: No intake/output data recorded.  PE: Gen:  Alert, NAD, pleasant Card:  RRR, no M/G/R heard Pulm:  CTAB, no W/R/R, effort normal Abd: Soft, mild/mod distension, tympanic, few BS, no HSM Ext:  No erythema, edema, or tenderness   Lab Results:   Recent Labs  04/02/16 2022 04/03/16 0447  WBC 16.5* 10.1  HGB 15.8 14.1  HCT 46.4 42.6  PLT 268 229   BMET  Recent Labs  04/03/16 0447 04/05/16 0518  NA 138 139  K 4.8 3.4*  CL 108 107  CO2 21* 25  GLUCOSE 174* 94  BUN 19 7  CREATININE 1.14 0.78  CALCIUM 8.3* 7.5*   PT/INR  Recent Labs  04/03/16 0447  LABPROT 13.5  INR 1.03   CMP     Component Value Date/Time   NA 139 04/05/2016 0518   K 3.4 (L) 04/05/2016 0518   CL 107 04/05/2016 0518   CO2 25 04/05/2016 0518   GLUCOSE 94 04/05/2016 0518   BUN 7 04/05/2016 0518   CREATININE 0.78 04/05/2016 0518   CALCIUM 7.5 (L) 04/05/2016 0518   PROT 5.3 (L) 04/05/2016 0518   ALBUMIN 2.7 (L) 04/05/2016 0518   AST 14 (L) 04/05/2016 0518   ALT 10 (L) 04/05/2016 0518   ALKPHOS 31 (L) 04/05/2016 0518   BILITOT 1.0 04/05/2016 0518   GFRNONAA >60 04/05/2016 0518   GFRAA >60 04/05/2016 0518   Lipase      Component Value Date/Time   LIPASE 23 04/02/2016 2022       Studies/Results: Dg Abd Portable 1v-small Bowel Obstruction Protocol-initial, 8 Hr Delay  Result Date: 04/03/2016 CLINICAL DATA:  Small-bowel obstruction, 8 hour delay fell EXAM: PORTABLE ABDOMEN - 1 VIEW COMPARISON:  None. FINDINGS: Enteric contrast has reached large bowel and extends to the distal descending colon. Contrast filled nondistended small bowel loops noted in the lower quadrants of the abdomen. There are gas-filled mildly dilated small bowel loops also in the left lower quadrant of the abdomen. There is no pneumoperitoneum. Gastric tube is seen within nondistended contrast filled stomach. Small amount of contrast also noted along the second portion of duodenum. IMPRESSION: The pattern of oral contrast within small and large bowel suggests the presence of a partial small bowel obstruction. Electronically Signed   By: Ashley Royalty M.D.   On: 04/03/2016 21:17    Anti-infectives: Anti-infectives    None       Assessment/Plan Partial SBO - history of appendectomy, umbilical hernia repair, and "some other kind of surgery." - previous SBO 2015 - CT 2/27 showed small-bowel obstruction with transition point in the mid lower abdomen - XR 2/28 showed contrast in colon - BM yesterday,  but with persistent abdominal distension today  Plan - continue clear liquids today and encourage more ambulation. Will advance diet once he has more return in bowel function.   LOS: 2 days    Jerrye Beavers , [facility] 04/05/2016, 9:04 AM Pager: [phone] Consults: [phone] Mon-Fri 7:00 am-4:30 pm Sat-Sun 7:00 am-11:30 am

## 2016-04-06 LAB — GLUCOSE, CAPILLARY: Glucose-Capillary: 92 mg/dL (ref 65–99)

## 2016-04-06 NOTE — Progress Notes (Signed)
PROGRESS NOTE    Lonnie Schaefer  [id number] DOB: January 13, 1941 DOA: 04/02/2016 PCP: [name], FNP    Brief Narrative:  76 y.o. male with medical history significant for COPD, chronic hypoxic respiratory failure, and osteoporosis who presents to the emergency department with severe abdominal pain, nausea, and vomiting. She reports that he was in his usual state of health until 03/31/2016 when he noted the insidious development of generalized abdominal discomfort, distention, and nausea.  Pt admitted with diagnosis of SBO. General sugery consulted  Assessment & Plan:   Principal Problem:   Small bowel obstruction - Gen. surgery assisting with management -Continue supportive therapy - Pt nausea last night and required more zofran. Gen surgery planning for further observation  Active Problems:   COPD (chronic obstructive pulmonary disease) ([place]) - Compensated continue current medication regimen   Leukocytosis - Resolved with improvement in condition and off antibiotics    Hyperbilirubinemia - Resolved     AKI (acute kidney injury) ([place]) - Serum creatinine within normal limits currently. Resolved  DVT prophylaxis: Lovenox Code Status: Full Family Communication: None at bedside Disposition Plan: with improvmeen in condition most liekly d/c in the next 1-2 days   Consultants:   General surgery: Dr. Barry Dienes   Procedures: none   Antimicrobials: none   Subjective: No new complaints today.  Objective: Vitals:   04/05/16 2054 04/06/16 0606 04/06/16 0932 04/06/16 1432  BP: 112/63   126/62  Pulse: 85 73  75  Resp: 18 17  16   Temp: 98.6 F (37 C) 97.7 F (36.5 C)  98.1 F (36.7 C)  TempSrc: Oral Oral  Oral  SpO2: 93% 93% 92% 94%  Weight:      Height:        Intake/Output Summary (Last 24 hours) at 04/06/16 1516 Last data filed at 04/06/16 1432  Gross per 24 hour  Intake             3060 ml  Output              200 ml  Net             2860 ml   Filed  Weights   04/02/16 2021 04/03/16 0937  Weight: 74.4 kg (164 lb) 74.6 kg (164 lb 7.4 oz)    Examination:  General exam: Appears calm and comfortable, in nad. Respiratory system: Clear to auscultation. Respiratory effort normal. Cardiovascular system: S1 & S2 heard, RRR. No JVD, murmurs, rubs, gallops or clicks. No pedal edema. Gastrointestinal system: Abdomen is nondistended, soft and nontender. Hypoactive bowel sounds Central nervous system: Alert and oriented. No focal neurological deficits. Extremities: Symmetric 5 x 5 power. Skin: No rashes, lesions or ulcers Psychiatry: Judgement and insight appear normal. Mood & affect appropriate.     Data Reviewed: I have personally reviewed following labs and imaging studies  CBC:  Recent Labs Lab 04/02/16 2022 04/03/16 0447  WBC 16.5* 10.1  NEUTROABS  --  8.9*  HGB 15.8 14.1  HCT 46.4 42.6  MCV 93.2 93.6  PLT 268 [id number]   Basic Metabolic Panel:  Recent Labs Lab 04/02/16 2022 04/03/16 0447 04/05/16 0518  NA 137 138 139  K 4.3 4.8 3.4*  CL 103 108 107  CO2 21* 21* 25  GLUCOSE 191* 174* 94  BUN 17 19 7   CREATININE 1.27* 1.14 0.78  CALCIUM 9.5 8.3* 7.5*   GFR: Estimated Creatinine Clearance: 84.9 mL/min (by C-G formula based on SCr of 0.78 mg/dL). Liver Function Tests:  Recent Labs  Lab 04/02/16 2022 04/03/16 0447 04/05/16 0518  AST 24 18 14*  ALT 13* 13* 10*  ALKPHOS 52 41 31*  BILITOT 2.5* 2.1* 1.0  PROT 7.2 6.3* 5.3*  ALBUMIN 3.9 3.3* 2.7*    Recent Labs Lab 04/02/16 2022  LIPASE 23   No results for input(s): AMMONIA in the last 168 hours. Coagulation Profile:  Recent Labs Lab 04/03/16 0447  INR 1.03   Cardiac Enzymes: No results for input(s): CKTOTAL, CKMB, CKMBINDEX, TROPONINI in the last 168 hours. BNP (last 3 results) No results for input(s): PROBNP in the last 8760 hours. HbA1C: No results for input(s): HGBA1C in the last 72 hours. CBG:  Recent Labs Lab 04/04/16 0746 04/05/16 0752  04/06/16 0807  GLUCAP 95 106* 92   Lipid Profile: No results for input(s): CHOL, HDL, LDLCALC, TRIG, CHOLHDL, LDLDIRECT in the last 72 hours. Thyroid Function Tests: No results for input(s): TSH, T4TOTAL, FREET4, T3FREE, THYROIDAB in the last 72 hours. Anemia Panel: No results for input(s): VITAMINB12, FOLATE, FERRITIN, TIBC, IRON, RETICCTPCT in the last 72 hours. Sepsis Labs:  Recent Labs Lab 04/02/16 2158  LATICACIDVEN 1.65    No results found for this or any previous visit (from the past 240 hour(s)).    Radiology Studies: No results found. Scheduled Meds: . enoxaparin (LOVENOX) injection  40 mg Subcutaneous Q24H  . tiotropium  18 mcg Inhalation Daily   Continuous Infusions: . sodium chloride 100 mL/hr at 04/06/16 1101     LOS: 3 days   Time spent: > 35 minutes  Velvet Bathe, MD [facility] Pager [phone]  If 7PM-7AM, please contact night-coverage [email or web address] Password [email or web address] 04/06/2016, 3:16 PM

## 2016-04-06 NOTE — Progress Notes (Signed)
Patient ID: Lonnie Schaefer, male   DOB: 1941-01-14, 76 y.o.   MRN: [id number]  [facility] Surgery Progress Note     Subjective: Had BM and flatus.  Pain is gone.    Objective: Vital signs in last 24 hours: Temp:  [97.7 F (36.5 C)-98.6 F (37 C)] 97.7 F (36.5 C) (03/03 0606) Pulse Rate:  [73-85] 73 (03/03 0606) Resp:  [17-18] 17 (03/03 0606) BP: (112-129)/(63-72) 112/63 (03/02 2054) SpO2:  [93 %-95 %] 93 % (03/03 0606) Last BM Date: 04/05/16  Intake/Output from previous day: 03/02 0701 - 03/03 0700 In: 2936.7 [P.O.:1260; I.V.:1676.7] Out: 800 [Urine:800] Intake/Output this shift: No intake/output data recorded.  PE: Gen:  Alert, NAD, pleasant Pulm:  Breathing comfortably Abd: Soft, non distended Ext:  No erythema, edema, or tenderness   Lab Results:  No results for input(s): WBC, HGB, HCT, PLT in the last 72 hours. BMET  Recent Labs  04/05/16 0518  NA 139  K 3.4*  CL 107  CO2 25  GLUCOSE 94  BUN 7  CREATININE 0.78  CALCIUM 7.5*   PT/INR No results for input(s): LABPROT, INR in the last 72 hours. CMP     Component Value Date/Time   NA 139 04/05/2016 0518   K 3.4 (L) 04/05/2016 0518   CL 107 04/05/2016 0518   CO2 25 04/05/2016 0518   GLUCOSE 94 04/05/2016 0518   BUN 7 04/05/2016 0518   CREATININE 0.78 04/05/2016 0518   CALCIUM 7.5 (L) 04/05/2016 0518   PROT 5.3 (L) 04/05/2016 0518   ALBUMIN 2.7 (L) 04/05/2016 0518   AST 14 (L) 04/05/2016 0518   ALT 10 (L) 04/05/2016 0518   ALKPHOS 31 (L) 04/05/2016 0518   BILITOT 1.0 04/05/2016 0518   GFRNONAA >60 04/05/2016 0518   GFRAA >60 04/05/2016 0518   Lipase     Component Value Date/Time   LIPASE 23 04/02/2016 2022       Studies/Results: No results found.  Anti-infectives: Anti-infectives    None       Assessment/Plan Partial SBO - history of appendectomy, umbilical hernia repair, and "some other kind of surgery." - previous SBO 2015 - CT 2/27 showed small-bowel obstruction with  transition point in the mid lower abdomen - XR 2/28 showed contrast in colon Resolving.  Plan - Advance to full liquids/noodles/crackers OK.  Doing much better.  Pain resolved.     LOS: 3 days    [place] , [place] 04/06/2016, 9:31 AM

## 2016-04-07 LAB — GLUCOSE, CAPILLARY
Glucose-Capillary: 95 mg/dL (ref 65–99)
Glucose-Capillary: 104 mg/dL — ABNORMAL HIGH (ref 65–99)

## 2016-04-07 MED ORDER — POTASSIUM CHLORIDE CRYS ER 20 MEQ PO TBCR
40.0000 meq | EXTENDED_RELEASE_TABLET | Freq: Two times a day (BID) | ORAL | Status: AC
  Administered 2016-04-07 (×2): 40 meq via ORAL
  Filled 2016-04-07 (×2): qty 2

## 2016-04-07 MED ORDER — TRAMADOL HCL 50 MG PO TABS
50.0000 mg | ORAL_TABLET | Freq: Two times a day (BID) | ORAL | Status: DC | PRN
  Administered 2016-04-07 – 2016-04-10 (×5): 50 mg via ORAL
  Filled 2016-04-07 (×5): qty 1

## 2016-04-07 NOTE — Discharge Summary (Signed)
Physician Discharge Summary  Lonnie Schaefer [id number] DOB: Apr 16, 1940 DOA: 04/02/2016  PCP: [name], FNP  Admit date: 04/02/2016 Discharge date: 04/07/2016  Time spent: > 35 minutes  Recommendations for Outpatient Follow-up:  1. Monitor K levels   Discharge Diagnoses:  Principal Problem:   Small bowel obstruction Active Problems:   COPD (chronic obstructive pulmonary disease) (HCC)   Leukocytosis   SBO (small bowel obstruction)   Hyperbilirubinemia   AKI (acute kidney injury) ([place])   Discharge Condition: stable  Diet recommendation: soft diet  Filed Weights   04/02/16 2021 04/03/16 0937  Weight: 74.4 kg (164 lb) 74.6 kg (164 lb 7.4 oz)    History of present illness:  76 y.o. male with medical history significant for COPD, chronic hypoxic respiratory failure, and osteoporosis who presents to the emergency department with severe abdominal pain, nausea, and vomiting. She reports that he was in his usual state of health until 03/31/2016 when he noted the insidious development of generalized abdominal discomfort, distention, and nausea. Diagnosed with SBO  Hospital Course:  Principal Problem:   Small bowel obstruction - Gen. surgery assisted witih management. Condition improved with medical management. -avoid opiods  Active Problems:   COPD (chronic obstructive pulmonary disease) (HCC) - Compensated continue home medication regimen    Leukocytosis - Resolved with improvement in condition and off antibiotics   Hyperbilirubinemia - Resolved     AKI (acute kidney injury) ([place]) - Serum creatinine within normal limits currently. Resolved   Procedures:  None  Consultations:  General surgery  Discharge Exam: Vitals:   04/07/16 0431 04/07/16 1445  BP: 120/63 123/69  Pulse: 86 72  Resp: 18 18  Temp: 98.3 F (36.8 C) 98.3 F (36.8 C)    General: Pt in nad, alert and awake Cardiovascular: rrr, no rubs Respiratory: no increased wob, no  wheezes  Discharge Instructions   Discharge Instructions    Call MD for:  severe uncontrolled pain    Complete by:  As directed    Call MD for:  temperature >100.4    Complete by:  As directed    Diet - low sodium heart healthy    Complete by:  As directed    Increase activity slowly    Complete by:  As directed      Current Discharge Medication List    CONTINUE these medications which have NOT CHANGED   Details  alendronate (FOSAMAX) 70 MG tablet Take 70 mg by mouth every Wednesday. Refills: 0    aspirin 81 MG chewable tablet Chew 81 mg by mouth daily.    OXYGEN-HELIUM IN Inhale 2 each into the lungs See admin instructions. Inhale 2 liters at bedtime    tiotropium (SPIRIVA) 18 MCG inhalation capsule Place 18 mcg into inhaler and inhale daily.    albuterol (PROVENTIL HFA;VENTOLIN HFA) 108 (90 BASE) MCG/ACT inhaler Inhale 1 puff into the lungs every 6 (six) hours as needed for wheezing.       STOP taking these medications     polyethylene glycol (MIRALAX / GLYCOLAX) packet        No Known Allergies    The results of significant diagnostics from this hospitalization (including imaging, microbiology, ancillary and laboratory) are listed below for reference.    Significant Diagnostic Studies: Dg Abdomen 1 View  Result Date: 04/03/2016 CLINICAL DATA:  Nasogastric tube placement.  Initial encounter. EXAM: ABDOMEN - 1 VIEW COMPARISON:  CT of the abdomen and pelvis from 04/02/2016 FINDINGS: The patient's enteric tube is noted ending at  the mid esophagus, kinked at the side port and extending back superiorly. This should be removed and reinserted. There is persistent distention of small-bowel loops, reflecting small bowel obstruction. Contrast is seen within the bladder. Atelectasis or scarring is noted at the lung bases. No acute osseous abnormalities are seen. IMPRESSION: 1. Enteric tube noted ending at the mid esophagus, kinked at the side port and extending back superiorly.  This should be removed and reinserted. 2. Persistent distention of small-bowel loops, reflecting small bowel obstruction. These results were called by telephone at the time of interpretation on 04/03/2016 at 2:25 am to [facility] in the [facility] ER, who verbally acknowledged these results. Electronically Signed   By: Garald Schaefer M.D.   On: 04/03/2016 02:28   Ct Abdomen Pelvis W Contrast  Result Date: 04/02/2016 CLINICAL DATA:  Abdominal pain and vomiting.  Abdominal distention. EXAM: CT ABDOMEN AND PELVIS WITH CONTRAST TECHNIQUE: Multidetector CT imaging of the abdomen and pelvis was performed using the standard protocol following bolus administration of intravenous contrast. CONTRAST:  169mL ISOVUE-300 IOPAMIDOL (ISOVUE-300) INJECTION 61% COMPARISON:  CT 10/05/2013. FINDINGS: Lower chest: Motion limited evaluation. Streaky bibasilar atelectasis. Coronary artery calcifications Hepatobiliary: No focal hepatic lesion. Calcified gallstones within decompressed gallbladder. Pancreas: No ductal dilatation or inflammation. Spleen: Normal in size without focal abnormality. Small splenule at the hilum. Adrenals/Urinary Tract: No adrenal nodule. Symmetric renal enhancement and excretion on delayed phase imaging. 12 mm partially exophytic low-density lesion from the lower left kidney measuring slightly higher than simple fluid density. Urinary bladder is minimally distended. Stomach/Bowel: Stomach is distended and fluid-filled. Small bowel is dilated and fluid-filled. There is a gradual transition in the central lower abdomen with transition point identified image 71 series 2. Small bowel distal to this is decompressed. Fluid in the cecum and ascending colon. The remainder the colon is relatively decompressed. Multifocal colonic diverticulosis, advanced in the sigmoid colon without acute inflammation. Vascular/Lymphatic: Aortic tortuosity and atherosclerosis without aneurysm. No adenopathy. Atherosclerosis of aortic  branch vessels. Reproductive: Prostate gland is normal in size. Other: No significant mesenteric edema or free fluid. No free air. Fat within both inguinal canals. Postsurgical change of the anterior abdominal wall with minimal fat containing supraumbilical hernia. No inflammation. Musculoskeletal: There are no acute or suspicious osseous abnormalities. IMPRESSION: 1. Small-bowel obstruction with transition point in the mid lower abdomen. 2. Multifocal colonic diverticulosis, advanced in the sigmoid colon, no acute inflammation. 3. Aortic atherosclerosis without aneurysm. Electronically Signed   By: Jeb Levering M.D.   On: 04/02/2016 23:21   Dg Abd Portable 1v-small Bowel Obstruction Protocol-initial, 8 Hr Delay  Result Date: 04/03/2016 CLINICAL DATA:  Small-bowel obstruction, 8 hour delay fell EXAM: PORTABLE ABDOMEN - 1 VIEW COMPARISON:  None. FINDINGS: Enteric contrast has reached large bowel and extends to the distal descending colon. Contrast filled nondistended small bowel loops noted in the lower quadrants of the abdomen. There are gas-filled mildly dilated small bowel loops also in the left lower quadrant of the abdomen. There is no pneumoperitoneum. Gastric tube is seen within nondistended contrast filled stomach. Small amount of contrast also noted along the second portion of duodenum. IMPRESSION: The pattern of oral contrast within small and large bowel suggests the presence of a partial small bowel obstruction. Electronically Signed   By: Ashley Royalty M.D.   On: 04/03/2016 21:17   Dg Abd Portable 1v  Result Date: 04/03/2016 CLINICAL DATA:  Nasogastric tube confirmation. EXAM: PORTABLE ABDOMEN - 1 VIEW COMPARISON:  Abdominal radiograph April 03, 2016 at 0143 hours FINDINGS: Nasogastric tube tip and side-port canal project in proximal to mid stomach. Multiple loops of gas distended small bowel in the included abdomen. Bibasilar atelectasis. Soft tissue planes and included osseous structures  are unchanged. IMPRESSION: Repositioned nasogastric tube tip projects in proximal to mid stomach. Partially imaged probable small bowel obstruction. Electronically Signed   By: Elon Alas M.D.   On: 04/03/2016 04:09    Microbiology: No results found for this or any previous visit (from the past 240 hour(s)).   Labs: Basic Metabolic Panel:  Recent Labs Lab 04/02/16 2022 04/03/16 0447 04/05/16 0518  NA 137 138 139  K 4.3 4.8 3.4*  CL 103 108 107  CO2 21* 21* 25  GLUCOSE 191* 174* 94  BUN 17 19 7   CREATININE 1.27* 1.14 0.78  CALCIUM 9.5 8.3* 7.5*   Liver Function Tests:  Recent Labs Lab 04/02/16 2022 04/03/16 0447 04/05/16 0518  AST 24 18 14*  ALT 13* 13* 10*  ALKPHOS 52 41 31*  BILITOT 2.5* 2.1* 1.0  PROT 7.2 6.3* 5.3*  ALBUMIN 3.9 3.3* 2.7*    Recent Labs Lab 04/02/16 2022  LIPASE 23   No results for input(s): AMMONIA in the last 168 hours. CBC:  Recent Labs Lab 04/02/16 2022 04/03/16 0447  WBC 16.5* 10.1  NEUTROABS  --  8.9*  HGB 15.8 14.1  HCT 46.4 42.6  MCV 93.2 93.6  PLT 268 229   Cardiac Enzymes: No results for input(s): CKTOTAL, CKMB, CKMBINDEX, TROPONINI in the last 168 hours. BNP: BNP (last 3 results) No results for input(s): BNP in the last 8760 hours.  ProBNP (last 3 results) No results for input(s): PROBNP in the last 8760 hours.  CBG:  Recent Labs Lab 04/04/16 0746 04/05/16 0752 04/06/16 0807 04/07/16 0749  GLUCAP 95 106* 92 95    Signed:  Velvet Bathe MD.  [facility] 04/07/2016, 3:54 PM

## 2016-04-07 NOTE — Progress Notes (Signed)
Pt is having some shortness of breath, is using his O2.  Abd tight/feeling naus. and he is having some diarrhea at present.  Encouraged pt to ambulate and will medicate for any pain and nausea.  Called Dr. Wendee Beavers and informed, will cancel DC.

## 2016-04-07 NOTE — Progress Notes (Signed)
  Subjective: Feels fine.  No pain.  2 bowel movements.  Tolerating liquid diet.  Objective: Vital signs in last 24 hours: Temp:  [98.1 F (36.7 C)-98.3 F (36.8 C)] 98.3 F (36.8 C) (03/04 0431) Pulse Rate:  [75-86] 86 (03/04 0431) Resp:  [16-18] 18 (03/04 0431) BP: (120-126)/(62-63) 120/63 (03/04 0431) SpO2:  [92 %-97 %] 97 % (03/04 0431) Last BM Date: 04/06/16  Intake/Output from previous day: 03/03 0701 - 03/04 0700 In: 4103.3 [P.O.:1080; I.V.:3023.3] Out: -  Intake/Output this shift: No intake/output data recorded.   PE: Gen:  Alert, NAD, pleasant Pulm:  Breathing comfortably.  Clear to auscultation bilaterally Abd: Soft, protuberant but not tympanitic.  Nontender.  Patient states this is his baseline abdominal contour. Ext:  No erythema, edema, or tenderness    Lab Results:  No results for input(s): WBC, HGB, HCT, PLT in the last 72 hours. BMET  Recent Labs  04/05/16 0518  NA 139  K 3.4*  CL 107  CO2 25  GLUCOSE 94  BUN 7  CREATININE 0.78  CALCIUM 7.5*   PT/INR No results for input(s): LABPROT, INR in the last 72 hours. ABG No results for input(s): PHART, HCO3 in the last 72 hours.  Invalid input(s): PCO2, PO2  Studies/Results: No results found.  Anti-infectives: Anti-infectives    None      Assessment/Plan:  Partial SBO - history of appendectomy, umbilical hernia repair, and "some other kind of surgery." - previous SBO 2015 - CT 2/27 showed small-bowel obstruction with transition point in the mid lower abdomen - XR 2/28 showed contrast in colon Resolving.  Hypokalemia-I have ordered 2 doses of K-dur (40 Meq) with meals  Plan - advance to soft diet            Discontinue narcotics            If tolerates breakfast and lunch, suggest can be discharged home later  today.            No surgical follow-up required unless he didn't develops recurrent symptoms     LOS: 4 days    Lonnie Schaefer M 04/07/2016

## 2016-04-08 ENCOUNTER — Inpatient Hospital Stay (HOSPITAL_COMMUNITY): Payer: Medicare Other

## 2016-04-08 LAB — LACTIC ACID, PLASMA: Lactic Acid, Venous: 1.1 mmol/L (ref 0.5–1.9)

## 2016-04-08 LAB — GLUCOSE, CAPILLARY: Glucose-Capillary: 104 mg/dL — ABNORMAL HIGH (ref 65–99)

## 2016-04-08 MED ORDER — LIDOCAINE VISCOUS 2 % MT SOLN
OROMUCOSAL | Status: AC
  Administered 2016-04-08: 5 mL via OROMUCOSAL
  Filled 2016-04-08: qty 15

## 2016-04-08 MED ORDER — LIDOCAINE VISCOUS 2 % MT SOLN
15.0000 mL | Freq: Once | OROMUCOSAL | Status: AC
  Administered 2016-04-08: 5 mL via OROMUCOSAL

## 2016-04-08 MED ORDER — ACETAMINOPHEN 10 MG/ML IV SOLN
1000.0000 mg | Freq: Four times a day (QID) | INTRAVENOUS | Status: AC
  Administered 2016-04-08 – 2016-04-09 (×4): 1000 mg via INTRAVENOUS
  Filled 2016-04-08 (×4): qty 100

## 2016-04-08 NOTE — Progress Notes (Signed)
Unable to put NGT two nurses tried to put it in but notice a resistance. Md made aware.

## 2016-04-08 NOTE — Progress Notes (Signed)
[facility]  Progress Note  Subjective: Reports some nausea yesterday evening and did not want to eat dinner last night. States his abdomen "feels tight" this morning and he has had a liquid only stool. No nausea this morning and wants to try eating breakfast. Ambulatory without difficulty.   Objective: Vital signs in last 24 hours: Temp:  [97.8 F (36.6 C)-98.4 F (36.9 C)] 97.8 F (36.6 C) (03/05 0543) Pulse Rate:  [70-72] 71 (03/05 0543) Resp:  [16-18] 16 (03/05 0543) BP: (123-133)/(69-79) 129/70 (03/05 0543) SpO2:  [91 %-96 %] 95 % (03/05 0543) Last BM Date: 04/06/16  Intake/Output from previous day: 03/04 0701 - 03/05 0700 In: 2077.5 [P.O.:680; I.V.:1397.5] Out: 2 [Stool:2] Intake/Output this shift: No intake/output data recorded.  PE: General: pleasant, WD, WN white male who is seated on the side of his bed in NAD HEENT: head is normocephalic, atraumatic.  Sclera are non-icteric. Mouth is moist Heart: regular, rate, and rhythm.  Normal s1,s2. No obvious murmurs. Lungs: CTAB, no wheezes, rhonchi, or rales noted.  Respiratory effort nonlabored Abd: soft, NT, but moderately distended and typanitic. No bowel sounds during exam. MS: all 4 extremities are symmetrical with no edema. Skin: warm and dry with no rashes Psych: A&Ox3 with an appropriate affect.  Lab Results:  No results for input(s): WBC, HGB, HCT, PLT in the last 72 hours. BMET No results for input(s): NA, K, CL, CO2, GLUCOSE, BUN, CREATININE, CALCIUM in the last 72 hours. PT/INR No results for input(s): LABPROT, INR in the last 72 hours. CMP     Component Value Date/Time   NA 139 04/05/2016 0518   K 3.4 (L) 04/05/2016 0518   CL 107 04/05/2016 0518   CO2 25 04/05/2016 0518   GLUCOSE 94 04/05/2016 0518   BUN 7 04/05/2016 0518   CREATININE 0.78 04/05/2016 0518   CALCIUM 7.5 (L) 04/05/2016 0518   PROT 5.3 (L) 04/05/2016 0518   ALBUMIN 2.7 (L) 04/05/2016 0518   AST 14 (L) 04/05/2016 0518   ALT 10 (L) 04/05/2016 0518   ALKPHOS 31 (L) 04/05/2016 0518   BILITOT 1.0 04/05/2016 0518   GFRNONAA >60 04/05/2016 0518   GFRAA >60 04/05/2016 0518   Lipase     Component Value Date/Time   LIPASE 23 04/02/2016 2022       Studies/Results: No results found.  Anti-infectives: Anti-infectives    None       Assessment/Plan  HD #5 with partial SBO.  - history of appendectomy, umbilical hernia repair, and "some other kind of surgery." - previous SBO 2015. - CT 2/27 showed small-bowel obstruction with transition point in the mid lower abdomen - XR 2/28 showed contrast in colon Clinically, patient's history and exam over past 12-24 hours suggests persistent pSBO. Will order abdominal radiographs to assess/compare to those from 04/03/2016 and discuss with Dr. Juanna Cao MD.    LOS: 5 days    Dot Been [facility] 04/08/2016, 8:53 AM

## 2016-04-08 NOTE — Care Management Important Message (Signed)
Important Message  Patient Details  Name: Lonnie Schaefer MRN: [id number] Date of Birth: [birth year]   Medicare Important Message Given:  Yes    Norah Devin 04/08/2016, 1:40 PM

## 2016-04-08 NOTE — Progress Notes (Signed)
PROGRESS NOTE    Lonnie Schaefer  [id number] DOB: December 11, 1940 DOA: 04/02/2016 PCP: [name], FNP    Brief Narrative:  76 y.o. male with medical history significant for COPD, chronic hypoxic respiratory failure, and osteoporosis who presents to the emergency department with severe abdominal pain, nausea, and vomiting. She reports that he was in his usual state of health until 03/31/2016 when he noted the insidious development of generalized abdominal discomfort, distention, and nausea.  Pt admitted with diagnosis of SBO. General sugery consulted  Assessment & Plan:   Principal Problem:   Small bowel obstruction - Gen. surgery assisting with management - Pt nausea last night and required more zofran. Pt may require reexploration as he is still distended with signs of continued sbo - obtain lactic acid levels  Active Problems:   COPD (chronic obstructive pulmonary disease) (HCC) - Compensated continue current medication regimen   Leukocytosis - Resolved with improvement in condition and off antibiotics    Hyperbilirubinemia - Resolved     AKI (acute kidney injury) ([place]) - Serum creatinine within normal limits currently. Resolved  DVT prophylaxis: Lovenox Code Status: Full Family Communication: None at bedside Disposition Plan: Worsening in condition requiring further monitoring.   Consultants:   General surgery: Dr. Barry Dienes   Procedures: none   Antimicrobials: none   Subjective: Still distended requiring oxygen use  Objective: Vitals:   04/07/16 0431 04/07/16 1445 04/07/16 2009 04/08/16 0543  BP: 120/63 123/69 133/79 129/70  Pulse: 86 72 70 71  Resp: 18 18 16 16   Temp: 98.3 F (36.8 C) 98.3 F (36.8 C) 98.4 F (36.9 C) 97.8 F (36.6 C)  TempSrc: Oral Oral Oral Oral  SpO2: 97% 91% 96% 95%  Weight:      Height:        Intake/Output Summary (Last 24 hours) at 04/08/16 1652 Last data filed at 04/08/16 0544  Gross per 24 hour  Intake            1757.5 ml  Output                2 ml  Net           1755.5 ml   Filed Weights   04/02/16 2021 04/03/16 0937  Weight: 74.4 kg (164 lb) 74.6 kg (164 lb 7.4 oz)    Examination:  General exam: Awake and alert, in nad. Respiratory system: Clear to auscultation. Respiratory effort normal. Cardiovascular system: S1 & S2 heard, RRR. No JVD, murmurs, rubs, gallops or clicks. No pedal edema. Gastrointestinal system: Abdomen is Distended, soft and nontender. Hypoactive bowel sounds Central nervous system: Alert and oriented. No focal neurological deficits. Extremities: Symmetric 5 x 5 power. Skin: No rashes, lesions or ulcers Psychiatry: Judgement and insight appear normal. Mood & affect appropriate.     Data Reviewed: I have personally reviewed following labs and imaging studies  CBC:  Recent Labs Lab 04/02/16 2022 04/03/16 0447  WBC 16.5* 10.1  NEUTROABS  --  8.9*  HGB 15.8 14.1  HCT 46.4 42.6  MCV 93.2 93.6  PLT 268 [id number]   Basic Metabolic Panel:  Recent Labs Lab 04/02/16 2022 04/03/16 0447 04/05/16 0518  NA 137 138 139  K 4.3 4.8 3.4*  CL 103 108 107  CO2 21* 21* 25  GLUCOSE 191* 174* 94  BUN 17 19 7   CREATININE 1.27* 1.14 0.78  CALCIUM 9.5 8.3* 7.5*   GFR: Estimated Creatinine Clearance: 84.9 mL/min (by C-G formula based on SCr of 0.78 mg/dL). Liver  Function Tests:  Recent Labs Lab 04/02/16 2022 04/03/16 0447 04/05/16 0518  AST 24 18 14*  ALT 13* 13* 10*  ALKPHOS 52 41 31*  BILITOT 2.5* 2.1* 1.0  PROT 7.2 6.3* 5.3*  ALBUMIN 3.9 3.3* 2.7*    Recent Labs Lab 04/02/16 2022  LIPASE 23   No results for input(s): AMMONIA in the last 168 hours. Coagulation Profile:  Recent Labs Lab 04/03/16 0447  INR 1.03   Cardiac Enzymes: No results for input(s): CKTOTAL, CKMB, CKMBINDEX, TROPONINI in the last 168 hours. BNP (last 3 results) No results for input(s): PROBNP in the last 8760 hours. HbA1C: No results for input(s): HGBA1C in the last 72  hours. CBG:  Recent Labs Lab 04/05/16 0752 04/06/16 0807 04/07/16 0749 04/07/16 2103 04/08/16 0737  GLUCAP 106* 92 95 104* 104*   Lipid Profile: No results for input(s): CHOL, HDL, LDLCALC, TRIG, CHOLHDL, LDLDIRECT in the last 72 hours. Thyroid Function Tests: No results for input(s): TSH, T4TOTAL, FREET4, T3FREE, THYROIDAB in the last 72 hours. Anemia Panel: No results for input(s): VITAMINB12, FOLATE, FERRITIN, TIBC, IRON, RETICCTPCT in the last 72 hours. Sepsis Labs:  Recent Labs Lab 04/02/16 2158  LATICACIDVEN 1.65    No results found for this or any previous visit (from the past 240 hour(s)).    Radiology Studies: Dg Abd 1 View  Result Date: 04/08/2016 CLINICAL DATA:  Nasogastric tube placement EXAM: ABDOMEN - 1 VIEW COMPARISON:  04/08/2016 FINDINGS: NG tube with tip in the gastric antrum. Side port below the GE junction. Dilated loops of small bowel. IMPRESSION: NG tube in stomach. Electronically Signed   By: Suzy Bouchard M.D.   On: 04/08/2016 15:18   Dg Abd 2 Views  Result Date: 04/08/2016 CLINICAL DATA:  Small bowel obstruction EXAM: ABDOMEN - 2 VIEW COMPARISON:  04/03/2016 FINDINGS: Upright film shows no evidence for intraperitoneal free air. Gaseous small bowel dilatation is evident with some small bowel loops in the central abdomen measuring up to 5.5 cm diameter. Small volume of gas seen scattered along the length of the colon. IMPRESSION: Prominent gaseous dilation of central small bowel loops measuring up to 5.5 cm. Imaging features suggest small bowel obstruction. Electronically Signed   By: Misty Stanley M.D.   On: 04/08/2016 11:11   Scheduled Meds: . enoxaparin (LOVENOX) injection  40 mg Subcutaneous Q24H  . tiotropium  18 mcg Inhalation Daily   Continuous Infusions: . sodium chloride 100 mL/hr at 04/08/16 1220     LOS: 5 days   Time spent: > 35 minutes  Velvet Bathe, MD [facility] Pager [phone]  If 7PM-7AM, please contact  night-coverage [email or web address] Password [email or web address] 04/08/2016, 4:52 PM

## 2016-04-09 LAB — BASIC METABOLIC PANEL
Anion gap: 6 (ref 5–15)
BUN: <5 mg/dL — ABNORMAL LOW (ref 6–20)
CO2: 27 mmol/L (ref 22–32)
Calcium: 8.2 mg/dL — ABNORMAL LOW (ref 8.9–10.3)
Chloride: 106 mmol/L (ref 101–111)
Creatinine, Ser: 0.88 mg/dL (ref 0.61–1.24)
GFR calc Af Amer: >60 mL/min (ref >60)
GFR calc non Af Amer: >60 mL/min (ref >60)
Glucose, Bld: 100 mg/dL — ABNORMAL HIGH (ref 65–99)
Potassium: 4 mmol/L (ref 3.5–5.1)
Sodium: 139 mmol/L (ref 135–145)

## 2016-04-09 LAB — LACTIC ACID, PLASMA
Lactic Acid, Venous: 0.7 mmol/L (ref 0.5–1.9)
Lactic Acid, Venous: 0.7 mmol/L (ref 0.5–1.9)

## 2016-04-09 LAB — GLUCOSE, CAPILLARY: Glucose-Capillary: 97 mg/dL (ref 65–99)

## 2016-04-09 NOTE — Progress Notes (Signed)
[facility] Progress Note     Subjective: Pt had a watery BM today with lots of flatus. Still endorsing mild nausea with ambulation. Mild abdominal pain but improved since admission. No vomiting. 0 NGT output recorded in last 24hrs.   Objective: Vital signs in last 24 hours: Temp:  [98 F (36.7 C)-98.2 F (36.8 C)] 98 F (36.7 C) (03/06 0515) Pulse Rate:  [77-82] 77 (03/06 0515) Resp:  [16-17] 16 (03/06 0515) BP: (134)/(67-68) 134/67 (03/06 0515) SpO2:  [95 %-96 %] 96 % (03/06 0515) Last BM Date: 04/07/16  Intake/Output from previous day: 03/05 0701 - 03/06 0700 In: 2233.3 [I.V.:2033.3; IV Piggyback:200] Out: 0  Intake/Output this shift: No intake/output data recorded.  PE: Gen:  Alert, NAD, pleasant, cooperative, well appearing, lying in bed Card:  RRR, no M/G/R heard Pulm:  Rate and effort normal Abd: Soft, distended, tympanic, +BS, mild generalized TTP Skin: no rashes noted, warm and dry  Lab Results:  No results for input(s): WBC, HGB, HCT, PLT in the last 72 hours. BMET  Recent Labs  04/08/16 2328  NA 139  K 4.0  CL 106  CO2 27  GLUCOSE 100*  BUN <5*  CREATININE 0.88  CALCIUM 8.2*   PT/INR No results for input(s): LABPROT, INR in the last 72 hours. CMP     Component Value Date/Time   NA 139 04/08/2016 2328   K 4.0 04/08/2016 2328   CL 106 04/08/2016 2328   CO2 27 04/08/2016 2328   GLUCOSE 100 (H) 04/08/2016 2328   BUN <5 (L) 04/08/2016 2328   CREATININE 0.88 04/08/2016 2328   CALCIUM 8.2 (L) 04/08/2016 2328   PROT 5.3 (L) 04/05/2016 0518   ALBUMIN 2.7 (L) 04/05/2016 0518   AST 14 (L) 04/05/2016 0518   ALT 10 (L) 04/05/2016 0518   ALKPHOS 31 (L) 04/05/2016 0518   BILITOT 1.0 04/05/2016 0518   GFRNONAA >60 04/08/2016 2328   GFRAA >60 04/08/2016 2328   Lipase     Component Value Date/Time   LIPASE 23 04/02/2016 2022       Studies/Results: Dg Abd 1 View  Result Date: 04/08/2016 CLINICAL DATA:  Nasogastric tube placement  EXAM: ABDOMEN - 1 VIEW COMPARISON:  04/08/2016 FINDINGS: NG tube with tip in the gastric antrum. Side port below the GE junction. Dilated loops of small bowel. IMPRESSION: NG tube in stomach. Electronically Signed   By: Suzy Bouchard M.D.   On: 04/08/2016 15:18   Dg Abd 2 Views  Result Date: 04/08/2016 CLINICAL DATA:  Small bowel obstruction EXAM: ABDOMEN - 2 VIEW COMPARISON:  04/03/2016 FINDINGS: Upright film shows no evidence for intraperitoneal free air. Gaseous small bowel dilatation is evident with some small bowel loops in the central abdomen measuring up to 5.5 cm diameter. Small volume of gas seen scattered along the length of the colon. IMPRESSION: Prominent gaseous dilation of central small bowel loops measuring up to 5.5 cm. Imaging features suggest small bowel obstruction. Electronically Signed   By: Misty Stanley M.D.   On: 04/08/2016 11:11    Anti-infectives: Anti-infectives    None       Assessment/Plan Partial SBO.  - history of appendectomy, umbilical hernia repair, and "some other kind of surgery." - previous SBO 2015. - CT 2/27 showed small-bowel obstruction with transition point in the mid lower abdomen - XR 2/28 showed contrast in colon - clinically his SBO seems to be resolving  Pt had a BM and is having flatus. No NGT output in  24 hours recorded. Will clamp tube and advance to clears. Will not remove tube at this time. If pt tolerated clears then will remove tube tomorrow.    LOS: 6 days    Kalman Drape , [facility] 04/09/2016, 9:46 AM Pager: [phone] Consults: [phone] Mon-Fri 7:00 am-4:30 pm Sat-Sun 7:00 am-11:30 am

## 2016-04-09 NOTE — Care Management Note (Signed)
Case Management Note  Patient Details  Name: BURLIE ROSCHER MRN: [id number] Date of Birth: 12/13/1940  Subjective/Objective:                    Action/Plan:   Expected Discharge Date:  04/07/16               Expected Discharge Plan:  Home/Self Care  In-House Referral:     Discharge planning Services  CM Consult  Post Acute Care Choice:    Choice offered to:     DME Arranged:    DME Agency:     HH Arranged:    HH Agency:     Status of Service:  In process, will continue to follow  If discussed at Long Length of Stay Meetings, dates discussed:    Additional Comments:  Marilu Favre, RN 04/09/2016, 11:34 AM

## 2016-04-09 NOTE — Progress Notes (Signed)
PROGRESS NOTE    Lonnie Schaefer  [id number] DOB: 06/06/1940 DOA: 04/02/2016 PCP: [name], FNP    Brief Narrative:  76 y.o. male with medical history significant for COPD, chronic hypoxic respiratory failure, and osteoporosis who presents to the emergency department with severe abdominal pain, nausea, and vomiting. She reports that he was in his usual state of health until 03/31/2016 when he noted the insidious development of generalized abdominal discomfort, distention, and nausea.  Pt admitted with diagnosis of SBO. General sugery consulted  Assessment & Plan:   Principal Problem:   Small bowel obstruction - Gen. surgery assisting with management - Pt may require reexploration as he is still distended with signs of continued sbo. But general surgery monitoring for now. - repeat film in AM.  Active Problems:   COPD (chronic obstructive pulmonary disease) (HCC) - Compensated continue current medication regimen   Leukocytosis - Resolved with improvement in condition and off antibiotics    Hyperbilirubinemia - Resolved     AKI (acute kidney injury) ([place]) - Serum creatinine within normal limits currently. Resolved  DVT prophylaxis: Lovenox Code Status: Full Family Communication: None at bedside Disposition Plan: Worsening in condition requiring further monitoring.   Consultants:   General surgery: Dr. Barry Dienes   Procedures: none   Antimicrobials: none  Subjective: Pt has no new complaints. No acute issues overnight.  Objective: Vitals:   04/08/16 0543 04/08/16 2205 04/09/16 0515 04/09/16 1357  BP: 129/70 134/68 134/67 (!) 145/78  Pulse: 71 82 77 100  Resp: 16 17 16 18   Temp: 97.8 F (36.6 C) 98.2 F (36.8 C) 98 F (36.7 C) 97.9 F (36.6 C)  TempSrc: Oral Oral Oral Oral  SpO2: 95% 95% 96% 90%  Weight:      Height:        Intake/Output Summary (Last 24 hours) at 04/09/16 1630 Last data filed at 04/09/16 0654  Gross per 24 hour  Intake              1700 ml  Output                0 ml  Net             1700 ml   Filed Weights   04/02/16 2021 04/03/16 0937  Weight: 74.4 kg (164 lb) 74.6 kg (164 lb 7.4 oz)    Examination:  General exam: Awake and alert, in nad. Respiratory system: Clear to auscultation. Respiratory effort normal. Cardiovascular system: S1 & S2 heard, RRR. No JVD, murmurs, rubs, gallops or clicks. No pedal edema. Gastrointestinal system: Abdomen is Distended, soft and nontender. Hypoactive bowel sounds Central nervous system: Alert and oriented. No focal neurological deficits. Extremities: Symmetric 5 x 5 power. Skin: No rashes, lesions or ulcers Psychiatry: Judgement and insight appear normal. Mood & affect appropriate.   Data Reviewed: I have personally reviewed following labs and imaging studies  CBC:  Recent Labs Lab 04/02/16 2022 04/03/16 0447  WBC 16.5* 10.1  NEUTROABS  --  8.9*  HGB 15.8 14.1  HCT 46.4 42.6  MCV 93.2 93.6  PLT 268 [id number]   Basic Metabolic Panel:  Recent Labs Lab 04/02/16 2022 04/03/16 0447 04/05/16 0518 04/08/16 2328  NA 137 138 139 139  K 4.3 4.8 3.4* 4.0  CL 103 108 107 106  CO2 21* 21* 25 27  GLUCOSE 191* 174* 94 100*  BUN 17 19 7  <5*  CREATININE 1.27* 1.14 0.78 0.88  CALCIUM 9.5 8.3* 7.5* 8.2*   GFR: Estimated  Creatinine Clearance: 77.2 mL/min (by C-G formula based on SCr of 0.88 mg/dL). Liver Function Tests:  Recent Labs Lab 04/02/16 2022 04/03/16 0447 04/05/16 0518  AST 24 18 14*  ALT 13* 13* 10*  ALKPHOS 52 41 31*  BILITOT 2.5* 2.1* 1.0  PROT 7.2 6.3* 5.3*  ALBUMIN 3.9 3.3* 2.7*    Recent Labs Lab 04/02/16 2022  LIPASE 23   No results for input(s): AMMONIA in the last 168 hours. Coagulation Profile:  Recent Labs Lab 04/03/16 0447  INR 1.03   Cardiac Enzymes: No results for input(s): CKTOTAL, CKMB, CKMBINDEX, TROPONINI in the last 168 hours. BNP (last 3 results) No results for input(s): PROBNP in the last 8760 hours. HbA1C: No  results for input(s): HGBA1C in the last 72 hours. CBG:  Recent Labs Lab 04/06/16 0807 04/07/16 0749 04/07/16 2103 04/08/16 0737 04/09/16 0818  GLUCAP 92 95 104* 104* 97   Lipid Profile: No results for input(s): CHOL, HDL, LDLCALC, TRIG, CHOLHDL, LDLDIRECT in the last 72 hours. Thyroid Function Tests: No results for input(s): TSH, T4TOTAL, FREET4, T3FREE, THYROIDAB in the last 72 hours. Anemia Panel: No results for input(s): VITAMINB12, FOLATE, FERRITIN, TIBC, IRON, RETICCTPCT in the last 72 hours. Sepsis Labs:  Recent Labs Lab 04/02/16 2158 04/08/16 1727 04/09/16 0754  LATICACIDVEN 1.65 1.1 0.7    No results found for this or any previous visit (from the past 240 hour(s)).    Radiology Studies: Dg Abd 1 View  Result Date: 04/08/2016 CLINICAL DATA:  Nasogastric tube placement EXAM: ABDOMEN - 1 VIEW COMPARISON:  04/08/2016 FINDINGS: NG tube with tip in the gastric antrum. Side port below the GE junction. Dilated loops of small bowel. IMPRESSION: NG tube in stomach. Electronically Signed   By: Suzy Bouchard M.D.   On: 04/08/2016 15:18   Dg Abd 2 Views  Result Date: 04/08/2016 CLINICAL DATA:  Small bowel obstruction EXAM: ABDOMEN - 2 VIEW COMPARISON:  04/03/2016 FINDINGS: Upright film shows no evidence for intraperitoneal free air. Gaseous small bowel dilatation is evident with some small bowel loops in the central abdomen measuring up to 5.5 cm diameter. Small volume of gas seen scattered along the length of the colon. IMPRESSION: Prominent gaseous dilation of central small bowel loops measuring up to 5.5 cm. Imaging features suggest small bowel obstruction. Electronically Signed   By: Misty Stanley M.D.   On: 04/08/2016 11:11   Scheduled Meds: . enoxaparin (LOVENOX) injection  40 mg Subcutaneous Q24H  . tiotropium  18 mcg Inhalation Daily   Continuous Infusions: . sodium chloride 100 mL/hr at 04/09/16 1455     LOS: 6 days   Time spent: > 35 minutes  Velvet Bathe,  MD [facility] Pager [phone]  If 7PM-7AM, please contact night-coverage [email or web address] Password [email or web address] 04/09/2016, 4:30 PM

## 2016-04-10 DIAGNOSIS — J449 Chronic obstructive pulmonary disease, unspecified: Secondary | ICD-10-CM

## 2016-04-10 DIAGNOSIS — K56609 Unspecified intestinal obstruction, unspecified as to partial versus complete obstruction: Secondary | ICD-10-CM

## 2016-04-10 LAB — GLUCOSE, CAPILLARY
Glucose-Capillary: 64 mg/dL — ABNORMAL LOW (ref 65–99)
Glucose-Capillary: 84 mg/dL (ref 65–99)

## 2016-04-10 LAB — CREATININE, SERUM
Creatinine, Ser: 0.83 mg/dL (ref 0.61–1.24)
GFR calc Af Amer: >60 mL/min (ref >60)
GFR calc non Af Amer: >60 mL/min (ref >60)

## 2016-04-10 LAB — LACTIC ACID, PLASMA: Lactic Acid, Venous: 1.1 mmol/L (ref 0.5–1.9)

## 2016-04-10 NOTE — Progress Notes (Signed)
[facility] PROGRESS NOTE  Lonnie Schaefer [id number] DOB: 14-Jun-1940 DOA: 04/02/2016  PCP: [name], FNP  Brief History/Interval Summary: 76 year old male with a past medical history of COPD, chronic hypoxic respiratory failure, and osteoporosis, presented with nausea, vomiting, abdominal pain. Patient found to have small bowel obstruction. Patient was hospitalized for further management.  Reason for Visit: Small bowel obstruction  Consultants: Gen. surgery  Procedures: None  Antibiotics: None  Subjective/Interval History: Patient feels better this morning. Denies any nausea, vomiting. No abdominal pain. Tolerating his clear liquids.  ROS: Denies any chest pain or shortness of breath.  Objective:  Vital Signs  Vitals:   04/09/16 0515 04/09/16 1357 04/09/16 2245 04/10/16 0457  BP: 134/67 (!) 145/78 132/66 122/64  Pulse: 77 100 79 84  Resp: 16 18 17 16   Temp: 98 F (36.7 C) 97.9 F (36.6 C) 98.3 F (36.8 C) 98.3 F (36.8 C)  TempSrc: Oral Oral Oral Oral  SpO2: 96% 90%  93%  Weight:      Height:        Intake/Output Summary (Last 24 hours) at 04/10/16 1335 Last data filed at 04/10/16 0446  Gross per 24 hour  Intake          2436.67 ml  Output                0 ml  Net          2436.67 ml   Filed Weights   04/02/16 2021 04/03/16 0937  Weight: 74.4 kg (164 lb) 74.6 kg (164 lb 7.4 oz)    General appearance: alert, cooperative, appears stated age and no distress Resp: clear to auscultation bilaterally Cardio: regular rate and rhythm, S1, S2 normal, no murmur, click, rub or gallop GI: Abdomen feels slightly tight to palpate. Nontender. Bowel sounds present. No masses or organomegaly. Extremities: extremities normal, atraumatic, no cyanosis or edema Neurologic: Awake and alert. Oriented 3. No focal neurological deficits.  Lab Results:  Data Reviewed: I have personally reviewed following labs and imaging studies  CBC: No results for input(s):  WBC, NEUTROABS, HGB, HCT, MCV, PLT in the last 168 hours.  Basic Metabolic Panel:  Recent Labs Lab 04/05/16 0518 04/08/16 2328 04/10/16 0520  NA 139 139  --   K 3.4* 4.0  --   CL 107 106  --   CO2 25 27  --   GLUCOSE 94 100*  --   BUN 7 <5*  --   CREATININE 0.78 0.88 0.83  CALCIUM 7.5* 8.2*  --     GFR: Estimated Creatinine Clearance: 81.8 mL/min (by C-G formula based on SCr of 0.83 mg/dL).  Liver Function Tests:  Recent Labs Lab 04/05/16 0518  AST 14*  ALT 10*  ALKPHOS 31*  BILITOT 1.0  PROT 5.3*  ALBUMIN 2.7*    CBG:  Recent Labs Lab 04/07/16 2103 04/08/16 0737 04/09/16 0818 04/10/16 0810 04/10/16 0847  GLUCAP 104* 104* 97 64* 84     Radiology Studies: Dg Abd 1 View  Result Date: 04/08/2016 CLINICAL DATA:  Nasogastric tube placement EXAM: ABDOMEN - 1 VIEW COMPARISON:  04/08/2016 FINDINGS: NG tube with tip in the gastric antrum. Side port below the GE junction. Dilated loops of small bowel. IMPRESSION: NG tube in stomach. Electronically Signed   By: Suzy Bouchard M.D.   On: 04/08/2016 15:18     Medications:  Scheduled: . enoxaparin (LOVENOX) injection  40 mg Subcutaneous Q24H  . tiotropium  18 mcg Inhalation Daily   Continuous:  JME:[id number], ondansetron **OR** ondansetron (ZOFRAN) IV, phenol, traMADol  Assessment/Plan:  Principal Problem:   Small bowel obstruction Active Problems:   COPD (chronic obstructive pulmonary disease) (HCC)   Leukocytosis   SBO (small bowel obstruction)   Hyperbilirubinemia   AKI (acute kidney injury) ([place])    Small bowel obstruction NG tube was placed. Patient appears to be improving clinically. General surgery is following. Patient is tolerating clear liquid diet. He's had multiple bowel movements.  History of COPD Appears to be well compensated. Continue current medication regimen.  Leukocytosis Resolved. Repeat labs today.  Acute kidney injury Patient did have an elevated creatinine, most likely  due to hypovolemia from nausea and vomiting. Improved with hydration. Repeat labs today.  DVT Prophylaxis: Lovenox    Code Status: Full code  Family Communication: Discussed with the patient  Disposition Plan: Continue to mobilize. Management per general surgery.    LOS: 7 days   [facility] Pager [phone] 04/10/2016, 1:35 PM  If 7PM-7AM, please contact night-coverage at [email or web address], password [facility]

## 2016-04-10 NOTE — Progress Notes (Signed)
[facility] Progress Note     Subjective: Lonnie Schaefer having diarrhea and lots of gas. Tolerated NGT clamping with clears. No acute events overnight.    Objective: Vital signs in last 24 hours: Temp:  [97.9 F (36.6 C)-98.3 F (36.8 C)] 98.3 F (36.8 C) (03/07 0457) Pulse Rate:  [79-100] 84 (03/07 0457) Resp:  [16-18] 16 (03/07 0457) BP: (122-145)/(64-78) 122/64 (03/07 0457) SpO2:  [90 %-93 %] 93 % (03/07 0457) Last BM Date: 04/09/16  Intake/Output from previous day: 03/06 0701 - 03/07 0700 In: 2436.7 [P.O.:60; I.V.:2376.7] Out: -  Intake/Output this shift: No intake/output data recorded.  PE: Gen:  Alert, NAD, pleasant, cooperative, well appearing, lying in bed Card:  RRR, no M/G/R heard Pulm:  Rate and effort normal Abd: Soft, less distended, good BS, no TTP Skin: no rashes noted, warm and dry  Lab Results:  No results for input(s): WBC, HGB, HCT, PLT in the last 72 hours. BMET  Recent Labs  04/08/16 2328 04/10/16 0520  NA 139  --   K 4.0  --   CL 106  --   CO2 27  --   GLUCOSE 100*  --   BUN <5*  --   CREATININE 0.88 0.83  CALCIUM 8.2*  --    Lonnie Schaefer/INR No results for input(s): LABPROT, INR in the last 72 hours. CMP     Component Value Date/Time   NA 139 04/08/2016 2328   K 4.0 04/08/2016 2328   CL 106 04/08/2016 2328   CO2 27 04/08/2016 2328   GLUCOSE 100 (H) 04/08/2016 2328   BUN <5 (L) 04/08/2016 2328   CREATININE 0.83 04/10/2016 0520   CALCIUM 8.2 (L) 04/08/2016 2328   PROT 5.3 (L) 04/05/2016 0518   ALBUMIN 2.7 (L) 04/05/2016 0518   AST 14 (L) 04/05/2016 0518   ALT 10 (L) 04/05/2016 0518   ALKPHOS 31 (L) 04/05/2016 0518   BILITOT 1.0 04/05/2016 0518   GFRNONAA >60 04/10/2016 0520   GFRAA >60 04/10/2016 0520   Lipase     Component Value Date/Time   LIPASE 23 04/02/2016 2022       Studies/Results: Dg Abd 1 View  Result Date: 04/08/2016 CLINICAL DATA:  Nasogastric tube placement EXAM: ABDOMEN - 1 VIEW COMPARISON:  04/08/2016  FINDINGS: NG tube with tip in the gastric antrum. Side port below the GE junction. Dilated loops of small bowel. IMPRESSION: NG tube in stomach. Electronically Signed   By: Suzy Bouchard M.D.   On: 04/08/2016 15:18   Dg Abd 2 Views  Result Date: 04/08/2016 CLINICAL DATA:  Small bowel obstruction EXAM: ABDOMEN - 2 VIEW COMPARISON:  04/03/2016 FINDINGS: Upright film shows no evidence for intraperitoneal free air. Gaseous small bowel dilatation is evident with some small bowel loops in the central abdomen measuring up to 5.5 cm diameter. Small volume of gas seen scattered along the length of the colon. IMPRESSION: Prominent gaseous dilation of central small bowel loops measuring up to 5.5 cm. Imaging features suggest small bowel obstruction. Electronically Signed   By: Misty Stanley M.D.   On: 04/08/2016 11:11    Anti-infectives: Anti-infectives    None       Assessment/Plan Partial SBO.  - history of appendectomy, umbilical hernia repair, and "some other kind of surgery." - previous SBO 2015. - CT 2/27 showed small-bowel obstruction with transition point in the mid lower abdomen - XR 2/28 showed contrast in colon - clinically his SBO seems to be resolving  Lonnie Schaefer is having watery  BM's and flatus. Lonnie Schaefer tolerated clamping trial and clears. Will pull tube today and advance to fulls. If Lonnie Schaefer tolerates fulls then can advance to soft diet tonight.     LOS: 7 days    Kalman Drape , [facility] 04/10/2016, 8:31 AM Pager: [phone] Consults: [phone] Mon-Fri 7:00 am-4:30 pm Sat-Sun 7:00 am-11:30 am

## 2016-04-11 LAB — CBC
HCT: 37 % — ABNORMAL LOW (ref 39.0–52.0)
Hemoglobin: 12.4 g/dL — ABNORMAL LOW (ref 13.0–17.0)
MCH: 30.5 pg (ref 26.0–34.0)
MCHC: 33.5 g/dL (ref 30.0–36.0)
MCV: 91.1 fL (ref 78.0–100.0)
Platelets: 230 10*3/uL (ref 150–400)
RBC: 4.06 MIL/uL — ABNORMAL LOW (ref 4.22–5.81)
RDW: 13.3 % (ref 11.5–15.5)
WBC: 8 10*3/uL (ref 4.0–10.5)

## 2016-04-11 LAB — BASIC METABOLIC PANEL
Anion gap: 7 (ref 5–15)
BUN: <5 mg/dL — ABNORMAL LOW (ref 6–20)
CO2: 25 mmol/L (ref 22–32)
Calcium: 8 mg/dL — ABNORMAL LOW (ref 8.9–10.3)
Chloride: 106 mmol/L (ref 101–111)
Creatinine, Ser: 0.76 mg/dL (ref 0.61–1.24)
GFR calc Af Amer: >60 mL/min (ref >60)
GFR calc non Af Amer: >60 mL/min (ref >60)
Glucose, Bld: 91 mg/dL (ref 65–99)
Potassium: 3.2 mmol/L — ABNORMAL LOW (ref 3.5–5.1)
Sodium: 138 mmol/L (ref 135–145)

## 2016-04-11 LAB — GLUCOSE, CAPILLARY: Glucose-Capillary: 106 mg/dL — ABNORMAL HIGH (ref 65–99)

## 2016-04-11 MED ORDER — MUPIROCIN CALCIUM 2 % EX CREA
TOPICAL_CREAM | Freq: Two times a day (BID) | CUTANEOUS | Status: DC
Start: 2016-04-11 — End: 2016-04-11
  Administered 2016-04-11: 12:00:00 via TOPICAL
  Filled 2016-04-11: qty 15

## 2016-04-11 MED ORDER — MUPIROCIN CALCIUM 2 % EX CREA: TOPICAL_CREAM | Freq: Two times a day (BID) | CUTANEOUS | 0 refills | Status: DC

## 2016-04-11 MED ORDER — TRAMADOL HCL 50 MG PO TABS: 50.0000 mg | ORAL_TABLET | Freq: Two times a day (BID) | ORAL | 0 refills | Status: DC | PRN

## 2016-04-11 MED ORDER — POTASSIUM CHLORIDE CRYS ER 20 MEQ PO TBCR
40.0000 meq | EXTENDED_RELEASE_TABLET | Freq: Once | ORAL | Status: AC
  Administered 2016-04-11: 40 meq via ORAL
  Filled 2016-04-11: qty 2

## 2016-04-11 NOTE — Progress Notes (Signed)
Discharge instructions printed and reviewed with patient, and copy given for them to take home. All questions addressed at this time. New prescriptions reviewed, and paper copies given to patient to fill at his usual outpatient pharmacy. IV removed. Room searched for patient belongings, and confirmed with patient that all valuables were accounted for prior to staff escorting patient to discharge via wheelchair.

## 2016-04-11 NOTE — Discharge Instructions (Signed)
Small Bowel Obstruction °A small bowel obstruction means that something is blocking the small bowel. The small bowel is also called the small intestine. It is the long tube that connects the stomach to the colon. An obstruction will stop food and fluids from passing through the small bowel. Treatment depends on what is causing the problem and how bad the problem is. °Follow these instructions at home: °· Get a lot of rest. °· Follow your diet as told by your doctor. You may need to: °¨ Only drink clear liquids until you start to get better. °¨ Avoid solid foods as told by your doctor. °· Take over-the-counter and prescription medicines only as told by your doctor. °· Keep all follow-up visits as told by your doctor. This is important. °Contact a doctor if: °· You have a fever. °· You have chills. °Get help right away if: °· You have pain or cramps that get worse. °· You throw up (vomit) blood. °· You have a feeling of being sick to your stomach (nausea) that does not go away. °· You cannot stop throwing up. °· You cannot drink fluids. °· You feel confused. °· You feel dry or thirsty (dehydrated). °· Your belly gets more bloated. °· You feel weak or you pass out (faint). °This information is not intended to replace advice given to you by your health care provider. Make sure you discuss any questions you have with your health care provider. °Document Released: 02/29/2004 Document Revised: 09/18/2015 Document Reviewed: 03/17/2014 °Elsevier Interactive Patient Education © 2017 [employer] ° °

## 2016-04-11 NOTE — Progress Notes (Signed)
[facility]    Progress Note  Subjective: Feeling well and would like to go home. Still with watery stools and +flatus. Tolerating diet and ambulatory in halls. Reports no issues over night. States intermittent nausea has resolved.  Objective: Vital signs in last 24 hours: Temp:  [98.1 F (36.7 C)-98.8 F (37.1 C)] 98.1 F (36.7 C) (03/08 0609) Pulse Rate:  [77-83] 77 (03/08 0609) Resp:  [17-18] 18 (03/08 0609) BP: (133-136)/(59-71) 134/71 (03/08 0609) SpO2:  [90 %-95 %] 93 % (03/08 0609) Last BM Date: 04/10/16  Intake/Output from previous day: 03/07 0701 - 03/08 0700 In: 360 [P.O.:360] Out: -  Intake/Output this shift: No intake/output data recorded.  PE: General: pleasant, WD, WN white male who is seated on the side of his bed. In NAD.  HEENT: head is normocephalic, atraumatic.  Sclera are noninjected & non-icteric. Mouth is pink and moist Heart: regular, rate, and rhythm.  Lungs: CTAB, no wheezes, rhonchi, or rales noted.  Respiratory effort nonlabored Abd: soft, NT, ND, +hypoactive BS. Skin: warm and dry but now with a new slightly pruritic rash across upper back over the past 24 hours. Rash appears as discrete erythematous blanching papular rash with a few areas of excoriation across upper back. Psych: A&Ox3 with an appropriate affect.  Lab Results:   Recent Labs  04/11/16 0433  WBC 8.0  HGB 12.4*  HCT 37.0*  PLT 230   BMET  Recent Labs  04/08/16 2328 04/10/16 0520 04/11/16 0433  NA 139  --  138  K 4.0  --  3.2*  CL 106  --  106  CO2 27  --  25  GLUCOSE 100*  --  91  BUN <5*  --  <5*  CREATININE 0.88 0.83 0.76  CALCIUM 8.2*  --  8.0*   PT/INR No results for input(s): LABPROT, INR in the last 72 hours. CMP     Component Value Date/Time   NA 138 04/11/2016 0433   K 3.2 (L) 04/11/2016 0433   CL 106 04/11/2016 0433   CO2 25 04/11/2016 0433   GLUCOSE 91 04/11/2016 0433   BUN <5 (L) 04/11/2016 0433   CREATININE 0.76 04/11/2016 0433    CALCIUM 8.0 (L) 04/11/2016 0433   PROT 5.3 (L) 04/05/2016 0518   ALBUMIN 2.7 (L) 04/05/2016 0518   AST 14 (L) 04/05/2016 0518   ALT 10 (L) 04/05/2016 0518   ALKPHOS 31 (L) 04/05/2016 0518   BILITOT 1.0 04/05/2016 0518   GFRNONAA >60 04/11/2016 0433   GFRAA >60 04/11/2016 0433   Lipase     Component Value Date/Time   LIPASE 23 04/02/2016 2022       Studies/Results: No results found.  Anti-infectives: Anti-infectives    None       Assessment/Plan  HD #8 with Partial SBO. Now clinically resolved. From surgical standpoint, patient is appropriate for discharge to home. Can advance diet as tolerated.  - history of appendectomy, umbilical hernia repair, and "some other kind of surgery." - previous SBO 2015. - CT 2/27 showed small-bowel obstruction with transition point in the mid lower abdomen - XR 2/28 showed contrast in colon Skin: probable mild folliculitis on upper back. Will add Rx for Bactroban cream to be applied to affected area BID x 7 days.     LOS: 8 days    LEE PRESSON, [facility] 04/11/2016, 8:28 AM

## 2016-04-11 NOTE — Discharge Summary (Signed)
[facility]  Physician Discharge Summary   Patient ID: Lonnie Schaefer MRN: [id number] DOB/AGE: Dec 28, 1940 76 y.o.  Admit date: 04/02/2016 Discharge date: 04/11/2016  PCP: [name], FNP  DISCHARGE DIAGNOSES:  Principal Problem:   Small bowel obstruction Active Problems:   COPD (chronic obstructive pulmonary disease) (HCC)   Leukocytosis   SBO (small bowel obstruction)   Hyperbilirubinemia   AKI (acute kidney injury) ([place])   RECOMMENDATIONS FOR OUTPATIENT FOLLOW UP: 1. Outpatient follow-up with primary care provider   DISCHARGE CONDITION: fair  Diet recommendation: As before  [facility] Weights   04/02/16 2021 04/03/16 0937  Weight: 74.4 kg (164 lb) 74.6 kg (164 lb 7.4 oz)    INITIAL HISTORY: 76 year old male with a past medical history of COPD, chronic hypoxic respiratory failure, and osteoporosis, presented with nausea, vomiting, abdominal pain. Patient found to have small bowel obstruction. Patient was hospitalized for further management.  Consultations:  General surgery   HOSPITAL COURSE:   Small bowel obstruction NG tube was placed. Patient slowly started improving. General surgery was consulted. Subsequently NG tube was clamped. He was started on clear liquids which he tolerated well. NG tube was subsequently removed. Diet was advanced to soft diet. Patient feels much better. He's had bowel movements. Abdomen feels better. Has been ambulating in the hallway. Passing gas. Cleared by general surgery for discharge.   History of COPD Appears to be well compensated. Continue current medication regimen.  Leukocytosis Resolved. WBC was likely elevated due to stress margination.  Acute kidney injury Patient did have an elevated creatinine, most likely due to hypovolemia from nausea and vomiting. Improved with hydration.  Folliculitis involving back Antibacterial ointment prescribed.   Overall much improved. Patient wishes to go home. Potassium will  be repleted prior to discharge. Cleared by general surgery.    PERTINENT LABS:  The results of significant diagnostics from this hospitalization (including imaging, microbiology, ancillary and laboratory) are listed below for reference.     Labs: Basic Metabolic Panel:  Recent Labs Lab 04/05/16 0518 04/08/16 2328 04/10/16 0520 04/11/16 0433  NA 139 139  --  138  K 3.4* 4.0  --  3.2*  CL 107 106  --  106  CO2 25 27  --  25  GLUCOSE 94 100*  --  91  BUN 7 <5*  --  <5*  CREATININE 0.78 0.88 0.83 0.76  CALCIUM 7.5* 8.2*  --  8.0*   Liver Function Tests:  Recent Labs Lab 04/05/16 0518  AST 14*  ALT 10*  ALKPHOS 31*  BILITOT 1.0  PROT 5.3*  ALBUMIN 2.7*   CBC:  Recent Labs Lab 04/11/16 0433  WBC 8.0  HGB 12.4*  HCT 37.0*  MCV 91.1  PLT 230   CBG:  Recent Labs Lab 04/08/16 0737 04/09/16 0818 04/10/16 0810 04/10/16 0847 04/11/16 0752  GLUCAP 104* 97 64* 84 106*     IMAGING STUDIES Dg Abd 1 View  Result Date: 04/08/2016 CLINICAL DATA:  Nasogastric tube placement EXAM: ABDOMEN - 1 VIEW COMPARISON:  04/08/2016 FINDINGS: NG tube with tip in the gastric antrum. Side port below the GE junction. Dilated loops of small bowel. IMPRESSION: NG tube in stomach. Electronically Signed   By: Suzy Bouchard M.D.   On: 04/08/2016 15:18   Dg Abdomen 1 View  Result Date: 04/03/2016 CLINICAL DATA:  Nasogastric tube placement.  Initial encounter. EXAM: ABDOMEN - 1 VIEW COMPARISON:  CT of the abdomen and pelvis from 04/02/2016 FINDINGS: The patient's enteric tube is noted ending  at the mid esophagus, kinked at the side port and extending back superiorly. This should be removed and reinserted. There is persistent distention of small-bowel loops, reflecting small bowel obstruction. Contrast is seen within the bladder. Atelectasis or scarring is noted at the lung bases. No acute osseous abnormalities are seen. IMPRESSION: 1. Enteric tube noted ending at the mid esophagus, kinked  at the side port and extending back superiorly. This should be removed and reinserted. 2. Persistent distention of small-bowel loops, reflecting small bowel obstruction. These results were called by telephone at the time of interpretation on 04/03/2016 at 2:25 am to [facility] in the [facility] ER, who verbally acknowledged these results. Electronically Signed   By: Garald Balding M.D.   On: 04/03/2016 02:28   Ct Abdomen Pelvis W Contrast  Result Date: 04/02/2016 CLINICAL DATA:  Abdominal pain and vomiting.  Abdominal distention. EXAM: CT ABDOMEN AND PELVIS WITH CONTRAST TECHNIQUE: Multidetector CT imaging of the abdomen and pelvis was performed using the standard protocol following bolus administration of intravenous contrast. CONTRAST:  13mL ISOVUE-300 IOPAMIDOL (ISOVUE-300) INJECTION 61% COMPARISON:  CT 10/05/2013. FINDINGS: Lower chest: Motion limited evaluation. Streaky bibasilar atelectasis. Coronary artery calcifications Hepatobiliary: No focal hepatic lesion. Calcified gallstones within decompressed gallbladder. Pancreas: No ductal dilatation or inflammation. Spleen: Normal in size without focal abnormality. Small splenule at the hilum. Adrenals/Urinary Tract: No adrenal nodule. Symmetric renal enhancement and excretion on delayed phase imaging. 12 mm partially exophytic low-density lesion from the lower left kidney measuring slightly higher than simple fluid density. Urinary bladder is minimally distended. Stomach/Bowel: Stomach is distended and fluid-filled. Small bowel is dilated and fluid-filled. There is a gradual transition in the central lower abdomen with transition point identified image 71 series 2. Small bowel distal to this is decompressed. Fluid in the cecum and ascending colon. The remainder the colon is relatively decompressed. Multifocal colonic diverticulosis, advanced in the sigmoid colon without acute inflammation. Vascular/Lymphatic: Aortic tortuosity and atherosclerosis without  aneurysm. No adenopathy. Atherosclerosis of aortic branch vessels. Reproductive: Prostate gland is normal in size. Other: No significant mesenteric edema or free fluid. No free air. Fat within both inguinal canals. Postsurgical change of the anterior abdominal wall with minimal fat containing supraumbilical hernia. No inflammation. Musculoskeletal: There are no acute or suspicious osseous abnormalities. IMPRESSION: 1. Small-bowel obstruction with transition point in the mid lower abdomen. 2. Multifocal colonic diverticulosis, advanced in the sigmoid colon, no acute inflammation. 3. Aortic atherosclerosis without aneurysm. Electronically Signed   By: Jeb Levering M.D.   On: 04/02/2016 23:21   Dg Abd 2 Views  Result Date: 04/08/2016 CLINICAL DATA:  Small bowel obstruction EXAM: ABDOMEN - 2 VIEW COMPARISON:  04/03/2016 FINDINGS: Upright film shows no evidence for intraperitoneal free air. Gaseous small bowel dilatation is evident with some small bowel loops in the central abdomen measuring up to 5.5 cm diameter. Small volume of gas seen scattered along the length of the colon. IMPRESSION: Prominent gaseous dilation of central small bowel loops measuring up to 5.5 cm. Imaging features suggest small bowel obstruction. Electronically Signed   By: Misty Stanley M.D.   On: 04/08/2016 11:11   Dg Abd Portable 1v-small Bowel Obstruction Protocol-initial, 8 Hr Delay  Result Date: 04/03/2016 CLINICAL DATA:  Small-bowel obstruction, 8 hour delay fell EXAM: PORTABLE ABDOMEN - 1 VIEW COMPARISON:  None. FINDINGS: Enteric contrast has reached large bowel and extends to the distal descending colon. Contrast filled nondistended small bowel loops noted in the lower quadrants of the abdomen. There are  gas-filled mildly dilated small bowel loops also in the left lower quadrant of the abdomen. There is no pneumoperitoneum. Gastric tube is seen within nondistended contrast filled stomach. Small amount of contrast also noted along  the second portion of duodenum. IMPRESSION: The pattern of oral contrast within small and large bowel suggests the presence of a partial small bowel obstruction. Electronically Signed   By: Ashley Royalty M.D.   On: 04/03/2016 21:17   Dg Abd Portable 1v  Result Date: 04/03/2016 CLINICAL DATA:  Nasogastric tube confirmation. EXAM: PORTABLE ABDOMEN - 1 VIEW COMPARISON:  Abdominal radiograph April 03, 2016 at 0143 hours FINDINGS: Nasogastric tube tip and side-port canal project in proximal to mid stomach. Multiple loops of gas distended small bowel in the included abdomen. Bibasilar atelectasis. Soft tissue planes and included osseous structures are unchanged. IMPRESSION: Repositioned nasogastric tube tip projects in proximal to mid stomach. Partially imaged probable small bowel obstruction. Electronically Signed   By: Elon Alas M.D.   On: 04/03/2016 04:09    DISCHARGE EXAMINATION: Vitals:   04/10/16 1358 04/10/16 2134 04/11/16 0609 04/11/16 0914  BP: (!) 133/59 136/61 134/71   Pulse: 83 82 77 77  Resp: 18 17 18 18   Temp: 98.3 F (36.8 C) 98.8 F (37.1 C) 98.1 F (36.7 C)   TempSrc: Oral Oral Oral   SpO2: 90% 95% 93% 94%  Weight:      Height:       General appearance: alert, cooperative, appears stated age and no distress Resp: clear to auscultation bilaterally Cardio: regular rate and rhythm, S1, S2 normal, no murmur, click, rub or gallop GI: soft, non-tender; bowel sounds normal; no masses,  no organomegaly Extremities: extremities normal, atraumatic, no cyanosis or edema Skin: Patient found to have multiple erythematous raised areas on his back. Could be folliculitis.  DISPOSITION: Home  Discharge Instructions    Call MD for:  difficulty breathing, headache or visual disturbances    Complete by:  As directed    Call MD for:  extreme fatigue    Complete by:  As directed    Call MD for:  persistant dizziness or light-headedness    Complete by:  As directed    Call MD for:   persistant nausea and vomiting    Complete by:  As directed    Call MD for:  severe uncontrolled pain    Complete by:  As directed    Call MD for:  severe uncontrolled pain    Complete by:  As directed    Call MD for:  temperature >100.4    Complete by:  As directed    Call MD for:  temperature >100.4    Complete by:  As directed    Diet - low sodium heart healthy    Complete by:  As directed    Discharge instructions    Complete by:  As directed    Please follow with your primary care provider in one week. Avoid constipation.  You were cared for by a hospitalist during your hospital stay. If you have any questions about your discharge medications or the care you received while you were in the hospital after you are discharged, you can call the unit and asked to speak with the hospitalist on call if the hospitalist that took care of you is not available. Once you are discharged, your primary care physician will handle any further medical issues. Please note that NO REFILLS for any discharge medications will be authorized once you are  discharged, as it is imperative that you return to your primary care physician (or establish a relationship with a primary care physician if you do not have one) for your aftercare needs so that they can reassess your need for medications and monitor your lab values. If you do not have a primary care physician, you can call [phone] for a physician referral.   Increase activity slowly    Complete by:  As directed       ALLERGIES: No Known Allergies   Discharge Medication List as of 04/11/2016  1:45 PM    START taking these medications   Details  mupirocin cream (BACTROBAN) 2 % Apply topically 2 (two) times daily. Apply to rash on the back, Starting Thu 04/11/2016, Normal    traMADol (ULTRAM) 50 MG tablet Take 1 tablet (50 mg total) by mouth every 12 (twelve) hours as needed for moderate pain., Starting Thu 04/11/2016, Print      CONTINUE these medications  which have NOT CHANGED   Details  alendronate (FOSAMAX) 70 MG tablet Take 70 mg by mouth every Wednesday., Starting Mon 03/25/2016, Historical Med    aspirin 81 MG chewable tablet Chew 81 mg by mouth daily., Until Discontinued, Historical Med    OXYGEN-HELIUM IN Inhale 2 each into the lungs See admin instructions. Inhale 2 liters at bedtime, Until Discontinued, Historical Med    tiotropium (SPIRIVA) 18 MCG inhalation capsule Place 18 mcg into inhaler and inhale daily., Until Discontinued, Historical Med    albuterol (PROVENTIL HFA;VENTOLIN HFA) 108 (90 BASE) MCG/ACT inhaler Inhale 1 puff into the lungs every 6 (six) hours as needed for wheezing. , Until Discontinued, Historical Med      STOP taking these medications     polyethylene glycol (MIRALAX / GLYCOLAX) packet         Follow-up Information    ANDERSON,TERESA, FNP. Schedule an appointment as soon as possible for a visit in 1 week(s).   Specialty:  Nurse Practitioner Contact information: [place] [address] [phone]           TOTAL DISCHARGE TIME: 19 minutes  [facility] Pager [phone]  04/11/2016, 4:15 PM

## 2016-04-12 DIAGNOSIS — L0102 Bockhart's impetigo: Secondary | ICD-10-CM | POA: Diagnosis not present

## 2016-04-12 DIAGNOSIS — J449 Chronic obstructive pulmonary disease, unspecified: Secondary | ICD-10-CM | POA: Diagnosis not present

## 2016-04-12 DIAGNOSIS — Z8719 Personal history of other diseases of the digestive system: Secondary | ICD-10-CM | POA: Diagnosis not present

## 2016-04-12 DIAGNOSIS — E876 Hypokalemia: Secondary | ICD-10-CM | POA: Diagnosis not present

## 2016-04-19 DIAGNOSIS — E876 Hypokalemia: Secondary | ICD-10-CM | POA: Diagnosis not present

## 2016-05-14 DIAGNOSIS — G4734 Idiopathic sleep related nonobstructive alveolar hypoventilation: Secondary | ICD-10-CM | POA: Diagnosis not present

## 2016-05-14 DIAGNOSIS — J449 Chronic obstructive pulmonary disease, unspecified: Secondary | ICD-10-CM | POA: Diagnosis not present

## 2016-05-14 DIAGNOSIS — R7301 Impaired fasting glucose: Secondary | ICD-10-CM | POA: Diagnosis not present

## 2016-05-14 DIAGNOSIS — M810 Age-related osteoporosis without current pathological fracture: Secondary | ICD-10-CM | POA: Diagnosis not present

## 2016-06-06 ENCOUNTER — Emergency Department (HOSPITAL_COMMUNITY)
Admission: EM | Admit: 2016-06-06 | Discharge: 2016-06-06 | Disposition: A | Discharge status: Home / Self Care | Payer: Medicare Other | Attending: Emergency Medicine | Admitting: Emergency Medicine

## 2016-06-06 ENCOUNTER — Other Ambulatory Visit: Payer: Self-pay

## 2016-06-06 ENCOUNTER — Emergency Department (HOSPITAL_COMMUNITY): Payer: Medicare Other

## 2016-06-06 ENCOUNTER — Encounter (HOSPITAL_COMMUNITY): Payer: Self-pay | Admitting: Emergency Medicine

## 2016-06-06 DIAGNOSIS — R109 Unspecified abdominal pain: Secondary | ICD-10-CM

## 2016-06-06 DIAGNOSIS — R1084 Generalized abdominal pain: Secondary | ICD-10-CM | POA: Diagnosis not present

## 2016-06-06 DIAGNOSIS — R197 Diarrhea, unspecified: Secondary | ICD-10-CM | POA: Diagnosis not present

## 2016-06-06 DIAGNOSIS — Z7982 Long term (current) use of aspirin: Secondary | ICD-10-CM | POA: Diagnosis not present

## 2016-06-06 DIAGNOSIS — Z87891 Personal history of nicotine dependence: Secondary | ICD-10-CM | POA: Insufficient documentation

## 2016-06-06 DIAGNOSIS — R1110 Vomiting, unspecified: Secondary | ICD-10-CM | POA: Diagnosis not present

## 2016-06-06 DIAGNOSIS — J449 Chronic obstructive pulmonary disease, unspecified: Secondary | ICD-10-CM | POA: Insufficient documentation

## 2016-06-06 DIAGNOSIS — R0602 Shortness of breath: Secondary | ICD-10-CM | POA: Diagnosis not present

## 2016-06-06 LAB — LIPASE, BLOOD: Lipase: 25 U/L (ref 11–51)

## 2016-06-06 LAB — COMPREHENSIVE METABOLIC PANEL
ALT: 12 U/L — ABNORMAL LOW (ref 17–63)
AST: 27 U/L (ref 15–41)
Albumin: 3.7 g/dL (ref 3.5–5.0)
Alkaline Phosphatase: 43 U/L (ref 38–126)
Anion gap: 12 (ref 5–15)
BUN: 9 mg/dL (ref 6–20)
CO2: 23 mmol/L (ref 22–32)
Calcium: 9.3 mg/dL (ref 8.9–10.3)
Chloride: 104 mmol/L (ref 101–111)
Creatinine, Ser: 1.04 mg/dL (ref 0.61–1.24)
GFR calc Af Amer: >60 mL/min (ref >60)
GFR calc non Af Amer: >60 mL/min (ref >60)
Glucose, Bld: 169 mg/dL — ABNORMAL HIGH (ref 65–99)
Potassium: 3.9 mmol/L (ref 3.5–5.1)
Sodium: 139 mmol/L (ref 135–145)
Total Bilirubin: 1.6 mg/dL — ABNORMAL HIGH (ref 0.3–1.2)
Total Protein: 6.9 g/dL (ref 6.5–8.1)

## 2016-06-06 LAB — CBC
HCT: 43.2 % (ref 39.0–52.0)
Hemoglobin: 14.1 g/dL (ref 13.0–17.0)
MCH: 30.3 pg (ref 26.0–34.0)
MCHC: 32.6 g/dL (ref 30.0–36.0)
MCV: 92.7 fL (ref 78.0–100.0)
Platelets: 252 10*3/uL (ref 150–400)
RBC: 4.66 MIL/uL (ref 4.22–5.81)
RDW: 13.4 % (ref 11.5–15.5)
WBC: 8.5 10*3/uL (ref 4.0–10.5)

## 2016-06-06 LAB — URINALYSIS, ROUTINE W REFLEX MICROSCOPIC
Bilirubin Urine: NEGATIVE
Glucose, UA: NEGATIVE mg/dL
Hgb urine dipstick: NEGATIVE
Ketones, ur: NEGATIVE mg/dL
Leukocytes, UA: NEGATIVE
Nitrite: NEGATIVE
Protein, ur: NEGATIVE mg/dL
Specific Gravity, Urine: >1.046 — ABNORMAL HIGH (ref 1.005–1.030)
pH: 8 (ref 5.0–8.0)

## 2016-06-06 MED ORDER — SODIUM CHLORIDE 0.9 % IV BOLUS (SEPSIS)
1000.0000 mL | Freq: Once | INTRAVENOUS | Status: AC
  Administered 2016-06-06: 1000 mL via INTRAVENOUS

## 2016-06-06 MED ORDER — IOPAMIDOL (ISOVUE-300) INJECTION 61%
INTRAVENOUS | Status: AC
  Administered 2016-06-06: 100 mL
  Filled 2016-06-06: qty 100

## 2016-06-06 MED ORDER — MORPHINE SULFATE (PF) 4 MG/ML IV SOLN
4.0000 mg | Freq: Once | INTRAVENOUS | Status: AC
  Administered 2016-06-06: 4 mg via INTRAVENOUS
  Filled 2016-06-06: qty 1

## 2016-06-06 MED ORDER — ONDANSETRON HCL 4 MG PO TABS: 4.0000 mg | ORAL_TABLET | Freq: Four times a day (QID) | ORAL | 0 refills | Status: DC

## 2016-06-06 MED ORDER — OXYCODONE-ACETAMINOPHEN 5-325 MG PO TABS: 1.0000 | ORAL_TABLET | ORAL | 0 refills | Status: DC | PRN

## 2016-06-06 MED ORDER — ONDANSETRON HCL 4 MG/2ML IJ SOLN
4.0000 mg | Freq: Once | INTRAMUSCULAR | Status: AC
  Administered 2016-06-06: 4 mg via INTRAVENOUS
  Filled 2016-06-06: qty 2

## 2016-06-06 NOTE — ED Notes (Signed)
SPO2 alarm at 81%. Went to room to check on patient, pt had vomited X1, approximately 100cc of emesis. Pt placed on 2L of O2 via nasal cannula and improved to 97%. Pt appears less distressed.

## 2016-06-06 NOTE — Discharge Instructions (Signed)
CT scan showed no acute findings. Prescription for pain and nausea medication.

## 2016-06-06 NOTE — ED Notes (Signed)
Pt states he understands insrtuction and home stable with daughter.

## 2016-06-06 NOTE — ED Provider Notes (Signed)
[facility] DEPT Provider Note   CSN: [id number] Arrival date & time: 06/06/16  0720     History   Chief Complaint Chief Complaint  Patient presents with  . Shortness of Breath  . Emesis    HPI Lonnie Schaefer is a 76 y.o. male.  Abdominal pain, vomiting, diarrhea since 12:15 AM today. He has had a small bowel obstruction in the past and is concerned that this has returned. Patient has COPD and is on 2 L of nasal oxygen on regular basis. No fever, sweats, chills, cough, dyspnea, chest pain.  Severity of symptoms is moderate. Nothing makes sxs better or worse.      Past Medical History:  Diagnosis Date  . Bowel obstruction ([place]) 06/2009-10/01/2013 X 5; 04/03/2016  . COPD (chronic obstructive pulmonary disease) ([place])   . H/O hiatal hernia   . History of stomach ulcers ~ 1960  . On home oxygen therapy    "2L q hs" (04/03/2016)  . Pneumonia 1990s X 1  . Shortness of breath     Patient Active Problem List   Diagnosis Date Noted  . Leukocytosis 04/03/2016  . SBO (small bowel obstruction) ([place]) 04/03/2016  . Hyperbilirubinemia 04/03/2016  . AKI (acute kidney injury) ([place]) 04/03/2016  . Nausea, vomiting and diarrhea 10/01/2013  . Small bowel obstruction ([place]) 12/04/2012  . COPD (chronic obstructive pulmonary disease) ([place]) 02/23/2007    Past Surgical History:  Procedure Laterality Date  . ABDOMINAL EXPLORATION SURGERY  06/2009; 10/2009  . APPENDECTOMY  10/2009  . HERNIA REPAIR     "naval"  . 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 UMBILICAL HERNIA REPAIR  06/2009   w/mesh       Home Medications    Prior to Admission medications   Medication Sig Start Date End Date Taking? Authorizing Provider  albuterol (PROVENTIL HFA;VENTOLIN HFA) 108 (90 BASE) MCG/ACT inhaler Inhale 1 puff into the lungs every 6 (six) hours as needed for wheezing.    Yes Historical Provider, MD  alendronate (FOSAMAX) 70 MG tablet Take 70 mg by mouth every Wednesday.  03/25/16  Yes Historical Provider, MD  aspirin 81 MG chewable tablet Chew 81 mg by mouth daily.   Yes Historical Provider, MD  clobetasol ointment (TEMOVATE) 6.71 % Apply 1 application topically 2 (two) times daily as needed (rash).   Yes Historical Provider, MD  mupirocin cream (BACTROBAN) 2 % Apply topically 2 (two) times daily. Apply to rash on the back Patient taking differently: Apply 1 application topically 2 (two) times daily as needed. Apply to rash on the back 04/11/16  Yes Bonnielee Haff, MD  OXYGEN-HELIUM IN Inhale 2 each into the lungs See admin instructions. Inhale 2 liters at bedtime   Yes Historical Provider, MD  tiotropium (SPIRIVA) 18 MCG inhalation capsule Place 18 mcg into inhaler and inhale daily.   Yes Historical Provider, MD  traMADol (ULTRAM) 50 MG tablet Take 1 tablet (50 mg total) by mouth every 12 (twelve) hours as needed for moderate pain. 04/11/16  Yes Bonnielee Haff, MD  ondansetron (ZOFRAN) 4 MG tablet Take 1 tablet (4 mg total) by mouth every 6 (six) hours. 06/06/16   Nat Christen, MD  oxyCODONE-acetaminophen (PERCOCET) 5-325 MG tablet Take 1-2 tablets by mouth every 4 (four) hours as needed. 06/06/16   Nat Christen, MD    Family History No family history on file.  Social History Social History  Substance Use Topics  . Smoking status: Former Smoker  Packs/day: 2.00    Years: 50.00    Types: Cigarettes    Quit date: 10/15/2001  . Smokeless tobacco: Never Used  . Alcohol use Yes     Comment: 04/03/2016 "quit drinking 01/1987"     Allergies   Patient has no known allergies.   Review of Systems Review of Systems  All other systems reviewed and are negative.    Physical Exam Updated Vital Signs BP (!) 145/101   Pulse 84   Temp 97.6 F (36.4 C) (Oral)   Resp 18   Ht 5\' 10"  (1.778 m)   Wt 155 lb (70.3 kg)   SpO2 94%   BMI 22.24 kg/m   Physical Exam  Constitutional: He is oriented to person, place, and time. He appears well-developed and well-nourished.    HENT:  Head: Normocephalic and atraumatic.  Eyes: Conjunctivae are normal.  Neck: Neck supple.  Cardiovascular: Normal rate and regular rhythm.   Pulmonary/Chest: Effort normal and breath sounds normal.  Abdominal:  Generalized abdominal tenderness.  Musculoskeletal: Normal range of motion.  Neurological: He is alert and oriented to person, place, and time.  Skin: Skin is warm and dry.  Psychiatric: He has a normal mood and affect. His behavior is normal.  Nursing note and vitals reviewed.    ED Treatments / Results  Labs (all labs ordered are listed, but only abnormal results are displayed) Labs Reviewed  COMPREHENSIVE METABOLIC PANEL - Abnormal; Notable for the following:       Result Value   Glucose, Bld 169 (*)    ALT 12 (*)    Total Bilirubin 1.6 (*)    All other components within normal limits  URINALYSIS, ROUTINE W REFLEX MICROSCOPIC - Abnormal; Notable for the following:    APPearance CLOUDY (*)    Specific Gravity, Urine >1.046 (*)    All other components within normal limits  LIPASE, BLOOD  CBC    EKG  EKG Interpretation  Date/Time:  Thursday Jun 06 2016 07:57:31 EDT Ventricular Rate:  100 PR Interval:    QRS Duration: 83 QT Interval:  324 QTC Calculation: 418 R Axis:   71 Text Interpretation:  Sinus tachycardia Anteroseptal infarct, old Baseline wander in lead(s) V4 Confirmed by Aneri Slagel  MD, Fabien Travelstead ([id number]) on 06/06/2016 10:17:56 AM Also confirmed by Lacinda Axon  MD, Carlyle Mcelrath ([id number])  on 06/06/2016 10:18:10 AM       Radiology Dg Chest 2 View  Result Date: 06/06/2016 CLINICAL DATA:  Short of breath.  History COPD. EXAM: CHEST  2 VIEW COMPARISON:  11/28/2014 FINDINGS: The cardiac silhouette is normal in size and configuration. No mediastinal or hilar masses. No evidence of adenopathy. Lungs are mildly hyperexpanded. Mild interstitial thickening is noted bilaterally, stable. No evidence of pneumonia or pulmonary edema. There is stable mild elevation right hemidiaphragm. No  pleural effusion or pneumothorax. Skeletal structures are intact. IMPRESSION: No acute cardiopulmonary disease. Electronically Signed   By: Lajean Manes M.D.   On: 06/06/2016 07:44   Ct Abdomen Pelvis W Contrast  Result Date: 06/06/2016 CLINICAL DATA:  Acute onset diffuse abdominal pain, nausea and vomiting, and diarrhea beginning yesterday. Previous small-bowel obstruction and appendectomy. EXAM: CT ABDOMEN AND PELVIS WITH CONTRAST TECHNIQUE: Multidetector CT imaging of the abdomen and pelvis was performed using the standard protocol following bolus administration of intravenous contrast. CONTRAST:  100 mL ISOVUE-300 IOPAMIDOL (ISOVUE-300) INJECTION 61% COMPARISON:  04/02/2016 FINDINGS: Lower Chest: No acute findings. Stable eventration of right hemidiaphragm. Hepatobiliary: No masses identified. Tiny gallstones are again noted,  without evidence of acute cholecystitis or biliary ductal dilatation. Pancreas:  No mass or inflammatory changes. Spleen: Within normal limits in size and appearance. Adrenals/Urinary Tract: No masses identified. Stable small subcapsular left lower pole renal cyst. 7 mm nonobstructing calculus seen in upper pole of left kidney. No evidence of hydronephrosis. No evidence of ureteral calculi or dilatation. A few tiny nonobstructive calculi noted within urinary bladder. Stomach/Bowel: Resolution of small bowel obstruction since prior study. A small periumbilical ventral hernia is again seen which contains a loop of small bowel, however there is no evidence of small bowel obstruction or ischemia. Severe diverticulosis is seen involving the descending sigmoid colon, however there is no evidence of diverticulitis. Vascular/Lymphatic: No pathologically enlarged lymph nodes. No abdominal aortic aneurysm. Aortic atherosclerosis. Reproductive:  No mass or other significant abnormality. Other:  None. Musculoskeletal:  No suspicious bone lesions identified. IMPRESSION: No acute findings. Interval  resolution of small bowel obstruction since previous study. Small periumbilical hernia again seen containing loop of small bowel, however no evidence of bowel obstruction or ischemia. Colonic diverticulosis. No radiographic evidence of diverticulitis. Cholelithiasis.  No radiographic evidence of cholecystitis. Nonobstructing calculi within left kidney and urinary bladder. No evidence of ureteral calculi or hydronephrosis. Aortic atherosclerosis. Electronically Signed   By: Earle Gell M.D.   On: 06/06/2016 09:34    Procedures Procedures (including critical care time)  Medications Ordered in ED Medications  ondansetron (ZOFRAN) injection 4 mg (4 mg Intravenous Given 06/06/16 0810)  sodium chloride 0.9 % bolus 1,000 mL (0 mLs Intravenous Stopped 06/06/16 1020)  morphine 4 MG/ML injection 4 mg (4 mg Intravenous Given 06/06/16 0813)  iopamidol (ISOVUE-300) 61 % injection (100 mLs  Contrast Given 06/06/16 0911)  morphine 4 MG/ML injection 4 mg (4 mg Intravenous Given 06/06/16 1052)     Initial Impression / Assessment and Plan / ED Course  I have reviewed the triage vital signs and the nursing notes.  Pertinent labs & imaging results that were available during my care of the patient were reviewed by me and considered in my medical decision making (see chart for details).     Patient is hemodynamically stable. CT scan reveals no obvious obstruction. He feels better after IV hydration and pain management. No acute abdomen at discharge. Discharge medications Percocet and Zofran 4 mg.  Final Clinical Impressions(s) / ED Diagnoses   Final diagnoses:  Abdominal pain, unspecified abdominal location    New Prescriptions New Prescriptions   ONDANSETRON (ZOFRAN) 4 MG TABLET    Take 1 tablet (4 mg total) by mouth every 6 (six) hours.   OXYCODONE-ACETAMINOPHEN (PERCOCET) 5-325 MG TABLET    Take 1-2 tablets by mouth every 4 (four) hours as needed.     Nat Christen, MD 06/06/16 1300

## 2016-06-06 NOTE — ED Triage Notes (Signed)
Pt reports hx of intestinal blockage, started having n/v/d last night as well as sob. Hx of copd, wears 2L O2 at night prn. Pt a/ox4, resp labored in triage.

## 2016-06-11 DIAGNOSIS — R109 Unspecified abdominal pain: Secondary | ICD-10-CM | POA: Diagnosis not present

## 2016-06-11 DIAGNOSIS — G8929 Other chronic pain: Secondary | ICD-10-CM | POA: Diagnosis not present

## 2016-06-27 DIAGNOSIS — Z85819 Personal history of malignant neoplasm of unspecified site of lip, oral cavity, and pharynx: Secondary | ICD-10-CM | POA: Diagnosis not present

## 2016-09-05 ENCOUNTER — Inpatient Hospital Stay (HOSPITAL_COMMUNITY)
Admission: EM | Admit: 2016-09-05 | Discharge: 2016-09-08 | DRG: 417 | Disposition: A | Discharge status: Home / Self Care | Payer: Medicare Other | Attending: Family Medicine | Admitting: Family Medicine

## 2016-09-05 ENCOUNTER — Emergency Department (HOSPITAL_COMMUNITY): Payer: Medicare Other

## 2016-09-05 ENCOUNTER — Encounter (HOSPITAL_COMMUNITY): Payer: Self-pay | Admitting: Emergency Medicine

## 2016-09-05 DIAGNOSIS — Z79891 Long term (current) use of opiate analgesic: Secondary | ICD-10-CM | POA: Diagnosis not present

## 2016-09-05 DIAGNOSIS — M810 Age-related osteoporosis without current pathological fracture: Secondary | ICD-10-CM | POA: Diagnosis present

## 2016-09-05 DIAGNOSIS — K8000 Calculus of gallbladder with acute cholecystitis without obstruction: Secondary | ICD-10-CM | POA: Diagnosis not present

## 2016-09-05 DIAGNOSIS — Z7982 Long term (current) use of aspirin: Secondary | ICD-10-CM

## 2016-09-05 DIAGNOSIS — K5730 Diverticulosis of large intestine without perforation or abscess without bleeding: Secondary | ICD-10-CM | POA: Diagnosis not present

## 2016-09-05 DIAGNOSIS — K810 Acute cholecystitis: Secondary | ICD-10-CM | POA: Diagnosis present

## 2016-09-05 DIAGNOSIS — J449 Chronic obstructive pulmonary disease, unspecified: Secondary | ICD-10-CM | POA: Diagnosis not present

## 2016-09-05 DIAGNOSIS — Z79899 Other long term (current) drug therapy: Secondary | ICD-10-CM | POA: Diagnosis not present

## 2016-09-05 DIAGNOSIS — Z87891 Personal history of nicotine dependence: Secondary | ICD-10-CM

## 2016-09-05 DIAGNOSIS — Z9981 Dependence on supplemental oxygen: Secondary | ICD-10-CM | POA: Diagnosis not present

## 2016-09-05 DIAGNOSIS — K8020 Calculus of gallbladder without cholecystitis without obstruction: Secondary | ICD-10-CM | POA: Diagnosis not present

## 2016-09-05 DIAGNOSIS — Z7983 Long term (current) use of bisphosphonates: Secondary | ICD-10-CM | POA: Diagnosis not present

## 2016-09-05 DIAGNOSIS — Z419 Encounter for procedure for purposes other than remedying health state, unspecified: Secondary | ICD-10-CM

## 2016-09-05 DIAGNOSIS — R112 Nausea with vomiting, unspecified: Secondary | ICD-10-CM | POA: Diagnosis not present

## 2016-09-05 DIAGNOSIS — K56609 Unspecified intestinal obstruction, unspecified as to partial versus complete obstruction: Secondary | ICD-10-CM | POA: Diagnosis not present

## 2016-09-05 DIAGNOSIS — Z8711 Personal history of peptic ulcer disease: Secondary | ICD-10-CM | POA: Diagnosis not present

## 2016-09-05 DIAGNOSIS — J9610 Chronic respiratory failure, unspecified whether with hypoxia or hypercapnia: Secondary | ICD-10-CM | POA: Diagnosis not present

## 2016-09-05 DIAGNOSIS — K822 Perforation of gallbladder: Secondary | ICD-10-CM | POA: Diagnosis not present

## 2016-09-05 DIAGNOSIS — R10811 Right upper quadrant abdominal tenderness: Secondary | ICD-10-CM | POA: Diagnosis not present

## 2016-09-05 DIAGNOSIS — K449 Diaphragmatic hernia without obstruction or gangrene: Secondary | ICD-10-CM | POA: Diagnosis not present

## 2016-09-05 LAB — URINALYSIS, ROUTINE W REFLEX MICROSCOPIC
Bacteria, UA: NONE SEEN
Bilirubin Urine: NEGATIVE
Glucose, UA: NEGATIVE mg/dL
Hgb urine dipstick: NEGATIVE
Ketones, ur: 5 mg/dL — AB
Leukocytes, UA: NEGATIVE
Nitrite: NEGATIVE
Protein, ur: 30 mg/dL — AB
Specific Gravity, Urine: 1.019 (ref 1.005–1.030)
Squamous Epithelial / LPF: NONE SEEN
pH: 7 (ref 5.0–8.0)

## 2016-09-05 LAB — CBC
HCT: 41.4 % (ref 39.0–52.0)
Hemoglobin: 14.1 g/dL (ref 13.0–17.0)
MCH: 30.7 pg (ref 26.0–34.0)
MCHC: 34.1 g/dL (ref 30.0–36.0)
MCV: 90 fL (ref 78.0–100.0)
Platelets: 292 10*3/uL (ref 150–400)
RBC: 4.6 MIL/uL (ref 4.22–5.81)
RDW: 13.1 % (ref 11.5–15.5)
WBC: 9.4 10*3/uL (ref 4.0–10.5)

## 2016-09-05 LAB — COMPREHENSIVE METABOLIC PANEL
ALT: 13 U/L — ABNORMAL LOW (ref 17–63)
AST: 18 U/L (ref 15–41)
Albumin: 3.3 g/dL — ABNORMAL LOW (ref 3.5–5.0)
Alkaline Phosphatase: 57 U/L (ref 38–126)
Anion gap: 10 (ref 5–15)
BUN: 10 mg/dL (ref 6–20)
CO2: 27 mmol/L (ref 22–32)
Calcium: 9.1 mg/dL (ref 8.9–10.3)
Chloride: 100 mmol/L — ABNORMAL LOW (ref 101–111)
Creatinine, Ser: 1.03 mg/dL (ref 0.61–1.24)
GFR calc Af Amer: >60 mL/min (ref >60)
GFR calc non Af Amer: >60 mL/min (ref >60)
Glucose, Bld: 118 mg/dL — ABNORMAL HIGH (ref 65–99)
Potassium: 3.8 mmol/L (ref 3.5–5.1)
Sodium: 137 mmol/L (ref 135–145)
Total Bilirubin: 1.7 mg/dL — ABNORMAL HIGH (ref 0.3–1.2)
Total Protein: 7.2 g/dL (ref 6.5–8.1)

## 2016-09-05 LAB — LIPASE, BLOOD: Lipase: 22 U/L (ref 11–51)

## 2016-09-05 MED ORDER — PIPERACILLIN-TAZOBACTAM 3.375 G IVPB 30 MIN
3.3750 g | Freq: Once | INTRAVENOUS | Status: AC
  Administered 2016-09-05: 3.375 g via INTRAVENOUS
  Filled 2016-09-05: qty 50

## 2016-09-05 MED ORDER — IOPAMIDOL (ISOVUE-300) INJECTION 61%
INTRAVENOUS | Status: AC
  Administered 2016-09-05: 100 mL
  Filled 2016-09-05: qty 100

## 2016-09-05 MED ORDER — ONDANSETRON HCL 4 MG/2ML IJ SOLN
4.0000 mg | Freq: Once | INTRAMUSCULAR | Status: AC
  Administered 2016-09-05: 4 mg via INTRAVENOUS
  Filled 2016-09-05: qty 2

## 2016-09-05 MED ORDER — MORPHINE SULFATE (PF) 4 MG/ML IV SOLN
4.0000 mg | INTRAVENOUS | Status: DC | PRN
  Administered 2016-09-05 (×2): 4 mg via INTRAVENOUS
  Filled 2016-09-05 (×2): qty 1

## 2016-09-05 MED ORDER — SODIUM CHLORIDE 0.9 % IV BOLUS (SEPSIS)
1000.0000 mL | Freq: Once | INTRAVENOUS | Status: AC
Start: 2016-09-05 — End: 2016-09-05
  Administered 2016-09-05: 1000 mL via INTRAVENOUS

## 2016-09-05 MED ORDER — SODIUM CHLORIDE 0.9 % IV SOLN
1000.0000 mL | INTRAVENOUS | Status: DC
  Administered 2016-09-05 – 2016-09-08 (×7): 1000 mL via INTRAVENOUS

## 2016-09-05 NOTE — ED Notes (Addendum)
Pt c/o constant, 6/10 discomfort that started Sunday at 0200. Pt states the pain woke him up & states he has had intestinal blockages in the past where his intestines "twist on each other" and this feels the same. Pt states being awoken at 0200 this morning with sharp pain that "paralyzed him" with pain, per pt's daughter. Pt has a hx of abdominal surgery x 7 years ago; pt c/o nausea but has not vomited since 1400 yesterday; pt reports having diarrhea this morning at 1000. Pt reports having diarrhea x 3 days.

## 2016-09-05 NOTE — H&P (Signed)
[facility] Admission History and Physical Service Pager: [phone]  Patient name: Lonnie Schaefer Medical record number: [id number] Date of birth: [birth year] Age: 76 y.o. Gender: male  Primary Care Provider: [name], [place] Consultants: Surgery Code Status: Full code ( per discussion on admission)  Chief Complaint: Nausea, vomiting, abdominal pain  Assessment and Plan: Lonnie Schaefer is a 75 y.o. male presenting with nausea, vomiting, abdominal pain . PMH is significant for multiple small bowel obstructions, COPD, chronic respiratory failure, osteoporosis  N/V/abdominal pain due to cholecystitis: Vitals within normal limits on admission. Physical exam showing diffuse abdominal tenderness, mostly in the right upper quadrant, hyperactive bowel sounds. Electrolytes within normal limits. Creatinine normal at 1.03. LFTs not elevated with a AST of 18 and a PLT of 13. Slightly elevated T bili of 1.7. No leukocytosis and CBC unremarkable. UA showing 5 ketones and 30 protein, no nitrites or leukocytes. CT abdomen showing Perforated gallbladder with a loculated fluid adjacent the gallbladder fundus likely secondary to gangrenous cholecystitis and colonic diverticulosis. No bowel obstruction.  -Admit to family medicine teaching service, MedSurg, attending Dr. Erin Hearing -Vital signs per floor protocol -Surgery consulted, appreciate assistance -Nothing by mouth -Morphine when necessary for pain -Zofran when necessary for nausea -Continue Zosyn  -maintenance IV fluids -We will obtain baseline EKG -Consider ordering PT/OT after surgery  COPD with chronic respiratory failure: Home medications include Spiriva and albuterol when necessary. Uses 2 L of oxygen at night. Respiratory exam largely normal with exception of faint wheezing bilaterally. No signs of hypoxia. Quit smoking in 2001. -Continue home Spiriva -Albuterol when necessary -Pulse ox with vitals -2 L oxygen at  night  History of osteoporosis -Continue home Fosamax  FEN/GI: nothing by mouth, maintenance IV fluids Prophylaxis: SCDs  Disposition: Admit to telemetry, MedSurg, attending Dr. Erin Hearing  History of Present Illness:  Lonnie Schaefer is a 76 y.o. male presenting with nausea, vomiting, abdominal pain.  Patient notes that for the last 4 days he has had right upper quadrant abdominal pain, nausea, vomiting or mist to some intermittent diarrhea as well. Notes that he has had multiple episodes of nausea and vomiting over the last 4 days and has not been able to keep anything down. Emesis has been nonbloody and nonbilious. Diarrhea has been nonbloody and no melena. Patient has that he thought that he may have another small bowel obstruction as he has had multiple in the past (most recently in March). Denies any new foods or change in his diet. Admits to some subjective fevers at home. Denies any chest pain, shortness of breath, edema.   Patient notes that he lives with his wife. At baseline he is able to perform all ADLs and IADLs. Does not need any assistance with ambulation. He has a history of COPD and uses Spiriva daily along with 2 L of oxygen at night. Notes that he rarely has to use his albuterol inhaler.   Review Of Systems: Per HPI with the following additions: See history of present illness   ROS  Patient Active Problem List   Diagnosis Date Noted  . Leukocytosis 04/03/2016  . SBO (small bowel obstruction) ([place]) 04/03/2016  . Hyperbilirubinemia 04/03/2016  . AKI (acute kidney injury) ([place]) 04/03/2016  . Nausea, vomiting and diarrhea 10/01/2013  . Small bowel obstruction ([place]) 12/04/2012  . COPD (chronic obstructive pulmonary disease) ([place]) 02/23/2007    Past Medical History: Past Medical History:  Diagnosis Date  . Bowel obstruction ([place]) 06/2009-10/01/2013 X 5; 04/03/2016  .  COPD (chronic obstructive pulmonary disease) ([place])   . H/O hiatal hernia   . History of stomach ulcers ~  1960  . On home oxygen therapy    "2L q hs" (04/03/2016)  . Pneumonia 1990s X 1  . Shortness of breath     Past Surgical History: Past Surgical History:  Procedure Laterality Date  . ABDOMINAL EXPLORATION SURGERY  06/2009; 10/2009  . APPENDECTOMY  10/2009  . HERNIA REPAIR     "naval"  . 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 UMBILICAL HERNIA REPAIR  06/2009   w/mesh    Social History: Social History  Substance Use Topics  . Smoking status: Former Smoker    Packs/day: 2.00    Years: 50.00    Types: Cigarettes    Quit date: 10/15/2001  . Smokeless tobacco: Never Used  . Alcohol use Yes     Comment: 04/03/2016 "quit drinking 01/1987"   Please also refer to relevant sections of EMR.  Family History: History reviewed. No pertinent family history.   Allergies and Medications: No Known Allergies No current facility-administered medications on file prior to encounter.    Current Outpatient Prescriptions on File Prior to Encounter  Medication Sig Dispense Refill  . albuterol (PROVENTIL HFA;VENTOLIN HFA) 108 (90 BASE) MCG/ACT inhaler Inhale 1 puff into the lungs every 6 (six) hours as needed for wheezing.     Marland Kitchen alendronate (FOSAMAX) 70 MG tablet Take 70 mg by mouth every Wednesday.  0  . aspirin 81 MG chewable tablet Chew 81 mg by mouth daily.    . clobetasol ointment (TEMOVATE) 4.33 % Apply 1 application topically 2 (two) times daily as needed (rash).    . mupirocin cream (BACTROBAN) 2 % Apply topically 2 (two) times daily. Apply to rash on the back (Patient taking differently: Apply 1 application topically 2 (two) times daily as needed. Apply to rash on the back) 15 g 0  . OXYGEN-HELIUM IN Inhale 2 each into the lungs See admin instructions. Inhale 2 liters at bedtime    . tiotropium (SPIRIVA) 18 MCG inhalation capsule Place 18 mcg into inhaler and inhale daily.    . ondansetron (ZOFRAN) 4 MG tablet Take 1 tablet (4 mg total) by mouth every 6 (six) hours.  (Patient not taking: Reported on 09/05/2016) 12 tablet 0  . oxyCODONE-acetaminophen (PERCOCET) 5-325 MG tablet Take 1-2 tablets by mouth every 4 (four) hours as needed. (Patient not taking: Reported on 09/05/2016) 15 tablet 0  . traMADol (ULTRAM) 50 MG tablet Take 1 tablet (50 mg total) by mouth every 12 (twelve) hours as needed for moderate pain. (Patient not taking: Reported on 09/05/2016) 10 tablet 0    Objective: BP 128/70   Pulse 86   Temp (!) 97.5 F (36.4 C) (Oral)   Resp (!) 30   Ht 5\' 9"  (1.753 m)   Wt 155 lb (70.3 kg)   SpO2 94%   BMI 22.89 kg/m  Exam: General: Alert, pleasant elderly gentleman sitting up in bed in no acute distress  Eyes: Pupils myotic but equal and reactive to light, nonicteric sclera  ENTM: No lymphadenopathy, no teeth, dry mucous membranes  Neck: Supple  Cardiovascular: Regular rate and rhythm, normal S1 and S2, no murmurs appreciated, no edema Respiratory: Normal work of breathing, faint expiratory wheezing bilaterally throughout, no crackles  Gastrointestinal: Well-healed incisional scar below umbilicus.Soft, nondistended, tender to palpation throughout but mostly in the right upper quadrant, hyperactive bowel sounds  MSK: Moves all extremities spontaneously, no edema  Derm: No rashes seen  Neuro: Alert and oriented 3, cranial nerves II through XII grossly intact, 5 out of 5 strength in bilateral upper extremities, sensation grossly intact throughout, normal cerebellar testing Psych: Normal mood and affect   Labs and Imaging: CBC BMET   Recent Labs Lab 09/05/16 1640  WBC 9.4  HGB 14.1  HCT 41.4  PLT 292    Recent Labs Lab 09/05/16 1640  NA 137  K 3.8  CL 100*  CO2 27  BUN 10  CREATININE 1.03  GLUCOSE 118*  CALCIUM 9.1     Ct Abdomen Pelvis W Contrast  Result Date: 09/05/2016 CLINICAL DATA:  76 year old male with right upper quadrant abdominal pain. EXAM: CT ABDOMEN AND PELVIS WITH CONTRAST TECHNIQUE: Multidetector CT imaging of the  abdomen and pelvis was performed using the standard protocol following bolus administration of intravenous contrast. CONTRAST:  ISOVUE-300 IOPAMIDOL (ISOVUE-300) INJECTION 61% COMPARISON:  Abdominal CT dated 06/06/2016 FINDINGS: Lower chest: There is mild elevation of the right hemidiaphragm with right lung base subsegmental atelectatic changes. There is advanced coronary vascular calcification involving the LAD. No intra-abdominal free air or free fluid. Hepatobiliary: The liver is unremarkable. No intrahepatic biliary ductal dilatation. The gallbladder is mildly distended. There is diffuse enhancement of the gallbladder mucosa. There is a linear septation within the body/fundus of the gallbladder with focal area of defect through the septation. Although this may represent a gallbladder fold findings concerning for focal or rupture of the gallbladder fundus. There is a 1.4 x 3.4 cm loculated collection contiguous with the lumen of the gallbladder. Small amount of loculated fluid measuring 9 x 18 x 34 mm extends inferiorly along the lateral peritoneal wall. Findings most consistent with perforated gallbladder likely secondary to gangrenous cholecystitis. Further evaluation with ultrasound recommended. Pancreas: Multiple punctate calcific foci in the pancreas likely sequela of prior pancreatitis. The pancreas is otherwise unremarkable. No peripancreatic inflammatory changes or dilatation of the main pancreatic duct. Spleen: Normal in size without focal abnormality. Adrenals/Urinary Tract: The adrenal glands are unremarkable. There is a small left renal inferior pole cyst. Probable nonobstructing renal calculi measuring 6 mm in the inferior pole of the left kidney and 2 mm in the inferior pole of the right kidney. A 6 mm high attenuating surface in the superior pole of the left kidney may represent a nonobstructing stone versus excreted contrast. There is no hydronephrosis on either side. There is symmetric  enhancement and excretion of contrast by both kidneys. The visualized ureters and urinary bladder appear unremarkable. Stomach/Bowel: There is extensive sigmoid diverticulosis with scattered colonic diverticula without active inflammatory changes. There is no evidence of bowel obstruction. Appendectomy. Vascular/Lymphatic: There is advanced aortoiliac atherosclerotic disease. The origins of the celiac axis, SMA, IMA and the renal arteries are patent. The SMV, splenic vein, and main portal vein are patent. No portal venous gas identified. There is no adenopathy. Reproductive: The prostate and seminal vesicles are grossly unremarkable. Other: There is a small fat containing umbilical hernia. There is slight protrusion of the anterior wall of a loop of small bowel into the hernia. No associated inflammatory changes. A smaller fat containing supraumbilical hernia is also noted. Musculoskeletal: Degenerative changes of the spine at L5-S1. No acute osseous pathology. IMPRESSION: 1. Perforated gallbladder with a loculated fluid adjacent the gallbladder fundus likely secondary to gangrenous cholecystitis. Further evaluation with ultrasound recommended. 2. Colonic diverticulosis.  No bowel obstruction. 3.  Aortic Atherosclerosis (ICD10-I70.0). Electronically Signed  By: Anner Crete M.D.   On: 09/05/2016 20:00     Carlyle Dolly, MD 09/05/2016, 9:01 PM PGY-3, [place] [facility] pager: [phone], text pages welcome

## 2016-09-05 NOTE — ED Triage Notes (Signed)
Pt states he thinks he has an "intestinal blockage." Pt has had this happen before and had two surgeries in the past because of it. Symptoms started on Sunday. Pt in tears from the abd pain. Pt c/o n/v and now some diarrhea along with abd pain.

## 2016-09-05 NOTE — Consult Note (Signed)
Reason for Consult:cholecystitis with abscess Referring Physician: dr Vincent Peyer is an 76 y.o. male.  HPI: 76 yo wm with COPD/home O2 at night, h/o psbos, comes in with nausea/vomiting/RUQ pain since Sunday at 0200.  Pt states he initially thought he was having one of "usual" spells with respect to n/v/abd pain when he gets a psbo but the pain was in a difft location.  Pain constant. Poor appetite. Flatus today and BM today. Came to ED bc wasn't getting better. No melena/hematochezia. Ct showed probable perf GB with abscess  H/o psbos.  Last abd sx 2011 - dr weatherly - exp lap, loa, incidental appy.   Past Medical History:  Diagnosis Date  . Bowel obstruction ([place]) 06/2009-10/01/2013 X 5; 04/03/2016  . COPD (chronic obstructive pulmonary disease) ([place])   . H/O hiatal hernia   . History of stomach ulcers ~ 1960  . On home oxygen therapy    "2L q hs" (04/03/2016)  . Pneumonia 1990s X 1  . Shortness of breath     Past Surgical History:  Procedure Laterality Date  . ABDOMINAL EXPLORATION SURGERY  06/2009; 10/2009  . APPENDECTOMY  10/2009  . HERNIA REPAIR     "naval"  . 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 UMBILICAL HERNIA REPAIR  06/2009   w/mesh    History reviewed. No pertinent family history.  Social History:  reports that he quit smoking about 14 years ago. His smoking use included Cigarettes. He has a 100.00 pack-year smoking history. He has never used smokeless tobacco. He reports that he drinks alcohol. He reports that he does not use drugs.  Allergies: No Known Allergies  Medications: I have reviewed the patient's current medications.  Results for orders placed or performed during the hospital encounter of 09/05/16 (from the past 48 hour(s))  Lipase, blood     Status: None   Collection Time: 09/05/16  4:40 PM  Result Value Ref Range   Lipase 22 11 - 51 U/L  Comprehensive metabolic panel     Status: Abnormal   Collection Time: 09/05/16   4:40 PM  Result Value Ref Range   Sodium 137 135 - 145 mmol/L   Potassium 3.8 3.5 - 5.1 mmol/L   Chloride 100 (L) 101 - 111 mmol/L   CO2 27 22 - 32 mmol/L   Glucose, Bld 118 (H) 65 - 99 mg/dL   BUN 10 6 - 20 mg/dL   Creatinine, Ser 1.03 0.61 - 1.24 mg/dL   Calcium 9.1 8.9 - 10.3 mg/dL   Total Protein 7.2 6.5 - 8.1 g/dL   Albumin 3.3 (L) 3.5 - 5.0 g/dL   AST 18 15 - 41 U/L   ALT 13 (L) 17 - 63 U/L   Alkaline Phosphatase 57 38 - 126 U/L   Total Bilirubin 1.7 (H) 0.3 - 1.2 mg/dL   GFR calc non Af Amer >60 >60 mL/min   GFR calc Af Amer >60 >60 mL/min    Comment: (NOTE) The eGFR has been calculated using the CKD EPI equation. This calculation has not been validated in all clinical situations. eGFR's persistently <60 mL/min signify possible Chronic Kidney Disease.    Anion gap 10 5 - 15  CBC     Status: None   Collection Time: 09/05/16  4:40 PM  Result Value Ref Range   WBC 9.4 4.0 - 10.5 K/uL   RBC 4.60 4.22 - 5.81 MIL/uL   Hemoglobin 14.1 13.0 -  17.0 g/dL   HCT 41.4 39.0 - 52.0 %   MCV 90.0 78.0 - 100.0 fL   MCH 30.7 26.0 - 34.0 pg   MCHC 34.1 30.0 - 36.0 g/dL   RDW 13.1 11.5 - 15.5 %   Platelets 292 150 - 400 K/uL  Urinalysis, Routine w reflex microscopic     Status: Abnormal   Collection Time: 09/05/16  5:36 PM  Result Value Ref Range   Color, Urine AMBER (A) YELLOW    Comment: BIOCHEMICALS MAY BE AFFECTED BY COLOR   APPearance CLEAR CLEAR   Specific Gravity, Urine 1.019 1.005 - 1.030   pH 7.0 5.0 - 8.0   Glucose, UA NEGATIVE NEGATIVE mg/dL   Hgb urine dipstick NEGATIVE NEGATIVE   Bilirubin Urine NEGATIVE NEGATIVE   Ketones, ur 5 (A) NEGATIVE mg/dL   Protein, ur 30 (A) NEGATIVE mg/dL   Nitrite NEGATIVE NEGATIVE   Leukocytes, UA NEGATIVE NEGATIVE   RBC / HPF 0-5 0 - 5 RBC/hpf   WBC, UA 0-5 0 - 5 WBC/hpf   Bacteria, UA NONE SEEN NONE SEEN   Squamous Epithelial / LPF NONE SEEN NONE SEEN   Mucous PRESENT    Hyaline Casts, UA PRESENT     Ct Abdomen Pelvis W  Contrast  Result Date: 09/05/2016 CLINICAL DATA:  76 year old male with right upper quadrant abdominal pain. EXAM: CT ABDOMEN AND PELVIS WITH CONTRAST TECHNIQUE: Multidetector CT imaging of the abdomen and pelvis was performed using the standard protocol following bolus administration of intravenous contrast. CONTRAST:  ISOVUE-300 IOPAMIDOL (ISOVUE-300) INJECTION 61% COMPARISON:  Abdominal CT dated 06/06/2016 FINDINGS: Lower chest: There is mild elevation of the right hemidiaphragm with right lung base subsegmental atelectatic changes. There is advanced coronary vascular calcification involving the LAD. No intra-abdominal free air or free fluid. Hepatobiliary: The liver is unremarkable. No intrahepatic biliary ductal dilatation. The gallbladder is mildly distended. There is diffuse enhancement of the gallbladder mucosa. There is a linear septation within the body/fundus of the gallbladder with focal area of defect through the septation. Although this may represent a gallbladder fold findings concerning for focal or rupture of the gallbladder fundus. There is a 1.4 x 3.4 cm loculated collection contiguous with the lumen of the gallbladder. Small amount of loculated fluid measuring 9 x 18 x 34 mm extends inferiorly along the lateral peritoneal wall. Findings most consistent with perforated gallbladder likely secondary to gangrenous cholecystitis. Further evaluation with ultrasound recommended. Pancreas: Multiple punctate calcific foci in the pancreas likely sequela of prior pancreatitis. The pancreas is otherwise unremarkable. No peripancreatic inflammatory changes or dilatation of the main pancreatic duct. Spleen: Normal in size without focal abnormality. Adrenals/Urinary Tract: The adrenal glands are unremarkable. There is a small left renal inferior pole cyst. Probable nonobstructing renal calculi measuring 6 mm in the inferior pole of the left kidney and 2 mm in the inferior pole of the right kidney. A 6 mm high  attenuating surface in the superior pole of the left kidney may represent a nonobstructing stone versus excreted contrast. There is no hydronephrosis on either side. There is symmetric enhancement and excretion of contrast by both kidneys. The visualized ureters and urinary bladder appear unremarkable. Stomach/Bowel: There is extensive sigmoid diverticulosis with scattered colonic diverticula without active inflammatory changes. There is no evidence of bowel obstruction. Appendectomy. Vascular/Lymphatic: There is advanced aortoiliac atherosclerotic disease. The origins of the celiac axis, SMA, IMA and the renal arteries are patent. The SMV, splenic vein, and main portal vein are patent. No portal  venous gas identified. There is no adenopathy. Reproductive: The prostate and seminal vesicles are grossly unremarkable. Other: There is a small fat containing umbilical hernia. There is slight protrusion of the anterior wall of a loop of small bowel into the hernia. No associated inflammatory changes. A smaller fat containing supraumbilical hernia is also noted. Musculoskeletal: Degenerative changes of the spine at L5-S1. No acute osseous pathology. IMPRESSION: 1. Perforated gallbladder with a loculated fluid adjacent the gallbladder fundus likely secondary to gangrenous cholecystitis. Further evaluation with ultrasound recommended. 2. Colonic diverticulosis.  No bowel obstruction. 3.  Aortic Atherosclerosis (ICD10-I70.0). Electronically Signed   By: Anner Crete M.D.   On: 09/05/2016 20:00    Review of Systems  Constitutional: Negative for weight loss.  HENT: Negative for nosebleeds.   Eyes: Negative for blurred vision.  Respiratory: Negative for shortness of breath.        Use 2L o2 at night  Cardiovascular: Negative for chest pain, palpitations, orthopnea and PND.       + DOE  Gastrointestinal: Positive for abdominal pain, nausea and vomiting. Negative for blood in stool and melena.  Genitourinary:  Negative for dysuria and hematuria.  Musculoskeletal: Negative.   Skin: Negative for itching and rash.  Neurological: Negative for dizziness, focal weakness, seizures, loss of consciousness and headaches.       Denies TIAs, amaurosis fugax  Endo/Heme/Allergies: Does not bruise/bleed easily.  Psychiatric/Behavioral: The patient is not nervous/anxious.   All other systems reviewed and are negative.  Blood pressure 127/69, pulse 79, temperature 98 F (36.7 C), temperature source Oral, resp. rate 20, height '5\' 9"'  (1.753 m), weight 70.3 kg (155 lb), SpO2 92 %. Physical Exam  Vitals reviewed. Constitutional: He is oriented to person, place, and time. He appears well-developed and well-nourished.  Non-toxic appearance. He does not have a sickly appearance. No distress.  HENT:  Head: Normocephalic and atraumatic.  Right Ear: External ear normal.  Left Ear: External ear normal.  Eyes: Conjunctivae are normal. No scleral icterus.  Neck: Normal range of motion. Neck supple. No tracheal deviation present. No thyromegaly present.  Cardiovascular: Normal rate and normal heart sounds.   Respiratory: Effort normal and breath sounds normal. No stridor. No respiratory distress. He has no wheezes.  GI: There is tenderness in the right upper quadrant. There is no rigidity and no guarding.    Small midline incision; some RUQ TTP. No guarding/rebound  Musculoskeletal: He exhibits no edema or tenderness.  Lymphadenopathy:    He has no cervical adenopathy.  Neurological: He is alert and oriented to person, place, and time. He exhibits normal muscle tone.  Skin: Skin is warm and dry. No rash noted. He is not diaphoretic. No erythema. No pallor.  Psychiatric: He has a normal mood and affect. His behavior is normal. Judgment and thought content normal.    Assessment/Plan: Acute calculous cholecystitis with abscess COPD Osteoporosis  Npo p mn except essential meds Check coags in AM; repeat  cbc/cmet Discussed cholecystitis with pt and family.  His 'perforation' doesn't look that bad on CT. He is nontoxic. No tachycardia. Reassuring abd exam therefore doesn't need emergent surgery.   With respect to his cholecystitis with abscess, normally perc cholecystostomy tube would be the preferred option but in looking at CT most of inflammation is at fundus. Infundibulum/bile duct doesn't look that inflammed. May be best to do cholecystectomy.   Will defer to Dr Rosendo Gros  In interim, cont IV abx  Definitive plan in AM   Leighton Ruff.  Redmond Pulling, MD, FACS General, Bariatric, & Minimally Invasive Surgery [facility] Surgery, [place]   [facility] M 09/05/2016, 10:21 PM

## 2016-09-05 NOTE — ED Provider Notes (Signed)
[facility] DEPT Provider Note   CSN: [id number] Arrival date & time: 09/05/16  1630     History   Chief Complaint Chief Complaint  Patient presents with  . Abdominal Pain  . Vomiting    HPI Lonnie Schaefer is a 76 y.o. male.  HPI Patient is a 76 year old male presents emergency department with foreign half days of worsening right upper quadrant and upper abdominal pain.  Denies fever.  Reports nausea and some vomiting.  He's also had diarrhea today as well.  He has a history of intestinal volvulus status post surgery as well as a history of incisional hernia.  His had multiple small bowel obstructions and is concerned this could represent another small bowel obstruction.  No fever.  Family reports no altered mental status.  Pain is moderate in severity and worse with palpation of his right upper quadrant.   Past Medical History:  Diagnosis Date  . Bowel obstruction ([place]) 06/2009-10/01/2013 X 5; 04/03/2016  . COPD (chronic obstructive pulmonary disease) ([place])   . H/O hiatal hernia   . History of stomach ulcers ~ 1960  . On home oxygen therapy    "2L q hs" (04/03/2016)  . Pneumonia 1990s X 1  . Shortness of breath     Patient Active Problem List   Diagnosis Date Noted  . Leukocytosis 04/03/2016  . SBO (small bowel obstruction) ([place]) 04/03/2016  . Hyperbilirubinemia 04/03/2016  . AKI (acute kidney injury) ([place]) 04/03/2016  . Nausea, vomiting and diarrhea 10/01/2013  . Small bowel obstruction ([place]) 12/04/2012  . COPD (chronic obstructive pulmonary disease) ([place]) 02/23/2007    Past Surgical History:  Procedure Laterality Date  . ABDOMINAL EXPLORATION SURGERY  06/2009; 10/2009  . APPENDECTOMY  10/2009  . HERNIA REPAIR     "naval"  . 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 UMBILICAL HERNIA REPAIR  06/2009   w/mesh       Home Medications    Prior to Admission medications   Medication Sig Start Date End Date Taking? Authorizing Provider    albuterol (PROVENTIL HFA;VENTOLIN HFA) 108 (90 BASE) MCG/ACT inhaler Inhale 1 puff into the lungs every 6 (six) hours as needed for wheezing.    Yes [provider]  alendronate (FOSAMAX) 70 MG tablet Take 70 mg by mouth every Wednesday. 03/25/16  Yes [provider]  aspirin 81 MG chewable tablet Chew 81 mg by mouth daily.   Yes [provider]  clobetasol ointment (TEMOVATE) 3.81 % Apply 1 application topically 2 (two) times daily as needed (rash).   Yes [provider]  mupirocin cream (BACTROBAN) 2 % Apply topically 2 (two) times daily. Apply to rash on the back Patient taking differently: Apply 1 application topically 2 (two) times daily as needed. Apply to rash on the back 04/11/16  Yes Bonnielee Haff, MD  OXYGEN-HELIUM IN Inhale 2 each into the lungs See admin instructions. Inhale 2 liters at bedtime   Yes [provider]  tiotropium (SPIRIVA) 18 MCG inhalation capsule Place 18 mcg into inhaler and inhale daily.   Yes [provider]  ondansetron (ZOFRAN) 4 MG tablet Take 1 tablet (4 mg total) by mouth every 6 (six) hours. Patient not taking: Reported on 09/05/2016 06/06/16   Nat Christen, MD  oxyCODONE-acetaminophen (PERCOCET) 5-325 MG tablet Take 1-2 tablets by mouth every 4 (four) hours as needed. Patient not taking: Reported on 09/05/2016 06/06/16   Nat Christen, MD  traMADol Veatrice [place]bon) 50 MG  tablet Take 1 tablet (50 mg total) by mouth every 12 (twelve) hours as needed for moderate pain. Patient not taking: Reported on 09/05/2016 04/11/16   Bonnielee Haff, MD    Family History History reviewed. No pertinent family history.  Social History Social History  Substance Use Topics  . Smoking status: Former Smoker    Packs/day: 2.00    Years: 50.00    Types: Cigarettes    Quit date: 10/15/2001  . Smokeless tobacco: Never Used  . Alcohol use Yes     Comment: 04/03/2016 "quit drinking 01/1987"     Allergies   Patient has no known  allergies.   Review of Systems Review of Systems  All other systems reviewed and are negative.    Physical Exam Updated Vital Signs BP 128/70   Pulse 86   Temp (!) 97.5 F (36.4 C) (Oral)   Resp (!) 30   Ht 5\' 9"  (1.753 m)   Wt 70.3 kg (155 lb)   SpO2 94%   BMI 22.89 kg/m   Physical Exam  Constitutional: He is oriented to person, place, and time. He appears well-developed and well-nourished.  HENT:  Head: Normocephalic and atraumatic.  Eyes: EOM are normal.  Neck: Normal range of motion.  Cardiovascular: Normal rate, regular rhythm, normal heart sounds and intact distal pulses.   Pulmonary/Chest: Effort normal and breath sounds normal. No respiratory distress.  Abdominal: Soft. He exhibits no distension.  Right upper quadrant tenderness.  Guarding present.  No peritoneal signs  Musculoskeletal: Normal range of motion.  Neurological: He is alert and oriented to person, place, and time.  Skin: Skin is warm and dry.  Psychiatric: He has a normal mood and affect. Judgment normal.  Nursing note and vitals reviewed.    ED Treatments / Results  Labs (all labs ordered are listed, but only abnormal results are displayed) Labs Reviewed  COMPREHENSIVE METABOLIC PANEL - Abnormal; Notable for the following:       Result Value   Chloride 100 (*)    Glucose, Bld 118 (*)    Albumin 3.3 (*)    ALT 13 (*)    Total Bilirubin 1.7 (*)    All other components within normal limits  URINALYSIS, ROUTINE W REFLEX MICROSCOPIC - Abnormal; Notable for the following:    Color, Urine AMBER (*)    Ketones, ur 5 (*)    Protein, ur 30 (*)    All other components within normal limits  LIPASE, BLOOD  CBC    EKG  EKG Interpretation None       Radiology Ct Abdomen Pelvis W Contrast  Result Date: 09/05/2016 CLINICAL DATA:  76 year old male with right upper quadrant abdominal pain. EXAM: CT ABDOMEN AND PELVIS WITH CONTRAST TECHNIQUE: Multidetector CT imaging of the abdomen and pelvis  was performed using the standard protocol following bolus administration of intravenous contrast. CONTRAST:  ISOVUE-300 IOPAMIDOL (ISOVUE-300) INJECTION 61% COMPARISON:  Abdominal CT dated 06/06/2016 FINDINGS: Lower chest: There is mild elevation of the right hemidiaphragm with right lung base subsegmental atelectatic changes. There is advanced coronary vascular calcification involving the LAD. No intra-abdominal free air or free fluid. Hepatobiliary: The liver is unremarkable. No intrahepatic biliary ductal dilatation. The gallbladder is mildly distended. There is diffuse enhancement of the gallbladder mucosa. There is a linear septation within the body/fundus of the gallbladder with focal area of defect through the septation. Although this may represent a gallbladder fold findings concerning for focal or rupture of the gallbladder fundus. There is a  1.4 x 3.4 cm loculated collection contiguous with the lumen of the gallbladder. Small amount of loculated fluid measuring 9 x 18 x 34 mm extends inferiorly along the lateral peritoneal wall. Findings most consistent with perforated gallbladder likely secondary to gangrenous cholecystitis. Further evaluation with ultrasound recommended. Pancreas: Multiple punctate calcific foci in the pancreas likely sequela of prior pancreatitis. The pancreas is otherwise unremarkable. No peripancreatic inflammatory changes or dilatation of the main pancreatic duct. Spleen: Normal in size without focal abnormality. Adrenals/Urinary Tract: The adrenal glands are unremarkable. There is a small left renal inferior pole cyst. Probable nonobstructing renal calculi measuring 6 mm in the inferior pole of the left kidney and 2 mm in the inferior pole of the right kidney. A 6 mm high attenuating surface in the superior pole of the left kidney may represent a nonobstructing stone versus excreted contrast. There is no hydronephrosis on either side. There is symmetric enhancement and excretion of  contrast by both kidneys. The visualized ureters and urinary bladder appear unremarkable. Stomach/Bowel: There is extensive sigmoid diverticulosis with scattered colonic diverticula without active inflammatory changes. There is no evidence of bowel obstruction. Appendectomy. Vascular/Lymphatic: There is advanced aortoiliac atherosclerotic disease. The origins of the celiac axis, SMA, IMA and the renal arteries are patent. The SMV, splenic vein, and main portal vein are patent. No portal venous gas identified. There is no adenopathy. Reproductive: The prostate and seminal vesicles are grossly unremarkable. Other: There is a small fat containing umbilical hernia. There is slight protrusion of the anterior wall of a loop of small bowel into the hernia. No associated inflammatory changes. A smaller fat containing supraumbilical hernia is also noted. Musculoskeletal: Degenerative changes of the spine at L5-S1. No acute osseous pathology. IMPRESSION: 1. Perforated gallbladder with a loculated fluid adjacent the gallbladder fundus likely secondary to gangrenous cholecystitis. Further evaluation with ultrasound recommended. 2. Colonic diverticulosis.  No bowel obstruction. 3.  Aortic Atherosclerosis (ICD10-I70.0). Electronically Signed   By: Anner Crete M.D.   On: 09/05/2016 20:00    Procedures Procedures (including critical care time)  Medications Ordered in ED Medications  morphine 4 MG/ML injection 4 mg (4 mg Intravenous Given 09/05/16 1909)  sodium chloride 0.9 % bolus 1,000 mL (1,000 mLs Intravenous New Bag/Given 09/05/16 1910)    Followed by  0.9 %  sodium chloride infusion (not administered)  piperacillin-tazobactam (ZOSYN) IVPB 3.375 g (not administered)  iopamidol (ISOVUE-300) 61 % injection (100 mLs  Contrast Given 09/05/16 1921)  ondansetron (ZOFRAN) injection 4 mg (4 mg Intravenous Given 09/05/16 1909)     Initial Impression / Assessment and Plan / ED Course  I have reviewed the triage vital  signs and the nursing notes.  Pertinent labs & imaging results that were available during my care of the patient were reviewed by me and considered in my medical decision making (see chart for details).    CT scan concerning for cholecystitis with perforated gallbladder with small area of loculated fluid.  IV Zosyn now.  Nontoxic.  Hemodynamically stable.  Given his age and multiple comorbidities he may benefit from IR drainage   8:37 PM Spoke with Dr Greer Pickerel, general surgery, Who will see in consultation.  Hospitalist admission  Final Clinical Impressions(s) / ED Diagnoses   Final diagnoses:  Acute cholecystitis    New Prescriptions New Prescriptions   No medications on file     Jola Schmidt, MD 09/05/16 2039

## 2016-09-05 NOTE — ED Notes (Signed)
Patient transported to CT 

## 2016-09-06 ENCOUNTER — Encounter (HOSPITAL_COMMUNITY)
Admission: EM | Disposition: A | Discharge status: Home / Self Care | Payer: Self-pay | Source: Home / Self Care | Attending: Family Medicine

## 2016-09-06 ENCOUNTER — Inpatient Hospital Stay (HOSPITAL_COMMUNITY): Payer: Medicare Other

## 2016-09-06 ENCOUNTER — Encounter (HOSPITAL_COMMUNITY): Payer: Self-pay

## 2016-09-06 ENCOUNTER — Inpatient Hospital Stay (HOSPITAL_COMMUNITY): Payer: Medicare Other | Admitting: Anesthesiology

## 2016-09-06 HISTORY — PX: CHOLECYSTECTOMY: SHX55

## 2016-09-06 LAB — BASIC METABOLIC PANEL
Anion gap: 5 (ref 5–15)
BUN: 9 mg/dL (ref 6–20)
CO2: 27 mmol/L (ref 22–32)
Calcium: 7.7 mg/dL — ABNORMAL LOW (ref 8.9–10.3)
Chloride: 109 mmol/L (ref 101–111)
Creatinine, Ser: 1.04 mg/dL (ref 0.61–1.24)
GFR calc Af Amer: >60 mL/min (ref >60)
GFR calc non Af Amer: >60 mL/min (ref >60)
Glucose, Bld: 95 mg/dL (ref 65–99)
Potassium: 4.3 mmol/L (ref 3.5–5.1)
Sodium: 141 mmol/L (ref 135–145)

## 2016-09-06 LAB — CBC
HCT: 35.6 % — ABNORMAL LOW (ref 39.0–52.0)
Hemoglobin: 11.3 g/dL — ABNORMAL LOW (ref 13.0–17.0)
MCH: 29.2 pg (ref 26.0–34.0)
MCHC: 31.7 g/dL (ref 30.0–36.0)
MCV: 92 fL (ref 78.0–100.0)
Platelets: 235 10*3/uL (ref 150–400)
RBC: 3.87 MIL/uL — ABNORMAL LOW (ref 4.22–5.81)
RDW: 13.5 % (ref 11.5–15.5)
WBC: 5.8 10*3/uL (ref 4.0–10.5)

## 2016-09-06 SURGERY — LAPAROSCOPIC CHOLECYSTECTOMY WITH INTRAOPERATIVE CHOLANGIOGRAM
Anesthesia: General | Site: Abdomen

## 2016-09-06 MED ORDER — ALBUTEROL SULFATE (2.5 MG/3ML) 0.083% IN NEBU: 2.5000 mg | INHALATION_SOLUTION | RESPIRATORY_TRACT | Status: DC | PRN

## 2016-09-06 MED ORDER — 0.9 % SODIUM CHLORIDE (POUR BTL) OPTIME
TOPICAL | Status: DC | PRN
  Administered 2016-09-06: 1000 mL

## 2016-09-06 MED ORDER — ONDANSETRON HCL 4 MG/2ML IJ SOLN
4.0000 mg | Freq: Four times a day (QID) | INTRAMUSCULAR | Status: DC | PRN
Start: 2016-09-06 — End: 2016-09-08
  Administered 2016-09-07 – 2016-09-08 (×3): 4 mg via INTRAVENOUS
  Filled 2016-09-06 (×3): qty 2

## 2016-09-06 MED ORDER — MORPHINE SULFATE (PF) 4 MG/ML IV SOLN
2.0000 mg | INTRAVENOUS | Status: DC | PRN
  Administered 2016-09-06 – 2016-09-07 (×3): 2 mg via INTRAVENOUS
  Filled 2016-09-06 (×3): qty 1

## 2016-09-06 MED ORDER — OXYCODONE HCL 5 MG/5ML PO SOLN: 5.0000 mg | Freq: Once | ORAL | Status: DC | PRN

## 2016-09-06 MED ORDER — TIOTROPIUM BROMIDE MONOHYDRATE 18 MCG IN CAPS
18.0000 ug | ORAL_CAPSULE | Freq: Every day | RESPIRATORY_TRACT | Status: DC
  Administered 2016-09-06 – 2016-09-08 (×3): 18 ug via RESPIRATORY_TRACT
  Filled 2016-09-06: qty 5

## 2016-09-06 MED ORDER — SUGAMMADEX SODIUM 200 MG/2ML IV SOLN
INTRAVENOUS | Status: DC | PRN
  Administered 2016-09-06: 200 mg via INTRAVENOUS

## 2016-09-06 MED ORDER — IOPAMIDOL (ISOVUE-300) INJECTION 61%
INTRAVENOUS | Status: DC | PRN
  Administered 2016-09-06: 7 mL

## 2016-09-06 MED ORDER — ACETAMINOPHEN 325 MG PO TABS
650.0000 mg | ORAL_TABLET | Freq: Four times a day (QID) | ORAL | Status: DC
  Administered 2016-09-06 – 2016-09-08 (×8): 650 mg via ORAL
  Filled 2016-09-06 (×8): qty 2

## 2016-09-06 MED ORDER — SUCCINYLCHOLINE CHLORIDE 20 MG/ML IJ SOLN
INTRAMUSCULAR | Status: DC | PRN
  Administered 2016-09-06: 100 mg via INTRAVENOUS

## 2016-09-06 MED ORDER — FENTANYL CITRATE (PF) 250 MCG/5ML IJ SOLN
INTRAMUSCULAR | Status: AC
  Filled 2016-09-06: qty 5

## 2016-09-06 MED ORDER — FENTANYL CITRATE (PF) 100 MCG/2ML IJ SOLN
INTRAMUSCULAR | Status: AC
Start: 2016-09-06 — End: 2016-09-07
  Filled 2016-09-06: qty 2

## 2016-09-06 MED ORDER — ASPIRIN 81 MG PO CHEW
81.0000 mg | CHEWABLE_TABLET | Freq: Every day | ORAL | Status: DC
  Administered 2016-09-07 – 2016-09-08 (×2): 81 mg via ORAL
  Filled 2016-09-06 (×2): qty 1

## 2016-09-06 MED ORDER — SUGAMMADEX SODIUM 200 MG/2ML IV SOLN
INTRAVENOUS | Status: AC
  Filled 2016-09-06: qty 6

## 2016-09-06 MED ORDER — ALBUTEROL SULFATE HFA 108 (90 BASE) MCG/ACT IN AERS
INHALATION_SPRAY | RESPIRATORY_TRACT | Status: DC | PRN
  Administered 2016-09-06: 2 via RESPIRATORY_TRACT

## 2016-09-06 MED ORDER — OXYCODONE HCL 5 MG PO TABS
5.0000 mg | ORAL_TABLET | ORAL | Status: DC | PRN
  Administered 2016-09-06 – 2016-09-07 (×3): 5 mg via ORAL
  Filled 2016-09-06 (×4): qty 1

## 2016-09-06 MED ORDER — ALENDRONATE SODIUM 70 MG PO TABS: 70.0000 mg | ORAL_TABLET | ORAL | Status: DC

## 2016-09-06 MED ORDER — PROPOFOL 10 MG/ML IV BOLUS
INTRAVENOUS | Status: AC
  Filled 2016-09-06: qty 20

## 2016-09-06 MED ORDER — OXYCODONE HCL 5 MG PO TABS: 5.0000 mg | ORAL_TABLET | Freq: Once | ORAL | Status: DC | PRN

## 2016-09-06 MED ORDER — PROPOFOL 10 MG/ML IV BOLUS
INTRAVENOUS | Status: DC | PRN
  Administered 2016-09-06: 100 mg via INTRAVENOUS
  Administered 2016-09-06: 20 mg via INTRAVENOUS

## 2016-09-06 MED ORDER — ROCURONIUM BROMIDE 100 MG/10ML IV SOLN
INTRAVENOUS | Status: DC | PRN
  Administered 2016-09-06 (×2): 10 mg via INTRAVENOUS
  Administered 2016-09-06: 30 mg via INTRAVENOUS

## 2016-09-06 MED ORDER — FENTANYL CITRATE (PF) 100 MCG/2ML IJ SOLN
INTRAMUSCULAR | Status: DC | PRN
  Administered 2016-09-06 (×4): 50 ug via INTRAVENOUS

## 2016-09-06 MED ORDER — LIDOCAINE HCL (CARDIAC) 20 MG/ML IV SOLN
INTRAVENOUS | Status: DC | PRN
  Administered 2016-09-06: 60 mg via INTRAVENOUS

## 2016-09-06 MED ORDER — ONDANSETRON HCL 4 MG PO TABS: 4.0000 mg | ORAL_TABLET | Freq: Four times a day (QID) | ORAL | Status: DC | PRN

## 2016-09-06 MED ORDER — PHENYLEPHRINE 40 MCG/ML (10ML) SYRINGE FOR IV PUSH (FOR BLOOD PRESSURE SUPPORT)
PREFILLED_SYRINGE | INTRAVENOUS | Status: AC
  Filled 2016-09-06: qty 10

## 2016-09-06 MED ORDER — OXYCODONE HCL 5 MG PO TABS
5.0000 mg | ORAL_TABLET | Freq: Once | ORAL | Status: AC
  Administered 2016-09-07: 5 mg via ORAL
  Filled 2016-09-06: qty 1

## 2016-09-06 MED ORDER — SODIUM CHLORIDE 0.9 % IR SOLN
Status: DC | PRN
  Administered 2016-09-06: 1000 mL

## 2016-09-06 MED ORDER — PHENYLEPHRINE HCL 10 MG/ML IJ SOLN
INTRAMUSCULAR | Status: DC | PRN
  Administered 2016-09-06: 20 ug/min via INTRAVENOUS

## 2016-09-06 MED ORDER — PIPERACILLIN-TAZOBACTAM 3.375 G IVPB
3.3750 g | Freq: Three times a day (TID) | INTRAVENOUS | Status: AC
  Administered 2016-09-06 – 2016-09-07 (×3): 3.375 g via INTRAVENOUS
  Filled 2016-09-06 (×3): qty 50

## 2016-09-06 MED ORDER — BUPIVACAINE HCL 0.25 % IJ SOLN
INTRAMUSCULAR | Status: DC | PRN
  Administered 2016-09-06: 4 mL

## 2016-09-06 MED ORDER — PIPERACILLIN-TAZOBACTAM 3.375 G IVPB
3.3750 g | Freq: Three times a day (TID) | INTRAVENOUS | Status: DC
  Administered 2016-09-06: 3.375 g via INTRAVENOUS
  Filled 2016-09-06 (×2): qty 50

## 2016-09-06 MED ORDER — FENTANYL CITRATE (PF) 100 MCG/2ML IJ SOLN
25.0000 ug | INTRAMUSCULAR | Status: DC | PRN
  Administered 2016-09-06 (×2): 50 ug via INTRAVENOUS

## 2016-09-06 MED ORDER — LACTATED RINGERS IV SOLN
INTRAVENOUS | Status: DC | PRN
  Administered 2016-09-06 (×2): via INTRAVENOUS

## 2016-09-06 SURGICAL SUPPLY — 42 items
APPLIER CLIP 5 13 M/L LIGAMAX5 (MISCELLANEOUS) ×2
BENZOIN TINCTURE PRP APPL 2/3 (GAUZE/BANDAGES/DRESSINGS) ×2 IMPLANT
CANISTER SUCT 3000ML PPV (MISCELLANEOUS) ×2 IMPLANT
CHLORAPREP W/TINT 26ML (MISCELLANEOUS) ×2 IMPLANT
CLIP APPLIE 5 13 M/L LIGAMAX5 (MISCELLANEOUS) ×1 IMPLANT
CLIP VESOLOCK MED LG 6/CT (CLIP) ×2 IMPLANT
COVER MAYO STAND STRL (DRAPES) ×2 IMPLANT
COVER SURGICAL LIGHT HANDLE (MISCELLANEOUS) ×2 IMPLANT
COVER TRANSDUCER ULTRASND (DRAPES) IMPLANT
DISSECTOR BLUNT TIP ENDO 5MM (MISCELLANEOUS) ×2 IMPLANT
DRAPE C-ARM 42X72 X-RAY (DRAPES) ×2 IMPLANT
ELECT REM PT RETURN 9FT ADLT (ELECTROSURGICAL) ×2
ELECTRODE REM PT RTRN 9FT ADLT (ELECTROSURGICAL) ×1 IMPLANT
GAUZE SPONGE 2X2 8PLY STRL LF (GAUZE/BANDAGES/DRESSINGS) ×1 IMPLANT
GLOVE BIO SURGEON STRL SZ7.5 (GLOVE) ×2 IMPLANT
GOWN STRL REUS W/ TWL LRG LVL3 (GOWN DISPOSABLE) ×2 IMPLANT
GOWN STRL REUS W/ TWL XL LVL3 (GOWN DISPOSABLE) ×1 IMPLANT
GOWN STRL REUS W/TWL LRG LVL3 (GOWN DISPOSABLE) ×2
GOWN STRL REUS W/TWL XL LVL3 (GOWN DISPOSABLE) ×1
GRASPER SUT TROCAR 14GX15 (MISCELLANEOUS) ×2 IMPLANT
IV CATH 14GX2 1/4 (CATHETERS) ×4 IMPLANT
KIT BASIN OR (CUSTOM PROCEDURE TRAY) ×2 IMPLANT
KIT ROOM TURNOVER OR (KITS) ×2 IMPLANT
NEEDLE INSUFFLATION 14GA 120MM (NEEDLE) ×2 IMPLANT
NS IRRIG 1000ML POUR BTL (IV SOLUTION) ×2 IMPLANT
PAD ARMBOARD 7.5X6 YLW CONV (MISCELLANEOUS) ×4 IMPLANT
POUCH SPECIMEN RETRIEVAL 10MM (ENDOMECHANICALS) IMPLANT
SCISSORS LAP 5X35 DISP (ENDOMECHANICALS) ×2 IMPLANT
SET CHOLANGIOGRAPHY FRANKLIN (SET/KITS/TRAYS/PACK) ×2 IMPLANT
SET IRRIG TUBING LAPAROSCOPIC (IRRIGATION / IRRIGATOR) ×2 IMPLANT
SLEEVE ENDOPATH XCEL 5M (ENDOMECHANICALS) ×2 IMPLANT
SPECIMEN JAR SMALL (MISCELLANEOUS) ×2 IMPLANT
SPONGE GAUZE 2X2 STER 10/PKG (GAUZE/BANDAGES/DRESSINGS) ×1
STOPCOCK 4 WAY LG BORE MALE ST (IV SETS) ×2 IMPLANT
STRIP CLOSURE SKIN 1/2X4 (GAUZE/BANDAGES/DRESSINGS) ×2 IMPLANT
SUT MNCRL AB 3-0 PS2 18 (SUTURE) ×2 IMPLANT
TOWEL OR 17X24 6PK STRL BLUE (TOWEL DISPOSABLE) ×2 IMPLANT
TOWEL OR 17X26 10 PK STRL BLUE (TOWEL DISPOSABLE) ×2 IMPLANT
TRAY LAPAROSCOPIC MC (CUSTOM PROCEDURE TRAY) ×2 IMPLANT
TROCAR XCEL NON-BLD 11X100MML (ENDOMECHANICALS) ×2 IMPLANT
TROCAR XCEL NON-BLD 5MMX100MML (ENDOMECHANICALS) ×2 IMPLANT
TUBING INSUFFLATION (TUBING) ×2 IMPLANT

## 2016-09-06 NOTE — Progress Notes (Signed)
Family Medicine Teaching Service Daily Progress Note Intern Pager: [phone]  Patient name: Lonnie Schaefer Medical record number: [id number] Date of birth: 1940/03/31 Age: 76 y.o. Gender: male  Primary Care Provider: [name], FNP Consultants: GI Code Status: FULL  Pt Overview and Major Events to Date:  CT showed perferated appendix  Assessment and Plan: Lonnie Schaefer is a 76 y.o. male presenting with nausea, vomiting, abdominal pain . PMH is significant for multiple small bowel obstructions, COPD, chronic respiratory failure, osteoporosis  N/V/abdominal pain due to cholecystitis: Vitals within normal limits on admission. Physical exam showing diffuse abdominal tenderness, mostly in the right upper quadrant, hyperactive bowel sounds. Electrolytes within normal limits. Creatinine normal at 1.03. LFTs not elevated with a AST of 18 and a PLT of 13. Slightly elevated T bili of 1.7. No leukocytosis and CBC unremarkable. UA showing 5 ketones and 30 protein, no nitrites or leukocytes. CT abdomen showing Perforated gallbladder with a loculated fluid adjacent the gallbladder fundus likely secondary to gangrenous cholecystitis and colonic diverticulosis. No bowel obstruction.  -Admit to family medicine teaching service, MedSurg, attending Dr. Erin Hearing -Vital signs per floor protocol -Surgery consulted, appreciate assistance -Nothing by mouth -Morphine when necessary for pain -Zofran when necessary for nausea -Continue Zosyn  -maintenance IV fluids -We will obtain baseline EKG -Consider ordering PT/OT after surgery  COPD with chronic respiratory failure: Home medications include Spiriva and albuterol when necessary. Uses 2 L of oxygen at night. Respiratory exam largely normal with exception of faint wheezing bilaterally. No signs of hypoxia. Quit smoking in 2001. -Continue home Spiriva -Albuterol when necessary -Pulse ox with vitals -2 L oxygen at night  History of  osteoporosis -Fosfomax d/c during stay per pharmacy   FEN/GI: nothing by mouth, maintenance IV fluids Prophylaxis: SCDs  Disposition: Admit to telemetry, MedSurg, attending Dr. Erin Hearing  Subjective:  Patient well appearing and mildly uncomfrotable during interview, was in agreement with planned surgery and in good spirits  Objective: Temp:  [97.5 F (36.4 C)-98 F (36.7 C)] 97.7 F (36.5 C) (08/03 0523) Pulse Rate:  [68-104] 68 (08/03 0523) Resp:  [16-30] 18 (08/03 0523) BP: (103-134)/(67-83) 120/69 (08/03 0523) SpO2:  [92 %-98 %] 94 % (08/03 0523) Weight:  [152 lb 8.9 oz (69.2 kg)-155 lb (70.3 kg)] 152 lb 8.9 oz (69.2 kg) (08/03 0020) Physical Exam: General: well appearing, slightly uncomfortable Cardiovascular: RRR, no murmurs noted Respiratory: lungs CTA, no wheezes/crackles noted Abdomen: pain localized to RUQ, no rebound tenderness, belly still soft to palpation Extremities: patient ambulating freely, no edema/wounding noted  Laboratory:  Recent Labs Lab 09/05/16 1640 09/06/16 0501  WBC 9.4 5.8  HGB 14.1 11.3*  HCT 41.4 35.6*  PLT 292 235    Recent Labs Lab 09/05/16 1640 09/06/16 0501  NA 137 141  K 3.8 4.3  CL 100* 109  CO2 27 27  BUN 10 9  CREATININE 1.03 1.04  CALCIUM 9.1 7.7*  PROT 7.2  --   BILITOT 1.7*  --   ALKPHOS 57  --   ALT 13*  --   AST 18  --   GLUCOSE 118* 95      Imaging/Diagnostic Tests: Ct Abdomen Pelvis W Contrast  Result Date: 09/05/2016 CLINICAL DATA:  76 year old male with right upper quadrant abdominal pain. EXAM: CT ABDOMEN AND PELVIS WITH CONTRAST TECHNIQUE: Multidetector CT imaging of the abdomen and pelvis was performed using the standard protocol following bolus administration of intravenous contrast. CONTRAST:  ISOVUE-300 IOPAMIDOL (ISOVUE-300) INJECTION 61% COMPARISON:  Abdominal CT  dated 06/06/2016 FINDINGS: Lower chest: There is mild elevation of the right hemidiaphragm with right lung base subsegmental atelectatic  changes. There is advanced coronary vascular calcification involving the LAD. No intra-abdominal free air or free fluid. Hepatobiliary: The liver is unremarkable. No intrahepatic biliary ductal dilatation. The gallbladder is mildly distended. There is diffuse enhancement of the gallbladder mucosa. There is a linear septation within the body/fundus of the gallbladder with focal area of defect through the septation. Although this may represent a gallbladder fold findings concerning for focal or rupture of the gallbladder fundus. There is a 1.4 x 3.4 cm loculated collection contiguous with the lumen of the gallbladder. Small amount of loculated fluid measuring 9 x 18 x 34 mm extends inferiorly along the lateral peritoneal wall. Findings most consistent with perforated gallbladder likely secondary to gangrenous cholecystitis. Further evaluation with ultrasound recommended. Pancreas: Multiple punctate calcific foci in the pancreas likely sequela of prior pancreatitis. The pancreas is otherwise unremarkable. No peripancreatic inflammatory changes or dilatation of the main pancreatic duct. Spleen: Normal in size without focal abnormality. Adrenals/Urinary Tract: The adrenal glands are unremarkable. There is a small left renal inferior pole cyst. Probable nonobstructing renal calculi measuring 6 mm in the inferior pole of the left kidney and 2 mm in the inferior pole of the right kidney. A 6 mm high attenuating surface in the superior pole of the left kidney may represent a nonobstructing stone versus excreted contrast. There is no hydronephrosis on either side. There is symmetric enhancement and excretion of contrast by both kidneys. The visualized ureters and urinary bladder appear unremarkable. Stomach/Bowel: There is extensive sigmoid diverticulosis with scattered colonic diverticula without active inflammatory changes. There is no evidence of bowel obstruction. Appendectomy. Vascular/Lymphatic: There is advanced  aortoiliac atherosclerotic disease. The origins of the celiac axis, SMA, IMA and the renal arteries are patent. The SMV, splenic vein, and main portal vein are patent. No portal venous gas identified. There is no adenopathy. Reproductive: The prostate and seminal vesicles are grossly unremarkable. Other: There is a small fat containing umbilical hernia. There is slight protrusion of the anterior wall of a loop of small bowel into the hernia. No associated inflammatory changes. A smaller fat containing supraumbilical hernia is also noted. Musculoskeletal: Degenerative changes of the spine at L5-S1. No acute osseous pathology. IMPRESSION: 1. Perforated gallbladder with a loculated fluid adjacent the gallbladder fundus likely secondary to gangrenous cholecystitis. Further evaluation with ultrasound recommended. 2. Colonic diverticulosis.  No bowel obstruction. 3.  Aortic Atherosclerosis (ICD10-I70.0). Electronically Signed   By: Anner Crete M.D.   On: 09/05/2016 20:00     Sherene Sires, DO 09/06/2016, 7:04 AM PGY-1, [place] Intern pager: [phone], text pages welcome

## 2016-09-06 NOTE — Progress Notes (Signed)

## 2016-09-06 NOTE — Anesthesia Procedure Notes (Signed)
Procedure Name: Intubation Date/Time: 09/06/2016 12:40 PM Performed by: Lavell Luster Pre-anesthesia Checklist: Patient identified, Emergency Drugs available, Suction available, Patient being monitored and Timeout performed Patient Re-evaluated:Patient Re-evaluated prior to induction Oxygen Delivery Method: Circle system utilized Preoxygenation: Pre-oxygenation with 100% oxygen Induction Type: IV induction Ventilation: Mask ventilation without difficulty Laryngoscope Size: Mac and 3 Grade View: Grade I Tube type: Oral Tube size: 7.5 mm Number of attempts: 1 Airway Equipment and Method: Stylet Placement Confirmation: ETT inserted through vocal cords under direct vision,  positive ETCO2 and breath sounds checked- equal and bilateral Secured at: 20 cm Tube secured with: Tape Dental Injury: Teeth and Oropharynx as per pre-operative assessment

## 2016-09-06 NOTE — Discharge Instructions (Signed)
please arrive at least 30 min before your appointment to complete your check in paperwork.  If you are unable to arrive 30 min prior to your appointment time we may have to cancel or reschedule you. ° °LAPAROSCOPIC SURGERY: POST OP INSTRUCTIONS  °1. DIET: Follow a light bland diet the first 24 hours after arrival home, such as soup, liquids, crackers, etc. Be sure to include lots of fluids daily. Avoid fast food or heavy meals as your are more likely to get nauseated. Eat a low fat the next few days after surgery.  °2. Take your usually prescribed home medications unless otherwise directed. °3. PAIN CONTROL:  °1. Pain is best controlled by a usual combination of three different methods TOGETHER:  °1. Ice/Heat °2. Over the counter pain medication °3. Prescription pain medication °2. Most patients will experience some swelling and bruising around the incisions. Ice packs or heating pads (30-60 minutes up to 6 times a day) will help. Use ice for the first few days to help decrease swelling and bruising, then switch to heat to help relax tight/sore spots and speed recovery. Some people prefer to use ice alone, heat alone, alternating between ice & heat. Experiment to what works for you. Swelling and bruising can take several weeks to resolve.  °3. It is helpful to take an over-the-counter pain medication regularly for the first few weeks. Choose one of the following that works best for you:  °1. Naproxen (Aleve, etc) Two 220mg tabs twice a day °2. Ibuprofen (Advil, etc) Three 200mg tabs four times a day (every meal & bedtime) °3. Acetaminophen (Tylenol, etc) 500-650mg four times a day (every meal & bedtime) °4. A prescription for pain medication (such as oxycodone, hydrocodone, etc) should be given to you upon discharge. Take your pain medication as prescribed.  °1. If you are having problems/concerns with the prescription medicine (does not control pain, nausea, vomiting, rash, itching, etc), please call us [phone] to see if we need to switch you to a different pain medicine that will work better for you and/or control your side effect better. °2. If you need a refill on your pain medication, please contact your pharmacy. They will contact our office to request authorization. Prescriptions will not be filled after 5 pm or on week-ends. °4. Avoid getting constipated. Between the surgery and the pain medications, it is common to experience some constipation. Increasing fluid intake and taking a fiber supplement (such as Metamucil, Citrucel, FiberCon, MiraLax, etc) 1-2 times a day regularly will usually help prevent this problem from occurring. A mild laxative (prune juice, Milk of Magnesia, MiraLax, etc) should be taken according to package directions if there are no bowel movements after 48 hours.  °5. Watch out for diarrhea. If you have many loose bowel movements, simplify your diet to bland foods & liquids for a few days. Stop any stool softeners and decrease your fiber supplement. Switching to mild anti-diarrheal medications (Kayopectate, Pepto Bismol) can help. If this worsens or does not improve, please call us. °6. Wash / shower every day. You may shower over the dressings as they are waterproof. Continue to shower over incision(s) after the dressing is off. °7. Remove your waterproof bandages 5 days after surgery. You may leave the incision open to air. You may replace a dressing/Band-Aid to cover the incision for comfort if you wish.  °8. ACTIVITIES as tolerated:  °1. You may resume regular (light) daily activities beginning the next day--such as daily self-care, walking, climbing stairs--gradually   increasing activities as tolerated. If you can walk 30 minutes without difficulty, it is safe to try more intense activity such as jogging, treadmill, bicycling, low-impact aerobics, swimming, etc. °2. Save the most intensive and strenuous activity for last such as sit-ups, heavy lifting, contact sports, etc Refrain  from any heavy lifting or straining until you are off narcotics for pain control.  °3. DO NOT PUSH THROUGH PAIN. Let pain be your guide: If it hurts to do something, don't do it. Pain is your body warning you to avoid that activity for another week until the pain goes down. °4. You may drive when you are no longer taking prescription pain medication, you can comfortably wear a seatbelt, and you can safely maneuver your car and apply brakes. °5. You may have sexual intercourse when it is comfortable.  °9. FOLLOW UP in our office  °1. Please call [facility] at [phone] to set up an appointment to see your surgeon in the office for a follow-up appointment approximately 2-3 weeks after your surgery. °2. Make sure that you call for this appointment the day you arrive home to insure a convenient appointment time. °     10. IF YOU HAVE DISABILITY OR FAMILY LEAVE FORMS, BRING THEM TO THE               OFFICE FOR PROCESSING.  ° °WHEN TO CALL US [phone]:  °1. Poor pain control °2. Reactions / problems with new medications (rash/itching, nausea, etc)  °3. Fever over 101.5 F (38.5 C) °4. Inability to urinate °5. Nausea and/or vomiting °6. Worsening swelling or bruising °7. Continued bleeding from incision. °8. Increased pain, redness, or drainage from the incision ° °The clinic staff is available to answer your questions during regular business hours (8:30am-5pm). Please don’t hesitate to call and ask to speak to one of our nurses for clinical concerns.  °If you have a medical emergency, go to the nearest emergency room or call 911.  °A surgeon from [facility] is always on call at the hospitals  ° °[facility]  °[address], [address], [place], [place] [address] ?  °MAIN: [phone] ? TOLL FREE: [phone] ?  °FAX [phone]  °[email or web address] ° °

## 2016-09-06 NOTE — Op Note (Signed)
09/06/2016  2:20 PM  PATIENT:  Lonnie Schaefer  76 y.o. male  PRE-OPERATIVE DIAGNOSIS:  ACUTE CHOLECYSTITIS  POST-OPERATIVE DIAGNOSIS:  ACUTE, GANGRENOUS CHOLECYSTITIS  PROCEDURE:  Procedure(s): LAPAROSCOPIC CHOLECYSTECTOMY WITH  INTRAOPERATIVE CHOLANGIOGRAM (N/A)  SURGEON:  Surgeon(s) and Role:    Ralene Ok, MD - Primary  ANESTHESIA:   local and general  EBL:25CC   Total I/O In: 1000 [I.V.:1000] Out: -   BLOOD ADMINISTERED:none  DRAINS: none   LOCAL MEDICATIONS USED:  BUPIVICAINE   SPECIMEN:  Source of Specimen:  gallbladder  DISPOSITION OF SPECIMEN:  PATHOLOGY  COUNTS:  YES  TOURNIQUET:  * No tourniquets in log *  DICTATION: .Dragon Dictation  The patient was taken to the operating and placed in the supine position with bilateral SCDs in place. The patient was prepped and draped in the usual sterile fashion. A time out was called and all facts were verified. A pneumoperitoneum was obtained via A Veress needle technique to a pressure of 6mm of mercury. There were a large amount of midline adhesions.  A 39mm trocar was placed in the RLQ under direct visualization.  The adhesions were taken down with blunt and sharp dissection and consisted mainly of omentum.  Once an area was cleared around the umbilicus a 11 mm port was then placed in the umbilical region after infiltrating with local anesthesia under direct visualization. A third epigastric port  Was  placed under direct visualization.   The liver was very fatty and large. The gallbladder was identified  And seen to be gangrenous with rupture at the fundus apex.  It was grasped and retracted, the peritoneum was then sharply dissected from the gallbladder and this dissection was carried down to Calot's triangle. The gallbladder was identified and stripped away circumferentially and seen going into the gallbladder 360, the critical angle was obtained. A Cook catheter was used to perform an intraoperative  cholangiogram. The cholangiogram showed no filling defects and the dye easily went into the duodenum.  The hepatic ducts were seen to be free of defects. 2 clips were placed proximally one distally and the cystic duct transected. The cystic artery was identified and 2 clips placed proximally and one distally and transected. We then proceeded to remove the gallbladder off the hepatic fossa with Bovie cautery. A retrieval bag was then placed in the abdomen and gallbladder placed in the bag. The hepatic fossa was then reexamined and hemostasis was achieved with Bovie cautery and was excellent at the end of the case. The subhepatic fossa and perihepatic fossa was then irrigated until the effluent was clear. The 11 mm trocar fascia was reapproximated with the Endo Close #1 Vicryl. The pneumoperitoneum was evacuated and all trochars removed under direct visulalization. The skin was then closed with 4-0 Monocryl and the skin dressed with Steri-Strips, gauze, and tape. The patient was awaken from general anesthesia and taken to the recovery room in stable condition.   PLAN OF CARE: Admit to inpatient   PATIENT DISPOSITION:  PACU - hemodynamically stable.   Delay start of Pharmacological VTE agent (>24hrs) due to surgical blood loss or risk of bleeding: not applicable

## 2016-09-06 NOTE — Anesthesia Preprocedure Evaluation (Signed)
Anesthesia Evaluation  Patient identified by MRN, date of birth, ID band Patient awake    Reviewed: Allergy & Precautions, NPO status , Patient's Chart, lab work & pertinent test results  History of Anesthesia Complications Negative for: history of anesthetic complications  Airway Mallampati: II  TM Distance: >3 FB Neck ROM: Full    Dental  (+) Edentulous Lower, Edentulous Upper   Pulmonary shortness of breath and Long-Term Oxygen Therapy, COPD,  COPD inhaler and oxygen dependent, former smoker,    breath sounds clear to auscultation       Cardiovascular negative cardio ROS   Rhythm:Regular     Neuro/Psych negative neurological ROS  negative psych ROS   GI/Hepatic Neg liver ROS, hiatal hernia, ACUTE CHOLECYSTITIS   Endo/Other  negative endocrine ROS  Renal/GU ARFRenal disease     Musculoskeletal negative musculoskeletal ROS (+)   Abdominal   Peds  Hematology negative hematology ROS (+)   Anesthesia Other Findings   Reproductive/Obstetrics                             Anesthesia Physical Anesthesia Plan  ASA: III  Anesthesia Plan: General   Post-op Pain Management:    Induction: Intravenous  PONV Risk Score and Plan: 2 and Ondansetron and Dexamethasone  Airway Management Planned: Oral ETT  Additional Equipment: None  Intra-op Plan:   Post-operative Plan: Extubation in OR  Informed Consent: I have reviewed the patients History and Physical, chart, labs and discussed the procedure including the risks, benefits and alternatives for the proposed anesthesia with the patient or authorized representative who has indicated his/her understanding and acceptance.   Dental advisory given  Plan Discussed with: CRNA and Surgeon  Anesthesia Plan Comments:         Anesthesia Quick Evaluation

## 2016-09-06 NOTE — Transfer of Care (Signed)
Immediate Anesthesia Transfer of Care Note  Patient: Lonnie Schaefer  Procedure(s) Performed: Procedure(s): LAPAROSCOPIC CHOLECYSTECTOMY WITH  INTRAOPERATIVE CHOLANGIOGRAM (N/A)  Patient Location: PACU  Anesthesia Type:General  Level of Consciousness: awake, alert  and patient cooperative  Airway & Oxygen Therapy: Patient Spontanous Breathing and Patient connected to face mask oxygen  Post-op Assessment: Report given to RN and Post -op Vital signs reviewed and stable  Post vital signs: Reviewed and stable  Last Vitals:  Vitals:   09/06/16 0523 09/06/16 1434  BP: 120/69 (!) (P) 163/80  Pulse: 68   Resp: 18   Temp: 36.5 C (P) 36.5 C    Last Pain:  Vitals:   09/06/16 0758  TempSrc:   PainSc: 3          Complications: No apparent anesthesia complications

## 2016-09-06 NOTE — Progress Notes (Signed)
Pharmacy Antibiotic Note  Lonnie Schaefer is a 76 y.o. male admitted on 09/05/2016 with intra-abdominal infection.  Pharmacy has been consulted for Zosyn dosing. Patient received a one-time Zosyn dose at 2100 PM on 8/2.   Patient is currently afebrile. WBC is within normal limits. SCr is stable.   Plan: Zosyn 3.375g IV q8h (4 hour infusion).  Height: 5\' 11"  (180.3 cm) Weight: 152 lb 8.9 oz (69.2 kg) IBW/kg (Calculated) : 75.3  Temp (24hrs), Avg:97.8 F (36.6 C), Min:97.5 F (36.4 C), Max:98 F (36.7 C)   Recent Labs Lab 09/05/16 1640 09/06/16 0501  WBC 9.4 5.8  CREATININE 1.03 1.04    Estimated Creatinine Clearance: 61.9 mL/min (by C-G formula based on SCr of 1.04 mg/dL).    No Known Allergies  Antimicrobials this admission: Zosyn 8/2 >>  Dose adjustments this admission:  Microbiology results: none  Thank you for allowing pharmacy to be a part of this patient's care.  Sloan Leiter, PharmD, BCPS Clinical Pharmacist Clinical phone 09/06/2016 until 3:[phone] After hours, please call [phone] 09/06/2016 9:16 AM

## 2016-09-07 LAB — BASIC METABOLIC PANEL
Anion gap: 3 — ABNORMAL LOW (ref 5–15)
BUN: 7 mg/dL (ref 6–20)
CO2: 26 mmol/L (ref 22–32)
Calcium: 7 mg/dL — ABNORMAL LOW (ref 8.9–10.3)
Chloride: 111 mmol/L (ref 101–111)
Creatinine, Ser: 0.79 mg/dL (ref 0.61–1.24)
GFR calc Af Amer: >60 mL/min (ref >60)
GFR calc non Af Amer: >60 mL/min (ref >60)
Glucose, Bld: 113 mg/dL — ABNORMAL HIGH (ref 65–99)
Potassium: 3.7 mmol/L (ref 3.5–5.1)
Sodium: 140 mmol/L (ref 135–145)

## 2016-09-07 MED ORDER — POLYETHYLENE GLYCOL 3350 17 G PO PACK
17.0000 g | PACK | Freq: Every day | ORAL | Status: DC
  Administered 2016-09-07 – 2016-09-08 (×2): 17 g via ORAL
  Filled 2016-09-07 (×2): qty 1

## 2016-09-07 MED ORDER — SENNOSIDES-DOCUSATE SODIUM 8.6-50 MG PO TABS
1.0000 | ORAL_TABLET | Freq: Once | ORAL | Status: AC
  Administered 2016-09-07: 1 via ORAL
  Filled 2016-09-07: qty 1

## 2016-09-07 NOTE — Progress Notes (Signed)
Family Medicine Teaching Service Daily Progress Note Intern Pager: [phone]  Patient name: Lonnie Schaefer Medical record number: [id number] Date of birth: 08-20-1940 Age: 76 y.o. Gender: male  Primary Care Provider: [name], FNP Consultants: Gen Surg Code Status: Full  Pt Overview and Major Events to Date:  Patient present with RUQ pain found on CT to be cholecystitis.   Lap cholecystectomy performed 8/3 with no major complications.  Patient has Hx of mild COPD and recurrent SBOs  Assessment and Plan: S/P cholecystemoy: Vitals within normal limits. Physical exam showing diffuse abdominal tenderness.  -watch until 8/5 per surgery -per surgery clear liquids -scheduled tylenol/prn roxy for pain -Zofran when necessary for nausea -Continue Zosyn until PM 8/4  COPD with chronic respiratory failure: Home medications include Spiriva and albuterol when necessary. 2 L of oxygen at night. Was on 4L overnight 8/3 although unsure if that was necessary Quit smoking in 2001. -Continue home Spiriva -Albuterol when necessary -Pulse ox with vitals -2 L oxygen at night  History of osteoporosis -Fosfomax d/c during stay per pharmacy, for esophagitis concern while supine  FEN/GI: clear liquids per surgery Prophylaxis: miralax/zofran  Disposition:watch overnight 8/4 per surgery then likely discharge to home  Subjective:  Patient well appearing and mildly uncomfrotable during interview, was in agreement with discharge plans and in good spirits   Objective: Temp:  [97.6 F (36.4 C)-98.8 F (37.1 C)] 97.9 F (36.6 C) (08/04 1037) Pulse Rate:  [66-95] 79 (08/04 1037) Resp:  [16-31] 16 (08/04 1037) BP: (116-163)/(65-92) 116/92 (08/04 1037) SpO2:  [90 %-97 %] 90 % (08/04 1037) Physical Exam: General: well appearing, slightly uncomfortable Cardiovascular: RRR, no murmurs noted Respiratory: lungs CTA, no wheezes/crackles noted Abdomen: diffusely tender, no rebound tenderness, belly  still soft to palpation, incision dressing clean/dry Extremities: patient ambulating freely, no edema/wounding noted  Laboratory:  Recent Labs Lab 09/05/16 1640 09/06/16 0501  WBC 9.4 5.8  HGB 14.1 11.3*  HCT 41.4 35.6*  PLT 292 235    Recent Labs Lab 09/05/16 1640 09/06/16 0501 09/07/16 0646  NA 137 141 140  K 3.8 4.3 3.7  CL 100* 109 111  CO2 27 27 26   BUN 10 9 7   CREATININE 1.03 1.04 0.79  CALCIUM 9.1 7.7* 7.0*  PROT 7.2  --   --   BILITOT 1.7*  --   --   ALKPHOS 57  --   --   ALT 13*  --   --   AST 18  --   --   GLUCOSE 118* 95 113*      Imaging/Diagnostic Tests: Dg Cholangiogram Operative  Result Date: 09/06/2016 CLINICAL DATA:  76 year old male with acute cholecystitis EXAM: INTRAOPERATIVE CHOLANGIOGRAM TECHNIQUE: Cholangiographic images from the C-arm fluoroscopic device were submitted for interpretation post-operatively. Please see the procedural report for the amount of contrast and the fluoroscopy time utilized. COMPARISON:  CT scan of the abdomen and pelvis 09/05/2016 FINDINGS: Cine clip and single saved image obtained during intraoperative cholangiogram at the time of laparoscopic cholecystectomy. The images depict cannulation of the cystic duct remanent and opacification of the biliary tree. No evidence of biliary ductal dilatation, stenosis, stricture or choledocholithiasis. Contrast material passes freely through the ampulla and into the duodenum. There is some reflux of contrast material into the main hepatic duct. IMPRESSION: Negative intraoperative cholangiogram. Electronically Signed   By: Jacqulynn Cadet M.D.   On: 09/06/2016 14:26   Ct Abdomen Pelvis W Contrast  Result Date: 09/05/2016 CLINICAL DATA:  76 year old male with right  upper quadrant abdominal pain. EXAM: CT ABDOMEN AND PELVIS WITH CONTRAST TECHNIQUE: Multidetector CT imaging of the abdomen and pelvis was performed using the standard protocol following bolus administration of intravenous  contrast. CONTRAST:  ISOVUE-300 IOPAMIDOL (ISOVUE-300) INJECTION 61% COMPARISON:  Abdominal CT dated 06/06/2016 FINDINGS: Lower chest: There is mild elevation of the right hemidiaphragm with right lung base subsegmental atelectatic changes. There is advanced coronary vascular calcification involving the LAD. No intra-abdominal free air or free fluid. Hepatobiliary: The liver is unremarkable. No intrahepatic biliary ductal dilatation. The gallbladder is mildly distended. There is diffuse enhancement of the gallbladder mucosa. There is a linear septation within the body/fundus of the gallbladder with focal area of defect through the septation. Although this may represent a gallbladder fold findings concerning for focal or rupture of the gallbladder fundus. There is a 1.4 x 3.4 cm loculated collection contiguous with the lumen of the gallbladder. Small amount of loculated fluid measuring 9 x 18 x 34 mm extends inferiorly along the lateral peritoneal wall. Findings most consistent with perforated gallbladder likely secondary to gangrenous cholecystitis. Further evaluation with ultrasound recommended. Pancreas: Multiple punctate calcific foci in the pancreas likely sequela of prior pancreatitis. The pancreas is otherwise unremarkable. No peripancreatic inflammatory changes or dilatation of the main pancreatic duct. Spleen: Normal in size without focal abnormality. Adrenals/Urinary Tract: The adrenal glands are unremarkable. There is a small left renal inferior pole cyst. Probable nonobstructing renal calculi measuring 6 mm in the inferior pole of the left kidney and 2 mm in the inferior pole of the right kidney. A 6 mm high attenuating surface in the superior pole of the left kidney may represent a nonobstructing stone versus excreted contrast. There is no hydronephrosis on either side. There is symmetric enhancement and excretion of contrast by both kidneys. The visualized ureters and urinary bladder appear  unremarkable. Stomach/Bowel: There is extensive sigmoid diverticulosis with scattered colonic diverticula without active inflammatory changes. There is no evidence of bowel obstruction. Appendectomy. Vascular/Lymphatic: There is advanced aortoiliac atherosclerotic disease. The origins of the celiac axis, SMA, IMA and the renal arteries are patent. The SMV, splenic vein, and main portal vein are patent. No portal venous gas identified. There is no adenopathy. Reproductive: The prostate and seminal vesicles are grossly unremarkable. Other: There is a small fat containing umbilical hernia. There is slight protrusion of the anterior wall of a loop of small bowel into the hernia. No associated inflammatory changes. A smaller fat containing supraumbilical hernia is also noted. Musculoskeletal: Degenerative changes of the spine at L5-S1. No acute osseous pathology. IMPRESSION: 1. Perforated gallbladder with a loculated fluid adjacent the gallbladder fundus likely secondary to gangrenous cholecystitis. Further evaluation with ultrasound recommended. 2. Colonic diverticulosis.  No bowel obstruction. 3.  Aortic Atherosclerosis (ICD10-I70.0). Electronically Signed   By: Anner Crete M.D.   On: 09/05/2016 20:00     Sherene Sires, DO 09/07/2016, 11:26 AM PGY-1, [place] Intern pager: [phone], text pages welcome

## 2016-09-07 NOTE — Progress Notes (Signed)
Patient ID: Lonnie Schaefer, male   DOB: April 05, 1940, 76 y.o.   MRN: [id number] 1 Day Post-Op   Subjective: Very sore, no severe pain. Tolerating clear liquids. He has not been mobilizing much yet.  Objective: Vital signs in last 24 hours: Temp:  [97.6 F (36.4 C)-98.8 F (37.1 C)] 97.6 F (36.4 C) (08/04 0556) Pulse Rate:  [66-95] 74 (08/04 0556) Resp:  [16-31] 16 (08/04 0556) BP: (121-163)/(65-81) 122/68 (08/04 0556) SpO2:  [90 %-97 %] 90 % (08/04 0842) Last BM Date: 09/05/16  Intake/Output from previous day: 08/03 0701 - 08/04 0700 In: 3294.6 [P.O.:630; I.V.:2664.6] Out: -  Intake/Output this shift: No intake/output data recorded.  General appearance: alert, cooperative, no distress and Somewhat frail-appearing older male Resp: Some mild shortness of breath getting in and out of bed. Breath sounds clear at rest. GI: Minimal distention. Soft with mild appropriate incisional tenderness Incision/Wound: Clean and dry  Lab Results:   Recent Labs  09/05/16 1640 09/06/16 0501  WBC 9.4 5.8  HGB 14.1 11.3*  HCT 41.4 35.6*  PLT 292 235   BMET  Recent Labs  09/06/16 0501 09/07/16 0646  NA 141 140  K 4.3 3.7  CL 109 111  CO2 27 26  GLUCOSE 95 113*  BUN 9 7  CREATININE 1.04 0.79  CALCIUM 7.7* 7.0*     Studies/Results: Dg Cholangiogram Operative  Result Date: 09/06/2016 CLINICAL DATA:  76 year old male with acute cholecystitis EXAM: INTRAOPERATIVE CHOLANGIOGRAM TECHNIQUE: Cholangiographic images from the C-arm fluoroscopic device were submitted for interpretation post-operatively. Please see the procedural report for the amount of contrast and the fluoroscopy time utilized. COMPARISON:  CT scan of the abdomen and pelvis 09/05/2016 FINDINGS: Cine clip and single saved image obtained during intraoperative cholangiogram at the time of laparoscopic cholecystectomy. The images depict cannulation of the cystic duct remanent and opacification of the biliary tree. No evidence of  biliary ductal dilatation, stenosis, stricture or choledocholithiasis. Contrast material passes freely through the ampulla and into the duodenum. There is some reflux of contrast material into the main hepatic duct. IMPRESSION: Negative intraoperative cholangiogram. Electronically Signed   By: Jacqulynn Cadet M.D.   On: 09/06/2016 14:26   Ct Abdomen Pelvis W Contrast  Result Date: 09/05/2016 CLINICAL DATA:  76 year old male with right upper quadrant abdominal pain. EXAM: CT ABDOMEN AND PELVIS WITH CONTRAST TECHNIQUE: Multidetector CT imaging of the abdomen and pelvis was performed using the standard protocol following bolus administration of intravenous contrast. CONTRAST:  ISOVUE-300 IOPAMIDOL (ISOVUE-300) INJECTION 61% COMPARISON:  Abdominal CT dated 06/06/2016 FINDINGS: Lower chest: There is mild elevation of the right hemidiaphragm with right lung base subsegmental atelectatic changes. There is advanced coronary vascular calcification involving the LAD. No intra-abdominal free air or free fluid. Hepatobiliary: The liver is unremarkable. No intrahepatic biliary ductal dilatation. The gallbladder is mildly distended. There is diffuse enhancement of the gallbladder mucosa. There is a linear septation within the body/fundus of the gallbladder with focal area of defect through the septation. Although this may represent a gallbladder fold findings concerning for focal or rupture of the gallbladder fundus. There is a 1.4 x 3.4 cm loculated collection contiguous with the lumen of the gallbladder. Small amount of loculated fluid measuring 9 x 18 x 34 mm extends inferiorly along the lateral peritoneal wall. Findings most consistent with perforated gallbladder likely secondary to gangrenous cholecystitis. Further evaluation with ultrasound recommended. Pancreas: Multiple punctate calcific foci in the pancreas likely sequela of prior pancreatitis. The pancreas is otherwise unremarkable. No peripancreatic  inflammatory  changes or dilatation of the main pancreatic duct. Spleen: Normal in size without focal abnormality. Adrenals/Urinary Tract: The adrenal glands are unremarkable. There is a small left renal inferior pole cyst. Probable nonobstructing renal calculi measuring 6 mm in the inferior pole of the left kidney and 2 mm in the inferior pole of the right kidney. A 6 mm high attenuating surface in the superior pole of the left kidney may represent a nonobstructing stone versus excreted contrast. There is no hydronephrosis on either side. There is symmetric enhancement and excretion of contrast by both kidneys. The visualized ureters and urinary bladder appear unremarkable. Stomach/Bowel: There is extensive sigmoid diverticulosis with scattered colonic diverticula without active inflammatory changes. There is no evidence of bowel obstruction. Appendectomy. Vascular/Lymphatic: There is advanced aortoiliac atherosclerotic disease. The origins of the celiac axis, SMA, IMA and the renal arteries are patent. The SMV, splenic vein, and main portal vein are patent. No portal venous gas identified. There is no adenopathy. Reproductive: The prostate and seminal vesicles are grossly unremarkable. Other: There is a small fat containing umbilical hernia. There is slight protrusion of the anterior wall of a loop of small bowel into the hernia. No associated inflammatory changes. A smaller fat containing supraumbilical hernia is also noted. Musculoskeletal: Degenerative changes of the spine at L5-S1. No acute osseous pathology. IMPRESSION: 1. Perforated gallbladder with a loculated fluid adjacent the gallbladder fundus likely secondary to gangrenous cholecystitis. Further evaluation with ultrasound recommended. 2. Colonic diverticulosis.  No bowel obstruction. 3.  Aortic Atherosclerosis (ICD10-I70.0). Electronically Signed   By: Anner Crete M.D.   On: 09/05/2016 20:00    Anti-infectives: Anti-infectives    Start     Dose/Rate Route  Frequency Ordered Stop   09/06/16 1730  piperacillin-tazobactam (ZOSYN) IVPB 3.375 g    Comments:  Continue abx for 24 h post-op   3.375 g 12.5 mL/hr over 240 Minutes Intravenous Every 8 hours 09/06/16 1441 09/07/16 1729   09/06/16 0930  piperacillin-tazobactam (ZOSYN) IVPB 3.375 g  Status:  Discontinued     3.375 g 12.5 mL/hr over 240 Minutes Intravenous Every 8 hours 09/06/16 0919 09/06/16 1441   09/05/16 2030  piperacillin-tazobactam (ZOSYN) IVPB 3.375 g     3.375 g 100 mL/hr over 30 Minutes Intravenous  Once 09/05/16 2017 09/05/16 2144      Assessment/Plan: s/p Procedure(s): LAPAROSCOPIC CHOLECYSTECTOMY WITH  INTRAOPERATIVE CHOLANGIOGRAM Stable postoperatively. COPD. Would favor observation for another 24 hours and let him get up and move around some. He does appear mildly short of breath with any exertion today. Medicine following.   LOS: 2 days    Peggyann Zwiefelhofer T 09/07/2016

## 2016-09-08 DIAGNOSIS — K810 Acute cholecystitis: Secondary | ICD-10-CM

## 2016-09-08 DIAGNOSIS — J449 Chronic obstructive pulmonary disease, unspecified: Secondary | ICD-10-CM

## 2016-09-08 MED ORDER — OXYCODONE HCL 5 MG PO TABS: 5.0000 mg | ORAL_TABLET | ORAL | 0 refills | Status: AC | PRN

## 2016-09-08 NOTE — Progress Notes (Signed)
Patient ID: Lonnie Schaefer, male   DOB: 1940-08-11, 76 y.o.   MRN: [id number] 2 Days Post-Op   Subjective: Feeling better today. Mild pain/soreness. Tolerating fluids well. Has had a little nausea with eating but no vomiting.  Objective: Vital signs in last 24 hours: Temp:  [97.9 F (36.6 C)-98.2 F (36.8 C)] 98 F (36.7 C) (08/05 0438) Pulse Rate:  [71-81] 81 (08/05 0438) Resp:  [16-18] 18 (08/05 0438) BP: (116-129)/(61-92) 121/61 (08/05 0438) SpO2:  [90 %-99 %] 98 % (08/05 0438) Last BM Date: 09/05/16  Intake/Output from previous day: 08/04 0701 - 08/05 0700 In: 3264.2 [P.O.:360; I.V.:2904.2] Out: -  Intake/Output this shift: No intake/output data recorded.  General appearance: alert, cooperative and no distress Resp: No wheezing or increased work of breathing GI: Nondistended, soft and nontender Incision/Wound: No erythema or drainage  Lab Results:   Recent Labs  09/05/16 1640 09/06/16 0501  WBC 9.4 5.8  HGB 14.1 11.3*  HCT 41.4 35.6*  PLT 292 235   BMET  Recent Labs  09/06/16 0501 09/07/16 0646  NA 141 140  K 4.3 3.7  CL 109 111  CO2 27 26  GLUCOSE 95 113*  BUN 9 7  CREATININE 1.04 0.79  CALCIUM 7.7* 7.0*     Studies/Results: Dg Cholangiogram Operative  Result Date: 09/06/2016 CLINICAL DATA:  76 year old male with acute cholecystitis EXAM: INTRAOPERATIVE CHOLANGIOGRAM TECHNIQUE: Cholangiographic images from the C-arm fluoroscopic device were submitted for interpretation post-operatively. Please see the procedural report for the amount of contrast and the fluoroscopy time utilized. COMPARISON:  CT scan of the abdomen and pelvis 09/05/2016 FINDINGS: Cine clip and single saved image obtained during intraoperative cholangiogram at the time of laparoscopic cholecystectomy. The images depict cannulation of the cystic duct remanent and opacification of the biliary tree. No evidence of biliary ductal dilatation, stenosis, stricture or choledocholithiasis.  Contrast material passes freely through the ampulla and into the duodenum. There is some reflux of contrast material into the main hepatic duct. IMPRESSION: Negative intraoperative cholangiogram. Electronically Signed   By: Jacqulynn Cadet M.D.   On: 09/06/2016 14:26    Anti-infectives: Anti-infectives    Start     Dose/Rate Route Frequency Ordered Stop   09/06/16 1730  piperacillin-tazobactam (ZOSYN) IVPB 3.375 g    Comments:  Continue abx for 24 h post-op   3.375 g 12.5 mL/hr over 240 Minutes Intravenous Every 8 hours 09/06/16 1441 09/07/16 1335   09/06/16 0930  piperacillin-tazobactam (ZOSYN) IVPB 3.375 g  Status:  Discontinued     3.375 g 12.5 mL/hr over 240 Minutes Intravenous Every 8 hours 09/06/16 0919 09/06/16 1441   09/05/16 2030  piperacillin-tazobactam (ZOSYN) IVPB 3.375 g     3.375 g 100 mL/hr over 30 Minutes Intravenous  Once 09/05/16 2017 09/05/16 2144      Assessment/Plan: s/p Procedure(s): LAPAROSCOPIC CHOLECYSTECTOMY WITH  INTRAOPERATIVE CHOLANGIOGRAM Doing well postoperatively without apparent complication. Okay for discharge today.    LOS: 3 days    Lonnie Schaefer T 09/08/2016

## 2016-09-08 NOTE — Discharge Summary (Signed)
[facility] Discharge Summary  Patient name: Lonnie Schaefer Medical record number: [id number] Date of birth: 1940-06-19 Age: 76 y.o. Gender: male Date of Admission: 09/05/2016  Date of Discharge: 09/10/16 Admitting Physician: Blane Ohara McDiarmid, MD  Primary Care Provider: [name], FNP Consultants: Carin Hock. Surgery  Indication for Hospitalization: cholelithiasis  Discharge Diagnoses/Problem List:  cholelithisis COPD  Disposition: home  Discharge Condition: stable  Discharge Exam: General: well appearing, slightly uncomfortable Cardiovascular: RRR, no murmurs noted Respiratory: lungs CTA, no wheezes/crackles noted Abdomen: minimal tenderness, no rebound tenderness, belly still soft to palpation, incision dressing clean/dry Extremities: patient ambulating freely, no edema/wounding noted  Brief Hospital Course:  Patient was admitted in stable condition for abdominal pain and RUQ US showing chololithiasis.  Lap cholecystectomy performed the next day with no noted complications.  Due to patient's concern of pain, he was kept an additional night and discharged with his home meds and oxycodone prn to 7 days post surgery.  Issues for Follow Up:  1. Monitor pain progression 2. Continue intentional effort to respirate deeply to avoid complications. 3.   Significant Procedures: cholecystectomy  Significant Labs and Imaging:   Recent Labs Lab 09/05/16 1640 09/06/16 0501  WBC 9.4 5.8  HGB 14.1 11.3*  HCT 41.4 35.6*  PLT 292 235    Recent Labs Lab 09/05/16 1640 09/06/16 0501 09/07/16 0646  NA 137 141 140  K 3.8 4.3 3.7  CL 100* 109 111  CO2 27 27 26   GLUCOSE 118* 95 113*  BUN 10 9 7   CREATININE 1.03 1.04 0.79  CALCIUM 9.1 7.7* 7.0*  ALKPHOS 57  --   --   AST 18  --   --   ALT 13*  --   --   ALBUMIN 3.3*  --   --       Results/Tests Pending at Time of Discharge: N/A  Discharge Medications:  Allergies as of 09/08/2016   No Known  Allergies     Medication List    STOP taking these medications   traMADol 50 MG tablet Commonly known as:  ULTRAM     TAKE these medications   albuterol 108 (90 Base) MCG/ACT inhaler Commonly known as:  PROVENTIL HFA;VENTOLIN HFA Inhale 1 puff into the lungs every 6 (six) hours as needed for wheezing.   alendronate 70 MG tablet Commonly known as:  FOSAMAX Take 70 mg by mouth every Wednesday.   aspirin 81 MG chewable tablet Chew 81 mg by mouth daily.   clobetasol ointment 0.05 % Commonly known as:  TEMOVATE Apply 1 application topically 2 (two) times daily as needed (rash).   mupirocin cream 2 % Commonly known as:  BACTROBAN Apply topically 2 (two) times daily. Apply to rash on the back What changed:  how much to take  when to take this  reasons to take this  additional instructions   ondansetron 4 MG tablet Commonly known as:  ZOFRAN Take 1 tablet (4 mg total) by mouth every 6 (six) hours.   oxyCODONE 5 MG immediate release tablet Commonly known as:  Oxy IR/ROXICODONE Take 1 tablet (5 mg total) by mouth every 4 (four) hours as needed for moderate pain.   oxyCODONE-acetaminophen 5-325 MG tablet Commonly known as:  PERCOCET Take 1-2 tablets by mouth every 4 (four) hours as needed.   OXYGEN Inhale 2 each into the lungs See admin instructions. Inhale 2 liters at bedtime   tiotropium 18 MCG inhalation capsule Commonly known as:  [place] 18  mcg into inhaler and inhale daily.       Discharge Instructions: Please refer to Patient Instructions section of EMR for full details.  Patient was counseled important signs and symptoms that should prompt return to medical care, changes in medications, dietary instructions, activity restrictions, and follow up appointments.   Follow-Up Appointments: Follow-up Information    [name], FNP Follow up.   Specialty:  Nurse Practitioner Contact information: [place]  [id number] [phone]        [facility], [place]. Go on 09/19/2016.   Specialty:  General Surgery Why:  at 4:00 PM for post-operative follow up. please arrive 30 minutes early.  Contact information: [address] [place], [place], [place] 09/10/2016, 12:18 PM PGY-1, [place]

## 2016-09-08 NOTE — Progress Notes (Signed)
Family Medicine Teaching Service Daily Progress Note Intern Pager: [phone]  Patient name: Lonnie Schaefer Medical record number: [id number] Date of birth: 1940-08-01 Age: 76 y.o. Gender: male  Primary Care Provider: [name], FNP Consultants: Gen Surg Code Status: Full  Pt Overview and Major Events to Date:  Patient present with RUQ pain found on CT to be cholecystitis.   Lap cholecystectomy performed 8/3 with no major complications.  Patient has Hx of mild COPD and recurrent SBOs  Assessment and Plan: S/P cholecystemoy: Vitals within normal limits. Physical exam showing diffuse abdominal tenderness.  -clear for d/c per surgery -scheduled tylenol/prn roxy for pain -Zofran when necessary for nausea -Continue Zosyn until PM 8/4  COPD with chronic respiratory failure: Home medications include Spiriva and albuterol when necessary. 2 L of oxygen at night. Pt O2sat 95% on RA. Was on 4L overnight 8/3 although unsure if that was necessary Quit smoking in 2001. -Continue home Spiriva -Albuterol when necessary -Pulse ox with vitals -2 L oxygen at night  History of osteoporosis -Fosfomax d/c during stay per pharmacy, for esophagitis concern while supine  FEN/GI: clear liquids per surgery Prophylaxis: miralax/zofran  Disposition:watch overnight 8/4 per surgery then likely discharge to home  Subjective:  Patient well appearing and mildly uncomfrotable during interview, was in agreement with discharge plans and in good spirits. Denies SOB.    Objective: Temp:  [98 F (36.7 C)-98.4 F (36.9 C)] 98.4 F (36.9 C) (08/05 1003) Pulse Rate:  [71-81] 81 (08/05 0438) Resp:  [16-18] 18 (08/05 1003) BP: (121-129)/(61-65) 129/61 (08/05 1003) SpO2:  [91 %-99 %] 91 % (08/05 1003) Physical Exam: General: well appearing, slightly uncomfortable Cardiovascular: RRR, no murmurs noted Respiratory: lungs CTA, no wheezes/crackles noted Abdomen: diffusely tender, no rebound tenderness,  belly still soft to palpation, incision dressing clean/dry Extremities: patient ambulating freely, no edema/wounding noted  Laboratory:  Recent Labs Lab 09/05/16 1640 09/06/16 0501  WBC 9.4 5.8  HGB 14.1 11.3*  HCT 41.4 35.6*  PLT 292 235    Recent Labs Lab 09/05/16 1640 09/06/16 0501 09/07/16 0646  NA 137 141 140  K 3.8 4.3 3.7  CL 100* 109 111  CO2 27 27 26   BUN 10 9 7   CREATININE 1.03 1.04 0.79  CALCIUM 9.1 7.7* 7.0*  PROT 7.2  --   --   BILITOT 1.7*  --   --   ALKPHOS 57  --   --   ALT 13*  --   --   AST 18  --   --   GLUCOSE 118* 95 113*      Imaging/Diagnostic Tests: Dg Cholangiogram Operative  Result Date: 09/06/2016 CLINICAL DATA:  76 year old male with acute cholecystitis EXAM: INTRAOPERATIVE CHOLANGIOGRAM TECHNIQUE: Cholangiographic images from the C-arm fluoroscopic device were submitted for interpretation post-operatively. Please see the procedural report for the amount of contrast and the fluoroscopy time utilized. COMPARISON:  CT scan of the abdomen and pelvis 09/05/2016 FINDINGS: Cine clip and single saved image obtained during intraoperative cholangiogram at the time of laparoscopic cholecystectomy. The images depict cannulation of the cystic duct remanent and opacification of the biliary tree. No evidence of biliary ductal dilatation, stenosis, stricture or choledocholithiasis. Contrast material passes freely through the ampulla and into the duodenum. There is some reflux of contrast material into the main hepatic duct. IMPRESSION: Negative intraoperative cholangiogram. Electronically Signed   By: Jacqulynn Cadet M.D.   On: 09/06/2016 14:26   Ct Abdomen Pelvis W Contrast  Result Date: 09/05/2016 CLINICAL DATA:  76 year old male with right upper quadrant abdominal pain. EXAM: CT ABDOMEN AND PELVIS WITH CONTRAST TECHNIQUE: Multidetector CT imaging of the abdomen and pelvis was performed using the standard protocol following bolus administration of  intravenous contrast. CONTRAST:  ISOVUE-300 IOPAMIDOL (ISOVUE-300) INJECTION 61% COMPARISON:  Abdominal CT dated 06/06/2016 FINDINGS: Lower chest: There is mild elevation of the right hemidiaphragm with right lung base subsegmental atelectatic changes. There is advanced coronary vascular calcification involving the LAD. No intra-abdominal free air or free fluid. Hepatobiliary: The liver is unremarkable. No intrahepatic biliary ductal dilatation. The gallbladder is mildly distended. There is diffuse enhancement of the gallbladder mucosa. There is a linear septation within the body/fundus of the gallbladder with focal area of defect through the septation. Although this may represent a gallbladder fold findings concerning for focal or rupture of the gallbladder fundus. There is a 1.4 x 3.4 cm loculated collection contiguous with the lumen of the gallbladder. Small amount of loculated fluid measuring 9 x 18 x 34 mm extends inferiorly along the lateral peritoneal wall. Findings most consistent with perforated gallbladder likely secondary to gangrenous cholecystitis. Further evaluation with ultrasound recommended. Pancreas: Multiple punctate calcific foci in the pancreas likely sequela of prior pancreatitis. The pancreas is otherwise unremarkable. No peripancreatic inflammatory changes or dilatation of the main pancreatic duct. Spleen: Normal in size without focal abnormality. Adrenals/Urinary Tract: The adrenal glands are unremarkable. There is a small left renal inferior pole cyst. Probable nonobstructing renal calculi measuring 6 mm in the inferior pole of the left kidney and 2 mm in the inferior pole of the right kidney. A 6 mm high attenuating surface in the superior pole of the left kidney may represent a nonobstructing stone versus excreted contrast. There is no hydronephrosis on either side. There is symmetric enhancement and excretion of contrast by both kidneys. The visualized ureters and urinary bladder appear  unremarkable. Stomach/Bowel: There is extensive sigmoid diverticulosis with scattered colonic diverticula without active inflammatory changes. There is no evidence of bowel obstruction. Appendectomy. Vascular/Lymphatic: There is advanced aortoiliac atherosclerotic disease. The origins of the celiac axis, SMA, IMA and the renal arteries are patent. The SMV, splenic vein, and main portal vein are patent. No portal venous gas identified. There is no adenopathy. Reproductive: The prostate and seminal vesicles are grossly unremarkable. Other: There is a small fat containing umbilical hernia. There is slight protrusion of the anterior wall of a loop of small bowel into the hernia. No associated inflammatory changes. A smaller fat containing supraumbilical hernia is also noted. Musculoskeletal: Degenerative changes of the spine at L5-S1. No acute osseous pathology. IMPRESSION: 1. Perforated gallbladder with a loculated fluid adjacent the gallbladder fundus likely secondary to gangrenous cholecystitis. Further evaluation with ultrasound recommended. 2. Colonic diverticulosis.  No bowel obstruction. 3.  Aortic Atherosclerosis (ICD10-I70.0). Electronically Signed   By: Anner Crete M.D.   On: 09/05/2016 20:00     Bonnita Hollow, MD 09/08/2016, 11:28 AM PGY-1, [place] Intern pager: [phone], text pages welcome

## 2016-09-09 ENCOUNTER — Encounter (HOSPITAL_COMMUNITY): Payer: Self-pay | Admitting: General Surgery

## 2016-09-10 NOTE — Anesthesia Postprocedure Evaluation (Signed)
Anesthesia Post Note  Patient: Lonnie Schaefer  Procedure(s) Performed: Procedure(s) (LRB): LAPAROSCOPIC CHOLECYSTECTOMY WITH  INTRAOPERATIVE CHOLANGIOGRAM (N/A)     Patient location during evaluation: PACU Anesthesia Type: General Level of consciousness: awake and alert Pain management: pain level controlled Vital Signs Assessment: post-procedure vital signs reviewed and stable Respiratory status: spontaneous breathing, nonlabored ventilation, respiratory function stable and patient connected to nasal cannula oxygen Cardiovascular status: blood pressure returned to baseline and stable Postop Assessment: no signs of nausea or vomiting Anesthetic complications: no    Last Vitals:  Vitals:   09/08/16 0438 09/08/16 1003  BP: 121/61 129/61  Pulse: 81   Resp: 18 18  Temp: 36.7 C 36.9 C    Last Pain:  Vitals:   09/08/16 1003  TempSrc: Oral  PainSc:                  Danett Palazzo

## 2016-09-24 DIAGNOSIS — Z862 Personal history of diseases of the blood and blood-forming organs and certain disorders involving the immune mechanism: Secondary | ICD-10-CM | POA: Diagnosis not present

## 2016-09-24 DIAGNOSIS — K810 Acute cholecystitis: Secondary | ICD-10-CM | POA: Diagnosis not present

## 2016-11-13 DIAGNOSIS — Z0000 Encounter for general adult medical examination without abnormal findings: Secondary | ICD-10-CM | POA: Diagnosis not present

## 2016-11-13 DIAGNOSIS — Z23 Encounter for immunization: Secondary | ICD-10-CM | POA: Diagnosis not present

## 2016-11-13 DIAGNOSIS — G4734 Idiopathic sleep related nonobstructive alveolar hypoventilation: Secondary | ICD-10-CM | POA: Diagnosis not present

## 2016-11-13 DIAGNOSIS — J449 Chronic obstructive pulmonary disease, unspecified: Secondary | ICD-10-CM | POA: Diagnosis not present

## 2017-04-21 ENCOUNTER — Emergency Department (HOSPITAL_COMMUNITY): Payer: Medicare Other

## 2017-04-21 ENCOUNTER — Inpatient Hospital Stay (HOSPITAL_COMMUNITY): Payer: Medicare Other

## 2017-04-21 ENCOUNTER — Inpatient Hospital Stay (HOSPITAL_COMMUNITY)
Admission: EM | Admit: 2017-04-21 | Discharge: 2017-04-25 | DRG: 389 | Disposition: A | Discharge status: Home / Self Care | Payer: Medicare Other | Attending: Internal Medicine | Admitting: Internal Medicine

## 2017-04-21 ENCOUNTER — Encounter (HOSPITAL_COMMUNITY): Payer: Self-pay | Admitting: *Deleted

## 2017-04-21 ENCOUNTER — Other Ambulatory Visit: Payer: Self-pay

## 2017-04-21 DIAGNOSIS — Z7951 Long term (current) use of inhaled steroids: Secondary | ICD-10-CM

## 2017-04-21 DIAGNOSIS — Z9889 Other specified postprocedural states: Secondary | ICD-10-CM | POA: Diagnosis not present

## 2017-04-21 DIAGNOSIS — R64 Cachexia: Secondary | ICD-10-CM | POA: Diagnosis present

## 2017-04-21 DIAGNOSIS — Z9981 Dependence on supplemental oxygen: Secondary | ICD-10-CM | POA: Diagnosis not present

## 2017-04-21 DIAGNOSIS — Z79899 Other long term (current) drug therapy: Secondary | ICD-10-CM

## 2017-04-21 DIAGNOSIS — K56699 Other intestinal obstruction unspecified as to partial versus complete obstruction: Secondary | ICD-10-CM | POA: Diagnosis not present

## 2017-04-21 DIAGNOSIS — J9610 Chronic respiratory failure, unspecified whether with hypoxia or hypercapnia: Secondary | ICD-10-CM | POA: Diagnosis present

## 2017-04-21 DIAGNOSIS — N179 Acute kidney failure, unspecified: Secondary | ICD-10-CM | POA: Diagnosis present

## 2017-04-21 DIAGNOSIS — Z7983 Long term (current) use of bisphosphonates: Secondary | ICD-10-CM | POA: Diagnosis not present

## 2017-04-21 DIAGNOSIS — K56600 Partial intestinal obstruction, unspecified as to cause: Principal | ICD-10-CM | POA: Diagnosis present

## 2017-04-21 DIAGNOSIS — Z8711 Personal history of peptic ulcer disease: Secondary | ICD-10-CM | POA: Diagnosis not present

## 2017-04-21 DIAGNOSIS — K56609 Unspecified intestinal obstruction, unspecified as to partial versus complete obstruction: Secondary | ICD-10-CM

## 2017-04-21 DIAGNOSIS — Z87891 Personal history of nicotine dependence: Secondary | ICD-10-CM

## 2017-04-21 DIAGNOSIS — Z978 Presence of other specified devices: Secondary | ICD-10-CM | POA: Diagnosis not present

## 2017-04-21 DIAGNOSIS — R109 Unspecified abdominal pain: Secondary | ICD-10-CM

## 2017-04-21 DIAGNOSIS — Z6822 Body mass index (BMI) 22.0-22.9, adult: Secondary | ICD-10-CM

## 2017-04-21 DIAGNOSIS — K5651 Intestinal adhesions [bands], with partial obstruction: Secondary | ICD-10-CM

## 2017-04-21 DIAGNOSIS — Z7982 Long term (current) use of aspirin: Secondary | ICD-10-CM

## 2017-04-21 DIAGNOSIS — K5669 Other partial intestinal obstruction: Secondary | ICD-10-CM | POA: Diagnosis not present

## 2017-04-21 DIAGNOSIS — Z4659 Encounter for fitting and adjustment of other gastrointestinal appliance and device: Secondary | ICD-10-CM

## 2017-04-21 DIAGNOSIS — R1110 Vomiting, unspecified: Secondary | ICD-10-CM | POA: Diagnosis not present

## 2017-04-21 DIAGNOSIS — Z9049 Acquired absence of other specified parts of digestive tract: Secondary | ICD-10-CM | POA: Diagnosis not present

## 2017-04-21 DIAGNOSIS — Z8719 Personal history of other diseases of the digestive system: Secondary | ICD-10-CM | POA: Diagnosis not present

## 2017-04-21 DIAGNOSIS — Z4682 Encounter for fitting and adjustment of non-vascular catheter: Secondary | ICD-10-CM | POA: Diagnosis not present

## 2017-04-21 DIAGNOSIS — J449 Chronic obstructive pulmonary disease, unspecified: Secondary | ICD-10-CM | POA: Diagnosis not present

## 2017-04-21 HISTORY — DX: Unspecified intestinal obstruction, unspecified as to partial versus complete obstruction: K56.609

## 2017-04-21 LAB — COMPREHENSIVE METABOLIC PANEL
ALT: 13 U/L — ABNORMAL LOW (ref 17–63)
AST: 21 U/L (ref 15–41)
Albumin: 3.5 g/dL (ref 3.5–5.0)
Alkaline Phosphatase: 60 U/L (ref 38–126)
Anion gap: 15 (ref 5–15)
BUN: 16 mg/dL (ref 6–20)
CO2: 25 mmol/L (ref 22–32)
Calcium: 9.1 mg/dL (ref 8.9–10.3)
Chloride: 98 mmol/L — ABNORMAL LOW (ref 101–111)
Creatinine, Ser: 1.42 mg/dL — ABNORMAL HIGH (ref 0.61–1.24)
GFR calc Af Amer: 55 mL/min — ABNORMAL LOW (ref >60)
GFR calc non Af Amer: 47 mL/min — ABNORMAL LOW (ref >60)
Glucose, Bld: 175 mg/dL — ABNORMAL HIGH (ref 65–99)
Potassium: 4.5 mmol/L (ref 3.5–5.1)
Sodium: 138 mmol/L (ref 135–145)
Total Bilirubin: 1.3 mg/dL — ABNORMAL HIGH (ref 0.3–1.2)
Total Protein: 7.6 g/dL (ref 6.5–8.1)

## 2017-04-21 LAB — CBC
HCT: 46 % (ref 39.0–52.0)
Hemoglobin: 15.7 g/dL (ref 13.0–17.0)
MCH: 31.7 pg (ref 26.0–34.0)
MCHC: 34.1 g/dL (ref 30.0–36.0)
MCV: 92.9 fL (ref 78.0–100.0)
Platelets: 297 10*3/uL (ref 150–400)
RBC: 4.95 MIL/uL (ref 4.22–5.81)
RDW: 13.2 % (ref 11.5–15.5)
WBC: 8.5 10*3/uL (ref 4.0–10.5)

## 2017-04-21 LAB — LIPASE, BLOOD: Lipase: 24 U/L (ref 11–51)

## 2017-04-21 MED ORDER — IOPAMIDOL (ISOVUE-300) INJECTION 61%
INTRAVENOUS | Status: AC
  Administered 2017-04-21: 75 mL
  Filled 2017-04-21: qty 100

## 2017-04-21 MED ORDER — ALBUTEROL SULFATE (2.5 MG/3ML) 0.083% IN NEBU: 2.5000 mg | INHALATION_SOLUTION | Freq: Four times a day (QID) | RESPIRATORY_TRACT | Status: DC | PRN

## 2017-04-21 MED ORDER — MORPHINE SULFATE (PF) 4 MG/ML IV SOLN
4.0000 mg | Freq: Once | INTRAVENOUS | Status: AC
  Administered 2017-04-21: 4 mg via INTRAVENOUS
  Filled 2017-04-21: qty 1

## 2017-04-21 MED ORDER — LACTATED RINGERS IV SOLN
INTRAVENOUS | Status: DC
  Administered 2017-04-21 – 2017-04-25 (×8): via INTRAVENOUS

## 2017-04-21 MED ORDER — ACETAMINOPHEN 650 MG RE SUPP: 650.0000 mg | Freq: Four times a day (QID) | RECTAL | Status: DC | PRN

## 2017-04-21 MED ORDER — SODIUM CHLORIDE 0.9 % IV BOLUS (SEPSIS)
1000.0000 mL | Freq: Once | INTRAVENOUS | Status: AC
  Administered 2017-04-21: 1000 mL via INTRAVENOUS

## 2017-04-21 MED ORDER — PROMETHAZINE HCL 25 MG/ML IJ SOLN: 6.2500 mg | Freq: Four times a day (QID) | INTRAMUSCULAR | Status: DC | PRN

## 2017-04-21 MED ORDER — LACTATED RINGERS IV BOLUS (SEPSIS)
1000.0000 mL | Freq: Once | INTRAVENOUS | Status: AC
  Administered 2017-04-21: 1000 mL via INTRAVENOUS

## 2017-04-21 MED ORDER — TIOTROPIUM BROMIDE MONOHYDRATE 18 MCG IN CAPS
18.0000 ug | ORAL_CAPSULE | Freq: Every day | RESPIRATORY_TRACT | Status: DC
  Administered 2017-04-21 – 2017-04-25 (×5): 18 ug via RESPIRATORY_TRACT
  Filled 2017-04-21: qty 5

## 2017-04-21 MED ORDER — OXYCODONE-ACETAMINOPHEN 5-325 MG PO TABS
1.0000 | ORAL_TABLET | ORAL | Status: DC | PRN
  Administered 2017-04-21: 1 via ORAL
  Filled 2017-04-21: qty 1

## 2017-04-21 MED ORDER — ONDANSETRON 4 MG PO TBDP
4.0000 mg | ORAL_TABLET | Freq: Once | ORAL | Status: AC
Start: 2017-04-21 — End: 2017-04-21
  Administered 2017-04-21: 4 mg via ORAL
  Filled 2017-04-21: qty 1

## 2017-04-21 MED ORDER — ONDANSETRON HCL 4 MG/2ML IJ SOLN: 4.0000 mg | Freq: Three times a day (TID) | INTRAMUSCULAR | Status: DC | PRN

## 2017-04-21 MED ORDER — LIDOCAINE VISCOUS 2 % MT SOLN
OROMUCOSAL | Status: AC
  Filled 2017-04-21: qty 15

## 2017-04-21 MED ORDER — ENOXAPARIN SODIUM 40 MG/0.4ML ~~LOC~~ SOLN
40.0000 mg | SUBCUTANEOUS | Status: DC
  Administered 2017-04-21 – 2017-04-24 (×4): 40 mg via SUBCUTANEOUS
  Filled 2017-04-21 (×4): qty 0.4

## 2017-04-21 MED ORDER — HYDROMORPHONE HCL 1 MG/ML IJ SOLN
0.5000 mg | INTRAMUSCULAR | Status: DC | PRN
  Administered 2017-04-21 (×2): 0.5 mg via INTRAVENOUS
  Filled 2017-04-21 (×2): qty 1

## 2017-04-21 NOTE — ED Notes (Signed)
Admitting team at bedside.

## 2017-04-21 NOTE — ED Notes (Signed)
Pt returned from CT °

## 2017-04-21 NOTE — ED Notes (Signed)
NG tube placement attempted X2 without success.

## 2017-04-21 NOTE — ED Notes (Signed)
ED Provider at bedside. 

## 2017-04-21 NOTE — ED Triage Notes (Signed)
Pt feels he is having another bowel obstruction (hx of 2 surgeries for same).  Lower abdominal pain, watery diarrhea, vomiting.  Last formed stool several days ago.

## 2017-04-21 NOTE — ED Provider Notes (Signed)
[facility] EMERGENCY DEPARTMENT Provider Note   CSN: [id number] Arrival date & time: 04/21/17  0923     History   Chief Complaint Chief Complaint  Patient presents with  . Abdominal Pain    HPI Lonnie Schaefer is a 77 y.o. male.  HPI 77 year old male with a history of small bowel obstruction presents the emergency department with lower abdominal discomfort as well as nausea vomiting since yesterday.  He has had some watery diarrhea.  Denies fevers and chills.  Still with persistent abdominal discomfort at this time.  He has had a prior appendectomy as well as cholecystectomy and ventral hernia repair.  He states a history of small bowel obstructions and history of adhesions.   Past Medical History:  Diagnosis Date  . Bowel obstruction ([place]) 06/2009-10/01/2013 X 5; 04/03/2016  . COPD (chronic obstructive pulmonary disease) ([place])   . H/O hiatal hernia   . History of stomach ulcers ~ 1960  . On home oxygen therapy    "2L q hs" (04/03/2016)  . Pneumonia 1990s X 1  . Shortness of breath     Patient Active Problem List   Diagnosis Date Noted  . Acute cholecystitis 09/05/2016  . Leukocytosis 04/03/2016  . SBO (small bowel obstruction) ([place]) 04/03/2016  . Hyperbilirubinemia 04/03/2016  . AKI (acute kidney injury) ([place]) 04/03/2016  . Nausea, vomiting and diarrhea 10/01/2013  . Small bowel obstruction ([place]) 12/04/2012  . COPD (chronic obstructive pulmonary disease) ([place]) 02/23/2007    Past Surgical History:  Procedure Laterality Date  . ABDOMINAL EXPLORATION SURGERY  06/2009; 10/2009  . ABDOMINAL SURGERY    . APPENDECTOMY  10/2009  . CHOLECYSTECTOMY N/A 09/06/2016   Procedure: LAPAROSCOPIC CHOLECYSTECTOMY WITH  INTRAOPERATIVE CHOLANGIOGRAM;  Surgeon: Ralene Ok, MD;  Location: [facility];  Service: General;  Laterality: N/A;  . HERNIA REPAIR     "naval"  . 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 UMBILICAL HERNIA REPAIR  06/2009   w/mesh       Home Medications    Prior to Admission medications   Medication Sig Start Date End Date Taking? Authorizing Provider  albuterol (PROVENTIL HFA;VENTOLIN HFA) 108 (90 BASE) MCG/ACT inhaler Inhale 1 puff into the lungs every 6 (six) hours as needed for wheezing.    Yes [provider]  alendronate (FOSAMAX) 70 MG tablet Take 70 mg by mouth every Wednesday. 03/25/16  Yes [provider]  aspirin 81 MG chewable tablet Chew 81 mg by mouth daily.   Yes [provider]  clobetasol ointment (TEMOVATE) 3.00 % Apply 1 application topically 2 (two) times daily as needed (rash).   Yes [provider]  mupirocin cream (BACTROBAN) 2 % Apply topically 2 (two) times daily. Apply to rash on the back Patient taking differently: Apply 1 application topically 2 (two) times daily as needed. Apply to rash on the back 04/11/16  Yes Bonnielee Haff, MD  OXYGEN-HELIUM IN Inhale 2 each into the lungs See admin instructions. Inhale 2 liters at bedtime   Yes [provider]  tiotropium (SPIRIVA) 18 MCG inhalation capsule Place 18 mcg into inhaler and inhale daily.   Yes [provider]  ondansetron (ZOFRAN) 4 MG tablet Take 1 tablet (4 mg total) by mouth every 6 (six) hours. Patient not taking: Reported on 09/05/2016 06/06/16   Nat Christen, MD  oxyCODONE-acetaminophen (PERCOCET) 5-325 MG tablet Take 1-2 tablets by mouth every 4 (four) hours as needed. Patient not taking: Reported  on 09/05/2016 06/06/16   Nat Christen, MD    Family History No family history on file.  Social History Social History   Tobacco Use  . Smoking status: Former Smoker    Packs/day: 2.00    Years: 50.00    Pack years: 100.00    Types: Cigarettes    Last attempt to quit: 10/15/2001    Years since quitting: 15.5  . Smokeless tobacco: Never Used  Substance Use Topics  . Alcohol use: Yes    Comment: 04/03/2016 "quit drinking 01/1987"  . Drug use: No     Allergies   Patient has  no known allergies.   Review of Systems Review of Systems  All other systems reviewed and are negative.    Physical Exam Updated Vital Signs BP 122/71   Pulse 73   Temp 100 F (37.8 C) (Rectal)   Resp 20   Ht 5\' 11"  (1.803 m)   Wt 68.9 kg (152 lb)   SpO2 94%   BMI 21.20 kg/m   Physical Exam  Constitutional: He is oriented to person, place, and time. He appears well-developed and well-nourished.  HENT:  Head: Normocephalic and atraumatic.  Eyes: EOM are normal.  Neck: Normal range of motion.  Cardiovascular: Normal rate and regular rhythm.  Pulmonary/Chest: Effort normal and breath sounds normal. No respiratory distress.  Abdominal: Soft. He exhibits distension. He exhibits no mass. There is tenderness. There is no rebound and no guarding. No hernia.  Musculoskeletal: Normal range of motion.  Neurological: He is alert and oriented to person, place, and time.  Skin: Skin is warm and dry.  Psychiatric: He has a normal mood and affect. Judgment normal.  Nursing note and vitals reviewed.    ED Treatments / Results  Labs (all labs ordered are listed, but only abnormal results are displayed) Labs Reviewed  COMPREHENSIVE METABOLIC PANEL - Abnormal; Notable for the following components:      Result Value   Chloride 98 (*)    Glucose, Bld 175 (*)    Creatinine, Ser 1.42 (*)    ALT 13 (*)    Total Bilirubin 1.3 (*)    GFR calc non Af Amer 47 (*)    GFR calc Af Amer 55 (*)    All other components within normal limits  LIPASE, BLOOD  CBC  URINALYSIS, ROUTINE W REFLEX MICROSCOPIC    EKG  EKG Interpretation None       Radiology Ct Abdomen Pelvis W Contrast  Result Date: 04/21/2017 CLINICAL DATA:  Mid abdominal pain. History of small bowel obstruction. EXAM: CT ABDOMEN AND PELVIS WITH CONTRAST TECHNIQUE: Multidetector CT imaging of the abdomen and pelvis was performed using the standard protocol following bolus administration of intravenous contrast. CONTRAST:   79mL ISOVUE-300 IOPAMIDOL (ISOVUE-300) INJECTION 61% COMPARISON:  09/05/2016 FINDINGS: Lower chest: Lung bases are within normal. Calcified plaque over the left anterior descending coronary artery. Hepatobiliary: Previous cholecystectomy. Liver and biliary tree are within normal. Pancreas: Punctate calcification over the head of the pancreas unchanged. Pancreas otherwise within normal. Spleen: Normal. Adrenals/Urinary Tract: Adrenal glands are normal. Kidneys are normal in size without hydronephrosis or nephrolithiasis. There is an exophytic 1.3 cm cyst over the lower pole left kidney unchanged. Ureters and bladder are normal. Stomach/Bowel: Stomach is normal. There are several air and fluid-filled dilated small bowel loops over the mid small bowel measuring up to 4.9 cm in diameter. There is no clear transition point identified although there is some of gradual tapering of the dilated  small bowel to normal caliber small bowel with thickened wall over the midline lower abdomen. This may be due to a regional enteritis of inflammatory or infectious nature. Terminal ileum is within normal. Moderate diverticulosis of the colon most prominent over the sigmoid and distal descending colon. Air present throughout the colon. Previous appendectomy. Vascular/Lymphatic: Mild calcified plaque over the abdominal aorta and iliac arteries. No adenopathy. Reproductive: Normal. Other: No significant free fluid. No free peritoneal air. Small umbilical hernia unchanged as a loop of small bowel extends towards the hernia opening. Musculoskeletal: Mild degenerate change of the spine. IMPRESSION: Dilated proximal to mid small bowel measuring up to 4.9 cm in diameter with gradual tapering to normal caliber small bowel in the lower mid abdomen and pelvis where there is small bowel wall thickening suggesting a regional enteritis of infectious or inflammatory nature likely with secondary ileus. No free peritoneal air or free fluid. Recommend  serial follow-up imaging. 1.3 cm left renal cyst unchanged. Colonic diverticulosis. Small umbilical hernia without significant change. Aortic Atherosclerosis (ICD10-I70.0). Electronically Signed   By: Marin Olp M.D.   On: 04/21/2017 13:01    Procedures Procedures (including critical care time)  Medications Ordered in ED Medications  oxyCODONE-acetaminophen (PERCOCET/ROXICET) 5-325 MG per tablet 1 tablet (1 tablet Oral Given 04/21/17 1025)  ondansetron (ZOFRAN-ODT) disintegrating tablet 4 mg (4 mg Oral Given 04/21/17 1025)  sodium chloride 0.9 % bolus 1,000 mL (0 mLs Intravenous Stopped 04/21/17 1340)  iopamidol (ISOVUE-300) 61 % injection (75 mLs  Contrast Given 04/21/17 1223)  morphine 4 MG/ML injection 4 mg (4 mg Intravenous Given 04/21/17 1321)     Initial Impression / Assessment and Plan / ED Course  I have reviewed the triage vital signs and the nursing notes.  Pertinent labs & imaging results that were available during my care of the patient were reviewed by me and considered in my medical decision making (see chart for details).     CT scan concerning for ileus however given his advanced age as well as his history of small bowel obstructions will be treated as at least a partial small bowel obstruction.  NG tube now.  Patient will be admitted to hospital for ongoing workup.  IV fluids given.  Pain treated.   Final Clinical Impressions(s) / ED Diagnoses   Final diagnoses:  Acute abdominal pain  Small bowel obstruction [facility])    ED Discharge Orders    None       Jola Schmidt, MD 04/21/17 1409

## 2017-04-21 NOTE — H&P (Signed)
Date: 04/21/2017               Patient Name:  Lonnie Schaefer MRN: [id number]  DOB: September 06, 1940 Age / Sex: 77 y.o., male   PCP: [name], [place]         Medical Service: Internal Medicine Teaching Service         Attending Physician: Dr. Lucious Groves, DO    First Contact: Dr. Frederico Hamman  Pager: [phone]  Second Contact: Dr. 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  He has been unable to tolerate p.o. intake since yesterday morning and has been having an NBNB emesis with last episode yesterday after eating breakfast.  He also reports small amounts of watery diarrhea.  Denies melena or hematochezia.  States he is on a bowel regimen at home that he has been compliant with.  No recent changes in medications.  Last episode of SBO in 04/2016 and required surgical management. Denies headache, changes in vision, fevers, chills, chest pain, cough, urinary symptoms, and lower extremity swelling.  He does endorse shortness of breath on exertion at baseline from underlying COPD.  ED course: Patient was afebrile and hemodynamicaltly stable on presentation.  Remarkable for creatinine of 1.42.  CBC unremarkable.  CT abdomen/pelvis showed dilated proximal to mid small bowel measuring up to 4.9 cm in diameter with gradual tapering to normal caliber small bowel in the lower mid abdomen and pelvis. He received 1L NS bolus, IV Zofran, morphine x1, and Percocet.    Review of Systems: A complete ROS was negative except as per HPI.   Meds:  Current Meds  Medication Sig  . albuterol (PROVENTIL HFA;VENTOLIN  HFA) 108 (90 BASE) MCG/ACT inhaler Inhale 1 puff into the lungs every 6 (six) hours as needed for wheezing.   Marland Kitchen alendronate (FOSAMAX) 70 MG tablet Take 70 mg by mouth every Wednesday.  Marland Kitchen aspirin 81 MG chewable tablet Chew 81 mg by mouth daily.  . clobetasol ointment (TEMOVATE) 4.25 % Apply 1 application topically 2 (two) times daily as needed (rash).  . mupirocin cream (BACTROBAN) 2 % Apply topically 2 (two) times daily. Apply to rash on the back (Patient taking differently: Apply 1 application topically 2 (two) times daily as needed. Apply to rash on the back)  . OXYGEN-HELIUM IN Inhale 2 each into the lungs See admin instructions. Inhale 2 liters at bedtime  . tiotropium (SPIRIVA) 18 MCG inhalation capsule Place 18 mcg into inhaler and inhale daily.    Allergies: Allergies as of 04/21/2017  . (No Known Allergies)   Past Medical History:  Diagnosis Date  . Bowel obstruction ([place]) 06/2009-10/01/2013 X 5; 04/03/2016  . COPD (chronic obstructive pulmonary disease) ([place])   . H/O hiatal hernia   . History of stomach ulcers ~ 1960  . On home oxygen therapy    "2L q hs" (04/03/2016)  . Pneumonia 1990s X 1  . Shortness of breath     Family History:  Mother- RA   Social History:  Social History   Tobacco Use  .  Smoking status: Former Smoker    Packs/day: 2.00    Years: 50.00    Pack years: 100.00    Types: Cigarettes    Last attempt to quit: 10/15/2001    Years since quitting: 15.5  . Smokeless tobacco: Never Used  Substance Use Topics  . Alcohol use: Yes    Comment: 04/03/2016 "quit drinking 01/1987"  . Drug use: No    Physical Exam: Blood pressure (!) 113/96, pulse 81, temperature 100 F (37.8 C), temperature source Rectal, resp. rate 15, height 5\' 11"  (1.803 m), weight 152 lb (68.9 kg), SpO2 93 %.  Physical Exam  Constitutional: He is oriented to person, place, and time. He appears well-developed and well-nourished. No distress.  Cardiovascular: Normal rate, regular rhythm,  normal heart sounds and intact distal pulses. Exam reveals no gallop and no friction rub.  No murmur heard. Pulmonary/Chest: Effort normal and breath sounds normal. No respiratory distress. He has no wheezes. He has no rales.  Abdominal: He exhibits distension (Abdomen is distended and tympanic on exam with absent bowel sounds ). There is no tenderness. There is no rebound and no guarding.  Musculoskeletal: Normal range of motion. He exhibits no edema.  Neurological: He is alert and oriented to person, place, and time.    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  Previous episode of SBO in 04/2016 requiring prolonged hospitalization. He has also required surgical management for these in the past.  He presents now with abdominal pain, N/V, and small, water bowel movements consistent with prior history of abscess.  NG tube placement attempted in the ED but unsuccessful.  He has required fluoro-guided NGT placement in the past. Will continue medical management at this time.  - s/p 1L LR bolus, will give another 1L LR bolus  - Fluoro-guided NGT placement - NPO  - LR @ 100cc/hr  - Continue to monitor electrolytes  # AKI: Patient has normal renal function at baseline and is presented with creatinine of 1.42 likely prerenal in the setting of partial SBO with poor p.o. Intake.  IV  hydration as above and continue to monitor renal function. - IVF hydration as above - Continue to monitor renal function - Avoid nephrotoxic agents  # COPD: Patient is on 2L of nasal oxygen at home and takes Spiriva at home.  - Nocturnal supplemental O2 at 2L  - Continue home Spiriva  - Albuterol q6h PRN   F: LR @ 100cc/hr  E: monitoring and repleting PRN  N: NPO   VTE ppx: SQ Lovenox   Code status: Full code, confirmed on admission    Dispo: Admit patient to Inpatient with expected length of stay greater than 2 midnights.  SignedWelford Roche, MD 04/21/2017, 3:20 PM  Pager: [phone]

## 2017-04-22 DIAGNOSIS — Z978 Presence of other specified devices: Secondary | ICD-10-CM

## 2017-04-22 DIAGNOSIS — R109 Unspecified abdominal pain: Secondary | ICD-10-CM

## 2017-04-22 DIAGNOSIS — Z79899 Other long term (current) drug therapy: Secondary | ICD-10-CM

## 2017-04-22 LAB — BASIC METABOLIC PANEL
Anion gap: 10 (ref 5–15)
BUN: 16 mg/dL (ref 6–20)
CO2: 25 mmol/L (ref 22–32)
Calcium: 8.1 mg/dL — ABNORMAL LOW (ref 8.9–10.3)
Chloride: 104 mmol/L (ref 101–111)
Creatinine, Ser: 1.11 mg/dL (ref 0.61–1.24)
GFR calc Af Amer: >60 mL/min (ref >60)
GFR calc non Af Amer: >60 mL/min (ref >60)
Glucose, Bld: 107 mg/dL — ABNORMAL HIGH (ref 65–99)
Potassium: 4.5 mmol/L (ref 3.5–5.1)
Sodium: 139 mmol/L (ref 135–145)

## 2017-04-22 LAB — MAGNESIUM: Magnesium: 1.6 mg/dL — ABNORMAL LOW (ref 1.7–2.4)

## 2017-04-22 LAB — CBC
HCT: 40 % (ref 39.0–52.0)
Hemoglobin: 13.2 g/dL (ref 13.0–17.0)
MCH: 31.4 pg (ref 26.0–34.0)
MCHC: 33 g/dL (ref 30.0–36.0)
MCV: 95 fL (ref 78.0–100.0)
Platelets: 240 10*3/uL (ref 150–400)
RBC: 4.21 MIL/uL — ABNORMAL LOW (ref 4.22–5.81)
RDW: 13.6 % (ref 11.5–15.5)
WBC: 6.3 10*3/uL (ref 4.0–10.5)

## 2017-04-22 MED ORDER — PHENOL 1.4 % MT LIQD: 2.0000 | OROMUCOSAL | Status: DC | PRN

## 2017-04-22 MED ORDER — ORAL CARE MOUTH RINSE
15.0000 mL | Freq: Two times a day (BID) | OROMUCOSAL | Status: DC
  Administered 2017-04-22 – 2017-04-23 (×3): 15 mL via OROMUCOSAL

## 2017-04-22 MED ORDER — CHLORHEXIDINE GLUCONATE 0.12 % MT SOLN
15.0000 mL | Freq: Two times a day (BID) | OROMUCOSAL | Status: DC
  Administered 2017-04-22 – 2017-04-24 (×6): 15 mL via OROMUCOSAL
  Filled 2017-04-22 (×5): qty 15

## 2017-04-22 MED ORDER — MAGNESIUM SULFATE 2 GM/50ML IV SOLN
2.0000 g | Freq: Once | INTRAVENOUS | Status: AC
  Administered 2017-04-22: 2 g via INTRAVENOUS
  Filled 2017-04-22: qty 50

## 2017-04-22 MED ORDER — PHENOL 1.4 % MT LIQD
OROMUCOSAL | Status: AC
  Administered 2017-04-22: 13:00:00
  Filled 2017-04-22: qty 177

## 2017-04-22 MED ORDER — RAMELTEON 8 MG PO TABS
8.0000 mg | ORAL_TABLET | Freq: Every day | ORAL | Status: DC
  Administered 2017-04-22 – 2017-04-24 (×3): 8 mg via ORAL
  Filled 2017-04-22 (×5): qty 1

## 2017-04-22 NOTE — Progress Notes (Signed)
   Subjective:  No acute events overnight. NG tube placed around 7pm yesterday. Reports diarrhea for 3 days prior to admission. Reports he possibly had a fever prior to admission but was not too concerned by this. Denies abdominal pain but endorses soreness. Feels his abdomen is less distended than on admission after placement of NGT. Feels hungry and thirsty. Denies flatus or any BMs while inpatient. Planning to ambulate today - discussed keeping fall risk precautions for now. Reports his symptoms are very similar to his previous SBOs. Discussed continuing medical management for now. Patient voiced understanding and is in agreement with plan. All questions answered.   Objective:  Vital signs in last 24 hours: Vitals:   04/21/17 1500 04/21/17 1614 04/21/17 2128 04/22/17 0614  BP: 113/69  111/64 127/64  Pulse: 77 75 93 84  Resp: 18 16 16 16   Temp: 98 F (36.7 C)  98.4 F (36.9 C) 98.6 F (37 C)  TempSrc: Oral  Oral Oral  SpO2: 96% 95% 94% 95%  Weight: 158 lb 6.4 oz (71.8 kg)     Height: 5\' 10"  (1.778 m)      Physical Exam  Constitutional: He is oriented to person, place, and time. He appears well-developed. No distress.  Cardiovascular: Normal rate, regular rhythm and normal heart sounds. Exam reveals no gallop and no friction rub.  No murmur heard. Pulmonary/Chest: Effort normal. No respiratory distress.  Abdominal:  Abdomen remains distended. Decreased bowel sounds. Non-tender to palpation   Musculoskeletal: He exhibits no edema.  Neurological: He is alert and oriented to person, place, and time.    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  Previous episode of SBO in 04/2016 requiring  prolonged hospitalization and surgical management. Suspect this is secondary to intra-abdominal adhesions but also concern for an infectious process given evidence of enteritis on imaging and patient report of fevers at home. However, he has been afebrile and without a leukocytosis during admission and will therefore hold on antibiotic therapy at this time and continue to monitor fever curve and WBC. Will continue medical management for now.  - NPO  - LR @ 100cc/hr  - Continue to monitor electrolytes and replete PRN  - Monitor fever curve and WBC  - IV Dilaudid 0.5 mg q4h PRN for pain   # AKI: Resolved. Cr 1.1 today.  - IVF hydration as above - Continue to monitor renal function - Avoid nephrotoxic agents  # COPD: Patient is on 2L of nasal oxygen at home and takes Spiriva at home.  - Nocturnal supplemental O2 at 2L  - Continue home Spiriva  - Albuterol q6h PRN   Dispo: Anticipated discharge in approximately 3-5 day(s).   Colbert Ewing, MD 04/22/2017, 8:15 AM Pager: Mamie Nick [phone]

## 2017-04-23 ENCOUNTER — Inpatient Hospital Stay (HOSPITAL_COMMUNITY): Payer: Medicare Other

## 2017-04-23 LAB — CBC
HCT: 36 % — ABNORMAL LOW (ref 39.0–52.0)
Hemoglobin: 11.4 g/dL — ABNORMAL LOW (ref 13.0–17.0)
MCH: 30 pg (ref 26.0–34.0)
MCHC: 31.7 g/dL (ref 30.0–36.0)
MCV: 94.7 fL (ref 78.0–100.0)
Platelets: 207 10*3/uL (ref 150–400)
RBC: 3.8 MIL/uL — ABNORMAL LOW (ref 4.22–5.81)
RDW: 13 % (ref 11.5–15.5)
WBC: 5.8 10*3/uL (ref 4.0–10.5)

## 2017-04-23 LAB — BASIC METABOLIC PANEL
Anion gap: 10 (ref 5–15)
BUN: 11 mg/dL (ref 6–20)
CO2: 25 mmol/L (ref 22–32)
Calcium: 7.6 mg/dL — ABNORMAL LOW (ref 8.9–10.3)
Chloride: 102 mmol/L (ref 101–111)
Creatinine, Ser: 0.88 mg/dL (ref 0.61–1.24)
GFR calc Af Amer: >60 mL/min (ref >60)
GFR calc non Af Amer: >60 mL/min (ref >60)
Glucose, Bld: 87 mg/dL (ref 65–99)
Potassium: 4.1 mmol/L (ref 3.5–5.1)
Sodium: 137 mmol/L (ref 135–145)

## 2017-04-23 LAB — GASTROINTESTINAL PANEL BY PCR, STOOL (REPLACES STOOL CULTURE)

## 2017-04-23 LAB — MAGNESIUM: Magnesium: 2.1 mg/dL (ref 1.7–2.4)

## 2017-04-23 MED ORDER — DIATRIZOATE MEGLUMINE & SODIUM 66-10 % PO SOLN
90.0000 mL | Freq: Once | ORAL | Status: AC
  Administered 2017-04-23: 90 mL via NASOGASTRIC
  Filled 2017-04-23: qty 90

## 2017-04-23 MED ORDER — DIPHENHYDRAMINE HCL 50 MG/ML IJ SOLN: 12.5000 mg | Freq: Every evening | INTRAMUSCULAR | Status: DC | PRN

## 2017-04-23 NOTE — Plan of Care (Signed)
  Progressing Elimination: Will not experience complications related to bowel motility 04/23/2017 1627 - Progressing by Candida Peeling, RN Pain Managment: General experience of comfort will improve 04/23/2017 1627 - Progressing by Candida Peeling, RN

## 2017-04-23 NOTE — Progress Notes (Signed)
   Subjective:  No acute events overnight.  Patient reports feeling better this morning.  Reports passing flatus but no BMs yet.  He does not voice any other complaints this morning.  Discussed with patient NG tube will remain in place given amount of output.  Also discuss will consider surgery consult  if worsening small bowel distention on repeat KUB.  Patient voiced understanding and is in agreement with plan. All questions answered.   Objective:  Vital signs in last 24 hours: Vitals:   04/22/17 0900 04/22/17 1325 04/22/17 2131 04/23/17 0422  BP:  (!) 113/57 140/67 119/68  Pulse: 84 91 92 83  Resp: 16 18 18    Temp:  98.6 F (37 C) 98.8 F (37.1 C) 99 F (37.2 C)  TempSrc:  Oral Oral Oral  SpO2: 97% (!) 88% 96% 97%  Weight:      Height:       Physical Exam  Constitutional: He is oriented to person, place, and time. He appears well-developed and well-nourished. No distress.  Cardiovascular: Normal rate, regular rhythm and normal heart sounds. Exam reveals no gallop and no friction rub.  No murmur heard. Pulmonary/Chest: Effort normal. No respiratory distress.  Abdominal: He exhibits distension. There is no tenderness.  Abdomen is softer than yesterday but remains distended. Decreased bowel sounds.   Musculoskeletal: He exhibits no edema.  Neurological: He is alert and oriented to person, place, and time.   Assessment/Plan:  Principal Problem:   Partial small bowel obstruction (HCC) Active Problems:   COPD (chronic obstructive pulmonary disease) (HCC)   AKI (acute kidney injury) ([place])   Acute abdominal pain  # Partial SBO  # History of recurrent SBOs Patient feeling better today and passing flatus. No BMs during this admission. NGT with 550 cc of bilious output. Will hold on clamping given amount of output. Repeat KUB ordered. Will plan on consulting surgery if worsening in bowel distention. He remains afebrile and without signs/symptoms of infection. Will continue to hold  on antibiotics. GI pathogen panel ordered.  - Follow up KUB and GI pathogen panel  - NPO  - LR @ 100cc/hr  -Continue to monitor electrolytes and replete PRN  - Monitor fever curve and WBC  - IV Dilaudid 0.5 mg q4h PRN for pain   # COPD: Patient is on 2L ofnasal oxygen at homeand takes Spiriva at home. Respiratory status stable.  - Nocturnal supplemental O2 at 2L  - Continue home Spiriva  - Albuterol q6h PRN   Dispo: Anticipated discharge in approximately 2-4 day(s).   Welford Roche, MD 04/23/2017, 9:06 AM Pager: [phone]

## 2017-04-23 NOTE — Consult Note (Signed)
[facility] Surgery Consult Note  Lonnie Schaefer [birth year]  [id number].    Requesting MD: [place] Chief Complaint/Reason for Consult: PSBO  HPI:  Patient is a 77 year old male who presented to [facility] yesterday with a 3-4 day history of abdominal distention, n/v, and had not had a BM in 3 days. Patient has had bowel obstructions in the past, one treated surgically but the most recent times have been treated with the small bowel protocol. Patient currently reports mild abdominal soreness across lower abdomen. Nausea and distention are improved. Passing some flatus, had a very small soft BM a little while ago. Has been ambulating in hall with NGT clamped. Patient PMH significant for COPD on home O2 at night. Does not take any blood thinning medications. Past abdominal surgeries include umbilical hernia repair, exploratory laparotomy for SBO, lap appendectomy, lap chole.   ROS: Review of Systems  Constitutional: Negative for chills and fever.  Respiratory: Negative for shortness of breath and wheezing.   Cardiovascular: Negative for chest pain and palpitations.  Gastrointestinal: Positive for abdominal pain, constipation, diarrhea, nausea and vomiting. Negative for blood in stool and melena.  Genitourinary: Negative for dysuria, frequency and urgency.  All other systems reviewed and are negative.   History reviewed. No pertinent family history.  Past Medical History:  Diagnosis Date  . Bowel obstruction ([place]) 06/2009-10/01/2013 X 5; 04/03/2016  . COPD (chronic obstructive pulmonary disease) ([place])   . H/O hiatal hernia   . History of stomach ulcers ~ 1960  . On home oxygen therapy    "2L q hs" (04/03/2016)  . Pneumonia 1990s X 1  . SBO (small bowel obstruction) ([place]) 04/21/2017  . Shortness of breath     Past Surgical History:  Procedure Laterality Date  . ABDOMINAL EXPLORATION SURGERY  06/2009; 10/2009  . ABDOMINAL SURGERY    . APPENDECTOMY  10/2009  . CHOLECYSTECTOMY N/A 09/06/2016    Procedure: LAPAROSCOPIC CHOLECYSTECTOMY WITH  INTRAOPERATIVE CHOLANGIOGRAM;  Surgeon: Ralene Ok, MD;  Location: [place];  Service: General;  Laterality: N/A;  . HERNIA REPAIR     "naval"  . 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 UMBILICAL HERNIA REPAIR  06/2009   w/mesh    Social History:  reports that he quit smoking about 15 years ago. His smoking use included cigarettes. He has a 100.00 pack-year smoking history. he has never used smokeless tobacco. He reports that he drinks alcohol. He reports that he does not use drugs.  Allergies: No Known Allergies  Medications Prior to Admission  Medication Sig Dispense Refill  . albuterol (PROVENTIL HFA;VENTOLIN HFA) 108 (90 BASE) MCG/ACT inhaler Inhale 1 puff into the lungs every 6 (six) hours as needed for wheezing.     Marland Kitchen alendronate (FOSAMAX) 70 MG tablet Take 70 mg by mouth every Wednesday.  0  . aspirin 81 MG chewable tablet Chew 81 mg by mouth daily.    . clobetasol ointment (TEMOVATE) 8.83 % Apply 1 application topically 2 (two) times daily as needed (rash).    . mupirocin cream (BACTROBAN) 2 % Apply topically 2 (two) times daily. Apply to rash on the back (Patient taking differently: Apply 1 application topically 2 (two) times daily as needed. Apply to rash on the back) 15 g 0  . OXYGEN-HELIUM IN Inhale 2 each into the lungs See admin instructions. Inhale 2 liters at bedtime    . tiotropium (SPIRIVA) 18 MCG inhalation capsule Place 18 mcg into inhaler and  inhale daily.    . ondansetron (ZOFRAN) 4 MG tablet Take 1 tablet (4 mg total) by mouth every 6 (six) hours. (Patient not taking: Reported on 09/05/2016) 12 tablet 0    Blood pressure 119/68, pulse 80, temperature 99 F (37.2 C), temperature source Oral, resp. rate 18, height _0  (1.778 m), weight 71.8 kg (158 lb 6.4 oz), SpO2 97 %. Physical Exam: Physical Exam  Constitutional: He is oriented to person, place, and time. He appears well-developed. He  appears cachectic. He is cooperative.  Non-toxic appearance. No distress.  HENT:  Head: Normocephalic and atraumatic.  Right Ear: External ear normal.  Left Ear: External ear normal.  Nose: Nose normal.  Mouth/Throat: Mucous membranes are normal.  Eyes: Conjunctivae, EOM and lids are normal. No scleral icterus.  Pupils equal and round  Neck: Normal range of motion and phonation normal. Neck supple.  Cardiovascular: Normal rate and regular rhythm.  Pulses:      Radial pulses are 2+ on the right side, and 2+ on the left side.       Dorsalis pedis pulses are 2+ on the right side, and 2+ on the left side.  No LE edema bilaterally   Pulmonary/Chest: Effort normal and breath sounds normal.  Abdominal: Soft. Bowel sounds are normal. He exhibits no distension. There is no hepatosplenomegaly. There is tenderness (mild) in the periumbilical area and suprapubic area. There is no rigidity, no rebound and no guarding.  Musculoskeletal:  ROM grossly intact in bilateral upper and lower extremities  Neurological: He is alert and oriented to person, place, and time. He has normal strength. No sensory deficit.  Skin: Skin is warm, dry and intact. No rash noted.  Psychiatric: He has a normal mood and affect. His speech is normal and behavior is normal.    Results for orders placed or performed during the hospital encounter of 04/21/17 (from the past 48 hour(s))  Basic metabolic panel     Status: Abnormal   Collection Time: 04/22/17  4:38 AM  Result Value Ref Range   Sodium 139 135 - 145 mmol/L   Potassium 4.5 3.5 - 5.1 mmol/L   Chloride 104 101 - 111 mmol/L   CO2 25 22 - 32 mmol/L   Glucose, Bld 107 (H) 65 - 99 mg/dL   BUN 16 6 - 20 mg/dL   Creatinine, Ser 1.11 0.61 - 1.24 mg/dL   Calcium 8.1 (L) 8.9 - 10.3 mg/dL   GFR calc non Af Amer >60 >60 mL/min   GFR calc Af Amer >60 >60 mL/min    Comment: (NOTE) The eGFR has been calculated using the CKD EPI equation. This calculation has not been  validated in all clinical situations. eGFR's persistently <60 mL/min signify possible Chronic Kidney Disease.    Anion gap 10 5 - 15    Comment: Performed at [facility] [address], [place] [address]  CBC     Status: Abnormal   Collection Time: 04/22/17  4:38 AM  Result Value Ref Range   WBC 6.3 4.0 - 10.5 K/uL   RBC 4.21 (L) 4.22 - 5.81 MIL/uL   Hemoglobin 13.2 13.0 - 17.0 g/dL   HCT 40.0 39.0 - 52.0 %   MCV 95.0 78.0 - 100.0 fL   MCH 31.4 26.0 - 34.0 pg   MCHC 33.0 30.0 - 36.0 g/dL   RDW 13.6 11.5 - 15.5 %   Platelets 240 150 - 400 K/uL    Comment: Performed at [facility], [place] [address], [place], [place] [address]  Magnesium     Status: Abnormal   Collection Time: 04/22/17  4:38 AM  Result Value Ref Range   Magnesium 1.6 (L) 1.7 - 2.4 mg/dL    Comment: Performed at [facility] [address], [place], [place] [address]  Magnesium     Status: None   Collection Time: 04/23/17  5:26 AM  Result Value Ref Range   Magnesium 2.1 1.7 - 2.4 mg/dL    Comment: Performed at [facility] [address], [place], [place] [address]  Basic metabolic panel     Status: Abnormal   Collection Time: 04/23/17  5:26 AM  Result Value Ref Range   Sodium 137 135 - 145 mmol/L   Potassium 4.1 3.5 - 5.1 mmol/L   Chloride 102 101 - 111 mmol/L   CO2 25 22 - 32 mmol/L   Glucose, Bld 87 65 - 99 mg/dL   BUN 11 6 - 20 mg/dL   Creatinine, Ser 0.88 0.61 - 1.24 mg/dL   Calcium 7.6 (L) 8.9 - 10.3 mg/dL   GFR calc non Af Amer >60 >60 mL/min   GFR calc Af Amer >60 >60 mL/min    Comment: (NOTE) The eGFR has been calculated using the CKD EPI equation. This calculation has not been validated in all clinical situations. eGFR's persistently <60 mL/min signify possible Chronic Kidney Disease.    Anion gap 10 5 - 15    Comment: Performed at [facility] [address], [place], [place] [address]  CBC     Status: Abnormal   Collection Time: 04/23/17  5:26 AM   Result Value Ref Range   WBC 5.8 4.0 - 10.5 K/uL   RBC 3.80 (L) 4.22 - 5.81 MIL/uL   Hemoglobin 11.4 (L) 13.0 - 17.0 g/dL   HCT 36.0 (L) 39.0 - 52.0 %   MCV 94.7 78.0 - 100.0 fL   MCH 30.0 26.0 - 34.0 pg   MCHC 31.7 30.0 - 36.0 g/dL   RDW 13.0 11.5 - 15.5 %   Platelets 207 150 - 400 K/uL    Comment: Performed at [facility], [place] [address] [place], [place] [address]   Dg Abd 1 View  Result Date: 04/23/2017 CLINICAL DATA:  Abdominal pain for several days EXAM: ABDOMEN - 1 VIEW COMPARISON:  04/21/2017 FINDINGS: Nasogastric catheter remains in the second portion of the duodenum. Some small bowel dilatation persists although the overall appearance has improved somewhat in the interval from the prior exam. No free air is seen. Scattered colonic gas is noted as well. IMPRESSION: Persistent but improved small bowel dilatation. Electronically Signed   By: Inez Catalina M.D.   On: 04/23/2017 09:17   Dg Abd 1 View  Result Date: 04/21/2017 CLINICAL DATA:  Nasogastric tube placement under fluoroscopy EXAM: ABDOMEN - 1 VIEW COMPARISON:  None. FINDINGS: Nasogastric tube appears well positioned in the stomach with tip directed towards the pylorus/duodenal bulb. Distended gas-filled loops of small bowel are proceed within the upper abdomen, incompletely imaged, compatible with the small-bowel dilatation demonstrated on CT earlier today. 48 seconds of fluoroscopy was used for the procedure. IMPRESSION: Nasogastric tube appears well positioned in the stomach with tip directed towards the stomach pylorus/duodenal bulb. Electronically Signed   By: Franki Cabot M.D.   On: 04/21/2017 18:31      Assessment/Plan PSBO - CT 3/18: dilated proximal to mid small bowel, enteritis vs ileus -  clinically improving with NGT decompression - KUB this AM with persistent but improved small bowel dilatation  - SBO protocol to start today  FEN: NPO, IVF; NGT to LIWS VTE: SCDs, lovenox ID: no abx, afeb, WBC  WNL  Brigid Re, [facility] Surgery 04/23/2017, 1:57 PM Pager: [phone] Consults: [phone] Mon-Fri 7:00 am-4:30 pm Sat-Sun 7:00 am-11:30 am

## 2017-04-24 ENCOUNTER — Inpatient Hospital Stay (HOSPITAL_COMMUNITY): Payer: Medicare Other

## 2017-04-24 DIAGNOSIS — Z9889 Other specified postprocedural states: Secondary | ICD-10-CM

## 2017-04-24 LAB — BASIC METABOLIC PANEL
Anion gap: 12 (ref 5–15)
BUN: 7 mg/dL (ref 6–20)
CO2: 25 mmol/L (ref 22–32)
Calcium: 7.8 mg/dL — ABNORMAL LOW (ref 8.9–10.3)
Chloride: 102 mmol/L (ref 101–111)
Creatinine, Ser: 0.83 mg/dL (ref 0.61–1.24)
GFR calc Af Amer: >60 mL/min (ref >60)
GFR calc non Af Amer: >60 mL/min (ref >60)
Glucose, Bld: 83 mg/dL (ref 65–99)
Potassium: 3.7 mmol/L (ref 3.5–5.1)
Sodium: 139 mmol/L (ref 135–145)

## 2017-04-24 LAB — CBC
HCT: 35 % — ABNORMAL LOW (ref 39.0–52.0)
Hemoglobin: 11.6 g/dL — ABNORMAL LOW (ref 13.0–17.0)
MCH: 30.9 pg (ref 26.0–34.0)
MCHC: 33.1 g/dL (ref 30.0–36.0)
MCV: 93.3 fL (ref 78.0–100.0)
Platelets: 220 10*3/uL (ref 150–400)
RBC: 3.75 MIL/uL — ABNORMAL LOW (ref 4.22–5.81)
RDW: 13.1 % (ref 11.5–15.5)
WBC: 6.1 10*3/uL (ref 4.0–10.5)

## 2017-04-24 LAB — MAGNESIUM: Magnesium: 1.9 mg/dL (ref 1.7–2.4)

## 2017-04-24 NOTE — Plan of Care (Signed)
  Problem: Health Behavior/Discharge Planning: Goal: Ability to manage health-related needs will improve Outcome: Progressing   Problem: Clinical Measurements: Goal: Ability to maintain clinical measurements within normal limits will improve Outcome: Progressing Goal: Will remain free from infection Outcome: Progressing   

## 2017-04-24 NOTE — Progress Notes (Signed)
   Subjective:  No acute events overnight.  Patient passing gas and had 3 BMs yesterday. NGT has been removed and he was about to eat breakfast when seen by the team. Continues to complain of abdominal soreness. Discussed with patient we will continue to monitor for GI symptoms and advance his diet as tolerated. Patient verbalized understanding and is in agreement with plan.  All questions answered.   Objective:  Vital signs in last 24 hours: Vitals:   04/23/17 1100 04/23/17 1422 04/23/17 2052 04/24/17 0423  BP:  121/62 130/62 115/79  Pulse: 80 78 80 73  Resp:  18 18 16   Temp:  98.6 F (37 C) 98.3 F (36.8 C) 97.9 F (36.6 C)  TempSrc:  Oral Oral Oral  SpO2:  96% 95% 97%  Weight:      Height:       Physical Exam  Constitutional: He is oriented to person, place, and time. He appears well-developed and well-nourished. No distress.  Cardiovascular: Normal rate, regular rhythm and normal heart sounds. Exam reveals no gallop and no friction rub.  No murmur heard. Pulmonary/Chest: Effort normal. No respiratory distress.  Appears comfortable on 2L of supplemental oxygen  Abdominal: Soft. Bowel sounds are normal. He exhibits distension (Mild distention ).  Musculoskeletal: He exhibits no edema.  Neurological: He is alert and oriented to person, place, and time.    Assessment/Plan:  Principal Problem:   Partial small bowel obstruction (HCC) Active Problems:   COPD (chronic obstructive pulmonary disease) (HCC)   AKI (acute kidney injury) ([place])   Acute abdominal pain  # Partial SBO  # History of recurrent SBOs Patient is doing remarkably well. Had 3 BMs yesterday that he described as diarrhea. GI pathogen panel negative. Repeat KUB with contrast in colon and decrease bowel dilation. NGT removed this morning and patient was started on clear liquids. Suspect SBO secondary to intra-abdominal adhesions vs infectious etiology given negative GI pathogen panel. We will continue to monitor  him and advance diet as tolerated.  - Surgery following, appreciate assistance  - Advance diet as tolerated  - Encourage ambulation  -Continue to monitor electrolytes and replete PRN  - D/c IV Dailudid for pain as patient has not used it for the past 2 days   # COPD: Patient is on 2L ofnasal oxygen at homeand takes Spiriva at home. Respiratory status stable.  - Nocturnal supplemental O2 at 2L  - Continue home Spiriva  - Albuterol q6h PRN   Dispo: Anticipated discharge in approximately 2-4 day(s).   Welford Roche, MD 04/24/2017, 6:40 AM Pager: [phone]

## 2017-04-24 NOTE — Progress Notes (Signed)
[facility] Progress Note     Subjective: CC-  Patient states that he feels much better today. He had 3 large BMs since yesterday. Passing flatus this morning. States that his abdomen is a little sore but no pain. Distension improved. Denies n/v.  Objective: Vital signs in last 24 hours: Temp:  [97.9 F (36.6 C)-98.6 F (37 C)] 97.9 F (36.6 C) (03/21 0423) Pulse Rate:  [73-80] 73 (03/21 0423) Resp:  [16-18] 16 (03/21 0423) BP: (115-130)/(62-79) 115/79 (03/21 0423) SpO2:  [94 %-97 %] 94 % (03/21 0749) Last BM Date: 04/24/17  Intake/Output from previous day: 03/20 0701 - 03/21 0700 In: 2355 [I.V.:2355] Out: 1100 [Emesis/NG output:1100] Intake/Output this shift: No intake/output data recorded.  PE: Gen:  Alert, NAD, pleasant HEENT: EOM's intact, pupils equal and round Pulm:  effort normal Abd: Soft, mild distension, NT, +BS in all 4 quadrants, no hernia or HSM Ext:  Calves soft and nontender Psych: A&Ox3  Skin: no rashes noted, warm and dry  Lab Results:  Recent Labs    04/23/17 0526 04/24/17 0647  WBC 5.8 6.1  HGB 11.4* 11.6*  HCT 36.0* 35.0*  PLT 207 220   BMET Recent Labs    04/23/17 0526 04/24/17 0647  NA 137 139  K 4.1 3.7  CL 102 102  CO2 25 25  GLUCOSE 87 83  BUN 11 7  CREATININE 0.88 0.83  CALCIUM 7.6* 7.8*   PT/INR No results for input(s): LABPROT, INR in the last 72 hours. CMP     Component Value Date/Time   NA 139 04/24/2017 0647   K 3.7 04/24/2017 0647   CL 102 04/24/2017 0647   CO2 25 04/24/2017 0647   GLUCOSE 83 04/24/2017 0647   BUN 7 04/24/2017 0647   CREATININE 0.83 04/24/2017 0647   CALCIUM 7.8 (L) 04/24/2017 0647   PROT 7.6 04/21/2017 1019   ALBUMIN 3.5 04/21/2017 1019   AST 21 04/21/2017 1019   ALT 13 (L) 04/21/2017 1019   ALKPHOS 60 04/21/2017 1019   BILITOT 1.3 (H) 04/21/2017 1019   GFRNONAA >60 04/24/2017 0647   GFRAA >60 04/24/2017 0647   Lipase     Component Value Date/Time   LIPASE 24  04/21/2017 1019       Studies/Results: Dg Abd 1 View  Result Date: 04/23/2017 CLINICAL DATA:  Abdominal pain for several days EXAM: ABDOMEN - 1 VIEW COMPARISON:  04/21/2017 FINDINGS: Nasogastric catheter remains in the second portion of the duodenum. Some small bowel dilatation persists although the overall appearance has improved somewhat in the interval from the prior exam. No free air is seen. Scattered colonic gas is noted as well. IMPRESSION: Persistent but improved small bowel dilatation. Electronically Signed   By: Inez Catalina M.D.   On: 04/23/2017 09:17   Dg Abd Portable 1v-small Bowel Obstruction Protocol-initial, 8 Hr Delay  Result Date: 04/24/2017 CLINICAL DATA:  Small bowel obstruction.  8 hour delayed film. EXAM: PORTABLE ABDOMEN - 1 VIEW COMPARISON:  Radiograph earlier this day.  CT 04/21/2017 FINDINGS: Enteric tube in place with tip in the duodenum in side-port in the region of the pylorus. Administered enteric contrast in the ascending, transverse, descending and rectosigmoid colon. Slight decrease gaseous distention of central small bowel from prior, persistent small bowel dilatation of 3.8 cm centrally. No free air. Lung bases are clear. IMPRESSION: 1. Administered enteric contrast throughout the entire colon. 2. Improved small bowel dilatation with mild persistent gaseous distention centrally. Electronically Signed   By: Threasa Beards  Ehinger M.D.   On: 04/24/2017 00:35    Anti-infectives: Anti-infectives (From admission, onward)   None       Assessment/Plan COPD H/o recurrent SBOs  PSBO - CT 3/18: dilated proximal to mid small bowel, enteritis vs ileus - started SB protocol 3/20 - XR today shows contrast in the colon and decreased bowel dilatation - NG tube with 1100cc/24 hours - BM x3  ID - none FEN - IVF, clear liquids VTE - SCDs, lovenox Foley - none Follow up - TBD  Plan - D/c NG tube and advance to clear liquids. Encourage OOB and ambulation.   LOS: 3  days    Wellington Hampshire , [facility] 04/24/2017, 9:01 AM Pager: [phone] Consults: [phone] Mon-Fri 7:00 am-4:30 pm Sat-Sun 7:00 am-11:30 am

## 2017-04-25 LAB — CBC
HCT: 36.5 % — ABNORMAL LOW (ref 39.0–52.0)
Hemoglobin: 11.9 g/dL — ABNORMAL LOW (ref 13.0–17.0)
MCH: 29.9 pg (ref 26.0–34.0)
MCHC: 32.6 g/dL (ref 30.0–36.0)
MCV: 91.7 fL (ref 78.0–100.0)
Platelets: 243 10*3/uL (ref 150–400)
RBC: 3.98 MIL/uL — ABNORMAL LOW (ref 4.22–5.81)
RDW: 12.7 % (ref 11.5–15.5)
WBC: 7 10*3/uL (ref 4.0–10.5)

## 2017-04-25 LAB — BASIC METABOLIC PANEL
Anion gap: 10 (ref 5–15)
BUN: <5 mg/dL — ABNORMAL LOW (ref 6–20)
CO2: 26 mmol/L (ref 22–32)
Calcium: 8.2 mg/dL — ABNORMAL LOW (ref 8.9–10.3)
Chloride: 103 mmol/L (ref 101–111)
Creatinine, Ser: 0.79 mg/dL (ref 0.61–1.24)
GFR calc Af Amer: >60 mL/min (ref >60)
GFR calc non Af Amer: >60 mL/min (ref >60)
Glucose, Bld: 94 mg/dL (ref 65–99)
Potassium: 4.4 mmol/L (ref 3.5–5.1)
Sodium: 139 mmol/L (ref 135–145)

## 2017-04-25 LAB — MAGNESIUM: Magnesium: 1.7 mg/dL (ref 1.7–2.4)

## 2017-04-25 MED ORDER — MAGNESIUM SULFATE 2 GM/50ML IV SOLN
2.0000 g | Freq: Once | INTRAVENOUS | Status: AC
  Administered 2017-04-25: 2 g via INTRAVENOUS
  Filled 2017-04-25: qty 50

## 2017-04-25 NOTE — Progress Notes (Signed)
[facility]/Trauma Progress Note      Assessment/Plan COPD H/o recurrent SBOs  PSBO - CT 3/18: dilated proximal to mid small bowel, enteritis vs ileus - started SB protocol 3/20 - XR 03/21 shows contrast in the colon and decreased bowel dilatation - NG tube DC'd 03/21  ID - none FEN - IVF, full liquids, advance as tolerated to soft VTE - SCDs, lovenox Foley - none Follow up - TBD  Plan - advance to full liquids then soft as tolerated. Encourage OOB and ambulation. Pt is having bowel function. Surgery will sign off at this time. Please page [place] with any further needs for this patient.    LOS: 4 days    Subjective: CC: abdominal soreness  Pt states he feels good and wants food. He denies nausea or vomiting. Having flatus and BM's. No new complaints.   Objective: Vital signs in last 24 hours: Temp:  [97.8 F (36.6 C)-98.5 F (36.9 C)] 98.1 F (36.7 C) (03/22 0438) Pulse Rate:  [65-70] 65 (03/22 0438) Resp:  [16] 16 (03/22 0438) BP: (110-120)/(45-61) 110/61 (03/22 0438) SpO2:  [93 %-95 %] 95 % (03/22 0438) Last BM Date: 04/24/17  Intake/Output from previous day: 03/21 0701 - 03/22 0700 In: 3893.3 [P.O.:1780; I.V.:2113.3] Out: -  Intake/Output this shift: No intake/output data recorded.  PE: Gen:  Alert, NAD, pleasant HEENT: pupils equal and round Pulm:  rate and effort normal Abd: Soft, no distension, +BS, no hernia or HSM Psych: A&Ox3  Skin: no rashes noted, warm and dry   Anti-infectives: Anti-infectives (From admission, onward)   None      Lab Results:  Recent Labs    04/24/17 0647 04/25/17 0437  WBC 6.1 7.0  HGB 11.6* 11.9*  HCT 35.0* 36.5*  PLT 220 243   BMET Recent Labs    04/24/17 0647 04/25/17 0437  NA 139 139  K 3.7 4.4  CL 102 103  CO2 25 26  GLUCOSE 83 94  BUN 7 <5*  CREATININE 0.83 0.79  CALCIUM 7.8* 8.2*   PT/INR No results for input(s): LABPROT, INR in the last 72 hours. CMP     Component Value  Date/Time   NA 139 04/25/2017 0437   K 4.4 04/25/2017 0437   CL 103 04/25/2017 0437   CO2 26 04/25/2017 0437   GLUCOSE 94 04/25/2017 0437   BUN <5 (L) 04/25/2017 0437   CREATININE 0.79 04/25/2017 0437   CALCIUM 8.2 (L) 04/25/2017 0437   PROT 7.6 04/21/2017 1019   ALBUMIN 3.5 04/21/2017 1019   AST 21 04/21/2017 1019   ALT 13 (L) 04/21/2017 1019   ALKPHOS 60 04/21/2017 1019   BILITOT 1.3 (H) 04/21/2017 1019   GFRNONAA >60 04/25/2017 0437   GFRAA >60 04/25/2017 0437   Lipase     Component Value Date/Time   LIPASE 24 04/21/2017 1019    Studies/Results: Dg Abd Portable 1v-small Bowel Obstruction Protocol-24 Hr Delay  Result Date: 04/24/2017 CLINICAL DATA:  Small bowel obstruction EXAM: PORTABLE ABDOMEN - 1 VIEW COMPARISON:  04/23/2017 FINDINGS: NG tube with tip in the proximal duodenum. No evidence of bowel obstruction. IMPRESSION: NG tube with tip in the duodenum.  No bowel obstruction. Electronically Signed   By: Suzy Bouchard M.D.   On: 04/24/2017 11:36   Dg Abd Portable 1v-small Bowel Obstruction Protocol-initial, 8 Hr Delay  Result Date: 04/24/2017 CLINICAL DATA:  Small bowel obstruction.  8 hour delayed film. EXAM: PORTABLE ABDOMEN - 1 VIEW COMPARISON:  Radiograph earlier this day.  CT 04/21/2017 FINDINGS: Enteric tube in place with tip in the duodenum in side-port in the region of the pylorus. Administered enteric contrast in the ascending, transverse, descending and rectosigmoid colon. Slight decrease gaseous distention of central small bowel from prior, persistent small bowel dilatation of 3.8 cm centrally. No free air. Lung bases are clear. IMPRESSION: 1. Administered enteric contrast throughout the entire colon. 2. Improved small bowel dilatation with mild persistent gaseous distention centrally. Electronically Signed   By: Jeb Levering M.D.   On: 04/24/2017 00:35      Kalman Drape , [facility] 04/25/2017, 8:09 AM  Pager:  [phone] Mon-Wed, Friday 7:00am-4:30pm Thurs 7am-11:30am  Consults: [phone]

## 2017-04-25 NOTE — Progress Notes (Signed)
Discharge paperwork reviewed with patient and patient's daughter. No questions verbalized. Patient is ready to be discharged.

## 2017-04-25 NOTE — Discharge Summary (Signed)
Name: Lonnie Schaefer MRN: [id number] DOB: 20-Jun-1940 77 y.o. PCP: Lonnie Schaefer, [place]  Date of Admission: 04/21/2017 11:14 AM Date of Discharge: 04/25/2017 Attending Physician: Lucious Groves, DO  Discharge Diagnosis: 1. Partial SBO  2. COPD  3. AKI   Principal Problem:   Partial small bowel obstruction (HCC) Active Problems:   COPD (chronic obstructive pulmonary disease) (HCC)   AKI (acute kidney injury) ([place])   Acute abdominal pain   Discharge Medications: Allergies as of 04/25/2017   No Known Allergies     Medication List    TAKE these medications   albuterol 108 (90 Base) MCG/ACT inhaler Commonly known as:  PROVENTIL HFA;VENTOLIN HFA Inhale 1 puff into the lungs every 6 (six) hours as needed for wheezing.   alendronate 70 MG tablet Commonly known as:  FOSAMAX Take 70 mg by mouth every Wednesday.   aspirin 81 MG chewable tablet Chew 81 mg by mouth daily.   clobetasol ointment 0.05 % Commonly known as:  TEMOVATE Apply 1 application topically 2 (two) times daily as needed (rash).   mupirocin cream 2 % Commonly known as:  BACTROBAN Apply topically 2 (two) times daily. Apply to rash on the back What changed:    how much to take  when to take this  reasons to take this  additional instructions   ondansetron 4 MG tablet Commonly known as:  ZOFRAN Take 1 tablet (4 mg total) by mouth every 6 (six) hours.   OXYGEN Inhale 2 each into the lungs See admin instructions. Inhale 2 liters at bedtime   tiotropium 18 MCG inhalation capsule Commonly known as:  SPIRIVA Place 18 mcg into inhaler and inhale daily.       Disposition and follow-up:   Mr.Lonnie Schaefer was discharged from [facility] in Stable condition.  At the hospital follow up visit please address:  1.  Please assess for ongoing GI symptoms including abdominal pain, nausea, vomiting, constipation/diarrhea, and poor PO intake.  2.  Labs / imaging needed at time of follow-up:  BMP to check potassium and creatinine, and Magnesium   3.  Pending labs/ test needing follow-up: None   Follow-up Appointments: Follow-up Information    Lonnie Aly, FNP Follow up.   Specialty:  Nurse Practitioner Why:  PLease make a hospital follow up appointment with your primary care doctor within the next 1-2 weeks.  Contact information: [place] [id number] [phone]           Hospital Course by problem list: Principal Problem:   Partial small bowel obstruction ([place]) Active Problems:   COPD (chronic obstructive pulmonary disease) (HCC)   AKI (acute kidney injury) ([place])   Acute abdominal pain   1. Partial SBO: Patient presented with abdominal pain, N/ V, and diarrhea.  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  CT abdomen/pelvis showed dilated proximal small bowel as well as small bowel wall thickening suggestive of an infectious process (see report below).  He has been seen by GI as an outpatient who believe recurrent SBOs are secondary to intra-abdominal adhesions and not an infectious process.  GI pathogen panel during his admission was negative and he was not started on antibiotic therapy.  He was managed conservatively with NG tube placement, bowel rest, IVF hydration, and electrolyte repletion.  Surgery was consulted given previous history of need for surgical management for previous SBOs episode.  However patient responded to medical management and asked several bowel movements prior to the day of discharge.  He was advised to continue to advance his diet slowly at home.  2. COPD: Patient respiratory status was stable during this admission.  He was continued on his home inhalers as well as on supplemental oxygen therapy which she is on at home.  3. AKI: Patient has normal renal function at baseline and presented with a creatinine of 1.42 that  was thought to be irritable p.o. intake.  This resolved after IVF hydration and his creatinine was 0.7 on day of discharge.  Discharge Vitals:   BP 130/61 (BP Location: Left Arm)   Pulse 71   Temp 97.8 F (36.6 C) (Oral)   Resp 16   Ht 5\' 10"  (1.778 m)   Wt 158 lb 6.4 oz (71.8 kg)   SpO2 92%   BMI 22.73 kg/m   Pertinent Labs, Studies, and Procedures:  CBC Latest Ref Rng & Units 04/25/2017 04/24/2017 04/23/2017  WBC 4.0 - 10.5 K/uL 7.0 6.1 5.8  Hemoglobin 13.0 - 17.0 g/dL 11.9(L) 11.6(L) 11.4(L)  Hematocrit 39.0 - 52.0 % 36.5(L) 35.0(L) 36.0(L)  Platelets 150 - 400 K/uL 243 220 207   BMP Latest Ref Rng & Units 04/25/2017 04/24/2017 04/23/2017  Glucose 65 - 99 mg/dL 94 83 87  BUN 6 - 20 mg/dL <5(L) 7 11  Creatinine 0.61 - 1.24 mg/dL 0.79 0.83 0.88  Sodium 135 - 145 mmol/L 139 139 137  Potassium 3.5 - 5.1 mmol/L 4.4 3.7 4.1  Chloride 101 - 111 mmol/L 103 102 102  CO2 22 - 32 mmol/L 26 25 25   Calcium 8.9 - 10.3 mg/dL 8.2(L) 7.8(L) 7.6(L)   CT abdomen/pelvis 3/18:  IMPRESSION: Dilated proximal to mid small bowel measuring up to 4.9 cm in diameter with gradual tapering to normal caliber small bowel in the lower mid abdomen and pelvis where there is small bowel wall thickening suggesting a regional enteritis of infectious or inflammatory nature likely with secondary ileus. No free peritoneal air or free fluid. Recommend serial follow-up imaging.  1.3 cm left renal cyst unchanged.  Colonic diverticulosis.  Small umbilical hernia without significant change.  Aortic Atherosclerosis   KUB 3/18: FINDINGS: Nasogastric tube appears well positioned in the stomach with tip directed towards the pylorus/duodenal bulb. Distended gas-filled loops of small bowel are proceed within the upper abdomen, incompletely imaged, compatible with the small-bowel dilatation demonstrated on CT earlier today. 48 seconds of fluoroscopy was used for the procedure.  KUB 3/20: FINDINGS: Nasogastric  catheter remains in the second portion of the duodenum. Some small bowel dilatation persists although the overall appearance has improved somewhat in the interval from the prior exam. No free air is seen. Scattered colonic gas is noted as well.  KUB 3/21: FINDINGS: NG tube with tip in the proximal duodenum. No evidence of bowel Obstruction.   Discharge Instructions: Discharge Instructions    Call MD for:  persistant nausea and vomiting   Complete by:  As directed    Call MD for:  severe uncontrolled pain   Complete by:  As directed    Diet general   Complete by:  As directed    Discharge instructions   Complete by:  As directed    Mr. Numair, [place]   You were admitted to the hospital with an obstruction in your intestines that got better with bowel rest and a nasogastric tube. Continue to advance your diet slowly at home. Please schedule a follow up  appointment with your regular doctor within the next 1-2 weeks to make sure you continue to do well.   Please call us if you have any questions.   Increase activity slowly   Complete by:  As directed       Signed: Welford Roche, MD 04/26/2017, 6:51 PM   Pager: [phone]

## 2017-04-25 NOTE — Progress Notes (Signed)
   Subjective:  No acute events overnight. Patient continues to do well. Reports ongoing abdominal soreness. No N/V. Had 5 loose stools yesterday. Tolerating CL diet. Discussed with patient will advance diet today and anticipate discharge home if able to tolerate soft diet later today. Patient verbalized understanding and is in agreement with plan. All questions answered.   Objective:  Vital signs in last 24 hours: Vitals:   04/24/17 0749 04/24/17 1408 04/24/17 2116 04/25/17 0438  BP:  120/60 (!) 110/45 110/61  Pulse:  69 70 65  Resp:  16 16 16   Temp:  97.8 F (36.6 C) 98.5 F (36.9 C) 98.1 F (36.7 C)  TempSrc:  Oral Oral Oral  SpO2: 94% 93% 94% 95%  Weight:      Height:       Physical Exam  Constitutional: He is oriented to person, place, and time. He appears well-developed and well-nourished. No distress.  Cardiovascular: Normal rate, regular rhythm and normal heart sounds. Exam reveals no gallop and no friction rub.  No murmur heard. Pulmonary/Chest: Effort normal.  Abdominal: He exhibits no distension. There is no tenderness.  Abdomen is soft and non-tender. Normoactive bowel sounds.   Musculoskeletal: He exhibits no edema.  Neurological: He is alert and oriented to person, place, and time.   Assessment/Plan:  Principal Problem:   Partial small bowel obstruction (HCC) Active Problems:   COPD (chronic obstructive pulmonary disease) (HCC)   AKI (acute kidney injury) ([place])   Acute abdominal pain  # Partial SBO  # History of recurrent SBOs Resolved. Patient is doing remarkably well. Had 5 BMs yesterday that he described as loose stools. Continues to have abdominal soreness, but denies N/V and is tolerating CL diet. Will advance diet today and anticipate discharge home today if able to tolerate soft diet.  - Surgery following, appreciate assistance  - Advance diet as tolerated  - Encourage ambulation  -Continue to monitor electrolytesand replete PRN   # COPD: Patient  is on 2L ofnasal oxygen at homeand takes Spiriva at home. Respiratory status stable.  - Nocturnal supplemental O2 at 2L  - Continue home Spiriva  - Albuterol q6h PRN  Dispo: Anticipated discharge in approximately today-1 day(s).   Welford Roche, MD 04/25/2017, 7:01 AM Pager: [phone]

## 2017-04-30 DIAGNOSIS — Z8719 Personal history of other diseases of the digestive system: Secondary | ICD-10-CM | POA: Diagnosis not present

## 2017-04-30 DIAGNOSIS — J449 Chronic obstructive pulmonary disease, unspecified: Secondary | ICD-10-CM | POA: Diagnosis not present

## 2017-05-29 DIAGNOSIS — Z85819 Personal history of malignant neoplasm of unspecified site of lip, oral cavity, and pharynx: Secondary | ICD-10-CM | POA: Diagnosis not present

## 2017-09-22 DIAGNOSIS — H6123 Impacted cerumen, bilateral: Secondary | ICD-10-CM | POA: Diagnosis not present

## 2017-11-17 DIAGNOSIS — J449 Chronic obstructive pulmonary disease, unspecified: Secondary | ICD-10-CM | POA: Diagnosis not present

## 2017-12-10 DIAGNOSIS — Z0000 Encounter for general adult medical examination without abnormal findings: Secondary | ICD-10-CM | POA: Diagnosis not present

## 2017-12-10 DIAGNOSIS — R7301 Impaired fasting glucose: Secondary | ICD-10-CM | POA: Diagnosis not present

## 2017-12-10 DIAGNOSIS — R195 Other fecal abnormalities: Secondary | ICD-10-CM | POA: Diagnosis not present

## 2017-12-10 DIAGNOSIS — J449 Chronic obstructive pulmonary disease, unspecified: Secondary | ICD-10-CM | POA: Diagnosis not present

## 2017-12-10 DIAGNOSIS — Z125 Encounter for screening for malignant neoplasm of prostate: Secondary | ICD-10-CM | POA: Diagnosis not present

## 2017-12-16 DIAGNOSIS — R195 Other fecal abnormalities: Secondary | ICD-10-CM | POA: Diagnosis not present

## 2017-12-18 DIAGNOSIS — J449 Chronic obstructive pulmonary disease, unspecified: Secondary | ICD-10-CM | POA: Diagnosis not present

## 2017-12-19 IMAGING — CR DG ABDOMEN 2V
2 series · 2 of 2 positions shown · non-contrast
Comparison: 04/03/2016

CLINICAL DATA: Small bowel obstruction

EXAM:
ABDOMEN - 2 VIEW

[abdomen erect]
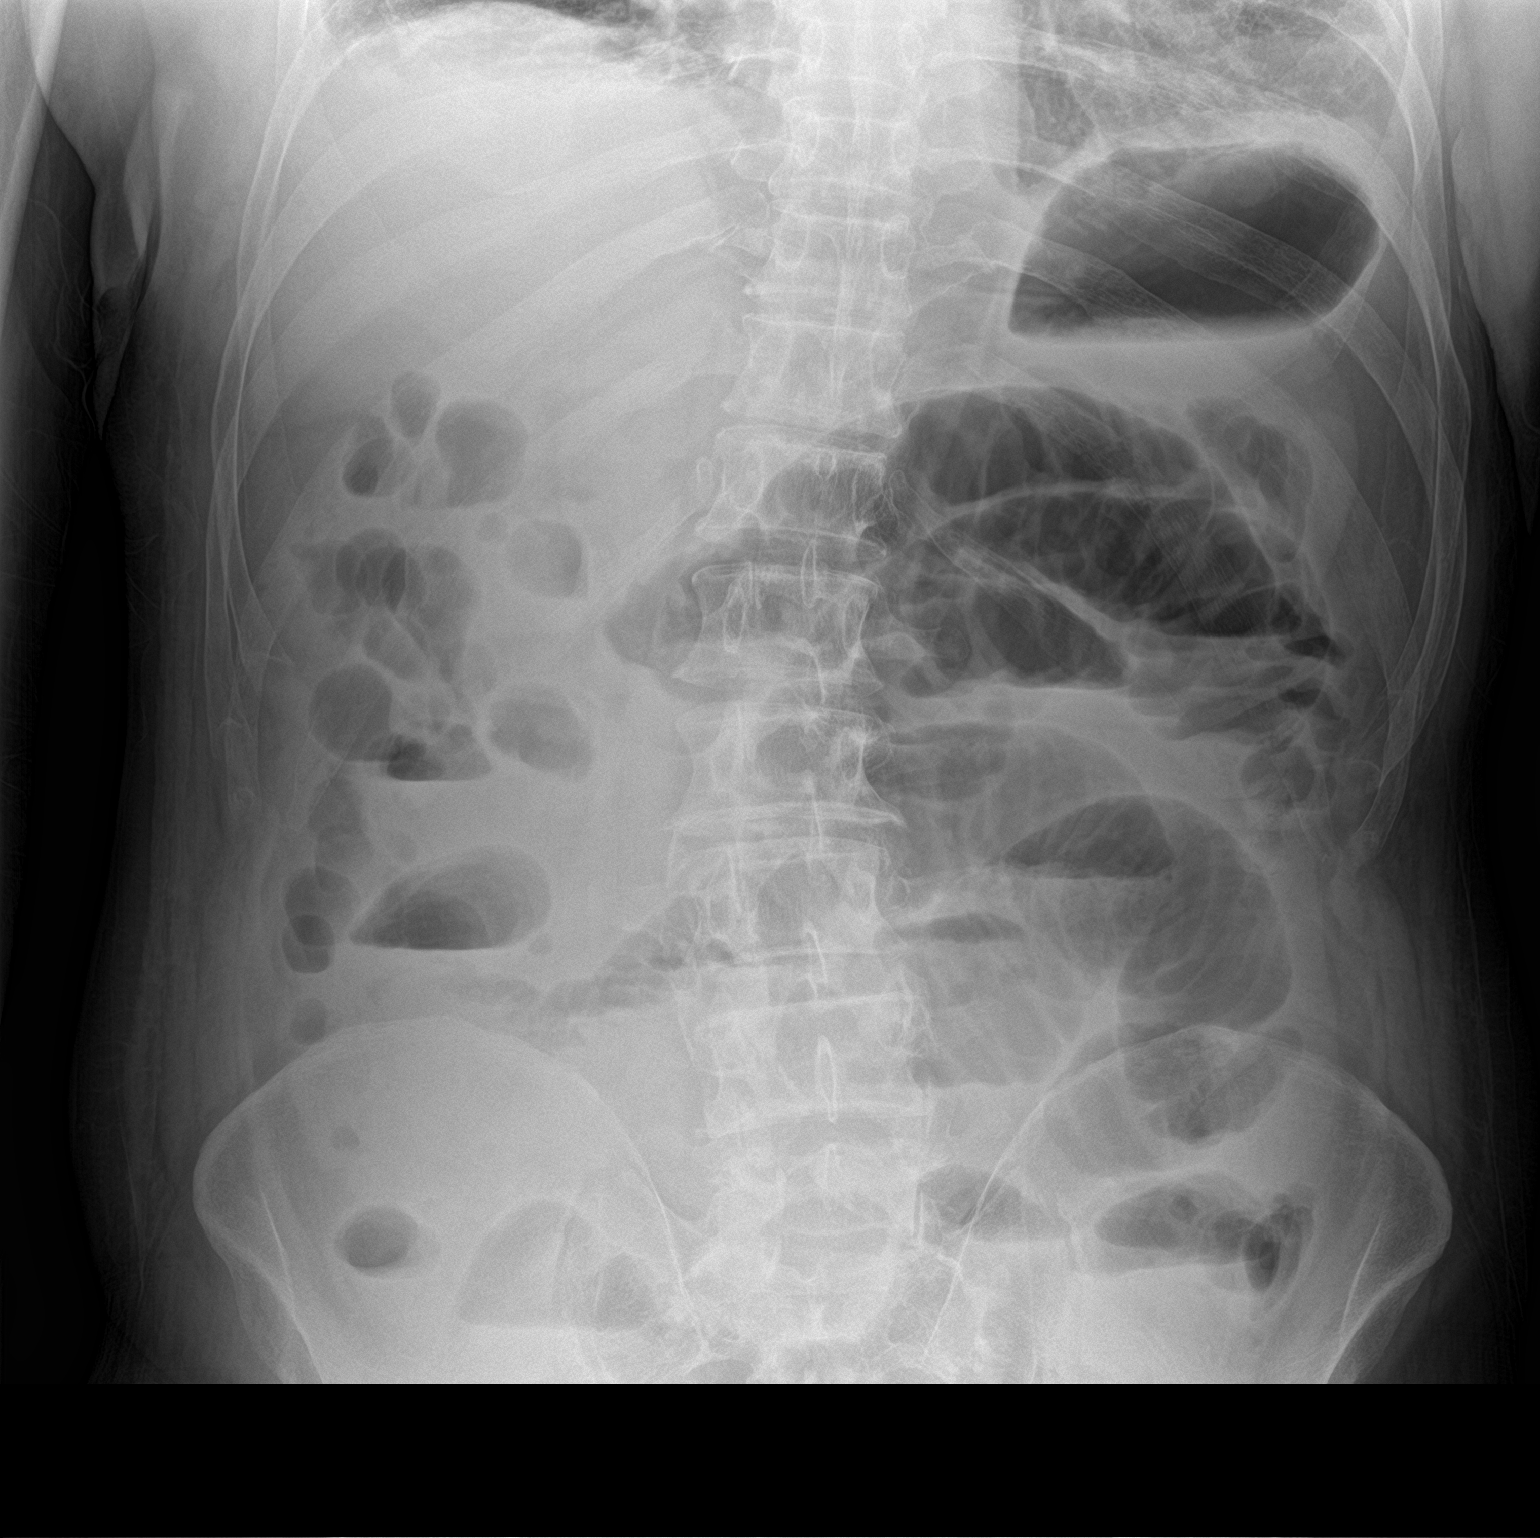

[abdomen supine]
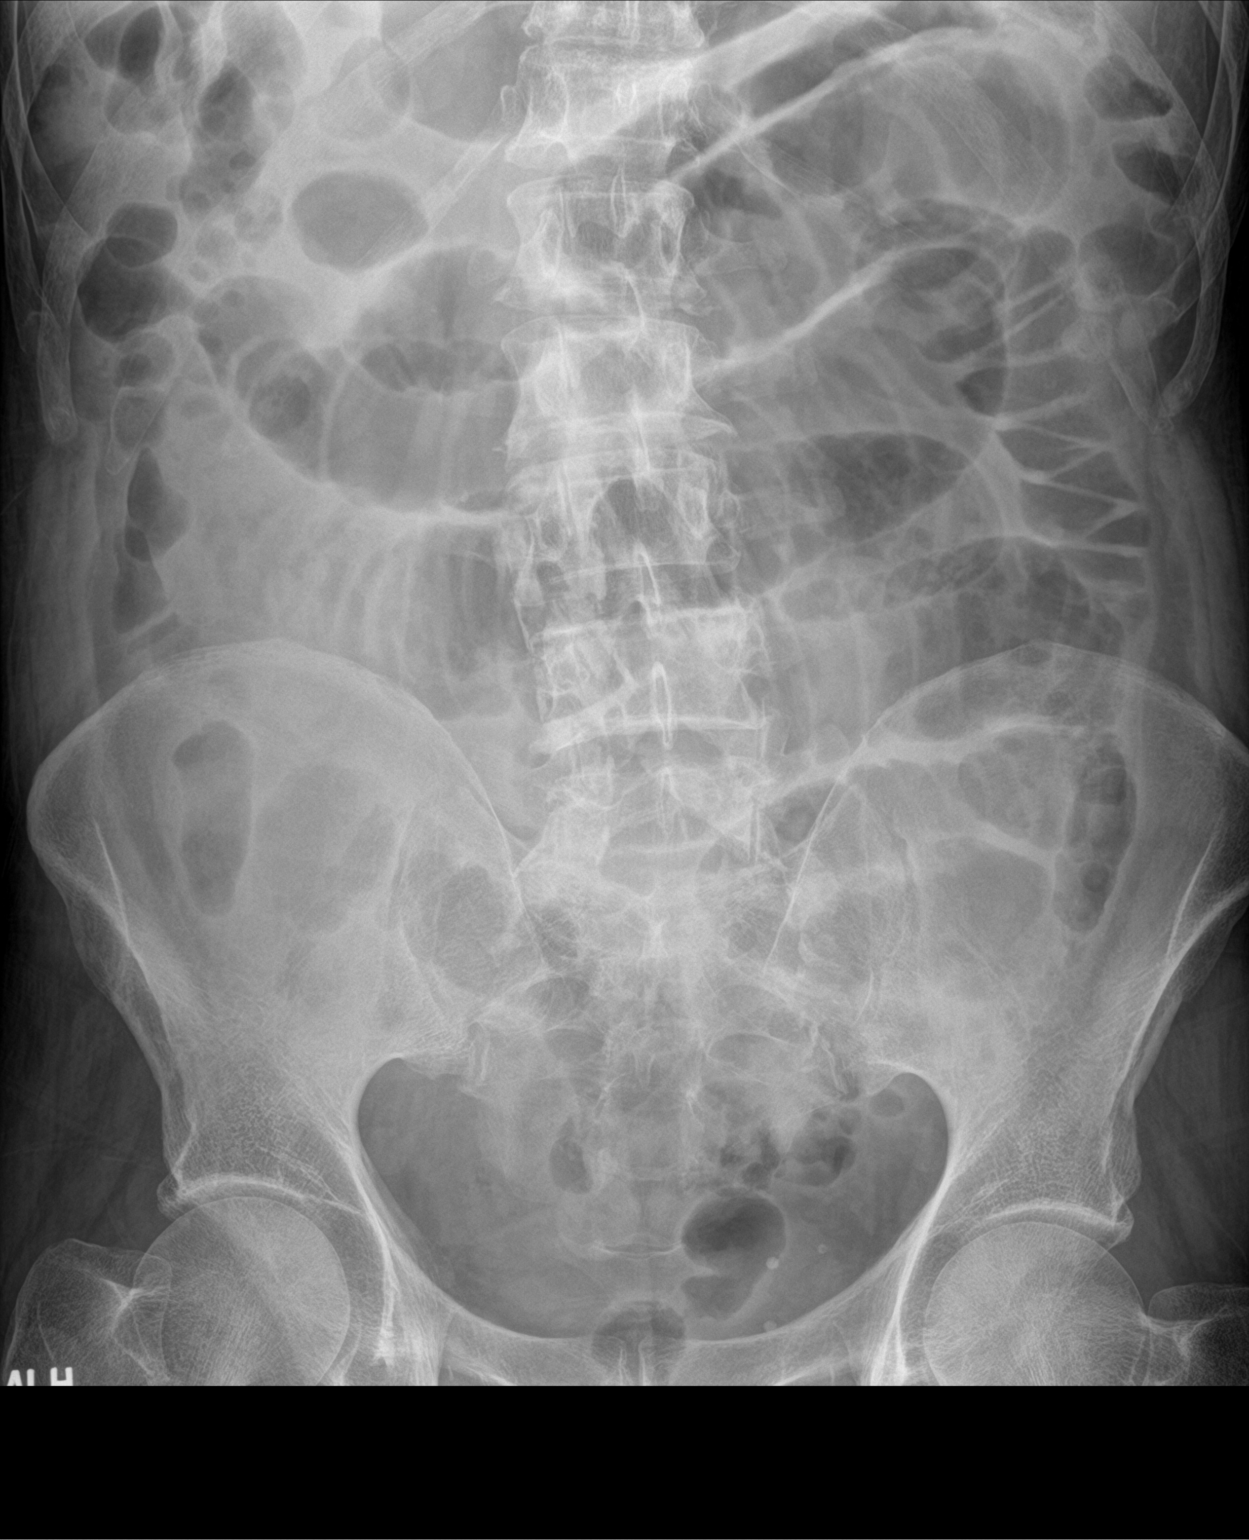

[2 of 2 positions shown; findings below may reference images not displayed]

FINDINGS: Upright film shows no evidence for intraperitoneal free air. Gaseous
small bowel dilatation is evident with some small bowel loops in the
central abdomen measuring up to 5.5 cm diameter. Small volume of gas
seen scattered along the length of the colon.
IMPRESSION: Prominent gaseous dilation of central small bowel loops measuring up
to 5.5 cm. Imaging features suggest small bowel obstruction.

## 2017-12-25 ENCOUNTER — Encounter: Payer: Self-pay | Admitting: Gastroenterology

## 2018-01-17 DIAGNOSIS — J449 Chronic obstructive pulmonary disease, unspecified: Secondary | ICD-10-CM | POA: Diagnosis not present

## 2018-01-22 ENCOUNTER — Encounter (INDEPENDENT_AMBULATORY_CARE_PROVIDER_SITE_OTHER): Payer: Self-pay

## 2018-01-22 ENCOUNTER — Encounter: Payer: Self-pay | Admitting: Gastroenterology

## 2018-01-22 ENCOUNTER — Ambulatory Visit: Payer: Medicare Other | Admitting: Gastroenterology

## 2018-01-22 ENCOUNTER — Other Ambulatory Visit (INDEPENDENT_AMBULATORY_CARE_PROVIDER_SITE_OTHER): Payer: Medicare Other

## 2018-01-22 VITALS — BP 112/64 | HR 77 | Ht 70.0 in | Wt 164.0 lb

## 2018-01-22 DIAGNOSIS — R195 Other fecal abnormalities: Secondary | ICD-10-CM

## 2018-01-22 DIAGNOSIS — J449 Chronic obstructive pulmonary disease, unspecified: Secondary | ICD-10-CM | POA: Diagnosis not present

## 2018-01-22 LAB — CBC WITH DIFFERENTIAL/PLATELET
Basophils Absolute: 0 10*3/uL (ref 0.0–0.1)
Basophils Relative: 0.5 % (ref 0.0–3.0)
Eosinophils Absolute: 0.2 10*3/uL (ref 0.0–0.7)
Eosinophils Relative: 2.7 % (ref 0.0–5.0)
HCT: 42.7 % (ref 39.0–52.0)
Hemoglobin: 14.5 g/dL (ref 13.0–17.0)
Lymphocytes Relative: 24 % (ref 12.0–46.0)
Lymphs Abs: 1.5 10*3/uL (ref 0.7–4.0)
MCHC: 34.1 g/dL (ref 30.0–36.0)
MCV: 93.3 fl (ref 78.0–100.0)
Monocytes Absolute: 0.7 10*3/uL (ref 0.1–1.0)
Monocytes Relative: 11.3 % (ref 3.0–12.0)
Neutro Abs: 4 10*3/uL (ref 1.4–7.7)
Neutrophils Relative %: 61.5 % (ref 43.0–77.0)
Platelets: 265 10*3/uL (ref 150.0–400.0)
RBC: 4.57 Mil/uL (ref 4.22–5.81)
RDW: 13.9 % (ref 11.5–15.5)
WBC: 6.4 10*3/uL (ref 4.0–10.5)

## 2018-01-22 LAB — IBC PANEL
Iron: 106 ug/dL (ref 42–165)
Saturation Ratios: 29.3 % (ref 20.0–50.0)
Transferrin: 258 mg/dL (ref 212.0–360.0)

## 2018-01-22 LAB — FERRITIN: Ferritin: 25.9 ng/mL (ref 22.0–322.0)

## 2018-01-22 NOTE — Progress Notes (Addendum)
[facility] Consult Note:  History: Lonnie Schaefer 01/22/2018  Referring physician: [name], [place]  Reason for consult/chief complaint: heme positive (tested positive in pcp's office; denies seeing any visible blood; has no other GI complaints at this time)   Subjective  HPI:  This is a very pleasant 77 year old man referred to [place] for heme positive stool. Stool test positive for occult blood at routine appointment on 12/10/2017.  Stool card again +12/16/2017 as outpatient.  Hemoglobin 13.8 with normal MCV on 12/10/2017  He denies abdominal pain, altered bowel habits, black or bloody stool, upper digestive symptoms such as heartburn dysphagia or vomiting.  His appetite is good and weight stable. He has COPD requiring 2 L of supplemental oxygen at night.  [name] believes he had an EGD and colonoscopy with Dr. Leonie Douglas of the [facility] within the last 5 years.  ROS:  Review of Systems  Constitutional: Negative for appetite change and unexpected weight change.  HENT: Negative for mouth sores and voice change.   Eyes: Negative for pain and redness.  Respiratory: Positive for shortness of breath. Negative for cough.   Cardiovascular: Negative for chest pain and palpitations.  Genitourinary: Negative for dysuria and hematuria.  Musculoskeletal: Positive for arthralgias. Negative for myalgias.  Skin: Negative for pallor and rash.  Neurological: Negative for weakness and headaches.  Hematological: Negative for adenopathy.     Past Medical History: Past Medical History:  Diagnosis Date  . Bowel obstruction ([place]) 06/2009-10/01/2013 X 5; 04/03/2016  . COPD (chronic obstructive pulmonary disease) ([place])   . H/O hiatal hernia   . History of stomach ulcers ~ 1960  . Hypoxia    idiopathic sleep related hypoxia  . Impaired fasting glucose   . On home oxygen therapy    "2L q hs" (04/03/2016)  . Oral cancer ([place])   . Osteoporosis   . Pneumonia 1990s  X 1  . SBO (small bowel obstruction) ([place]) 04/21/2017  . Shortness of breath    Note multiple prior bowel obstructions above  Past Surgical History: Past Surgical History:  Procedure Laterality Date  . ABDOMINAL EXPLORATION SURGERY  06/2009; 10/2009  . ABDOMINAL SURGERY    . APPENDECTOMY  10/2009  . CHOLECYSTECTOMY N/A 09/06/2016   Procedure: LAPAROSCOPIC CHOLECYSTECTOMY WITH  INTRAOPERATIVE CHOLANGIOGRAM;  Surgeon: Ralene Ok, MD;  Location: [place];  Service: General;  Laterality: N/A;  . COLONOSCOPY    . ESOPHAGOGASTRODUODENOSCOPY     Dr Oletta Lamas   . GUM SURGERY    . GUM SURGERY     upper gum surgery, cancer   . HERNIA REPAIR     "[facility]"  . 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 UMBILICAL HERNIA REPAIR  06/2009   w/mesh     Family History: History reviewed. No pertinent family history. No family history of colorectal cancer Social History: Social History   Socioeconomic History  . Marital status: Married    Spouse name: Not on file  . Number of children: Not on file  . Years of education: Not on file  . Highest education level: Not on file  Occupational History  . Not on file  Social Needs  . Financial resource strain: Not on file  . Food insecurity:    Worry: Not on file    Inability: Not on file  . Transportation needs:    Medical: Not on file    Non-medical: Not on file  Tobacco Use  .  Smoking status: Former Smoker    Packs/day: 2.00    Years: 50.00    Pack years: 100.00    Types: Cigarettes    Last attempt to quit: 10/15/2001    Years since quitting: 16.2  . Smokeless tobacco: Never Used  Substance and Sexual Activity  . Alcohol use: Yes    Comment: 04/03/2016 "quit drinking 01/1987" occ use   . Drug use: No  . Sexual activity: Not Currently    Partners: Female  Lifestyle  . Physical activity:    Days per week: Not on file    Minutes per session: Not on file  . Stress: Not on file  Relationships  . Social connections:     Talks on phone: Not on file    Gets together: Not on file    Attends religious service: Not on file    Active member of club or organization: Not on file    Attends meetings of clubs or organizations: Not on file    Relationship status: Not on file  Other Topics Concern  . Not on file  Social History Narrative  . Not on file    Allergies: No Known Allergies  Outpatient Meds: Current Outpatient Medications  Medication Sig Dispense Refill  . albuterol (PROVENTIL HFA;VENTOLIN HFA) 108 (90 BASE) MCG/ACT inhaler Inhale 1 puff into the lungs every 6 (six) hours as needed for wheezing.     Marland Kitchen alendronate (FOSAMAX) 70 MG tablet Take 70 mg by mouth every Wednesday.  0  . aspirin 81 MG chewable tablet Chew 81 mg by mouth daily.    . clobetasol ointment (TEMOVATE) 4.97 % Apply 1 application topically 2 (two) times daily as needed (rash).    . mupirocin cream (BACTROBAN) 2 % Apply topically 2 (two) times daily. Apply to rash on the back (Patient taking differently: Apply 1 application topically 2 (two) times daily as needed. Apply to rash on the back) 15 g 0  . ondansetron (ZOFRAN) 4 MG tablet Take 1 tablet (4 mg total) by mouth every 6 (six) hours. 12 tablet 0  . OXYGEN-HELIUM IN Inhale 2 each into the lungs See admin instructions. Inhale 2 liters at bedtime    . tiotropium (SPIRIVA) 18 MCG inhalation capsule Place 18 mcg into inhaler and inhale daily.     No current facility-administered medications for this visit.       ___________________________________________________________________ Objective   Exam:  BP 112/64   Pulse 77   Ht 5\' 10"  (1.778 m)   Wt 164 lb (74.4 kg)   BMI 23.53 kg/m    General: this is a(n) well-appearing man.  He ambulates without cough or dyspnea or need for supplemental oxygen.  Gets on exam table without assistance, comfortable breathing at rest.  Eyes: sclera anicteric, no redness  ENT: oral mucosa moist without lesions, no cervical or  supraclavicular lymphadenopathy  CV: RRR without murmur, S1/S2, no JVD, no peripheral edema  Resp: Decreased breath sounds bilaterally, normal RR and effort noted  GI: soft, no tenderness, with active bowel sounds. No guarding or palpable organomegaly noted.  Skin; warm and dry, no rash or jaundice noted  Neuro: awake, alert and oriented x 3. Normal gross motor function and fluent speech  Labs: Labs as noted above from recent primary care visit   Assessment: Encounter Diagnoses  Name Primary?  . Heme positive stool Yes  . Chronic obstructive pulmonary disease, unspecified COPD type ([place])     Heme positive stool on a patient who appears  to have already been up-to-date with colorectal cancer screening, has no localizing GI symptoms and a normal hemoglobin.  This is possibly related to aspirin use, and it is not clear yet if he needs endoscopic work-up for this.  He is certainly at increased risk for sedation related complications, and also increased risk of perforation due to multiple prior bowel obstructions.  Plan:  CBC and iron studies today.  If remain normal, that is reassuring. We will request records of previous endoscopic tests from Dr. Loney Loh office.  After that, we will contact the patient further recommendations.  Thank you for the courtesy of this consult.  Please call me with any questions or concerns.  Nelida Meuse III  CC: Referring provider noted above   Addendum for record review 02/18/2018:  Records from Dr. Leonie Douglas at [facility].  Colonoscopy dated 08/18/2013, complete to cecum, good preparation.  Left-sided diverticulosis and internal hemorrhoids noted, otherwise reportedly normal exam.  As noted above, this patient seen positive stool is suspected to be false positive with no anemia or localizing GI symptoms. Given that he is up-to-date on colorectal cancer screening as well, no plans for endoscopic procedures.  We will recommend that he follows up  with primary care to check his hemoglobin periodically.  If he should become iron deficient and anemic, he should be referred back to [place] for further evaluation.  This updated note will be faxed to his primary care provider.  Wilfrid Lund, MD

## 2018-01-22 NOTE — Patient Instructions (Signed)
If you are age 75 or older, your body mass index should be between 23-30. Your Body mass index is 23.53 kg/m. If this is out of the aforementioned range listed, please consider follow up with your Primary Care Provider.  If you are age 20 or younger, your body mass index should be between 19-25. Your Body mass index is 23.53 kg/m. If this is out of the aformentioned range listed, please consider follow up with your Primary Care Provider.   Your provider has requested that you go to the basement level for lab work before leaving today. Press "B" on the elevator. The lab is located at the first door on the left as you exit the elevator.  It was a pleasure to see you today!  Dr. Loletha Carrow

## 2018-02-11 ENCOUNTER — Telehealth: Payer: Self-pay | Admitting: Gastroenterology

## 2018-02-11 NOTE — Telephone Encounter (Signed)
Rec'd from [facility] Latino forwarded 10 pages to Dr. Wilfrid Lund III

## 2018-02-17 DIAGNOSIS — J449 Chronic obstructive pulmonary disease, unspecified: Secondary | ICD-10-CM | POA: Diagnosis not present

## 2018-02-18 ENCOUNTER — Telehealth: Payer: Self-pay | Admitting: Gastroenterology

## 2018-02-18 NOTE — Telephone Encounter (Signed)
I received the report of Dr. Oletta Lamas' colonoscopy from July 2015.  I still feel Lonnie Schaefer does not need a colonoscopy at this time.  I recommend he follow-up with primary care to have his blood count checked periodically.  If he becomes anemic, they should refer him back to [place].  I made an addendum to my December office note to reflect these records and my recommendation.  Please fax that note to them.  - HD

## 2018-02-18 NOTE — Telephone Encounter (Signed)
Spoke to [place] (wife) . She states clear understanding and agrees with the plan. She will inform her husband of the current recommendation.

## 2018-03-20 DIAGNOSIS — J449 Chronic obstructive pulmonary disease, unspecified: Secondary | ICD-10-CM | POA: Diagnosis not present

## 2018-04-18 DIAGNOSIS — J449 Chronic obstructive pulmonary disease, unspecified: Secondary | ICD-10-CM | POA: Diagnosis not present

## 2018-05-19 DIAGNOSIS — J449 Chronic obstructive pulmonary disease, unspecified: Secondary | ICD-10-CM | POA: Diagnosis not present

## 2018-06-10 DIAGNOSIS — M810 Age-related osteoporosis without current pathological fracture: Secondary | ICD-10-CM | POA: Diagnosis not present

## 2018-06-10 DIAGNOSIS — J449 Chronic obstructive pulmonary disease, unspecified: Secondary | ICD-10-CM | POA: Diagnosis not present

## 2018-06-10 DIAGNOSIS — Z86007 Personal history of in-situ neoplasm of skin: Secondary | ICD-10-CM | POA: Diagnosis not present

## 2018-06-10 DIAGNOSIS — R195 Other fecal abnormalities: Secondary | ICD-10-CM | POA: Diagnosis not present

## 2018-06-18 DIAGNOSIS — J449 Chronic obstructive pulmonary disease, unspecified: Secondary | ICD-10-CM | POA: Diagnosis not present

## 2018-07-10 DIAGNOSIS — L309 Dermatitis, unspecified: Secondary | ICD-10-CM | POA: Diagnosis not present

## 2018-07-19 DIAGNOSIS — J449 Chronic obstructive pulmonary disease, unspecified: Secondary | ICD-10-CM | POA: Diagnosis not present

## 2018-07-23 DIAGNOSIS — Z85819 Personal history of malignant neoplasm of unspecified site of lip, oral cavity, and pharynx: Secondary | ICD-10-CM | POA: Diagnosis not present

## 2018-07-23 DIAGNOSIS — K121 Other forms of stomatitis: Secondary | ICD-10-CM | POA: Diagnosis not present

## 2018-07-30 DIAGNOSIS — Z85819 Personal history of malignant neoplasm of unspecified site of lip, oral cavity, and pharynx: Secondary | ICD-10-CM | POA: Diagnosis not present

## 2018-07-30 DIAGNOSIS — K1379 Other lesions of oral mucosa: Secondary | ICD-10-CM | POA: Diagnosis not present

## 2018-07-30 DIAGNOSIS — K121 Other forms of stomatitis: Secondary | ICD-10-CM | POA: Diagnosis not present

## 2018-08-18 DIAGNOSIS — J449 Chronic obstructive pulmonary disease, unspecified: Secondary | ICD-10-CM | POA: Diagnosis not present

## 2018-09-18 DIAGNOSIS — J449 Chronic obstructive pulmonary disease, unspecified: Secondary | ICD-10-CM | POA: Diagnosis not present

## 2018-10-19 DIAGNOSIS — J449 Chronic obstructive pulmonary disease, unspecified: Secondary | ICD-10-CM | POA: Diagnosis not present

## 2018-11-16 DIAGNOSIS — K121 Other forms of stomatitis: Secondary | ICD-10-CM | POA: Diagnosis not present

## 2018-11-18 DIAGNOSIS — J449 Chronic obstructive pulmonary disease, unspecified: Secondary | ICD-10-CM | POA: Diagnosis not present

## 2018-12-16 DIAGNOSIS — M810 Age-related osteoporosis without current pathological fracture: Secondary | ICD-10-CM | POA: Diagnosis not present

## 2018-12-16 DIAGNOSIS — Z125 Encounter for screening for malignant neoplasm of prostate: Secondary | ICD-10-CM | POA: Diagnosis not present

## 2018-12-16 DIAGNOSIS — R195 Other fecal abnormalities: Secondary | ICD-10-CM | POA: Diagnosis not present

## 2018-12-16 DIAGNOSIS — J449 Chronic obstructive pulmonary disease, unspecified: Secondary | ICD-10-CM | POA: Diagnosis not present

## 2018-12-16 DIAGNOSIS — Z13220 Encounter for screening for lipoid disorders: Secondary | ICD-10-CM | POA: Diagnosis not present

## 2018-12-16 DIAGNOSIS — Z131 Encounter for screening for diabetes mellitus: Secondary | ICD-10-CM | POA: Diagnosis not present

## 2018-12-16 DIAGNOSIS — Z0000 Encounter for general adult medical examination without abnormal findings: Secondary | ICD-10-CM | POA: Diagnosis not present

## 2018-12-19 DIAGNOSIS — J449 Chronic obstructive pulmonary disease, unspecified: Secondary | ICD-10-CM | POA: Diagnosis not present

## 2019-01-04 ENCOUNTER — Other Ambulatory Visit: Payer: Self-pay | Admitting: Nurse Practitioner

## 2019-01-04 DIAGNOSIS — M810 Age-related osteoporosis without current pathological fracture: Secondary | ICD-10-CM

## 2019-01-18 DIAGNOSIS — J449 Chronic obstructive pulmonary disease, unspecified: Secondary | ICD-10-CM | POA: Diagnosis not present

## 2019-04-19 ENCOUNTER — Other Ambulatory Visit: Payer: Medicare Other

## 2019-05-24 ENCOUNTER — Other Ambulatory Visit: Payer: Medicare Other

## 2019-06-18 ENCOUNTER — Ambulatory Visit
Admission: RE | Admit: 2019-06-18 | Discharge: 2019-06-18 | Disposition: A | Discharge status: Home / Self Care | Payer: Medicare Other | Source: Ambulatory Visit | Attending: Nurse Practitioner | Admitting: Nurse Practitioner

## 2019-06-18 ENCOUNTER — Other Ambulatory Visit: Payer: Self-pay

## 2019-06-18 DIAGNOSIS — M810 Age-related osteoporosis without current pathological fracture: Secondary | ICD-10-CM

## 2019-08-04 ENCOUNTER — Other Ambulatory Visit: Payer: Self-pay

## 2019-08-04 ENCOUNTER — Inpatient Hospital Stay (HOSPITAL_COMMUNITY)
Admission: EM | Admit: 2019-08-04 | Discharge: 2019-08-09 | DRG: 389 | Disposition: A | Discharge status: Home / Self Care | Payer: Medicare Other | Attending: Family Medicine | Admitting: Family Medicine

## 2019-08-04 ENCOUNTER — Encounter (HOSPITAL_COMMUNITY): Payer: Self-pay | Admitting: Emergency Medicine

## 2019-08-04 DIAGNOSIS — K429 Umbilical hernia without obstruction or gangrene: Secondary | ICD-10-CM | POA: Diagnosis present

## 2019-08-04 DIAGNOSIS — Z9981 Dependence on supplemental oxygen: Secondary | ICD-10-CM

## 2019-08-04 DIAGNOSIS — D72829 Elevated white blood cell count, unspecified: Secondary | ICD-10-CM | POA: Diagnosis present

## 2019-08-04 DIAGNOSIS — K56600 Partial intestinal obstruction, unspecified as to cause: Principal | ICD-10-CM | POA: Diagnosis present

## 2019-08-04 DIAGNOSIS — R9389 Abnormal findings on diagnostic imaging of other specified body structures: Secondary | ICD-10-CM

## 2019-08-04 DIAGNOSIS — K56609 Unspecified intestinal obstruction, unspecified as to partial versus complete obstruction: Secondary | ICD-10-CM | POA: Diagnosis not present

## 2019-08-04 DIAGNOSIS — N179 Acute kidney failure, unspecified: Secondary | ICD-10-CM | POA: Diagnosis present

## 2019-08-04 DIAGNOSIS — J449 Chronic obstructive pulmonary disease, unspecified: Secondary | ICD-10-CM | POA: Diagnosis present

## 2019-08-04 DIAGNOSIS — R6510 Systemic inflammatory response syndrome (SIRS) of non-infectious origin without acute organ dysfunction: Secondary | ICD-10-CM | POA: Diagnosis present

## 2019-08-04 DIAGNOSIS — E872 Acidosis, unspecified: Secondary | ICD-10-CM | POA: Diagnosis present

## 2019-08-04 DIAGNOSIS — Z20822 Contact with and (suspected) exposure to covid-19: Secondary | ICD-10-CM | POA: Diagnosis present

## 2019-08-04 DIAGNOSIS — Z85819 Personal history of malignant neoplasm of unspecified site of lip, oral cavity, and pharynx: Secondary | ICD-10-CM

## 2019-08-04 DIAGNOSIS — Z87891 Personal history of nicotine dependence: Secondary | ICD-10-CM

## 2019-08-04 DIAGNOSIS — Z0189 Encounter for other specified special examinations: Secondary | ICD-10-CM

## 2019-08-04 DIAGNOSIS — J9611 Chronic respiratory failure with hypoxia: Secondary | ICD-10-CM | POA: Diagnosis present

## 2019-08-04 DIAGNOSIS — K5090 Crohn's disease, unspecified, without complications: Secondary | ICD-10-CM | POA: Diagnosis present

## 2019-08-04 DIAGNOSIS — Z9049 Acquired absence of other specified parts of digestive tract: Secondary | ICD-10-CM

## 2019-08-04 DIAGNOSIS — Z7983 Long term (current) use of bisphosphonates: Secondary | ICD-10-CM

## 2019-08-04 DIAGNOSIS — Z79899 Other long term (current) drug therapy: Secondary | ICD-10-CM

## 2019-08-04 DIAGNOSIS — Z7951 Long term (current) use of inhaled steroids: Secondary | ICD-10-CM

## 2019-08-04 DIAGNOSIS — K4090 Unilateral inguinal hernia, without obstruction or gangrene, not specified as recurrent: Secondary | ICD-10-CM | POA: Diagnosis present

## 2019-08-04 DIAGNOSIS — Z8711 Personal history of peptic ulcer disease: Secondary | ICD-10-CM

## 2019-08-04 DIAGNOSIS — E860 Dehydration: Secondary | ICD-10-CM | POA: Diagnosis present

## 2019-08-04 DIAGNOSIS — M810 Age-related osteoporosis without current pathological fracture: Secondary | ICD-10-CM | POA: Diagnosis present

## 2019-08-04 DIAGNOSIS — Z7982 Long term (current) use of aspirin: Secondary | ICD-10-CM

## 2019-08-04 LAB — COMPREHENSIVE METABOLIC PANEL
ALT: 17 U/L (ref 0–44)
AST: 22 U/L (ref 15–41)
Albumin: 3.9 g/dL (ref 3.5–5.0)
Alkaline Phosphatase: 54 U/L (ref 38–126)
Anion gap: 14 (ref 5–15)
BUN: 21 mg/dL (ref 8–23)
CO2: 27 mmol/L (ref 22–32)
Calcium: 10.2 mg/dL (ref 8.9–10.3)
Chloride: 101 mmol/L (ref 98–111)
Creatinine, Ser: 1.37 mg/dL — ABNORMAL HIGH (ref 0.61–1.24)
GFR calc Af Amer: 58 mL/min — ABNORMAL LOW (ref >60)
GFR calc non Af Amer: 50 mL/min — ABNORMAL LOW (ref >60)
Glucose, Bld: 193 mg/dL — ABNORMAL HIGH (ref 70–99)
Potassium: 4 mmol/L (ref 3.5–5.1)
Sodium: 142 mmol/L (ref 135–145)
Total Bilirubin: 2.7 mg/dL — ABNORMAL HIGH (ref 0.3–1.2)
Total Protein: 7.6 g/dL (ref 6.5–8.1)

## 2019-08-04 LAB — URINALYSIS, ROUTINE W REFLEX MICROSCOPIC
Bacteria, UA: NONE SEEN
Bilirubin Urine: NEGATIVE
Glucose, UA: NEGATIVE mg/dL
Hgb urine dipstick: NEGATIVE
Ketones, ur: 20 mg/dL — AB
Leukocytes,Ua: NEGATIVE
Nitrite: NEGATIVE
Protein, ur: 100 mg/dL — AB
Specific Gravity, Urine: 1.033 — ABNORMAL HIGH (ref 1.005–1.030)
pH: 5 (ref 5.0–8.0)

## 2019-08-04 LAB — CBC
HCT: 48 % (ref 39.0–52.0)
Hemoglobin: 15.6 g/dL (ref 13.0–17.0)
MCH: 31.1 pg (ref 26.0–34.0)
MCHC: 32.5 g/dL (ref 30.0–36.0)
MCV: 95.6 fL (ref 80.0–100.0)
Platelets: 312 10*3/uL (ref 150–400)
RBC: 5.02 MIL/uL (ref 4.22–5.81)
RDW: 12.9 % (ref 11.5–15.5)
WBC: 18.4 10*3/uL — ABNORMAL HIGH (ref 4.0–10.5)
nRBC: 0 % (ref 0.0–0.2)

## 2019-08-04 LAB — LIPASE, BLOOD: Lipase: 23 U/L (ref 11–51)

## 2019-08-04 MED ORDER — SODIUM CHLORIDE 0.9% FLUSH: 3.0000 mL | Freq: Once | INTRAVENOUS | Status: DC

## 2019-08-04 NOTE — ED Triage Notes (Signed)
Pt c/o abdominal pain, nausea/vomiting and diarrhea that started last night. Pt concerned he has an obstruction, hx of the same.

## 2019-08-05 ENCOUNTER — Emergency Department (HOSPITAL_COMMUNITY): Payer: Medicare Other

## 2019-08-05 ENCOUNTER — Inpatient Hospital Stay (HOSPITAL_COMMUNITY): Payer: Medicare Other

## 2019-08-05 DIAGNOSIS — J9611 Chronic respiratory failure with hypoxia: Secondary | ICD-10-CM | POA: Diagnosis present

## 2019-08-05 DIAGNOSIS — Z7983 Long term (current) use of bisphosphonates: Secondary | ICD-10-CM | POA: Diagnosis not present

## 2019-08-05 DIAGNOSIS — Z79899 Other long term (current) drug therapy: Secondary | ICD-10-CM | POA: Diagnosis not present

## 2019-08-05 DIAGNOSIS — R6510 Systemic inflammatory response syndrome (SIRS) of non-infectious origin without acute organ dysfunction: Secondary | ICD-10-CM | POA: Diagnosis present

## 2019-08-05 DIAGNOSIS — Z87891 Personal history of nicotine dependence: Secondary | ICD-10-CM | POA: Diagnosis not present

## 2019-08-05 DIAGNOSIS — K56609 Unspecified intestinal obstruction, unspecified as to partial versus complete obstruction: Secondary | ICD-10-CM

## 2019-08-05 DIAGNOSIS — K56600 Partial intestinal obstruction, unspecified as to cause: Secondary | ICD-10-CM | POA: Diagnosis present

## 2019-08-05 DIAGNOSIS — Z8711 Personal history of peptic ulcer disease: Secondary | ICD-10-CM | POA: Diagnosis not present

## 2019-08-05 DIAGNOSIS — Z9981 Dependence on supplemental oxygen: Secondary | ICD-10-CM | POA: Diagnosis not present

## 2019-08-05 DIAGNOSIS — Z9049 Acquired absence of other specified parts of digestive tract: Secondary | ICD-10-CM | POA: Diagnosis not present

## 2019-08-05 DIAGNOSIS — Z7951 Long term (current) use of inhaled steroids: Secondary | ICD-10-CM | POA: Diagnosis not present

## 2019-08-05 DIAGNOSIS — Z20822 Contact with and (suspected) exposure to covid-19: Secondary | ICD-10-CM | POA: Diagnosis present

## 2019-08-05 DIAGNOSIS — E860 Dehydration: Secondary | ICD-10-CM | POA: Diagnosis present

## 2019-08-05 DIAGNOSIS — Z7982 Long term (current) use of aspirin: Secondary | ICD-10-CM | POA: Diagnosis not present

## 2019-08-05 DIAGNOSIS — E872 Acidosis, unspecified: Secondary | ICD-10-CM | POA: Diagnosis present

## 2019-08-05 DIAGNOSIS — K4090 Unilateral inguinal hernia, without obstruction or gangrene, not specified as recurrent: Secondary | ICD-10-CM | POA: Diagnosis present

## 2019-08-05 DIAGNOSIS — M810 Age-related osteoporosis without current pathological fracture: Secondary | ICD-10-CM | POA: Diagnosis present

## 2019-08-05 DIAGNOSIS — K429 Umbilical hernia without obstruction or gangrene: Secondary | ICD-10-CM | POA: Diagnosis present

## 2019-08-05 DIAGNOSIS — N179 Acute kidney failure, unspecified: Secondary | ICD-10-CM | POA: Diagnosis present

## 2019-08-05 DIAGNOSIS — Z85819 Personal history of malignant neoplasm of unspecified site of lip, oral cavity, and pharynx: Secondary | ICD-10-CM | POA: Diagnosis not present

## 2019-08-05 DIAGNOSIS — J449 Chronic obstructive pulmonary disease, unspecified: Secondary | ICD-10-CM | POA: Diagnosis present

## 2019-08-05 DIAGNOSIS — K5090 Crohn's disease, unspecified, without complications: Secondary | ICD-10-CM | POA: Diagnosis present

## 2019-08-05 LAB — CBC WITH DIFFERENTIAL/PLATELET
Abs Immature Granulocytes: 0.07 10*3/uL (ref 0.00–0.07)
Basophils Absolute: 0 10*3/uL (ref 0.0–0.1)
Basophils Relative: 0 %
Eosinophils Absolute: 0 10*3/uL (ref 0.0–0.5)
Eosinophils Relative: 0 %
HCT: 45.3 % (ref 39.0–52.0)
Hemoglobin: 14.7 g/dL (ref 13.0–17.0)
Immature Granulocytes: 1 %
Lymphocytes Relative: 9 %
Lymphs Abs: 1.1 10*3/uL (ref 0.7–4.0)
MCH: 31.2 pg (ref 26.0–34.0)
MCHC: 32.5 g/dL (ref 30.0–36.0)
MCV: 96.2 fL (ref 80.0–100.0)
Monocytes Absolute: 1.1 10*3/uL — ABNORMAL HIGH (ref 0.1–1.0)
Monocytes Relative: 8 %
Neutro Abs: 10.4 10*3/uL — ABNORMAL HIGH (ref 1.7–7.7)
Neutrophils Relative %: 82 %
Platelets: 259 10*3/uL (ref 150–400)
RBC: 4.71 MIL/uL (ref 4.22–5.81)
RDW: 13.3 % (ref 11.5–15.5)
WBC: 12.6 10*3/uL — ABNORMAL HIGH (ref 4.0–10.5)
nRBC: 0 % (ref 0.0–0.2)

## 2019-08-05 LAB — PHOSPHORUS: Phosphorus: 3.6 mg/dL (ref 2.5–4.6)

## 2019-08-05 LAB — MAGNESIUM: Magnesium: 2.2 mg/dL (ref 1.7–2.4)

## 2019-08-05 LAB — SARS CORONAVIRUS 2 BY RT PCR (HOSPITAL ORDER, PERFORMED IN ~~LOC~~ HOSPITAL LAB): SARS Coronavirus 2: NEGATIVE

## 2019-08-05 LAB — CREATININE, SERUM
Creatinine, Ser: 1.06 mg/dL (ref 0.61–1.24)
GFR calc Af Amer: >60 mL/min (ref >60)
GFR calc non Af Amer: >60 mL/min (ref >60)

## 2019-08-05 LAB — LACTIC ACID, PLASMA
Lactic Acid, Venous: 3.1 mmol/L (ref 0.5–1.9)
Lactic Acid, Venous: 2.3 mmol/L (ref 0.5–1.9)

## 2019-08-05 MED ORDER — ONDANSETRON HCL 4 MG/2ML IJ SOLN
4.0000 mg | Freq: Four times a day (QID) | INTRAMUSCULAR | Status: DC | PRN
  Administered 2019-08-05: 4 mg via INTRAVENOUS
  Filled 2019-08-05: qty 2

## 2019-08-05 MED ORDER — IPRATROPIUM-ALBUTEROL 0.5-2.5 (3) MG/3ML IN SOLN: 3.0000 mL | Freq: Four times a day (QID) | RESPIRATORY_TRACT | Status: DC | PRN

## 2019-08-05 MED ORDER — ACETAMINOPHEN 650 MG RE SUPP: 650.0000 mg | Freq: Four times a day (QID) | RECTAL | Status: DC | PRN

## 2019-08-05 MED ORDER — OXYCODONE HCL 5 MG PO TABS
5.0000 mg | ORAL_TABLET | ORAL | Status: DC | PRN
  Administered 2019-08-07: 5 mg via ORAL
  Filled 2019-08-05: qty 1

## 2019-08-05 MED ORDER — FENTANYL CITRATE (PF) 100 MCG/2ML IJ SOLN
50.0000 ug | Freq: Once | INTRAMUSCULAR | Status: AC
  Administered 2019-08-05: 50 ug via INTRAVENOUS
  Filled 2019-08-05: qty 2

## 2019-08-05 MED ORDER — DIATRIZOATE MEGLUMINE & SODIUM 66-10 % PO SOLN
90.0000 mL | Freq: Once | ORAL | Status: AC
  Administered 2019-08-05: 90 mL via NASOGASTRIC
  Filled 2019-08-05: qty 90

## 2019-08-05 MED ORDER — ACETAMINOPHEN 325 MG PO TABS: 650.0000 mg | ORAL_TABLET | Freq: Four times a day (QID) | ORAL | Status: DC | PRN

## 2019-08-05 MED ORDER — MORPHINE SULFATE (PF) 2 MG/ML IV SOLN
2.0000 mg | INTRAVENOUS | Status: DC | PRN
  Administered 2019-08-05 – 2019-08-06 (×6): 2 mg via INTRAVENOUS
  Filled 2019-08-05 (×6): qty 1

## 2019-08-05 MED ORDER — UMECLIDINIUM BROMIDE 62.5 MCG/INH IN AEPB
1.0000 | INHALATION_SPRAY | Freq: Every day | RESPIRATORY_TRACT | Status: DC
  Administered 2019-08-05 – 2019-08-09 (×5): 1 via RESPIRATORY_TRACT
  Filled 2019-08-05: qty 7

## 2019-08-05 MED ORDER — SODIUM CHLORIDE 0.9 % IV SOLN: INTRAVENOUS | Status: DC

## 2019-08-05 MED ORDER — ONDANSETRON HCL 4 MG/2ML IJ SOLN
4.0000 mg | Freq: Four times a day (QID) | INTRAMUSCULAR | Status: DC | PRN
  Administered 2019-08-05 – 2019-08-07 (×3): 4 mg via INTRAVENOUS
  Filled 2019-08-05 (×3): qty 2

## 2019-08-05 MED ORDER — HYDROMORPHONE HCL 1 MG/ML IJ SOLN: 0.5000 mg | INTRAMUSCULAR | Status: DC | PRN

## 2019-08-05 MED ORDER — SODIUM CHLORIDE 0.9 % IV BOLUS
500.0000 mL | Freq: Once | INTRAVENOUS | Status: AC
  Administered 2019-08-05: 500 mL via INTRAVENOUS

## 2019-08-05 MED ORDER — ENOXAPARIN SODIUM 40 MG/0.4ML ~~LOC~~ SOLN
40.0000 mg | SUBCUTANEOUS | Status: DC
  Administered 2019-08-06 – 2019-08-07 (×2): 40 mg via SUBCUTANEOUS
  Filled 2019-08-05 (×2): qty 0.4

## 2019-08-05 MED ORDER — PIPERACILLIN-TAZOBACTAM 3.375 G IVPB 30 MIN
3.3750 g | Freq: Once | INTRAVENOUS | Status: AC
  Administered 2019-08-05: 3.375 g via INTRAVENOUS
  Filled 2019-08-05: qty 50

## 2019-08-05 MED ORDER — ALBUTEROL SULFATE (2.5 MG/3ML) 0.083% IN NEBU: 2.5000 mg | INHALATION_SOLUTION | Freq: Four times a day (QID) | RESPIRATORY_TRACT | Status: DC | PRN

## 2019-08-05 MED ORDER — PIPERACILLIN-TAZOBACTAM 3.375 G IVPB
3.3750 g | Freq: Three times a day (TID) | INTRAVENOUS | Status: DC
  Administered 2019-08-05 – 2019-08-07 (×6): 3.375 g via INTRAVENOUS
  Filled 2019-08-05 (×6): qty 50

## 2019-08-05 MED ORDER — ONDANSETRON HCL 4 MG PO TABS: 4.0000 mg | ORAL_TABLET | Freq: Four times a day (QID) | ORAL | Status: DC | PRN

## 2019-08-05 MED ORDER — LIDOCAINE VISCOUS HCL 2 % MT SOLN
15.0000 mL | Freq: Once | OROMUCOSAL | Status: AC
  Administered 2019-08-05: 5 mL via OROMUCOSAL

## 2019-08-05 MED ORDER — SODIUM CHLORIDE 0.9 % IV SOLN
Freq: Once | INTRAVENOUS | Status: AC
Start: 2019-08-05 — End: 2019-08-05

## 2019-08-05 MED ORDER — IOHEXOL 300 MG/ML  SOLN
100.0000 mL | Freq: Once | INTRAMUSCULAR | Status: AC | PRN
  Administered 2019-08-05: 100 mL via INTRAVENOUS

## 2019-08-05 MED ORDER — ACETAMINOPHEN 500 MG PO TABS: 1000.0000 mg | ORAL_TABLET | Freq: Four times a day (QID) | ORAL | Status: DC | PRN

## 2019-08-05 NOTE — ED Notes (Signed)
Accidental click on, "Saline Lock IV, Maintain IV access" order.

## 2019-08-05 NOTE — ED Notes (Signed)
Pt brought back from scan. NG tube placed and needs to be attached to suction.

## 2019-08-05 NOTE — ED Notes (Signed)
Pt states that he usually wears 2L O2 at night at home, 2L  provided at this time after providing morphine and pt stating he will be napping

## 2019-08-05 NOTE — ED Provider Notes (Signed)
[facility] EMER[facility]ENCY DEPARTMENT Provider Note   CSN: [id number] Arrival date & time: 08/04/19  1704     History Chief Complaint  Patient presents with  . Abdominal Pain    Lonnie Schaefer is a 79 y.o. male.  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 TNTC nonbloody diarrhea. Last emesis was 6:00 am 08/04/19 but he has not attempted any PO since then. He denies fever. No urinary symptoms, chest pain, SOB.   The history is provided by the patient. No language interpreter was used.  Abdominal Pain Associated symptoms: diarrhea, nausea and vomiting   Associated symptoms: no chills and no fever        Past Medical History:  Diagnosis Date  . Bowel obstruction ([place]) 06/2009-10/01/2013 X 5; 04/03/2016  . COPD (chronic obstructive pulmonary disease) ([place])   . H/O hiatal hernia   . History of stomach ulcers ~ 1960  . Hypoxia    idiopathic sleep related hypoxia  . Impaired fasting glucose   . On home oxygen therapy    "2L q hs" (04/03/2016)  . Oral cancer ([place])   . Osteoporosis   . Pneumonia 1990s X 1  . SBO (small bowel obstruction) ([place]) 04/21/2017  . Shortness of breath     Patient Active Problem List   Diagnosis Date Noted  . Acute abdominal pain   . Partial small bowel obstruction ([place]) 04/21/2017  . Acute cholecystitis 09/05/2016  . Leukocytosis 04/03/2016  . SBO (small bowel obstruction) ([place]) 04/03/2016  . Hyperbilirubinemia 04/03/2016  . AKI (acute kidney injury) ([place]) 04/03/2016  . Nausea, vomiting and diarrhea 10/01/2013  . Small bowel obstruction ([place]) 12/04/2012  . COPD (chronic obstructive pulmonary disease) ([place]) 02/23/2007    Past Surgical History:  Procedure Laterality Date  . ABDOMINAL EXPLORATION SURGERY  06/2009; 10/2009  . ABDOMINAL SURGERY    . APPENDECTOMY   10/2009  . CHOLECYSTECTOMY N/A 09/06/2016   Procedure: LAPAROSCOPIC CHOLECYSTECTOMY WITH  INTRAOPERATIVE CHOLANGIOGRAM;  Surgeon: Ralene Ok, MD;  Location: [facility];  Service: General;  Laterality: N/A;  . COLONOSCOPY    . ESOPHAGOGASTRODUODENOSCOPY     Dr Oletta Lamas   . GUM SURGERY    . GUM SURGERY     upper gum surgery, cancer   . HERNIA REPAIR     "[facility]"  . 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 UMBILICAL HERNIA REPAIR  06/2009   w/mesh       No family history on file.  Social History   Tobacco Use  . Smoking status: Former Smoker    Packs/day: 2.00    Years: 50.00    Pack years: 100.00    Types: Cigarettes    Quit date: 10/15/2001    Years since quitting: 17.8  . Smokeless tobacco: Never Used  Vaping Use  . Vaping Use: Never used  Substance Use Topics  . Alcohol use: Yes    Comment: 04/03/2016 "quit drinking 01/1987" occ use   . Drug use: No    Home Medications Prior to Admission medications   Medication Sig Start Date End Date Taking? Authorizing Provider  albuterol (PROVENTIL HFA;VENTOLIN HFA) 108 (90 BASE) MCG/ACT inhaler Inhale 1 puff into the lungs every 6 (six) hours as needed for wheezing.     [provider]  alendronate (FOSAMAX) 70 MG tablet  Take 70 mg by mouth every Wednesday. 03/25/16   [provider]  aspirin 81 MG chewable tablet Chew 81 mg by mouth daily.    [provider]  clobetasol ointment (TEMOVATE) 0.10 % Apply 1 application topically 2 (two) times daily as needed (rash).    [provider]  mupirocin cream (BACTROBAN) 2 % Apply topically 2 (two) times daily. Apply to rash on the back Patient taking differently: Apply 1 application topically 2 (two) times daily as needed. Apply to rash on the back 04/11/16   Bonnielee Haff, MD  ondansetron (ZOFRAN) 4 MG tablet Take 1 tablet (4 mg total) by mouth every 6 (six) hours. 06/06/16   Nat Christen, MD  OXYGEN-HELIUM IN Inhale 2 each into the  lungs See admin instructions. Inhale 2 liters at bedtime    [provider]  tiotropium (SPIRIVA) 18 MCG inhalation capsule Place 18 mcg into inhaler and inhale daily.    [provider]    Allergies    Patient has no known allergies.  Review of Systems   Review of Systems  Constitutional: Positive for appetite change. Negative for chills and fever.  HENT: Negative.   Respiratory: Negative.   Cardiovascular: Negative.   Gastrointestinal: Positive for abdominal distention, abdominal pain, diarrhea, nausea and vomiting. Negative for blood in stool.  Genitourinary: Negative.   Musculoskeletal: Negative.   Skin: Negative.   Neurological: Negative.  Negative for weakness.    Physical Exam Updated Vital Signs BP (!) 147/81 (BP Location: Right Arm)   Pulse 96   Temp 98.2 F (36.8 C) (Oral)   Resp 16   SpO2 95%   Physical Exam Vitals and nursing note reviewed.  Constitutional:      Appearance: He is well-developed.  HENT:     Head: Normocephalic.  Cardiovascular:     Rate and Rhythm: Normal rate and regular rhythm.  Pulmonary:     Effort: Pulmonary effort is normal.     Breath sounds: Normal breath sounds.  Abdominal:     General: Bowel sounds are normal. There is distension.     Palpations: Abdomen is soft.     Tenderness: There is abdominal tenderness in the periumbilical area and left lower quadrant. There is no guarding or rebound.     Comments: Hypertympanic  Genitourinary:    Testes: Normal.        Right: Swelling not present.        Left: Swelling not present.  Musculoskeletal:        General: Normal range of motion.     Cervical back: Normal range of motion and neck supple.  Skin:    General: Skin is warm and dry.     Findings: No rash.  Neurological:     Mental Status: He is alert and oriented to person, place, and time.     ED Results / Procedures / Treatments   Labs (all labs ordered are listed, but only abnormal results are  displayed) Labs Reviewed  COMPREHENSIVE METABOLIC PANEL - Abnormal; Notable for the following components:      Result Value   Glucose, Bld 193 (*)    Creatinine, Ser 1.37 (*)    Total Bilirubin 2.7 (*)    GFR calc non Af Amer 50 (*)    GFR calc Af Amer 58 (*)    All other components within normal limits  CBC - Abnormal; Notable for the following components:   WBC 18.4 (*)    All other components within  normal limits  URINALYSIS, ROUTINE W REFLEX MICROSCOPIC - Abnormal; Notable for the following components:   Color, Urine AMBER (*)    APPearance HAZY (*)    Specific Gravity, Urine 1.033 (*)    Ketones, ur 20 (*)    Protein, ur 100 (*)    All other components within normal limits  LIPASE, BLOOD  LACTIC ACID, PLASMA  LACTIC ACID, PLASMA   Results for orders placed or performed during the hospital encounter of 08/04/19  Lipase, blood  Result Value Ref Range   Lipase 23 11 - 51 U/L  Comprehensive metabolic panel  Result Value Ref Range   Sodium 142 135 - 145 mmol/L   Potassium 4.0 3.5 - 5.1 mmol/L   Chloride 101 98 - 111 mmol/L   CO2 27 22 - 32 mmol/L   Glucose, Bld 193 (H) 70 - 99 mg/dL   BUN 21 8 - 23 mg/dL   Creatinine, Ser 1.37 (H) 0.61 - 1.24 mg/dL   Calcium 10.2 8.9 - 10.3 mg/dL   Total Protein 7.6 6.5 - 8.1 g/dL   Albumin 3.9 3.5 - 5.0 g/dL   AST 22 15 - 41 U/L   ALT 17 0 - 44 U/L   Alkaline Phosphatase 54 38 - 126 U/L   Total Bilirubin 2.7 (H) 0.3 - 1.2 mg/dL   GFR calc non Af Amer 50 (L) >60 mL/min   GFR calc Af Amer 58 (L) >60 mL/min   Anion gap 14 5 - 15  CBC  Result Value Ref Range   WBC 18.4 (H) 4.0 - 10.5 K/uL   RBC 5.02 4.22 - 5.81 MIL/uL   Hemoglobin 15.6 13.0 - 17.0 g/dL   HCT 48.0 39 - 52 %   MCV 95.6 80.0 - 100.0 fL   MCH 31.1 26.0 - 34.0 pg   MCHC 32.5 30.0 - 36.0 g/dL   RDW 12.9 11.5 - 15.5 %   Platelets 312 150 - 400 K/uL   nRBC 0.0 0.0 - 0.2 %  Urinalysis, Routine w reflex microscopic  Result Value Ref Range   Color, Urine AMBER (A)  YELLOW   APPearance HAZY (A) CLEAR   Specific Gravity, Urine 1.033 (H) 1.005 - 1.030   pH 5.0 5.0 - 8.0   Glucose, UA NEGATIVE NEGATIVE mg/dL   Hgb urine dipstick NEGATIVE NEGATIVE   Bilirubin Urine NEGATIVE NEGATIVE   Ketones, ur 20 (A) NEGATIVE mg/dL   Protein, ur 100 (A) NEGATIVE mg/dL   Nitrite NEGATIVE NEGATIVE   Leukocytes,Ua NEGATIVE NEGATIVE   RBC / HPF 0-5 0 - 5 RBC/hpf   WBC, UA 0-5 0 - 5 WBC/hpf   Bacteria, UA NONE SEEN NONE SEEN   Squamous Epithelial / LPF 0-5 0 - 5   Mucus PRESENT    Hyaline Casts, UA PRESENT   Lactic acid, plasma  Result Value Ref Range   Lactic Acid, Venous 3.1 (HH) 0.5 - 1.9 mmol/L    EKG None  Radiology No results found.  Procedures Procedures (including critical care time)  Medications Ordered in ED Medications  sodium chloride flush (NS) 0.9 % injection 3 mL (has no administration in time range)  fentaNYL (SUBLIMAZE) injection 50 mcg (has no administration in time range)    ED Course  I have reviewed the triage vital signs and the nursing notes.  Pertinent labs & imaging results that were available during my care of the patient were reviewed by me and considered in my medical decision making (see chart for details).  MDM Rules/Calculators/A&P                          Patient to ED with abdominal pain, N, V, D for the past 25 hours. H/O SBO he reports feels similar.   Patient well appearing. Abdominal distended, non pertonitic, No BS. LLQ tenderness. No nausea or vomiting in the ED. He is kept NPO. Suspect recurrent SBO. Pain medication given - patient is comfortable - no complaints.   CT scan c/w SBO transition point mid jejunum. NG tube ordered. Chart reviewed. Last SBO 2015, nonsurgical resolution. These findings were discussed with Dr. Gershon Crane, gen. Surg, who will see the patient in the ED for admission.   NG tube placement unsuccessful x 2. Chart reviewed. He has required NG tube placement under fluoroscopy multiple times in  the past due to difficulty passing the tube. Order placed. Dr. Gershon Crane made aware of the delay.    Final Clinical Impression(s) / ED Diagnoses Final diagnoses:  None   1. SBO, recurrent  Rx / DC Orders ED Discharge Orders    None       Charlann Lange, PA-C 08/05/19 [id number]    Orpah Greek, MD 08/05/19 [phone]

## 2019-08-05 NOTE — ED Notes (Signed)
Pt transported to IR 

## 2019-08-05 NOTE — Consult Note (Signed)
Lonnie Schaefer [birth year]  [id number].    Requesting MD: Charlann Lange, PA-C (Attending: Dr. Betsey Holiday) Chief Complaint/Reason for Consult: SBO  HPI: Lonnie Schaefer is a 79 y.o. male with a history of COPD on 2L o2 at night who presented with abdominal pain, n/v/d.   Patient reports that on Sunday he began having constant, crampy lower abdominal pain.  He reports on Tuesday, 6/29, around midnight he began having diarrhea.  Shortly after he began experiencing nausea, emesis and his pain worsened.  He presented to the ED for evaluation.  Since receiving IV pain medication his pain has subsided and is now "soreness".  He reports his last episode of emesis was around 6 AM on 6/30.  No p.o. intake since that time.  Does still have occasional nausea.  Continues to pass flatus.  No BM since episode of diarrhea on 6/29. He reports that this feels similar to when he has had an SBO in the past.  Per review patient had 1 SBO managed with ex lap.  Remaining SBO's and 2015, 2018, and 2019 were treated with medical management and resolved.  He has a history of prior exploratory laparotomy, umbilical hernia repair, laparoscopic appendectomy, and laparoscopic cholecystectomy.  He is not on blood thinners  In the ED morning he was noted to have a mild fever of 100.6.  Heart rate 86-100.  Mild tachypnea.  No hypotension or hypoxia.  Currently on baseline 2 L.  WBC 18.4.  Creatinine 1.37.  Lactic acid 3.1 -> 2.3. 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  We were asked to see.  ROS: Review of Systems  Constitutional: Positive for fever. Negative for chills.  Respiratory: Positive for shortness of breath (baseline).   Cardiovascular: Negative for chest pain.  Gastrointestinal: Positive for abdominal pain, diarrhea, nausea and vomiting. Negative for constipation.  Genitourinary: Negative for dysuria.  Musculoskeletal: Negative for back pain.   Skin: Negative for rash.  Psychiatric/Behavioral: Negative for substance abuse.  All other systems reviewed and are negative.   No family history on file.  Past Medical History:  Diagnosis Date  . Bowel obstruction ([place]) 06/2009-10/01/2013 X 5; 04/03/2016  . COPD (chronic obstructive pulmonary disease) ([place])   . H/O hiatal hernia   . History of stomach ulcers ~ 1960  . Hypoxia    idiopathic sleep related hypoxia  . Impaired fasting glucose   . On home oxygen therapy    "2L q hs" (04/03/2016)  . Oral cancer ([place])   . Osteoporosis   . Pneumonia 1990s X 1  . SBO (small bowel obstruction) ([place]) 04/21/2017  . Shortness of breath     Past Surgical History:  Procedure Laterality Date  . ABDOMINAL EXPLORATION SURGERY  06/2009; 10/2009  . ABDOMINAL SURGERY    . APPENDECTOMY  10/2009  . CHOLECYSTECTOMY N/A 09/06/2016   Procedure: LAPAROSCOPIC CHOLECYSTECTOMY WITH  INTRAOPERATIVE CHOLANGIOGRAM;  Surgeon: Ralene Ok, MD;  Location: [facility];  Service: General;  Laterality: N/A;  . COLONOSCOPY    . ESOPHAGOGASTRODUODENOSCOPY     Dr Oletta Lamas   . GUM SURGERY    . GUM SURGERY     upper gum surgery, cancer   . HERNIA REPAIR     "[facility]"  . 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 UMBILICAL HERNIA REPAIR  06/2009   w/mesh    Social History:  reports that he quit  smoking about 17 years ago. His smoking use included cigarettes. He has a 100.00 pack-year smoking history. He has never used smokeless tobacco. He reports current alcohol use. He reports that he does not use drugs. Does not currently smoke.  Quit in 2003. No alcohol use No illicit drug use Retired Lives at home with his wife  Allergies: No Known Allergies  (Not in a hospital admission)    Physical Exam: Blood pressure (!) 145/77, pulse 86, temperature (!) 100.6 F (38.1 C), temperature source Rectal, resp. rate (!) 21, SpO2 94 %. General: pleasant, WD/WN white male who is laying in bed in NAD  HEENT: head is normocephalic, atraumatic.  Sclera are noninjected.  PERRL.  Ears and nose without any masses or lesions.  Mouth is pink and moist. Dentition fair Heart: regular, rate, and rhythm.  Normal s1,s2. No obvious murmurs, gallops, or rubs noted.  Palpable pedal pulses bilaterally  Lungs: Expiratory wheezing throughout. No rhonchi, or rales noted.  Respiratory effort nonlabored. On 2L Abd: Soft, mild distension, some tenderness of the RLQ without rebound, rigidity or guarding. No peritonitis. +BS, no masses, hernias, or organomegaly. Prior laparoscopic and laparotomy incisions are well healed.  MS: no BUE/BLE edema, calves soft and nontender Skin: warm and dry with no masses, lesions, or rashes Psych: A&Ox4 with an appropriate affect Neuro: cranial nerves grossly intact, equal strength in BUE/BLE bilaterally, normal speech, though process intact   Results for orders placed or performed during the hospital encounter of 08/04/19 (from the past 48 hour(s))  Lipase, blood     Status: None   Collection Time: 08/04/19  5:30 PM  Result Value Ref Range   Lipase 23 11 - 51 U/L    Comment: Performed at [facility], [place] [address], [place], [place] [address]  Comprehensive metabolic panel     Status: Abnormal   Collection Time: 08/04/19  5:30 PM  Result Value Ref Range   Sodium 142 135 - 145 mmol/L   Potassium 4.0 3.5 - 5.1 mmol/L   Chloride 101 98 - 111 mmol/L   CO2 27 22 - 32 mmol/L   Glucose, Bld 193 (H) 70 - 99 mg/dL    Comment: Glucose reference range applies only to samples taken after fasting for at least 8 hours.   BUN 21 8 - 23 mg/dL   Creatinine, Ser 1.37 (H) 0.61 - 1.24 mg/dL   Calcium 10.2 8.9 - 10.3 mg/dL   Total Protein 7.6 6.5 - 8.1 g/dL   Albumin 3.9 3.5 - 5.0 g/dL   AST 22 15 - 41 U/L   ALT 17 0 - 44 U/L   Alkaline Phosphatase 54 38 - 126 U/L   Total Bilirubin 2.7 (H) 0.3 - 1.2 mg/dL   GFR calc non Af Amer 50 (L) >60 mL/min   GFR calc Af Amer 58 (L) >60 mL/min    Anion gap 14 5 - 15    Comment: Performed at [facility] [address], [place], [place] [address]  CBC     Status: Abnormal   Collection Time: 08/04/19  5:30 PM  Result Value Ref Range   WBC 18.4 (H) 4.0 - 10.5 K/uL   RBC 5.02 4.22 - 5.81 MIL/uL   Hemoglobin 15.6 13.0 - 17.0 g/dL   HCT 48.0 39 - 52 %   MCV 95.6 80.0 - 100.0 fL   MCH 31.1 26.0 - 34.0 pg   MCHC 32.5 30.0 - 36.0 g/dL   RDW 12.9  11.5 - 15.5 %   Platelets 312 150 - 400 K/uL   nRBC 0.0 0.0 - 0.2 %    Comment: Performed at [facility], [place] [address], [place], [place] [address]  Urinalysis, Routine w reflex microscopic     Status: Abnormal   Collection Time: 08/04/19  7:54 PM  Result Value Ref Range   Color, Urine AMBER (A) YELLOW    Comment: BIOCHEMICALS MAY BE AFFECTED BY COLOR   APPearance HAZY (A) CLEAR   Specific Gravity, Urine 1.033 (H) 1.005 - 1.030   pH 5.0 5.0 - 8.0   Glucose, UA NEGATIVE NEGATIVE mg/dL   Hgb urine dipstick NEGATIVE NEGATIVE   Bilirubin Urine NEGATIVE NEGATIVE   Ketones, ur 20 (A) NEGATIVE mg/dL   Protein, ur 100 (A) NEGATIVE mg/dL   Nitrite NEGATIVE NEGATIVE   Leukocytes,Ua NEGATIVE NEGATIVE   RBC / HPF 0-5 0 - 5 RBC/hpf   WBC, UA 0-5 0 - 5 WBC/hpf   Bacteria, UA NONE SEEN NONE SEEN   Squamous Epithelial / LPF 0-5 0 - 5   Mucus PRESENT    Hyaline Casts, UA PRESENT     Comment: Performed at [facility], [place] [address] [place], [place] [address]  Lactic acid, plasma     Status: Abnormal   Collection Time: 08/05/19  1:49 AM  Result Value Ref Range   Lactic Acid, Venous 3.1 (HH) 0.5 - 1.9 mmol/L    Comment: CRITICAL RESULT CALLED TO, READ BACK BY AND VERIFIED WITH: PEICKERT P,RN 08/05/19 0247 WAYK Performed at [facility], [place] [address], [place], [place] [address]   Lactic acid, plasma     Status: Abnormal   Collection Time: 08/05/19  3:40 AM  Result Value Ref Range   Lactic Acid, Venous 2.3 (HH) 0.5 - 1.9 mmol/L    Comment: CRITICAL VALUE NOTED.   VALUE IS CONSISTENT WITH PREVIOUSLY REPORTED AND CALLED VALUE. Performed at [facility], [place] [address] [place], [place] [address]   SARS Coronavirus 2 by RT PCR (hospital order, performed in [facility] hospital lab) Nasopharyngeal Nasopharyngeal Swab     Status: None   Collection Time: 08/05/19  3:57 AM   Specimen: Nasopharyngeal Swab  Result Value Ref Range   SARS Coronavirus 2 NEGATIVE NEGATIVE    Comment: (NOTE) SARS-CoV-2 target nucleic acids are NOT DETECTED.  The SARS-CoV-2 RNA is generally detectable in upper and lower respiratory specimens during the acute phase of infection. The lowest concentration of SARS-CoV-2 viral copies this assay can detect is 250 copies / mL. A negative result does not preclude SARS-CoV-2 infection and should not be used as the sole basis for treatment or other patient management decisions.  A negative result may occur with improper specimen collection / handling, submission of specimen other than nasopharyngeal swab, presence of viral mutation(s) within the areas targeted by this assay, and inadequate number of viral copies (<250 copies / mL). A negative result must be combined with clinical observations, patient history, and epidemiological information.  Fact Sheet for Patients:   StrictlyIdeas.no  Fact Sheet for Healthcare Providers: BankingDealers.co.za  This test is not yet approved or  cleared by the [place] FDA and has been authorized for detection and/or diagnosis of SARS-CoV-2 by FDA under an Emergency Use Authorization (EUA).  This EUA will remain in effect (meaning this test can be used) for the duration of the COVID-19 declaration under Section 564(b)(1) of the Act, 21 U.S.C. section 360bbb-3(b)(1), unless  the authorization is terminated or revoked sooner.  Performed at [facility], [place] [address], [place], [place] [address]    CT ABDOMEN PELVIS W CONTRAST   Result Date: 08/05/2019 CLINICAL DATA:  Abdominal pain, nausea and vomiting, diarrhea EXAM: CT ABDOMEN AND PELVIS WITH CONTRAST TECHNIQUE: Multidetector CT imaging of the abdomen and pelvis was performed using the standard protocol following bolus administration of intravenous contrast. CONTRAST:  172mL OMNIPAQUE IOHEXOL 300 MG/ML  SOLN COMPARISON:  04/21/2017 FINDINGS: Lower chest: Background emphysema without acute pleural or parenchymal lung disease. Hepatobiliary: Stable hepatic steatosis. No focal liver abnormality. Gallbladder surgically absent. Pancreas: Unremarkable. No pancreatic ductal dilatation or surrounding inflammatory changes. Spleen: Normal in size without focal abnormality. Adrenals/Urinary Tract: Stable left renal cyst. No urinary tract calculi or obstructive uropathy. Bladder is unremarkable. The adrenals are normal. Stomach/Bowel: There is marked distension of the proximal small bowel measuring up to 5.1 cm in diameter. Focal wall thickening within a segment of jejunum within the left mid abdomen reference image 52 is in the area where there is gradual transition from dilated to nondilated bowel. The distal jejunum and ileum are decompressed. Minimal gas and stool throughout the colon. There is colonic diverticulosis without diverticulitis. Vascular/Lymphatic: Aortic atherosclerosis. No enlarged abdominal or pelvic lymph nodes. Reproductive: Prostate is unremarkable. Other: No free fluid or free gas. Small Richter hernia at the umbilicus containing a portion of the anti mesenteric wall of the mid jejunum. Musculoskeletal: No acute or destructive bony lesions. Reconstructed images demonstrate no additional findings. IMPRESSION: 1. Small bowel obstruction involving the proximal to mid jejunum, with transition point corresponding to an area of segmental wall thickening within the mid jejunum. 2. Stable hepatic steatosis. 3. Diverticulosis without diverticulitis. 4. 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 No bowel wall thickening or incarceration. 5. Aortic Atherosclerosis (ICD10-I70.0) and Emphysema (ICD10-J43.9). Electronically Signed   By: Randa Ngo M.D.   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 AKI - Cr 1.37 (baseline 0.79 on 04/25/2017)  SBO - Hx prior exploratory laparotomy, umbilical hernia repair, laparoscopic appendectomy, and laparoscopic cholecystectomy - No indication for emergency surgery - NPO, NGT, SBO protocol  - Keep K>4 and Mg >2 for bowel function - Mobilize for bowel function - Recommend medical admission and continued fluid resuscitation  FEN - NPO, NGT VTE - SCDs, okay for chemical prophylaxis from a general surgery standpoint  ID - None   Jillyn Ledger, [facility] 08/05/2019, 7:49 AM Please see Amion for pager number during day hours 7:00am-4:30pm

## 2019-08-05 NOTE — H&P (Signed)
History and Physical    Lonnie Schaefer DOB: [birth year] DOA: 08/04/2019  PCP: [name], [place]  Patient coming from: Home  I have personally briefly reviewed patient's old medical records in [place]  Chief Complaint: Abdominal pain, nausea vomiting and diarrhea since 3 days  HPI: Lonnie Schaefer is a 79 y.o. 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  Patient tells me that his symptoms started 3 days ago.  Reports generalized abdominal pain associated with nausea, vomiting, nonbloody diarrhea and abdominal distention.  Reports pain is currently 5 out of 10, nonradiating, no aggravating or relieving factors, achy in nature.  He tells me that he had similar symptoms when he had SBO in the past.  He had last bowel movement yesterday morning.  He has been passing gas however denies fever, chills, decreased appetite, generalized weakness, lethargy.  No history of headache, blurry vision, chest pain, shortness of breath, wheezing, leg swelling, orthopnea, PND, urinary or sleep changes.  He had one SBO which was treated with exploratory laparotomy and 3 SBO's treated with medical management and resolved.  He lives with his wife at home.  No history of smoking, alcohol, licit drug use.  ED Course: Upon arrival to ED: Patient had fever of 100.6, blood pressure labile, CBC shows leukocytosis of 18.4, CMP shows AKI, lipase: WNL, UA positive for ketones.  Lactic acid: 3.1 trended down to 2.3, COVID-19 negative, blood culture pending.  CT abdomen/pelvis concerning for SBO.  EDP consulted general surgery recommended medical management.    Review of  Systems: As per HPI otherwise negative.    Past Medical History:  Diagnosis Date  . Bowel obstruction ([place]) 06/2009-10/01/2013 X 5; 04/03/2016  . COPD (chronic obstructive pulmonary disease) ([place])   . H/O hiatal hernia   . History of stomach ulcers ~ 1960  . Hypoxia    idiopathic sleep related hypoxia  . Impaired fasting glucose   . On home oxygen therapy    "2L q hs" (04/03/2016)  . Oral cancer ([place])   . Osteoporosis   . Pneumonia 1990s X 1  . SBO (small bowel obstruction) ([place]) 04/21/2017  . Shortness of breath     Past Surgical History:  Procedure Laterality Date  . ABDOMINAL EXPLORATION SURGERY  06/2009; 10/2009  . ABDOMINAL SURGERY    . APPENDECTOMY  10/2009  . CHOLECYSTECTOMY N/A 09/06/2016   Procedure: LAPAROSCOPIC CHOLECYSTECTOMY WITH  INTRAOPERATIVE CHOLANGIOGRAM;  Surgeon: Ralene Ok, MD;  Location: [facility];  Service: General;  Laterality: N/A;  . COLONOSCOPY    . ESOPHAGOGASTRODUODENOSCOPY     Dr Oletta Lamas   . GUM SURGERY    . GUM SURGERY     upper gum surgery, cancer   . HERNIA REPAIR     "[facility]"  . 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 UMBILICAL HERNIA REPAIR  06/2009   w/mesh     reports that he quit smoking about 17 years ago. His smoking use included cigarettes. He has a 100.00 pack-year smoking history. He has never used smokeless tobacco. He reports current alcohol use. He reports that he does not use drugs.  No Known Allergies  No family history on file.  Prior to Admission  medications   Medication Sig Start Date End Date Taking? Authorizing Provider  albuterol (PROVENTIL HFA;VENTOLIN HFA) 108 (90 BASE) MCG/ACT inhaler Inhale 1 puff into the lungs every 6 (six) hours as needed for wheezing.    Yes [provider]  alendronate (FOSAMAX) 70 MG tablet Take 70 mg by mouth every Wednesday. 03/25/16  Yes [provider]  aspirin 81 MG chewable tablet Chew 81 mg by mouth daily.   Yes [provider]    clobetasol ointment (TEMOVATE) 3.81 % Apply 1 application topically 2 (two) times daily as needed (rash).   Yes [provider]  Docusate Sodium (STOOL SOFTENER LAXATIVE PO) Take 1 tablet by mouth daily as needed (Constipation).   Yes [provider]  lactose free nutrition (BOOST) LIQD Take 237 mLs by mouth daily.   Yes [provider]  magnesium citrate SOLN Take 1 Bottle by mouth daily as needed for mild constipation or severe constipation.   Yes [provider]  tiotropium (SPIRIVA) 18 MCG inhalation capsule Place 18 mcg into inhaler and inhale daily.   Yes [provider]  mupirocin cream (BACTROBAN) 2 % Apply topically 2 (two) times daily. Apply to rash on the back Patient not taking: Reported on 08/05/2019 04/11/16   Bonnielee Haff, MD  ondansetron (ZOFRAN) 4 MG tablet Take 1 tablet (4 mg total) by mouth every 6 (six) hours. Patient not taking: Reported on 08/05/2019 06/06/16   Nat Christen, MD  OXYGEN-HELIUM IN Inhale 2 each into the lungs See admin instructions. Inhale 2 liters at bedtime    [provider]    Physical Exam: Vitals:   08/05/19 0745 08/05/19 1115 08/05/19 1129 08/05/19 1315  BP: (!) 163/79 (!) 147/67  (!) 156/82  Pulse: 88 90 83 87  Resp:  20  18  Temp:      TempSrc:      SpO2: 95% (!) 89% 95% 94%    Constitutional: NAD, calm, comfortable, on 2 L of oxygen via nasal cannula, communicating well. Eyes: PERRL, lids and conjunctivae normal ENMT: Mucous membranes are moist. Posterior pharynx clear of any exudate or lesions.Normal dentition.  Neck: normal, supple, no masses, no thyromegaly Respiratory: clear to auscultation bilaterally, no wheezing, no crackles. Normal respiratory effort. No accessory muscle use.  Cardiovascular: Regular rate and rhythm, no murmurs / rubs / gallops. No extremity edema. 2+ pedal pulses. No carotid bruits.  Abdomen: Mild abdominal distention noted, lower abdominal tenderness noted, no  guarding, no rigidity, bowel sounds sluggish Musculoskeletal: no clubbing / cyanosis. No joint deformity upper and lower extremities. Good ROM, no contractures. Normal muscle tone.  Skin: no rashes, lesions, ulcers. No induration Neurologic: CN 2-12 grossly intact. Sensation intact, DTR normal. Strength 5/5 in all 4.  Psychiatric: Normal judgment and insight. Alert and oriented x 3. Normal mood.    Labs on Admission: I have personally reviewed following labs and imaging studies  CBC: Recent Labs  Lab 08/04/19 1730  WBC 18.4*  HGB 15.6  HCT 48.0  MCV 95.6  PLT 829   Basic Metabolic Panel: Recent Labs  Lab 08/04/19 1730  NA 142  K 4.0  CL 101  CO2 27  GLUCOSE 193*  BUN 21  CREATININE 1.37*  CALCIUM 10.2   GFR: CrCl cannot be calculated (Unknown ideal weight.). Liver Function Tests: Recent Labs  Lab 08/04/19 1730  AST 22  ALT 17  ALKPHOS 54  BILITOT 2.7*  PROT 7.6  ALBUMIN 3.9   Recent Labs  Lab 08/04/19  1730  LIPASE 23   No results for input(s): AMMONIA in the last 168 hours. Coagulation Profile: No results for input(s): INR, PROTIME in the last 168 hours. Cardiac Enzymes: No results for input(s): CKTOTAL, CKMB, CKMBINDEX, TROPONINI in the last 168 hours. BNP (last 3 results) No results for input(s): PROBNP in the last 8760 hours. HbA1C: No results for input(s): HGBA1C in the last 72 hours. CBG: No results for input(s): GLUCAP in the last 168 hours. Lipid Profile: No results for input(s): CHOL, HDL, LDLCALC, TRIG, CHOLHDL, LDLDIRECT in the last 72 hours. Thyroid Function Tests: No results for input(s): TSH, T4TOTAL, FREET4, T3FREE, THYROIDAB in the last 72 hours. Anemia Panel: No results for input(s): VITAMINB12, FOLATE, FERRITIN, TIBC, IRON, RETICCTPCT in the last 72 hours. Urine analysis:    Component Value Date/Time   COLORURINE AMBER (A) 08/04/2019 1954   APPEARANCEUR HAZY (A) 08/04/2019 1954   LABSPEC 1.033 (H) 08/04/2019 1954   PHURINE 5.0  08/04/2019 [place] 08/04/2019 1954   HGBUR NEGATIVE 08/04/2019 1954   BILIRUBINUR NEGATIVE 08/04/2019 1954   KETONESUR 20 (A) 08/04/2019 1954   PROTEINUR 100 (A) 08/04/2019 1954   UROBILINOGEN 0.2 10/01/2013 1119   NITRITE NEGATIVE 08/04/2019 1954   LEUKOCYTESUR NEGATIVE 08/04/2019 1954    Radiological Exams on Admission: DG Abd 1 View  Result Date: 08/05/2019 CLINICAL DATA:  Provided history: 38 French NG tube placed under fluoro after multiple attempts at that side. 18 seconds fluoroscopy time. EXAM: ABDOMEN - 1 VIEW COMPARISON:  CT abdomen/pelvis 08/05/2019 FINDINGS: Problem oriented examination an enteric tube passes below the level of the left hemidiaphragm and coils in the region of the stomach with tip projecting in the region of the cardiac fundus. An additional small curvilinear opacity projects over the right lung base, which may overlie the patient. IMPRESSION: Problem-oriented examination demonstrates NG tube with tip terminating in the region of the gastric fundus. An additional small curvilinear opacity projects over the right lung base, which may overlie the patient. Correlation is recommended. Electronically Signed   By: Kellie Simmering DO   On: 08/05/2019 08:56   CT ABDOMEN PELVIS W CONTRAST  Result Date: 08/05/2019 CLINICAL DATA:  Abdominal pain, nausea and vomiting, diarrhea EXAM: CT ABDOMEN AND PELVIS WITH CONTRAST TECHNIQUE: Multidetector CT imaging of the abdomen and pelvis was performed using the standard protocol following bolus administration of intravenous contrast. CONTRAST:  137mL OMNIPAQUE IOHEXOL 300 MG/ML  SOLN COMPARISON:  04/21/2017 FINDINGS: Lower chest: Background emphysema without acute pleural or parenchymal lung disease. Hepatobiliary: Stable hepatic steatosis. No focal liver abnormality. Gallbladder surgically absent. Pancreas: Unremarkable. No pancreatic ductal dilatation or surrounding inflammatory changes. Spleen: Normal in size without focal  abnormality. Adrenals/Urinary Tract: Stable left renal cyst. No urinary tract calculi or obstructive uropathy. Bladder is unremarkable. The adrenals are normal. Stomach/Bowel: There is marked distension of the proximal small bowel measuring up to 5.1 cm in diameter. Focal wall thickening within a segment of jejunum within the left mid abdomen reference image 52 is in the area where there is gradual transition from dilated to nondilated bowel. The distal jejunum and ileum are decompressed. Minimal gas and stool throughout the colon. There is colonic diverticulosis without diverticulitis. Vascular/Lymphatic: Aortic atherosclerosis. No enlarged abdominal or pelvic lymph nodes. Reproductive: Prostate is unremarkable. Other: No free fluid or free gas. Small Richter hernia at the umbilicus containing a portion of the anti mesenteric wall of the mid jejunum. Musculoskeletal: No acute or destructive bony lesions. Reconstructed images demonstrate no  additional findings. IMPRESSION: 1. Small bowel obstruction involving the proximal to mid jejunum, with transition point corresponding to an area of segmental wall thickening within the mid jejunum. 2. Stable hepatic steatosis. 3. Diverticulosis without diverticulitis. 4. 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 No bowel wall thickening or incarceration. 5. Aortic Atherosclerosis (ICD10-I70.0) and Emphysema (ICD10-J43.9). Electronically Signed   By: Randa Ngo M.D.   On: 08/05/2019 03:33   DG Abd Portable 1V-Small Bowel Protocol-Position Verification  Result Date: 08/05/2019 CLINICAL DATA:  NG tube placement. EXAM: PORTABLE ABDOMEN - 1 VIEW COMPARISON:  Prior abdomen and CT 08/05/2019. FINDINGS: NG tube noted with tip coiled in stomach. Distended loops of small bowel noted consistent previously identified small-bowel obstruction. Curvilinear density previously noted over the right chest no longer identified. IMPRESSION: NG tube noted with tip coiled in  stomach. Distended loops of small bowel noted with previously identified small bowel obstruction. 2. Curvilinear density previously noted over the right chest no longer identified. Electronically Signed   By: Marcello Moores  Register   On: 08/05/2019 09:02   DG [place] Dys Tube Plc W/Fl-No Rad  Result Date: 08/05/2019 CLINICAL DATA:  NG tube placement. EXAM: DG NASO G TUBE PLC W/FL-NO RAD CONTRAST:  None FLUOROSCOPY TIME:  Fluoroscopy Time:  0 minutes 48 seconds Radiation Exposure Index (if provided by the fluoroscopic device): 1.0 mGy COMPARISON:  CT 08/05/2019. FINDINGS: NG tube noted with tip below left hemidiaphragm. Curvilinear wire density noted over the right chest. Distended loops of small bowel noted. IMPRESSION: NG tube noted with tip in the stomach. Electronically Signed   By: Marcello Moores  Register   On: 08/05/2019 10:00    EKG: Independently reviewed.  Sinus tachycardia.  No ST elevation or depression noted.   Assessment/Plan Principal Problem:   SBO (small bowel obstruction) ([facility]) Active Problems:   COPD (chronic obstructive pulmonary disease) ([facility])   Leukocytosis   AKI (acute kidney injury) ([facility])   Lactic acidosis   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[facility])   Small bowel obstruction: -Patient presented with abdominal pain N/V/D.  Has history of recurrent SBO -Patient had fever of 100.6, leukocytosis of 18.4, lactic acid: 3.1 trended down to 2.3.  Reviewed CT abdomen/pelvis result, chest x-ray result.  Lipase: WNL, COVID-19 negative. -Patient received IV fluids, fentanyl in ED. -EDP consulted general surgery recommended medical management for now.  N.p.o./NG tube/SBO protocol -Admit patient on the floor.  Check magnesium. -Tylenol/oxycodone/Dilaudid as needed for mild/moderate/severe pain respectively. -Zofran as needed for nausea and vomiting. -Patient is febrile with leukocytosis and elevated lactic acid-we will start on IV Zosyn.  Blood culture is obtained and is pending. -Monitor vitals  closely.  Chronic hypoxemic respiratory failure secondary to underlying COPD: Stable -Continue on 2 L of oxygen via nasal cannula. -No wheezing noted on exam.  Chest x-ray negative for pneumonia.  COVID-19 negative  AKI: Likely secondary to dehydration -Continue IV fluids.  Monitor kidney function closely.  Avoid nephrotoxic medication  Dehydration: -Patient urine is positive for ketones, lactic acid elevated -Continue IV fluids.  Leukocytosis: -Secondary to #1.  Blood culture is obtained and is pending.  Continue Zosyn.  DVT prophylaxis: Lovenox/SCD Code Status: Full code-confirmed with the patient Family Communication: None present at bedside.  Plan of care discussed with patient in length and he verbalized understanding and agreed with it. Disposition Plan: To be determined Consults called: General surgery by EDP Admission status: Inpatient   Mckinley Jewel MD [facility]  If 7PM-7AM, please contact night-coverage [email or web address] Password  [email or web address]  08/05/2019, 2:05 PM

## 2019-08-05 NOTE — ED Notes (Addendum)
Pt reconnected to intermittent suction, now 1 hr post admin gastrografin

## 2019-08-06 ENCOUNTER — Inpatient Hospital Stay (HOSPITAL_COMMUNITY): Payer: Medicare Other

## 2019-08-06 LAB — COMPREHENSIVE METABOLIC PANEL
ALT: 15 U/L (ref 0–44)
AST: 17 U/L (ref 15–41)
Albumin: 3.3 g/dL — ABNORMAL LOW (ref 3.5–5.0)
Alkaline Phosphatase: 41 U/L (ref 38–126)
Anion gap: 10 (ref 5–15)
BUN: 26 mg/dL — ABNORMAL HIGH (ref 8–23)
CO2: 27 mmol/L (ref 22–32)
Calcium: 8.7 mg/dL — ABNORMAL LOW (ref 8.9–10.3)
Chloride: 107 mmol/L (ref 98–111)
Creatinine, Ser: 1.29 mg/dL — ABNORMAL HIGH (ref 0.61–1.24)
GFR calc Af Amer: >60 mL/min (ref >60)
GFR calc non Af Amer: 53 mL/min — ABNORMAL LOW (ref >60)
Glucose, Bld: 102 mg/dL — ABNORMAL HIGH (ref 70–99)
Potassium: 4.2 mmol/L (ref 3.5–5.1)
Sodium: 144 mmol/L (ref 135–145)
Total Bilirubin: 1.8 mg/dL — ABNORMAL HIGH (ref 0.3–1.2)
Total Protein: 6 g/dL — ABNORMAL LOW (ref 6.5–8.1)

## 2019-08-06 LAB — CBC
HCT: 41.3 % (ref 39.0–52.0)
Hemoglobin: 13.1 g/dL (ref 13.0–17.0)
MCH: 31.2 pg (ref 26.0–34.0)
MCHC: 31.7 g/dL (ref 30.0–36.0)
MCV: 98.3 fL (ref 80.0–100.0)
Platelets: 244 10*3/uL (ref 150–400)
RBC: 4.2 MIL/uL — ABNORMAL LOW (ref 4.22–5.81)
RDW: 13.3 % (ref 11.5–15.5)
WBC: 10.9 10*3/uL — ABNORMAL HIGH (ref 4.0–10.5)
nRBC: 0 % (ref 0.0–0.2)

## 2019-08-06 MED ORDER — MORPHINE SULFATE (PF) 2 MG/ML IV SOLN
2.0000 mg | INTRAVENOUS | Status: DC | PRN
  Administered 2019-08-06 – 2019-08-08 (×4): 2 mg via INTRAVENOUS
  Filled 2019-08-06 (×4): qty 1

## 2019-08-06 MED ORDER — LACTATED RINGERS IV SOLN: INTRAVENOUS | Status: AC

## 2019-08-06 NOTE — Progress Notes (Addendum)
PROGRESS NOTE    MICHALL NOFFKE  KWI:[id number] DOB: December 13, 1940 DOA: 08/04/2019 PCP: [name], FNP   Chief Complaint  Patient presents with  . Abdominal Pain    Brief Narrative:  RIOT WATERWORTH is Loye Reininger 79 y.o. 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  Patient tells me that his symptoms started 3 days ago.  Reports generalized abdominal pain associated with nausea, vomiting, nonbloody diarrhea and abdominal distention.  Reports pain is currently 5 out of 10, nonradiating, no aggravating or relieving factors, achy in nature.  He tells me that he had similar symptoms when he had SBO in the past.  He had last bowel movement yesterday morning.  He has been passing gas however denies fever, chills, decreased appetite, generalized weakness, lethargy.  No history of headache, blurry vision, chest pain, shortness of breath, wheezing, leg swelling, orthopnea, PND, urinary or sleep changes.  He had one SBO which was treated with exploratory laparotomy and 3 SBO's treated with medical management and resolved.  He lives with his wife at home.  No history of smoking, alcohol, licit drug use.   Assessment & Plan:   Principal Problem:   SBO (small bowel obstruction) (HCC) Active Problems:   COPD (chronic obstructive pulmonary disease) (HCC)   Leukocytosis   AKI (acute kidney injury) (HCC)   Lactic acidosis   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HCC)  Small bowel obstruction: -Patient presented with abdominal pain N/V/D.  Has history of recurrent SBO -CT abd/pelvis with SBO involving proximal to mid jejunum -General surgery c/s, NPO, NGT, SBO protocol, ice  chips for now - APAP, oxy, morphine prn pain - Most recent abdominal plain film with colonic contrast with persistent SBO, c/w partial SBO  Systemic Inflammatory Response Syndrome  Fever  Leukocytosis: fever, leukocytosis, elevated lactate in setting of SBO.  No clear cause for fever on CT or CXR.   CXR with possible hazy opacity at R lung base  Follow blood cx UA not c/w UTI Continue zosyn for now  Chronic hypoxemic respiratory failure secondary to underlying COPD: Stable -Continue on 2 L of oxygen via nasal cannula. -No wheezing noted on exam.  Chest x-ray as above.  AKI: Likely secondary to dehydration -Continue IV fluids.  Monitor kidney function closely.  Avoid nephrotoxic medication  Dehydration: -Patient urine is positive for ketones, lactic acid elevated -Continue IV fluids.  Elevated Bilirubin: improving, follow.  CT abdomen/pelvis without focal liver abnormality, s/p cholecystectomy.  DVT prophylaxis: lovenox Code Status: full  Family Communication: none at bedside Disposition:   Status is: Inpatient  Remains inpatient appropriate because:Inpatient level of care appropriate due to severity of illness   Dispo: The patient is from: Home              Anticipated d/c is to: pending              Anticipated d/c date is: > 3 days              Patient currently is not medically stable to d/c.   Consultants:   surgery  Procedures: none  Antimicrobials:  Anti-infectives (From admission, onward)   Start     Dose/Rate Route Frequency Ordered Stop   08/05/19 2300  piperacillin-tazobactam (ZOSYN) IVPB 3.375  g     Discontinue    "Followed by" Linked Group Details   3.375 g 12.5 mL/hr over 240 Minutes Intravenous Every 8 hours 08/05/19 1433     08/05/19 1445  piperacillin-tazobactam (ZOSYN) IVPB 3.375 g       "Followed by" Linked Group Details   3.375 g 100 mL/hr over 30 Minutes Intravenous  Once 08/05/19 1433 08/05/19 1656     Subjective: Doing ok, no new  complaints Passing gas, no BM  Objective: Vitals:   08/06/19 0456 08/06/19 0457 08/06/19 0500 08/06/19 1403  BP: 127/70   124/66  Pulse: 85 78  74  Resp: 17   18  Temp: 98.1 F (36.7 C)   98.4 F (36.9 C)  TempSrc: Oral   Oral  SpO2: (!) 87% 91%  96%  Weight:   65.7 kg     Intake/Output Summary (Last 24 hours) at 08/06/2019 1820 Last data filed at 08/06/2019 1800 Gross per 24 hour  Intake 2476.39 ml  Output 1750 ml  Net 726.39 ml   Filed Weights   08/06/19 0500  Weight: 65.7 kg    Examination:  General exam: Appears calm and comfortable  Respiratory system: Clear to auscultation. Respiratory effort normal. Cardiovascular system: S1 & S2 heard, RRR.  Gastrointestinal system: Abdomen is mildly distended and mildly tender.  NG in place. Central nervous system: Alert and oriented. No focal neurological deficits. Extremities: no LEE Skin: No rashes, lesions or ulcers Psychiatry: Judgement and insight appear normal. Mood & affect appropriate.     Data Reviewed: I have personally reviewed following labs and imaging studies  CBC: Recent Labs  Lab 08/04/19 1730 08/05/19 1432 08/06/19 0151  WBC 18.4* 12.6* 10.9*  NEUTROABS  --  10.4*  --   HGB 15.6 14.7 13.1  HCT 48.0 45.3 41.3  MCV 95.6 96.2 98.3  PLT 312 259 671    Basic Metabolic Panel: Recent Labs  Lab 08/04/19 1730 08/05/19 1432 08/06/19 0151  NA 142  --  144  K 4.0  --  4.2  CL 101  --  107  CO2 27  --  27  GLUCOSE 193*  --  102*  BUN 21  --  26*  CREATININE 1.37* 1.06 1.29*  CALCIUM 10.2  --  8.7*  MG  --  2.2  --   PHOS  --  3.6  --     GFR: CrCl cannot be calculated (Unknown ideal weight.).  Liver Function Tests: Recent Labs  Lab 08/04/19 1730 08/06/19 0151  AST 22 17  ALT 17 15  ALKPHOS 54 41  BILITOT 2.7* 1.8*  PROT 7.6 6.0*  ALBUMIN 3.9 3.3*    CBG: No results for input(s): GLUCAP in the last 168 hours.   Recent Results (from the past 240 hour(s))  SARS Coronavirus 2 by RT  PCR (hospital order, performed in [facility] hospital lab) Nasopharyngeal Nasopharyngeal Swab     Status: None   Collection Time: 08/05/19  3:57 AM   Specimen: Nasopharyngeal Swab  Result Value Ref Range Status   SARS Coronavirus 2 NEGATIVE NEGATIVE Final    Comment: (NOTE) SARS-CoV-2 target nucleic acids are NOT DETECTED.  The SARS-CoV-2 RNA is generally detectable in upper and lower respiratory specimens during the acute phase of infection. The lowest concentration of SARS-CoV-2 viral copies this assay can detect is 250 copies / mL. Aydia Maj negative result does not preclude SARS-CoV-2 infection and should not be used as the sole basis for treatment or other  patient management decisions.  Arrian Manson negative result may occur with improper specimen collection / handling, submission of specimen other than nasopharyngeal swab, presence of viral mutation(s) within the areas targeted by this assay, and inadequate number of viral copies (<250 copies / mL). Chaia Ikard negative result must be combined with clinical observations, patient history, and epidemiological information.  Fact Sheet for Patients:   StrictlyIdeas.no  Fact Sheet for Healthcare Providers: BankingDealers.co.za  This test is not yet approved or  cleared by the [place] FDA and has been authorized for detection and/or diagnosis of SARS-CoV-2 by FDA under an Emergency Use Authorization (EUA).  This EUA will remain in effect (meaning this test can be used) for the duration of the COVID-19 declaration under Section 564(b)(1) of the Act, 21 U.S.C. section 360bbb-3(b)(1), unless the authorization is terminated or revoked sooner.  Performed at [facility], [place] [address], [place], [place] [address]   Blood culture (routine x 2)     Status: None (Preliminary result)   Collection Time: 08/05/19  2:27 PM   Specimen: BLOOD  Result Value Ref Range Status   Specimen Description BLOOD LEFT  ANTECUBITAL  Final   Special Requests   Final    BOTTLES DRAWN AEROBIC AND ANAEROBIC Blood Culture results may not be optimal due to an excessive volume of blood received in culture bottles   Culture   Final    NO GROWTH < 24 HOURS Performed at [facility], [place] [address], [place], [place] [address]    Report Status PENDING  Incomplete  Blood culture (routine x 2)     Status: None (Preliminary result)   Collection Time: 08/05/19  2:32 PM   Specimen: BLOOD RIGHT WRIST  Result Value Ref Range Status   Specimen Description BLOOD RIGHT WRIST  Final   Special Requests   Final    BOTTLES DRAWN AEROBIC AND ANAEROBIC Blood Culture results may not be optimal due to an inadequate volume of blood received in culture bottles   Culture   Final    NO GROWTH < 24 HOURS Performed at [facility], [place] [address], [place], [place] [address]    Report Status PENDING  Incomplete         Radiology Studies: DG Abd 1 View  Result Date: 08/05/2019 CLINICAL DATA:  Provided history: 61 French NG tube placed under fluoro after multiple attempts at that side. 18 seconds fluoroscopy time. EXAM: ABDOMEN - 1 VIEW COMPARISON:  CT abdomen/pelvis 08/05/2019 FINDINGS: Problem oriented examination an enteric tube passes below the level of the left hemidiaphragm and coils in the region of the stomach with tip projecting in the region of the cardiac fundus. An additional small curvilinear opacity projects over the right lung base, which may overlie the patient. IMPRESSION: Problem-oriented examination demonstrates NG tube with tip terminating in the region of the gastric fundus. An additional small curvilinear opacity projects over the right lung base, which may overlie the patient. Correlation is recommended. Electronically Signed   By: Kellie Simmering DO   On: 08/05/2019 08:56   CT ABDOMEN PELVIS W CONTRAST  Result Date: 08/05/2019 CLINICAL DATA:  Abdominal pain, nausea and vomiting, diarrhea EXAM: CT ABDOMEN  AND PELVIS WITH CONTRAST TECHNIQUE: Multidetector CT imaging of the abdomen and pelvis was performed using the standard protocol following bolus administration of intravenous contrast. CONTRAST:  145mL OMNIPAQUE IOHEXOL 300 MG/ML  SOLN COMPARISON:  04/21/2017 FINDINGS: Lower chest: Background emphysema without acute pleural or parenchymal lung disease.  Hepatobiliary: Stable hepatic steatosis. No focal liver abnormality. Gallbladder surgically absent. Pancreas: Unremarkable. No pancreatic ductal dilatation or surrounding inflammatory changes. Spleen: Normal in size without focal abnormality. Adrenals/Urinary Tract: Stable left renal cyst. No urinary tract calculi or obstructive uropathy. Bladder is unremarkable. The adrenals are normal. Stomach/Bowel: There is marked distension of the proximal small bowel measuring up to 5.1 cm in diameter. Focal wall thickening within Kelita Wallis segment of jejunum within the left mid abdomen reference image 52 is in the area where there is gradual transition from dilated to nondilated bowel. The distal jejunum and ileum are decompressed. Minimal gas and stool throughout the colon. There is colonic diverticulosis without diverticulitis. Vascular/Lymphatic: Aortic atherosclerosis. No enlarged abdominal or pelvic lymph nodes. Reproductive: Prostate is unremarkable. Other: No free fluid or free gas. Small Richter hernia at the umbilicus containing Jesselle Laflamme portion of the anti mesenteric wall of the mid jejunum. Musculoskeletal: No acute or destructive bony lesions. Reconstructed images demonstrate no additional findings. IMPRESSION: 1. Small bowel obstruction involving the proximal to mid jejunum, with transition point corresponding to an area of segmental wall thickening within the mid jejunum. 2. Stable hepatic steatosis. 3. Diverticulosis without diverticulitis. 4. Richter umbilical hernia containing Everest Brod portion of the jejunum. No bowel wall thickening or incarceration. 5. Aortic Atherosclerosis  (ICD10-I70.0) and Emphysema (ICD10-J43.9). Electronically Signed   By: Randa Ngo M.D.   On: 08/05/2019 03:33   DG Chest Portable 1 View  Result Date: 08/05/2019 CLINICAL DATA:  Fever EXAM: PORTABLE CHEST 1 VIEW COMPARISON:  06/06/2016, abdominal radiograph 08/05/2019 FINDINGS: Esophageal tube tip is in the left upper quadrant. Probable small left pleural effusion. Atelectasis at the left base. Hazy atelectasis or minor infiltrate at the right base. Stable cardiomediastinal silhouette with aortic atherosclerosis. No pneumothorax. IMPRESSION: Suspected small left pleural effusion with basilar atelectasis. Hazy opacity at the right lung base, favor atelectasis over infiltrate. Electronically Signed   By: Donavan Foil M.D.   On: 08/05/2019 15:46   DG Abd Portable 1V-Small Bowel Obstruction Protocol-24 hr delay  Result Date: 08/06/2019 CLINICAL DATA:  Follow up small bowel obstruction EXAM: PORTABLE ABDOMEN - 1 VIEW COMPARISON:  08/05/2019 FINDINGS: Gastric catheter is noted coiled within the stomach. Persistent small bowel dilatation is noted although contrast material has passed through to the colon indicating Vinnie Gombert partial small bowel obstruction. IMPRESSION: Colonic contrast is now seen with persistent small bowel dilatation. These changes are consistent with Jerrard Bradburn partial small bowel obstruction unchanged from the prior study. Electronically Signed   By: Inez Catalina M.D.   On: 08/06/2019 12:09   DG Abd Portable 1V-Small Bowel Obstruction Protocol-initial, 8 hr delay  Result Date: 08/05/2019 CLINICAL DATA:  Bowel obstruction EXAM: PORTABLE ABDOMEN - 1 VIEW COMPARISON:  08/05/2019, CT 08/05/2019 FINDINGS: The upper abdomen and diaphragm are incompletely imaged. Contrast visible within the stomach. Dilute contrast within dilated central small bowel. Small bowel is dilated up to 6.3 cm. No contrast present in the colon. Contrast within the urinary bladder. IMPRESSION: Dilute contrast present within the stomach  and dilated small bowel consistent with bowel obstruction. No definitive colon contrast is visualized Electronically Signed   By: Donavan Foil M.D.   On: 08/05/2019 19:07   DG Abd Portable 1V-Small Bowel Protocol-Position Verification  Result Date: 08/05/2019 CLINICAL DATA:  NG tube placement. EXAM: PORTABLE ABDOMEN - 1 VIEW COMPARISON:  Prior abdomen and CT 08/05/2019. FINDINGS: NG tube noted with tip coiled in stomach. Distended loops of small bowel noted consistent previously identified small-bowel obstruction. Curvilinear density  previously noted over the right chest no longer identified. IMPRESSION: NG tube noted with tip coiled in stomach. Distended loops of small bowel noted with previously identified small bowel obstruction. 2. Curvilinear density previously noted over the right chest no longer identified. Electronically Signed   By: Marcello Moores  Register   On: 08/05/2019 09:02   DG Loyce Dys Tube Plc W/Fl-No Rad  Result Date: 08/05/2019 CLINICAL DATA:  NG tube placement. EXAM: DG NASO G TUBE PLC W/FL-NO RAD CONTRAST:  None FLUOROSCOPY TIME:  Fluoroscopy Time:  0 minutes 48 seconds Radiation Exposure Index (if provided by the fluoroscopic device): 1.0 mGy COMPARISON:  CT 08/05/2019. FINDINGS: NG tube noted with tip below left hemidiaphragm. Curvilinear wire density noted over the right chest. Distended loops of small bowel noted. IMPRESSION: NG tube noted with tip in the stomach. Electronically Signed   By: Marcello Moores  Register   On: 08/05/2019 10:00        Scheduled Meds: . enoxaparin (LOVENOX) injection  40 mg Subcutaneous Q24H  . sodium chloride flush  3 mL Intravenous Once  . umeclidinium bromide  1 puff Inhalation Daily   Continuous Infusions: . sodium chloride Stopped (08/05/19 1518)  . sodium chloride 100 mL/hr at 08/05/19 1917  . piperacillin-tazobactam (ZOSYN)  IV 3.375 g (08/06/19 1533)     LOS: 1 day    Time spent: ovre 30 min    Fayrene Helper, MD [facility]   To  contact the attending provider between 7A-7P or the covering provider during after hours 7P-7A, please log into the web site [email or web address] and access using universal [place] password for that web site. If you do not have the password, please call the hospital operator.  08/06/2019, 6:20 PM

## 2019-08-06 NOTE — Plan of Care (Signed)
  Problem: Education: Goal: Knowledge of General Education information will improve Description: Including pain rating scale, medication(s)/side effects and non-pharmacologic comfort measures Outcome: Progressing   Problem: Elimination: Goal: Will not experience complications related to bowel motility Outcome: Not Progressing   Problem: Pain Managment: Goal: General experience of comfort will improve Outcome: Progressing

## 2019-08-06 NOTE — Progress Notes (Signed)
Subjective/Chief Complaint: I feel better  Some flatus no BM    Objective: Vital signs in last 24 hours: Temp:  [97.7 F (36.5 C)-98.8 F (37.1 C)] 98.1 F (36.7 C) (07/02 0456) Pulse Rate:  [78-94] 78 (07/02 0457) Resp:  [16-20] 17 (07/02 0456) BP: (123-161)/(67-89) 127/70 (07/02 0456) SpO2:  [87 %-95 %] 91 % (07/02 0457) Weight:  [65.7 kg] 65.7 kg (07/02 0500) Last BM Date: 08/03/19  Intake/Output from previous day: 07/01 0701 - 07/02 0700 In: 2820.8 [I.V.:2720.8; IV Piggyback:100] Out: 1750 [Urine:450; Emesis/NG output:1300] Intake/Output this shift: No intake/output data recorded.   General: NAD HEENT: head is normocephalic, atraumatic.  Sclera are noninjected.  PERRL.  Ears and nose without any masses or lesions.  Mouth is pink and moist. Dentition fair Heart: regular, rate, and rhythm.  Normal s1,s2. No obvious murmurs, gallops, or rubs noted.  Palpable pedal pulses bilaterally  Lungs: Expiratory wheezing throughout. No rhonchi, or rales noted.  Respiratory effort nonlabored. On 2L Abd: Soft, mild distension, some tenderness of the RLQ without rebound, rigidity or guarding. No peritonitis. +BS, no masses, hernias, or organomegaly. Prior laparoscopic and laparotomy incisions are well healed.  MS: no BUE/BLE edema, calves soft and nontender Skin: warm and dry with no masses, lesions, or rashes Psych: A&Ox4 with an appropriate affect Neuro: cranial nerves grossly intact, equal strength in BUE/BLE bilaterally, normal speech, though process intact   Lab Results:  Recent Labs    08/05/19 1432 08/06/19 0151  WBC 12.6* 10.9*  HGB 14.7 13.1  HCT 45.3 41.3  PLT 259 244   BMET Recent Labs    08/04/19 1730 08/04/19 1730 08/05/19 1432 08/06/19 0151  NA 142  --   --  144  K 4.0  --   --  4.2  CL 101  --   --  107  CO2 27  --   --  27  GLUCOSE 193*  --   --  102*  BUN 21  --   --  26*  CREATININE 1.37*   < > 1.06 1.29*  CALCIUM 10.2  --   --  8.7*   < > =  values in this interval not displayed.   PT/INR No results for input(s): LABPROT, INR in the last 72 hours. ABG No results for input(s): PHART, HCO3 in the last 72 hours.  Invalid input(s): PCO2, PO2  Studies/Results: DG Abd 1 View  Result Date: 08/05/2019 CLINICAL DATA:  Provided history: 70 French NG tube placed under fluoro after multiple attempts at that side. 18 seconds fluoroscopy time. EXAM: ABDOMEN - 1 VIEW COMPARISON:  CT abdomen/pelvis 08/05/2019 FINDINGS: Problem oriented examination an enteric tube passes below the level of the left hemidiaphragm and coils in the region of the stomach with tip projecting in the region of the cardiac fundus. An additional small curvilinear opacity projects over the right lung base, which may overlie the patient. IMPRESSION: Problem-oriented examination demonstrates NG tube with tip terminating in the region of the gastric fundus. An additional small curvilinear opacity projects over the right lung base, which may overlie the patient. Correlation is recommended. Electronically Signed   By: Kellie Simmering DO   On: 08/05/2019 08:56   CT ABDOMEN PELVIS W CONTRAST  Result Date: 08/05/2019 CLINICAL DATA:  Abdominal pain, nausea and vomiting, diarrhea EXAM: CT ABDOMEN AND PELVIS WITH CONTRAST TECHNIQUE: Multidetector CT imaging of the abdomen and pelvis was performed using the standard protocol following bolus administration of intravenous contrast. CONTRAST:  145mL OMNIPAQUE  IOHEXOL 300 MG/ML  SOLN COMPARISON:  04/21/2017 FINDINGS: Lower chest: Background emphysema without acute pleural or parenchymal lung disease. Hepatobiliary: Stable hepatic steatosis. No focal liver abnormality. Gallbladder surgically absent. Pancreas: Unremarkable. No pancreatic ductal dilatation or surrounding inflammatory changes. Spleen: Normal in size without focal abnormality. Adrenals/Urinary Tract: Stable left renal cyst. No urinary tract calculi or obstructive uropathy. Bladder is  unremarkable. The adrenals are normal. Stomach/Bowel: There is marked distension of the proximal small bowel measuring up to 5.1 cm in diameter. Focal wall thickening within a segment of jejunum within the left mid abdomen reference image 52 is in the area where there is gradual transition from dilated to nondilated bowel. The distal jejunum and ileum are decompressed. Minimal gas and stool throughout the colon. There is colonic diverticulosis without diverticulitis. Vascular/Lymphatic: Aortic atherosclerosis. No enlarged abdominal or pelvic lymph nodes. Reproductive: Prostate is unremarkable. Other: No free fluid or free gas. Small Richter hernia at the umbilicus containing a portion of the anti mesenteric wall of the mid jejunum. Musculoskeletal: No acute or destructive bony lesions. Reconstructed images demonstrate no additional findings. IMPRESSION: 1. Small bowel obstruction involving the proximal to mid jejunum, with transition point corresponding to an area of segmental wall thickening within the mid jejunum. 2. Stable hepatic steatosis. 3. Diverticulosis without diverticulitis. 4. 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 No bowel wall thickening or incarceration. 5. Aortic Atherosclerosis (ICD10-I70.0) and Emphysema (ICD10-J43.9). Electronically Signed   By: Randa Ngo M.D.   On: 08/05/2019 03:33   DG Chest Portable 1 View  Result Date: 08/05/2019 CLINICAL DATA:  Fever EXAM: PORTABLE CHEST 1 VIEW COMPARISON:  06/06/2016, abdominal radiograph 08/05/2019 FINDINGS: Esophageal tube tip is in the left upper quadrant. Probable small left pleural effusion. Atelectasis at the left base. Hazy atelectasis or minor infiltrate at the right base. Stable cardiomediastinal silhouette with aortic atherosclerosis. No pneumothorax. IMPRESSION: Suspected small left pleural effusion with basilar atelectasis. Hazy opacity at the right lung base, favor atelectasis over infiltrate. Electronically Signed    By: Donavan Foil M.D.   On: 08/05/2019 15:46   DG Abd Portable 1V-Small Bowel Obstruction Protocol-initial, 8 hr delay  Result Date: 08/05/2019 CLINICAL DATA:  Bowel obstruction EXAM: PORTABLE ABDOMEN - 1 VIEW COMPARISON:  08/05/2019, CT 08/05/2019 FINDINGS: The upper abdomen and diaphragm are incompletely imaged. Contrast visible within the stomach. Dilute contrast within dilated central small bowel. Small bowel is dilated up to 6.3 cm. No contrast present in the colon. Contrast within the urinary bladder. IMPRESSION: Dilute contrast present within the stomach and dilated small bowel consistent with bowel obstruction. No definitive colon contrast is visualized Electronically Signed   By: Donavan Foil M.D.   On: 08/05/2019 19:07   DG Abd Portable 1V-Small Bowel Protocol-Position Verification  Result Date: 08/05/2019 CLINICAL DATA:  NG tube placement. EXAM: PORTABLE ABDOMEN - 1 VIEW COMPARISON:  Prior abdomen and CT 08/05/2019. FINDINGS: NG tube noted with tip coiled in stomach. Distended loops of small bowel noted consistent previously identified small-bowel obstruction. Curvilinear density previously noted over the right chest no longer identified. IMPRESSION: NG tube noted with tip coiled in stomach. Distended loops of small bowel noted with previously identified small bowel obstruction. 2. Curvilinear density previously noted over the right chest no longer identified. Electronically Signed   By: Marcello Moores  Register   On: 08/05/2019 09:02   DG Loyce Dys Tube Plc W/Fl-No Rad  Result Date: 08/05/2019 CLINICAL DATA:  NG tube placement. EXAM: DG NASO G TUBE PLC W/FL-NO RAD  CONTRAST:  None FLUOROSCOPY TIME:  Fluoroscopy Time:  0 minutes 48 seconds Radiation Exposure Index (if provided by the fluoroscopic device): 1.0 mGy COMPARISON:  CT 08/05/2019. FINDINGS: NG tube noted with tip below left hemidiaphragm. Curvilinear wire density noted over the right chest. Distended loops of small bowel noted. IMPRESSION: NG  tube noted with tip in the stomach. Electronically Signed   By: Marcello Moores  Register   On: 08/05/2019 10:00    Anti-infectives: Anti-infectives (From admission, onward)   Start     Dose/Rate Route Frequency Ordered Stop   08/05/19 2300  piperacillin-tazobactam (ZOSYN) IVPB 3.375 g     Discontinue    "Followed by" Linked Group Details   3.375 g 12.5 mL/hr over 240 Minutes Intravenous Every 8 hours 08/05/19 1433     08/05/19 1445  piperacillin-tazobactam (ZOSYN) IVPB 3.375 g       "Followed by" Linked Group Details   3.375 g 100 mL/hr over 30 Minutes Intravenous  Once 08/05/19 1433 08/05/19 1656      Assessment/Plan: COPD on 2L o2  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 AKI -1.29    SBO - Hx prior exploratory laparotomy, umbilical hernia repair, laparoscopic appendectomy, and laparoscopic cholecystectomy - No indication for emergency surgery - NPO, NGT, SBO protocol  - Keep K>4 and Mg >2 for bowel function - Mobilize for bowel function KUB today  keep on ice chips for now   FEN - NPO, NGT VTE - SCDs, okay for chemical prophylaxis from a general surgery standpoint  ID - None    LOS: 1 day    Turner Daniels MD  08/06/2019

## 2019-08-07 LAB — COMPREHENSIVE METABOLIC PANEL
ALT: 17 U/L (ref 0–44)
AST: 21 U/L (ref 15–41)
Albumin: 2.8 g/dL — ABNORMAL LOW (ref 3.5–5.0)
Alkaline Phosphatase: 35 U/L — ABNORMAL LOW (ref 38–126)
Anion gap: 8 (ref 5–15)
BUN: 21 mg/dL (ref 8–23)
CO2: 28 mmol/L (ref 22–32)
Calcium: 8 mg/dL — ABNORMAL LOW (ref 8.9–10.3)
Chloride: 108 mmol/L (ref 98–111)
Creatinine, Ser: 1.06 mg/dL (ref 0.61–1.24)
GFR calc Af Amer: >60 mL/min (ref >60)
GFR calc non Af Amer: >60 mL/min (ref >60)
Glucose, Bld: 82 mg/dL (ref 70–99)
Potassium: 4.5 mmol/L (ref 3.5–5.1)
Sodium: 144 mmol/L (ref 135–145)
Total Bilirubin: 1.6 mg/dL — ABNORMAL HIGH (ref 0.3–1.2)
Total Protein: 5.3 g/dL — ABNORMAL LOW (ref 6.5–8.1)

## 2019-08-07 LAB — MAGNESIUM: Magnesium: 2 mg/dL (ref 1.7–2.4)

## 2019-08-07 LAB — PHOSPHORUS: Phosphorus: 2.8 mg/dL (ref 2.5–4.6)

## 2019-08-07 MED ORDER — SODIUM CHLORIDE 0.9 % IV SOLN
3.0000 g | Freq: Four times a day (QID) | INTRAVENOUS | Status: DC
  Administered 2019-08-07 – 2019-08-08 (×4): 3 g via INTRAVENOUS
  Filled 2019-08-07: qty 8
  Filled 2019-08-07 (×2): qty 3
  Filled 2019-08-07: qty 8
  Filled 2019-08-07: qty 3
  Filled 2019-08-07: qty 0.15

## 2019-08-07 NOTE — Progress Notes (Signed)
PROGRESS NOTE    [name]  TGG:[id number] DOB: 1940-04-20 DOA: 08/04/2019 PCP: [name], FNP   Chief Complaint  Patient presents with  . Abdominal Pain    Brief Narrative:  Lonnie Schaefer is Lonnie Schaefer 79 y.o. 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  Patient tells me that his symptoms started 3 days ago.  Reports generalized abdominal pain associated with nausea, vomiting, nonbloody diarrhea and abdominal distention.  Reports pain is currently 5 out of 10, nonradiating, no aggravating or relieving factors, achy in nature.  He tells me that he had similar symptoms when he had SBO in the past.  He had last bowel movement yesterday morning.  He has been passing gas however denies fever, chills, decreased appetite, generalized weakness, lethargy.  No history of headache, blurry vision, chest pain, shortness of breath, wheezing, leg swelling, orthopnea, PND, urinary or sleep changes.  He had one SBO which was treated with exploratory laparotomy and 3 SBO's treated with medical management and resolved.  He lives with his wife at home.  No history of smoking, alcohol, licit drug use.   Assessment & Plan:   Principal Problem:   SBO (small bowel obstruction) (HCC) Active Problems:   COPD (chronic obstructive pulmonary disease) (HCC)   Leukocytosis   AKI (acute kidney injury) (HCC)   Lactic acidosis   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HCC)  Small bowel obstruction: -Patient presented with abdominal pain N/V/D.  Has history of recurrent SBO -CT abd/pelvis with SBO involving proximal to mid jejunum -General surgery c/s -> trial of clears, NG clamping  trial - if he can tolerate, for 6 hrs, remove NG and advance to full/dysphagia 1 in AM - mobilize, K>4 and mag >2  - APAP, oxy, morphine prn pain - Most recent abdominal plain film with colonic contrast with persistent SBO, c/w partial SBO  Systemic Inflammatory Response Syndrome  Fever  Leukocytosis: fever, leukocytosis, elevated lactate in setting of SBO.  No clear cause for fever on CT or CXR.   CXR with possible hazy opacity at R lung base  Follow blood cx NGTDx2 UA not c/w UTI Zosyn -> narrow to unasyn - if cx continue to be negative and repeat CXR tomorrow ok (7/4), consider d/c Repeat CXR 7/4  Chronic hypoxemic respiratory failure secondary to underlying COPD: Stable -Continue on 2 L of oxygen via nasal cannula. -No wheezing noted on exam.  Chest x-ray as above.  AKI: Likely secondary to dehydration -Continue IV fluids.  Monitor kidney function closely.  Avoid nephrotoxic medication  Dehydration: -Patient urine is positive for ketones, lactic acid elevated -Continue IV fluids.  Elevated Bilirubin: improving, follow.  CT abdomen/pelvis without focal liver abnormality, s/p cholecystectomy.  DVT prophylaxis: lovenox Code Status: full  Family Communication: none at bedside Disposition:   Status is: Inpatient  Remains inpatient appropriate because:Inpatient level of care appropriate due to severity of illness   Dispo: The patient is from: Home              Anticipated d/c is to: pending              Anticipated d/c date is: > 3 days              Patient currently  is not medically stable to d/c.   Consultants:   surgery  Procedures: none  Antimicrobials:  Anti-infectives (From admission, onward)   Start     Dose/Rate Route Frequency Ordered Stop   08/05/19 2300  piperacillin-tazobactam (ZOSYN) IVPB 3.375 g     Discontinue    "Followed by" Linked Group Details   3.375 g 12.5 mL/hr over 240 Minutes Intravenous Every 8 hours 08/05/19 1433     08/05/19 1445   piperacillin-tazobactam (ZOSYN) IVPB 3.375 g       "Followed by" Linked Group Details   3.375 g 100 mL/hr over 30 Minutes Intravenous  Once 08/05/19 1433 08/05/19 1656     Subjective: Doing well with clears Passing gas, no BM Declines me calling family at this time  Objective: Vitals:   08/06/19 2043 08/07/19 0454 08/07/19 0506 08/07/19 1300  BP: 131/71  133/71 (!) 116/58  Pulse: 66  66 81  Resp: 17  18 17   Temp: 98 F (36.7 C)  (!) 97.5 F (36.4 C) 98.2 F (36.8 C)  TempSrc: Oral  Oral Oral  SpO2: 95%  95% 91%  Weight:  65.7 kg      Intake/Output Summary (Last 24 hours) at 08/07/2019 1644 Last data filed at 08/07/2019 1449 Gross per 24 hour  Intake 1848.71 ml  Output 1250 ml  Net 598.71 ml   Filed Weights   08/06/19 0500 08/07/19 0454  Weight: 65.7 kg 65.7 kg    Examination:  General: No acute distress. Cardiovascular: Heart sounds show Navneet Schmuck regular rate, and rhythm. Lungs: Clear to auscultation bilaterally  Abdomen: Soft, mildly distended, nontender Neurological: Alert and oriented 3. Moves all extremities 4. Cranial nerves II through XII grossly intact. Skin: Warm and dry. No rashes or lesions. Extremities: No clubbing or cyanosis. No edema.   Data Reviewed: I have personally reviewed following labs and imaging studies  CBC: Recent Labs  Lab 08/04/19 1730 08/05/19 1432 08/06/19 0151  WBC 18.4* 12.6* 10.9*  NEUTROABS  --  10.4*  --   HGB 15.6 14.7 13.1  HCT 48.0 45.3 41.3  MCV 95.6 96.2 98.3  PLT 312 259 785    Basic Metabolic Panel: Recent Labs  Lab 08/04/19 1730 08/05/19 1432 08/06/19 0151 08/07/19 0124  NA 142  --  144 144  K 4.0  --  4.2 4.5  CL 101  --  107 108  CO2 27  --  27 28  GLUCOSE 193*  --  102* 82  BUN 21  --  26* 21  CREATININE 1.37* 1.06 1.29* 1.06  CALCIUM 10.2  --  8.7* 8.0*  MG  --  2.2  --  2.0  PHOS  --  3.6  --  2.8    GFR: CrCl cannot be calculated (Unknown ideal weight.).  Liver Function Tests: Recent Labs    Lab 08/04/19 1730 08/06/19 0151 08/07/19 0124  AST 22 17 21   ALT 17 15 17   ALKPHOS 54 41 35*  BILITOT 2.7* 1.8* 1.6*  PROT 7.6 6.0* 5.3*  ALBUMIN 3.9 3.3* 2.8*    CBG: No results for input(s): GLUCAP in the last 168 hours.   Recent Results (from the past 240 hour(s))  SARS Coronavirus 2 by RT PCR (hospital order, performed in [facility] hospital lab) Nasopharyngeal Nasopharyngeal Swab     Status: None   Collection Time: 08/05/19  3:57 AM   Specimen: Nasopharyngeal Swab  Result Value Ref Range Status   SARS Coronavirus 2 NEGATIVE NEGATIVE Final  Comment: (NOTE) SARS-CoV-2 target nucleic acids are NOT DETECTED.  The SARS-CoV-2 RNA is generally detectable in upper and lower respiratory specimens during the acute phase of infection. The lowest concentration of SARS-CoV-2 viral copies this assay can detect is 250 copies / mL. Eular Panek negative result does not preclude SARS-CoV-2 infection and should not be used as the sole basis for treatment or other patient management decisions.  Aritzel Krusemark negative result may occur with improper specimen collection / handling, submission of specimen other than nasopharyngeal swab, presence of viral mutation(s) within the areas targeted by this assay, and inadequate number of viral copies (<250 copies / mL). Xana Bradt negative result must be combined with clinical observations, patient history, and epidemiological information.  Fact Sheet for Patients:   StrictlyIdeas.no  Fact Sheet for Healthcare Providers: BankingDealers.co.za  This test is not yet approved or  cleared by the [place] FDA and has been authorized for detection and/or diagnosis of SARS-CoV-2 by FDA under an Emergency Use Authorization (EUA).  This EUA will remain in effect (meaning this test can be used) for the duration of the COVID-19 declaration under Section 564(b)(1) of the Act, 21 U.S.C. section 360bbb-3(b)(1), unless the  authorization is terminated or revoked sooner.  Performed at [facility], [place] [address] [place], [place] [address]   Blood culture (routine x 2)     Status: None (Preliminary result)   Collection Time: 08/05/19  2:27 PM   Specimen: BLOOD  Result Value Ref Range Status   Specimen Description BLOOD LEFT ANTECUBITAL  Final   Special Requests   Final    BOTTLES DRAWN AEROBIC AND ANAEROBIC Blood Culture results may not be optimal due to an excessive volume of blood received in culture bottles   Culture   Final    NO GROWTH 2 DAYS Performed at [facility], [place] [address] [place], [place] [address]    Report Status PENDING  Incomplete  Blood culture (routine x 2)     Status: None (Preliminary result)   Collection Time: 08/05/19  2:32 PM   Specimen: BLOOD RIGHT WRIST  Result Value Ref Range Status   Specimen Description BLOOD RIGHT WRIST  Final   Special Requests   Final    BOTTLES DRAWN AEROBIC AND ANAEROBIC Blood Culture results may not be optimal due to an inadequate volume of blood received in culture bottles   Culture   Final    NO GROWTH 2 DAYS Performed at [facility], [place] [address], [place], [place] [address]    Report Status PENDING  Incomplete         Radiology Studies: DG Abd Portable 1V-Small Bowel Obstruction Protocol-24 hr delay  Result Date: 08/06/2019 CLINICAL DATA:  Follow up small bowel obstruction EXAM: PORTABLE ABDOMEN - 1 VIEW COMPARISON:  08/05/2019 FINDINGS: Gastric catheter is noted coiled within the stomach. Persistent small bowel dilatation is noted although contrast material has passed through to the colon indicating Daivik Overley partial small bowel obstruction. IMPRESSION: Colonic contrast is now seen with persistent small bowel dilatation. These changes are consistent with Nedda Gains partial small bowel obstruction unchanged from the prior study. Electronically Signed   By: Inez Catalina M.D.   On: 08/06/2019 12:09   DG Abd Portable 1V-Small Bowel  Obstruction Protocol-initial, 8 hr delay  Result Date: 08/05/2019 CLINICAL DATA:  Bowel obstruction EXAM: PORTABLE ABDOMEN - 1 VIEW COMPARISON:  08/05/2019, CT 08/05/2019 FINDINGS: The upper abdomen and diaphragm are incompletely imaged. Contrast visible within the stomach. Dilute  contrast within dilated central small bowel. Small bowel is dilated up to 6.3 cm. No contrast present in the colon. Contrast within the urinary bladder. IMPRESSION: Dilute contrast present within the stomach and dilated small bowel consistent with bowel obstruction. No definitive colon contrast is visualized Electronically Signed   By: Donavan Foil M.D.   On: 08/05/2019 19:07        Scheduled Meds: . enoxaparin (LOVENOX) injection  40 mg Subcutaneous Q24H  . sodium chloride flush  3 mL Intravenous Once  . umeclidinium bromide  1 puff Inhalation Daily   Continuous Infusions: . lactated ringers 75 mL/hr at 08/07/19 0826  . piperacillin-tazobactam (ZOSYN)  IV 3.375 g (08/07/19 1431)     LOS: 2 days    Time spent: ovre 30 min    Fayrene Helper, MD [facility]   To contact the attending provider between 7A-7P or the covering provider during after hours 7P-7A, please log into the web site [email or web address] and access using universal [place] password for that web site. If you do not have the password, please call the hospital operator.  08/07/2019, 4:44 PM

## 2019-08-07 NOTE — Progress Notes (Signed)
Lonnie Schaefer [id number] [birth year]  CARE TEAM:  PCP: [name], [place]  Outpatient Care Team: Patient Care Team: [name], [place] as PCP - General (Nurse Practitioner)  Inpatient Treatment Team: Treatment Team: Attending Provider: Elodia Florence., MD; Rounding Team: Edison Pace, Md, MD; Attending Physician: Mckinley Jewel, MD; Registered Nurse: Kennith Center, RN; Rounding Team: Clenton Pare, MD; Registered Nurse: Lucia Bitter, RN; Technician: Scot Jun, NT; Registered Nurse: Dagoberto Ligas, RN; Social Worker: Latanya Maudlin; Case Manager: Claudie Leach, RN   Problem List:   Principal Problem:   SBO (small bowel obstruction) [facility]) Active Problems:   COPD (chronic obstructive pulmonary disease) ([place])   Leukocytosis   AKI (acute kidney injury) ([place])   Lactic acidosis   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[place])      * No surgery found *      Assessment  Improving  [facility] Stay = 2 days)  Plan: COPD on 2L o2  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 AKI -1.29    SBO - Hx prior exploratory laparotomy, umbilical hernia repair, laparoscopic appendectomy, and laparoscopic cholecystectomy - No indication for emergency surgery -  SBO protocol shows contrast in the colon but still moderately distended.  He is feeling better.  We will split the difference and allow him to have clears with NG tube clamping trial.  If he can tolerate clear liquids with no increased return of pain or cramping x6 hours, remove NG tube.  Then may be advanced to full/dysphagia 1 in the morning. - Keep K>4 and Mg >2 for bowel function - Mobilize for bowel function.  He is very motivated to get up and walk KUB today  keep on ice chips for now   FEN -NPO, NGT VTE -SCDs, okay for chemical prophylaxis from a general surgery standpoint ID -None  20 minutes spent in review, evaluation, examination, counseling, and coordination of care.   More than 50% of that time was spent in counseling.  08/07/2019    Subjective: (Chief complaint)  Feeling better overall.  Starting to pass some gas.  Hoping to get NG tube out soon  Objective:  Vital signs:  Vitals:   08/06/19 1403 08/06/19 2043 08/07/19 0454 08/07/19 0506  BP: 124/66 131/71  133/71  Pulse: 74 66  66  Resp: 18 17  18   Temp: 98.4 F (36.9 C) 98 F (36.7 C)  (!) 97.5 F (36.4 C)  TempSrc: Oral Oral  Oral  SpO2: 96% 95%  95%  Weight:   65.7 kg     Last BM Date: 08/03/19  Intake/Output   Yesterday:  07/02 0701 - 07/03 0700 In: 1899.3 [P.O.:60; I.V.:1650.6; NG/GT:30; IV Piggyback:158.7] Out: 850 [Urine:650; Emesis/NG output:200] This shift:  No intake/output data recorded.  Bowel function:  Flatus: YES  BM:  No  Drain: Nasogastric tube with thin bilious effluent   Physical Exam:  General: Pt awake/alert in no acute distress Eyes: PERRL, normal EOM.  Sclera clear.  No icterus Neuro: CN II-XII intact w/o focal sensory/motor deficits. Lymph: No head/neck/groin lymphadenopathy Psych:  No delerium/psychosis/paranoia.  Oriented x 4 HENT: Normocephalic, Mucus membranes moist.  No thrush Neck: Supple, No tracheal deviation.  No obvious thyromegaly Chest: No pain to chest wall compression.  Good respiratory excursion.  No audible wheezing CV:  Pulses intact.  Regular rhythm.  No major extremity edema MS: Normal AROM mjr joints.  No obvious deformity  Abdomen: Somewhat firm.  Moderately distended.  Nontender.  No evidence  of peritonitis.  No incarcerated hernias.  Ext:   No deformity.  No mjr edema.  No cyanosis Skin: No petechiae / purpurea.  No major sores.  Warm and dry    Results:   Cultures: Recent Results (from the past 720 hour(s))  SARS Coronavirus 2 by RT PCR (hospital order, performed in [facility] hospital lab) Nasopharyngeal Nasopharyngeal Swab     Status: None   Collection Time: 08/05/19  3:57 AM   Specimen: Nasopharyngeal  Swab  Result Value Ref Range Status   SARS Coronavirus 2 NEGATIVE NEGATIVE Final    Comment: (NOTE) SARS-CoV-2 target nucleic acids are NOT DETECTED.  The SARS-CoV-2 RNA is generally detectable in upper and lower respiratory specimens during the acute phase of infection. The lowest concentration of SARS-CoV-2 viral copies this assay can detect is 250 copies / mL. A negative result does not preclude SARS-CoV-2 infection and should not be used as the sole basis for treatment or other patient management decisions.  A negative result may occur with improper specimen collection / handling, submission of specimen other than nasopharyngeal swab, presence of viral mutation(s) within the areas targeted by this assay, and inadequate number of viral copies (<250 copies / mL). A negative result must be combined with clinical observations, patient history, and epidemiological information.  Fact Sheet for Patients:   StrictlyIdeas.no  Fact Sheet for Healthcare Providers: BankingDealers.co.za  This test is not yet approved or  cleared by the [place] FDA and has been authorized for detection and/or diagnosis of SARS-CoV-2 by FDA under an Emergency Use Authorization (EUA).  This EUA will remain in effect (meaning this test can be used) for the duration of the COVID-19 declaration under Section 564(b)(1) of the Act, 21 U.S.C. section 360bbb-3(b)(1), unless the authorization is terminated or revoked sooner.  Performed at [facility], [place] [address], [place], [place] [address]   Blood culture (routine x 2)     Status: None (Preliminary result)   Collection Time: 08/05/19  2:27 PM   Specimen: BLOOD  Result Value Ref Range Status   Specimen Description BLOOD LEFT ANTECUBITAL  Final   Special Requests   Final    BOTTLES DRAWN AEROBIC AND ANAEROBIC Blood Culture results may not be optimal due to an excessive volume of blood received in  culture bottles   Culture   Final    NO GROWTH < 24 HOURS Performed at [facility], [place] [address] [place], [place] [address]    Report Status PENDING  Incomplete  Blood culture (routine x 2)     Status: None (Preliminary result)   Collection Time: 08/05/19  2:32 PM   Specimen: BLOOD RIGHT WRIST  Result Value Ref Range Status   Specimen Description BLOOD RIGHT WRIST  Final   Special Requests   Final    BOTTLES DRAWN AEROBIC AND ANAEROBIC Blood Culture results may not be optimal due to an inadequate volume of blood received in culture bottles   Culture   Final    NO GROWTH < 24 HOURS Performed at [facility], [place] [address] [place],  [address]    Report Status PENDING  Incomplete    Labs: Results for orders placed or performed during the hospital encounter of 08/04/19 (from the past 48 hour(s))  Blood culture (routine x 2)     Status: None (Preliminary result)   Collection Time: 08/05/19  2:27 PM   Specimen: BLOOD  Result Value Ref Range   Specimen  Description BLOOD LEFT ANTECUBITAL    Special Requests      BOTTLES DRAWN AEROBIC AND ANAEROBIC Blood Culture results may not be optimal due to an excessive volume of blood received in culture bottles   Culture      NO GROWTH < 24 HOURS Performed at [facility] [address], [place], [place] [address]    Report Status PENDING   Blood culture (routine x 2)     Status: None (Preliminary result)   Collection Time: 08/05/19  2:32 PM   Specimen: BLOOD RIGHT WRIST  Result Value Ref Range   Specimen Description BLOOD RIGHT WRIST    Special Requests      BOTTLES DRAWN AEROBIC AND ANAEROBIC Blood Culture results may not be optimal due to an inadequate volume of blood received in culture bottles   Culture      NO GROWTH < 24 HOURS Performed at [facility], [place] [address], [place], [place] [address]    Report Status PENDING   Creatinine, serum     Status: None   Collection Time: 08/05/19  2:32 PM    Result Value Ref Range   Creatinine, Ser 1.06 0.61 - 1.24 mg/dL   GFR calc non Af Amer >60 >60 mL/min   GFR calc Af Amer >60 >60 mL/min    Comment: Performed at [facility] [address], [place], [place] [address]  Magnesium     Status: None   Collection Time: 08/05/19  2:32 PM  Result Value Ref Range   Magnesium 2.2 1.7 - 2.4 mg/dL    Comment: Performed at [facility], [place] [address], [place], [place] [address]  Phosphorus     Status: None   Collection Time: 08/05/19  2:32 PM  Result Value Ref Range   Phosphorus 3.6 2.5 - 4.6 mg/dL    Comment: Performed at [facility], [place] [address], [place], [place] [address]  CBC WITH DIFFERENTIAL     Status: Abnormal   Collection Time: 08/05/19  2:32 PM  Result Value Ref Range   WBC 12.6 (H) 4.0 - 10.5 K/uL   RBC 4.71 4.22 - 5.81 MIL/uL   Hemoglobin 14.7 13.0 - 17.0 g/dL   HCT 45.3 39 - 52 %   MCV 96.2 80.0 - 100.0 fL   MCH 31.2 26.0 - 34.0 pg   MCHC 32.5 30.0 - 36.0 g/dL   RDW 13.3 11.5 - 15.5 %   Platelets 259 150 - 400 K/uL   nRBC 0.0 0.0 - 0.2 %   Neutrophils Relative % 82 %   Neutro Abs 10.4 (H) 1.7 - 7.7 K/uL   Lymphocytes Relative 9 %   Lymphs Abs 1.1 0.7 - 4.0 K/uL   Monocytes Relative 8 %   Monocytes Absolute 1.1 (H) 0 - 1 K/uL   Eosinophils Relative 0 %   Eosinophils Absolute 0.0 0 - 0 K/uL   Basophils Relative 0 %   Basophils Absolute 0.0 0 - 0 K/uL   Immature Granulocytes 1 %   Abs Immature Granulocytes 0.07 0.00 - 0.07 K/uL    Comment: Performed at [facility], [place] [address], [place],  [address]  Comprehensive metabolic panel     Status: Abnormal   Collection Time: 08/06/19  1:51 AM  Result Value Ref Range   Sodium 144 135 - 145 mmol/L   Potassium 4.2 3.5 - 5.1 mmol/L   Chloride 107 98 - 111 mmol/L  CO2 27 22 - 32 mmol/L   Glucose, Bld 102 (H) 70 - 99 mg/dL    Comment: Glucose reference range applies only to samples taken after fasting for at least 8 hours.   BUN 26 (H) 8 -  23 mg/dL   Creatinine, Ser 1.29 (H) 0.61 - 1.24 mg/dL   Calcium 8.7 (L) 8.9 - 10.3 mg/dL   Total Protein 6.0 (L) 6.5 - 8.1 g/dL   Albumin 3.3 (L) 3.5 - 5.0 g/dL   AST 17 15 - 41 U/L   ALT 15 0 - 44 U/L   Alkaline Phosphatase 41 38 - 126 U/L   Total Bilirubin 1.8 (H) 0.3 - 1.2 mg/dL   GFR calc non Af Amer 53 (L) >60 mL/min   GFR calc Af Amer >60 >60 mL/min   Anion gap 10 5 - 15    Comment: Performed at [facility] [address], [place], [place] [address]  CBC     Status: Abnormal   Collection Time: 08/06/19  1:51 AM  Result Value Ref Range   WBC 10.9 (H) 4.0 - 10.5 K/uL   RBC 4.20 (L) 4.22 - 5.81 MIL/uL   Hemoglobin 13.1 13.0 - 17.0 g/dL   HCT 41.3 39 - 52 %   MCV 98.3 80.0 - 100.0 fL   MCH 31.2 26.0 - 34.0 pg   MCHC 31.7 30.0 - 36.0 g/dL   RDW 13.3 11.5 - 15.5 %   Platelets 244 150 - 400 K/uL   nRBC 0.0 0.0 - 0.2 %    Comment: Performed at [facility], [place] [address], [place], [place] [address]  Comprehensive metabolic panel     Status: Abnormal   Collection Time: 08/07/19  1:24 AM  Result Value Ref Range   Sodium 144 135 - 145 mmol/L   Potassium 4.5 3.5 - 5.1 mmol/L   Chloride 108 98 - 111 mmol/L   CO2 28 22 - 32 mmol/L   Glucose, Bld 82 70 - 99 mg/dL    Comment: Glucose reference range applies only to samples taken after fasting for at least 8 hours.   BUN 21 8 - 23 mg/dL   Creatinine, Ser 1.06 0.61 - 1.24 mg/dL   Calcium 8.0 (L) 8.9 - 10.3 mg/dL   Total Protein 5.3 (L) 6.5 - 8.1 g/dL   Albumin 2.8 (L) 3.5 - 5.0 g/dL   AST 21 15 - 41 U/L   ALT 17 0 - 44 U/L   Alkaline Phosphatase 35 (L) 38 - 126 U/L   Total Bilirubin 1.6 (H) 0.3 - 1.2 mg/dL   GFR calc non Af Amer >60 >60 mL/min   GFR calc Af Amer >60 >60 mL/min   Anion gap 8 5 - 15    Comment: Performed at [facility] [address] [place], [place] [address]  Magnesium     Status: None   Collection Time: 08/07/19  1:24 AM  Result Value Ref Range   Magnesium 2.0 1.7 - 2.4 mg/dL     Comment: Performed at [facility] [address] [place], [place] [address]  Phosphorus     Status: None   Collection Time: 08/07/19  1:24 AM  Result Value Ref Range   Phosphorus 2.8 2.5 - 4.6 mg/dL    Comment: Performed at [facility] [address], [place],  [address]    Imaging / Studies: DG Abd 1 View  Result Date: 08/05/2019 CLINICAL DATA:  Provided history: 9 French NG tube placed under fluoro after multiple attempts at that side. 18 seconds fluoroscopy time. EXAM: ABDOMEN - 1 VIEW COMPARISON:  CT abdomen/pelvis 08/05/2019 FINDINGS: Problem oriented examination an enteric tube passes below the level of the left hemidiaphragm and coils in the region of the stomach with tip projecting in the region of the cardiac fundus. An additional small curvilinear opacity projects over the right lung base, which may overlie the patient. IMPRESSION: Problem-oriented examination demonstrates NG tube with tip terminating in the region of the gastric fundus. An additional small curvilinear opacity projects over the right lung base, which may overlie the patient. Correlation is recommended. Electronically Signed   By: Kellie Simmering DO   On: 08/05/2019 08:56   DG Chest Portable 1 View  Result Date: 08/05/2019 CLINICAL DATA:  Fever EXAM: PORTABLE CHEST 1 VIEW COMPARISON:  06/06/2016, abdominal radiograph 08/05/2019 FINDINGS: Esophageal tube tip is in the left upper quadrant. Probable small left pleural effusion. Atelectasis at the left base. Hazy atelectasis or minor infiltrate at the right base. Stable cardiomediastinal silhouette with aortic atherosclerosis. No pneumothorax. IMPRESSION: Suspected small left pleural effusion with basilar atelectasis. Hazy opacity at the right lung base, favor atelectasis over infiltrate. Electronically Signed   By: Donavan Foil M.D.   On: 08/05/2019 15:46   DG Abd Portable 1V-Small Bowel Obstruction Protocol-24 hr delay  Result Date: 08/06/2019 CLINICAL  DATA:  Follow up small bowel obstruction EXAM: PORTABLE ABDOMEN - 1 VIEW COMPARISON:  08/05/2019 FINDINGS: Gastric catheter is noted coiled within the stomach. Persistent small bowel dilatation is noted although contrast material has passed through to the colon indicating a partial small bowel obstruction. IMPRESSION: Colonic contrast is now seen with persistent small bowel dilatation. These changes are consistent with a partial small bowel obstruction unchanged from the prior study. Electronically Signed   By: Inez Catalina M.D.   On: 08/06/2019 12:09   DG Abd Portable 1V-Small Bowel Obstruction Protocol-initial, 8 hr delay  Result Date: 08/05/2019 CLINICAL DATA:  Bowel obstruction EXAM: PORTABLE ABDOMEN - 1 VIEW COMPARISON:  08/05/2019, CT 08/05/2019 FINDINGS: The upper abdomen and diaphragm are incompletely imaged. Contrast visible within the stomach. Dilute contrast within dilated central small bowel. Small bowel is dilated up to 6.3 cm. No contrast present in the colon. Contrast within the urinary bladder. IMPRESSION: Dilute contrast present within the stomach and dilated small bowel consistent with bowel obstruction. No definitive colon contrast is visualized Electronically Signed   By: Donavan Foil M.D.   On: 08/05/2019 19:07   DG Abd Portable 1V-Small Bowel Protocol-Position Verification  Result Date: 08/05/2019 CLINICAL DATA:  NG tube placement. EXAM: PORTABLE ABDOMEN - 1 VIEW COMPARISON:  Prior abdomen and CT 08/05/2019. FINDINGS: NG tube noted with tip coiled in stomach. Distended loops of small bowel noted consistent previously identified small-bowel obstruction. Curvilinear density previously noted over the right chest no longer identified. IMPRESSION: NG tube noted with tip coiled in stomach. Distended loops of small bowel noted with previously identified small bowel obstruction. 2. Curvilinear density previously noted over the right chest no longer identified. Electronically Signed   By: Marcello Moores   Register   On: 08/05/2019 09:02   DG [place] Dys Tube Plc W/Fl-No Rad  Result Date: 08/05/2019 CLINICAL DATA:  NG tube placement. EXAM: DG NASO G TUBE PLC W/FL-NO RAD CONTRAST:  None FLUOROSCOPY TIME:  Fluoroscopy Time:  0 minutes 48 seconds Radiation Exposure Index (if provided by the fluoroscopic device): 1.0 mGy COMPARISON:  CT 08/05/2019. FINDINGS:  NG tube noted with tip below left hemidiaphragm. Curvilinear wire density noted over the right chest. Distended loops of small bowel noted. IMPRESSION: NG tube noted with tip in the stomach. Electronically Signed   By: Marcello Moores  Register   On: 08/05/2019 10:00    Medications / Allergies: per chart  Antibiotics: Anti-infectives (From admission, onward)   Start     Dose/Rate Route Frequency Ordered Stop   08/05/19 2300  piperacillin-tazobactam (ZOSYN) IVPB 3.375 g     Discontinue    "Followed by" Linked Group Details   3.375 g 12.5 mL/hr over 240 Minutes Intravenous Every 8 hours 08/05/19 1433     08/05/19 1445  piperacillin-tazobactam (ZOSYN) IVPB 3.375 g       "Followed by" Linked Group Details   3.375 g 100 mL/hr over 30 Minutes Intravenous  Once 08/05/19 1433 08/05/19 1656        Note: Portions of this report may have been transcribed using voice recognition software. Every effort was made to ensure accuracy; however, inadvertent computerized transcription errors may be present.   Any transcriptional errors that result from this process are unintentional.    Adin Hector, MD, FACS, MASCRS Gastrointestinal and Minimally Invasive Surgery  [facility] [address], [place], [place] [address] [phone] Fax [phone] Main/Paging  CONTACT INFORMATION: Weekday (9AM-5PM) concerns: Call [facility] main office at [phone] Weeknight (5PM-9AM) or Weekend/Holiday concerns: Check [email or web address] for General Surgery [facility] coverage (Please, do not use SecureChat as it is not reliable communication to operating  surgeons for immediate patient care)      08/07/2019  8:20 AM

## 2019-08-07 NOTE — Progress Notes (Signed)
Pharmacy Antibiotic Note  Lonnie Schaefer is a 79 y.o. male admitted on 08/04/2019 with SBO and possible intra-abdominal infection.  Pharmacy has been consulted for ampicillin-sulbactam dosing.  Patient has been on piptazo, last dose given at 14:30. WBC down to 10.9 yesterday, afebrile (Tmax 100.6 on 7/1). Renal function improved. Cultures NGTD.  Plan:  Start amp/sulb 3g Q6 hr at 22:00 Monitor cultures, clinical status, renal fx Narrow abx as able and f/u duration    Weight: 65.7 kg (144 lb 13.5 oz)  Temp (24hrs), Avg:97.9 F (36.6 C), Min:97.5 F (36.4 C), Max:98.2 F (36.8 C)  Recent Labs  Lab 08/04/19 1730 08/05/19 0149 08/05/19 0340 08/05/19 1432 08/06/19 0151 08/07/19 0124  WBC 18.4*  --   --  12.6* 10.9*  --   CREATININE 1.37*  --   --  1.06 1.29* 1.06  LATICACIDVEN  --  3.1* 2.3*  --   --   --     CrCl cannot be calculated (Unknown ideal weight.).    No Known Allergies  Antimicrobials this admission: piptazo 7/1 >> 7/3 Amp-sulb 7/3 >>    Microbiology results: 7/1 BCx: ngtd    Thank you for allowing pharmacy to be a part of this patient's care.  Benetta Spar, PharmD, BCPS, BCCP Clinical Pharmacist  Please check AMION for all [place] phone numbers After 10:00 PM, call [place] [phone]

## 2019-08-08 ENCOUNTER — Inpatient Hospital Stay (HOSPITAL_COMMUNITY): Payer: Medicare Other

## 2019-08-08 LAB — CBC WITH DIFFERENTIAL/PLATELET
Abs Immature Granulocytes: 0.02 10*3/uL (ref 0.00–0.07)
Basophils Absolute: 0 10*3/uL (ref 0.0–0.1)
Basophils Relative: 1 %
Eosinophils Absolute: 0.3 10*3/uL (ref 0.0–0.5)
Eosinophils Relative: 7 %
HCT: 35.5 % — ABNORMAL LOW (ref 39.0–52.0)
Hemoglobin: 11.5 g/dL — ABNORMAL LOW (ref 13.0–17.0)
Immature Granulocytes: 1 %
Lymphocytes Relative: 24 %
Lymphs Abs: 1 10*3/uL (ref 0.7–4.0)
MCH: 31.3 pg (ref 26.0–34.0)
MCHC: 32.4 g/dL (ref 30.0–36.0)
MCV: 96.7 fL (ref 80.0–100.0)
Monocytes Absolute: 0.5 10*3/uL (ref 0.1–1.0)
Monocytes Relative: 12 %
Neutro Abs: 2.3 10*3/uL (ref 1.7–7.7)
Neutrophils Relative %: 55 %
Platelets: 156 10*3/uL (ref 150–400)
RBC: 3.67 MIL/uL — ABNORMAL LOW (ref 4.22–5.81)
RDW: 12.5 % (ref 11.5–15.5)
WBC: 4.1 10*3/uL (ref 4.0–10.5)
nRBC: 0 % (ref 0.0–0.2)

## 2019-08-08 LAB — COMPREHENSIVE METABOLIC PANEL
ALT: 17 U/L (ref 0–44)
AST: 19 U/L (ref 15–41)
Albumin: 2.5 g/dL — ABNORMAL LOW (ref 3.5–5.0)
Alkaline Phosphatase: 32 U/L — ABNORMAL LOW (ref 38–126)
Anion gap: 8 (ref 5–15)
BUN: 12 mg/dL (ref 8–23)
CO2: 24 mmol/L (ref 22–32)
Calcium: 7.6 mg/dL — ABNORMAL LOW (ref 8.9–10.3)
Chloride: 106 mmol/L (ref 98–111)
Creatinine, Ser: 0.98 mg/dL (ref 0.61–1.24)
GFR calc Af Amer: >60 mL/min (ref >60)
GFR calc non Af Amer: >60 mL/min (ref >60)
Glucose, Bld: 92 mg/dL (ref 70–99)
Potassium: 3.9 mmol/L (ref 3.5–5.1)
Sodium: 138 mmol/L (ref 135–145)
Total Bilirubin: 1.5 mg/dL — ABNORMAL HIGH (ref 0.3–1.2)
Total Protein: 5 g/dL — ABNORMAL LOW (ref 6.5–8.1)

## 2019-08-08 LAB — MAGNESIUM: Magnesium: 2 mg/dL (ref 1.7–2.4)

## 2019-08-08 LAB — PHOSPHORUS: Phosphorus: 2.4 mg/dL — ABNORMAL LOW (ref 2.5–4.6)

## 2019-08-08 MED ORDER — SODIUM CHLORIDE 0.9 % IV SOLN
3.0000 g | Freq: Four times a day (QID) | INTRAVENOUS | Status: AC
  Administered 2019-08-08 – 2019-08-09 (×3): 3 g via INTRAVENOUS
  Filled 2019-08-08: qty 8
  Filled 2019-08-08: qty 3
  Filled 2019-08-08: qty 8

## 2019-08-08 MED ORDER — POTASSIUM CHLORIDE 10 MEQ/100ML IV SOLN
10.0000 meq | Freq: Once | INTRAVENOUS | Status: AC
  Administered 2019-08-08: 10 meq via INTRAVENOUS
  Filled 2019-08-08: qty 100

## 2019-08-08 NOTE — Progress Notes (Signed)
Pharmacy Antibiotic Note  Lonnie Schaefer is a 79 y.o. male admitted on 08/04/2019 with SBO and possible intra-abdominal infection.  Pharmacy has been consulted for ampicillin-sulbactam dosing.  MD requested 5 days total of antibiotics.   Plan:  Entered end date for amp/sulb 3g Q6 hr 7/5  Will sign off consult and follow peripherally   Height: 5\' 10"  (177.8 cm) (chart review) Weight: 65.7 kg (144 lb 13.5 oz) IBW/kg (Calculated) : 73  Temp (24hrs), Avg:98.3 F (36.8 C), Min:98.2 F (36.8 C), Max:98.4 F (36.9 C)  Recent Labs  Lab 08/04/19 1730 08/05/19 0149 08/05/19 0340 08/05/19 1432 08/06/19 0151 08/07/19 0124 08/08/19 0446  WBC 18.4*  --   --  12.6* 10.9*  --  4.1  CREATININE 1.37*  --   --  1.06 1.29* 1.06 0.98  LATICACIDVEN  --  3.1* 2.3*  --   --   --   --     Estimated Creatinine Clearance: 59.6 mL/min (by C-G formula based on SCr of 0.98 mg/dL).    No Known Allergies  Antimicrobials this admission: piptazo 7/1 >> 7/3 Amp-sulb 7/3 >>  7/5   Microbiology results: 7/1 BCx: ngtd    Thank you for allowing pharmacy to be a part of this patient's care.  Benetta Spar, PharmD, BCPS, BCCP Clinical Pharmacist  Please check AMION for all [place] phone numbers After 10:00 PM, call [place] [phone]

## 2019-08-08 NOTE — Plan of Care (Signed)
  Problem: Clinical Measurements: Goal: Ability to maintain clinical measurements within normal limits will improve Outcome: Progressing   Problem: Activity: Goal: Risk for activity intolerance will decrease Outcome: Progressing   Problem: Nutrition: Goal: Adequate nutrition will be maintained Outcome: Progressing   Problem: Elimination: Goal: Will not experience complications related to bowel motility Outcome: Progressing

## 2019-08-08 NOTE — Progress Notes (Signed)
Lonnie Schaefer [id number] April 12, 1940  CARE TEAM:  PCP: [name], [place]  Outpatient Care Team: Patient Care Team: [name], [place] as PCP - General (Nurse Practitioner)  Inpatient Treatment Team: Treatment Team: Attending Provider: Elodia Florence., MD; Rounding Team: Edison Pace, Md, MD; Attending Physician: Mckinley Jewel, MD; Registered Nurse: Kennith Center, RN; Rounding Team: Clenton Pare, MD; Technician: Corliss Marcus, NT; Technician: Warden Fillers, NT; Registered Nurse: Edwin Dada, RN   Problem List:   Principal Problem:   SBO (small bowel obstruction) [facility]) Active Problems:   COPD (chronic obstructive pulmonary disease) ([facility])   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HCC)   Leukocytosis   AKI (acute kidney injury) ([place])   Lactic acidosis      * No surgery found *      Assessment  Improving  [facility] Stay = 3 days)  Plan: COPD on 2L o2  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 AKI -1.29    SBO - Hx prior exploratory laparotomy, umbilical hernia repair, laparoscopic appendectomy, and laparoscopic cholecystectomy - No indication for emergency surgery -Tolerating clears.  Durene Fruits to full liquids.  Can try soft diet later tonight in the morning if does well. - Keep K>4 and Mg >2 for bowel function - Mobilize for bowel function.  He is very motivated to get up and walk   FEN -liquids.  Advanced to fluids VTE -SCDs, okay for chemical prophylaxis from a general surgery standpoint ID -was on Zosyn and then switched to Unasyn for Crohn's return to possible SIRS and elevated lactate.  Seems to be improving.  Defer to primary medicine service but most likely will wean off soon  20 minutes spent in review, evaluation, examination, counseling, and coordination of care.  More than 50% of that time was spent in counseling.  08/08/2019    Subjective: (Chief complaint)  Feeling better overall.  Passing  gas  Walking in hallways.  Tolerated clears  Objective:  Vital signs:  Vitals:   08/07/19 1300 08/07/19 1334 08/07/19 2056 08/08/19 0545  BP: (!) 116/58  120/61 121/63  Pulse: 81  80 79  Resp: 17  18 18   Temp: 98.2 F (36.8 C)  98.3 F (36.8 C) 98.2 F (36.8 C)  TempSrc: Oral  Oral Oral  SpO2: 91%  92% 94%  Weight:      Height:  5\' 10"  (1.778 m)      Last BM Date: 08/08/19  Intake/Output   Yesterday:  07/03 0701 - 07/04 0700 In: 1403.3 [P.O.:1200; IV Piggyback:203.3] Out: 400 [Urine:400] This shift:  No intake/output data recorded.  Bowel function:  Flatus: YES  BM:  YES  Drain: NONE   Physical Exam:  General: Pt awake/alert in no acute distress Eyes: PERRL, normal EOM.  Sclera clear.  No icterus Neuro: CN II-XII intact w/o focal sensory/motor deficits. Lymph: No head/neck/groin lymphadenopathy Psych:  No delerium/psychosis/paranoia.  Oriented x 4 HENT: Normocephalic, Mucus membranes moist.  No thrush Neck: Supple, No tracheal deviation.  No obvious thyromegaly Chest: No pain to chest wall compression.  Good respiratory excursion.  No audible wheezing CV:  Pulses intact.  Regular rhythm.  No major extremity edema MS: Normal AROM mjr joints.  No obvious deformity  Abdomen: Soft.  Mildy distended.  Nontender.  No evidence of peritonitis.  No incarcerated hernias.  Ext:   No deformity.  No mjr edema.  No cyanosis Skin: No petechiae / purpurea.  No major sores.  Warm and dry    Results:  Cultures: Recent Results (from the past 720 hour(s))  SARS Coronavirus 2 by RT PCR (hospital order, performed in [facility] hospital lab) Nasopharyngeal Nasopharyngeal Swab     Status: None   Collection Time: 08/05/19  3:57 AM   Specimen: Nasopharyngeal Swab  Result Value Ref Range Status   SARS Coronavirus 2 NEGATIVE NEGATIVE Final    Comment: (NOTE) SARS-CoV-2 target nucleic acids are NOT DETECTED.  The SARS-CoV-2 RNA is generally detectable in upper and  lower respiratory specimens during the acute phase of infection. The lowest concentration of SARS-CoV-2 viral copies this assay can detect is 250 copies / mL. A negative result does not preclude SARS-CoV-2 infection and should not be used as the sole basis for treatment or other patient management decisions.  A negative result may occur with improper specimen collection / handling, submission of specimen other than nasopharyngeal swab, presence of viral mutation(s) within the areas targeted by this assay, and inadequate number of viral copies (<250 copies / mL). A negative result must be combined with clinical observations, patient history, and epidemiological information.  Fact Sheet for Patients:   StrictlyIdeas.no  Fact Sheet for Healthcare Providers: BankingDealers.co.za  This test is not yet approved or  cleared by the [place] FDA and has been authorized for detection and/or diagnosis of SARS-CoV-2 by FDA under an Emergency Use Authorization (EUA).  This EUA will remain in effect (meaning this test can be used) for the duration of the COVID-19 declaration under Section 564(b)(1) of the Act, 21 U.S.C. section 360bbb-3(b)(1), unless the authorization is terminated or revoked sooner.  Performed at [facility], [facility] [place] [address], [place], [place] [address]   Blood culture (routine x 2)     Status: None (Preliminary result)   Collection Time: 08/05/19  2:27 PM   Specimen: BLOOD  Result Value Ref Range Status   Specimen Description BLOOD LEFT ANTECUBITAL  Final   Special Requests   Final    BOTTLES DRAWN AEROBIC AND ANAEROBIC Blood Culture results may not be optimal due to an excessive volume of blood received in culture bottles   Culture   Final    NO GROWTH 2 DAYS Performed at [facility], [place] [address], [place], [place] [address]    Report Status PENDING  Incomplete  Blood culture (routine x 2)     Status: None  (Preliminary result)   Collection Time: 08/05/19  2:32 PM   Specimen: BLOOD RIGHT WRIST  Result Value Ref Range Status   Specimen Description BLOOD RIGHT WRIST  Final   Special Requests   Final    BOTTLES DRAWN AEROBIC AND ANAEROBIC Blood Culture results may not be optimal due to an inadequate volume of blood received in culture bottles   Culture   Final    NO GROWTH 2 DAYS Performed at [facility], [place] [address], [place], [place] [address]    Report Status PENDING  Incomplete    Labs: Results for orders placed or performed during the hospital encounter of 08/04/19 (from the past 48 hour(s))  Comprehensive metabolic panel     Status: Abnormal   Collection Time: 08/07/19  1:24 AM  Result Value Ref Range   Sodium 144 135 - 145 mmol/L   Potassium 4.5 3.5 - 5.1 mmol/L   Chloride 108 98 - 111 mmol/L   CO2 28 22 - 32 mmol/L   Glucose, Bld 82 70 - 99 mg/dL    Comment: Glucose reference range applies only to  samples taken after fasting for at least 8 hours.   BUN 21 8 - 23 mg/dL   Creatinine, Ser 1.06 0.61 - 1.24 mg/dL   Calcium 8.0 (L) 8.9 - 10.3 mg/dL   Total Protein 5.3 (L) 6.5 - 8.1 g/dL   Albumin 2.8 (L) 3.5 - 5.0 g/dL   AST 21 15 - 41 U/L   ALT 17 0 - 44 U/L   Alkaline Phosphatase 35 (L) 38 - 126 U/L   Total Bilirubin 1.6 (H) 0.3 - 1.2 mg/dL   GFR calc non Af Amer >60 >60 mL/min   GFR calc Af Amer >60 >60 mL/min   Anion gap 8 5 - 15    Comment: Performed at [facility] [address] [place], [place] [address]  Magnesium     Status: None   Collection Time: 08/07/19  1:24 AM  Result Value Ref Range   Magnesium 2.0 1.7 - 2.4 mg/dL    Comment: Performed at [facility] [address], [place], [place] [address]  Phosphorus     Status: None   Collection Time: 08/07/19  1:24 AM  Result Value Ref Range   Phosphorus 2.8 2.5 - 4.6 mg/dL    Comment: Performed at [facility] [address], [place], [place] [address]  CBC with Differential/Platelet      Status: Abnormal   Collection Time: 08/08/19  4:46 AM  Result Value Ref Range   WBC 4.1 4.0 - 10.5 K/uL   RBC 3.67 (L) 4.22 - 5.81 MIL/uL   Hemoglobin 11.5 (L) 13.0 - 17.0 g/dL   HCT 35.5 (L) 39 - 52 %   MCV 96.7 80.0 - 100.0 fL   MCH 31.3 26.0 - 34.0 pg   MCHC 32.4 30.0 - 36.0 g/dL   RDW 12.5 11.5 - 15.5 %   Platelets 156 150 - 400 K/uL   nRBC 0.0 0.0 - 0.2 %   Neutrophils Relative % 55 %   Neutro Abs 2.3 1.7 - 7.7 K/uL   Lymphocytes Relative 24 %   Lymphs Abs 1.0 0.7 - 4.0 K/uL   Monocytes Relative 12 %   Monocytes Absolute 0.5 0 - 1 K/uL   Eosinophils Relative 7 %   Eosinophils Absolute 0.3 0 - 0 K/uL   Basophils Relative 1 %   Basophils Absolute 0.0 0 - 0 K/uL   Immature Granulocytes 1 %   Abs Immature Granulocytes 0.02 0.00 - 0.07 K/uL    Comment: Performed at [facility], [address] [place],  [address]  Comprehensive metabolic panel     Status: Abnormal   Collection Time: 08/08/19  4:46 AM  Result Value Ref Range   Sodium 138 135 - 145 mmol/L   Potassium 3.9 3.5 - 5.1 mmol/L   Chloride 106 98 - 111 mmol/L   CO2 24 22 - 32 mmol/L   Glucose, Bld 92 70 - 99 mg/dL    Comment: Glucose reference range applies only to samples taken after fasting for at least 8 hours.   BUN 12 8 - 23 mg/dL   Creatinine, Ser 0.98 0.61 - 1.24 mg/dL   Calcium 7.6 (L) 8.9 - 10.3 mg/dL   Total Protein 5.0 (L) 6.5 - 8.1 g/dL   Albumin 2.5 (L) 3.5 - 5.0 g/dL   AST 19 15 - 41 U/L   ALT 17 0 - 44 U/L   Alkaline Phosphatase 32 (L) 38 - 126 U/L   Total Bilirubin  1.5 (H) 0.3 - 1.2 mg/dL   GFR calc non Af Amer >60 >60 mL/min   GFR calc Af Amer >60 >60 mL/min   Anion gap 8 5 - 15    Comment: Performed at [facility] [address], [place], [place] [address]  Magnesium     Status: None   Collection Time: 08/08/19  4:46 AM  Result Value Ref Range   Magnesium 2.0 1.7 - 2.4 mg/dL    Comment: Performed at [facility] [address] [place], [place] [address]    Phosphorus     Status: Abnormal   Collection Time: 08/08/19  4:46 AM  Result Value Ref Range   Phosphorus 2.4 (L) 2.5 - 4.6 mg/dL    Comment: Performed at [facility] [address] [place], [place] [address]    Imaging / Studies: DG CHEST PORT 1 VIEW  Result Date: 08/08/2019 CLINICAL DATA:  79 year old male with history of abnormal chest x-ray. EXAM: PORTABLE CHEST 1 VIEW COMPARISON:  Chest x-ray 08/05/2019. FINDINGS: Previously noted nasogastric tube has been removed. Low lung volumes with bibasilar opacities which may reflect areas of atelectasis and/or consolidation. Small bilateral pleural effusions. No evidence of pulmonary edema. Heart size is normal. Upper mediastinal contours are within normal limits. IMPRESSION: 1. Low lung volumes with bibasilar areas of atelectasis and/or consolidation. Small bilateral pleural effusions. Electronically Signed   By: Vinnie Langton M.D.   On: 08/08/2019 08:44   DG Abd Portable 1V-Small Bowel Obstruction Protocol-24 hr delay  Result Date: 08/06/2019 CLINICAL DATA:  Follow up small bowel obstruction EXAM: PORTABLE ABDOMEN - 1 VIEW COMPARISON:  08/05/2019 FINDINGS: Gastric catheter is noted coiled within the stomach. Persistent small bowel dilatation is noted although contrast material has passed through to the colon indicating a partial small bowel obstruction. IMPRESSION: Colonic contrast is now seen with persistent small bowel dilatation. These changes are consistent with a partial small bowel obstruction unchanged from the prior study. Electronically Signed   By: Inez Catalina M.D.   On: 08/06/2019 12:09    Medications / Allergies: per chart  Antibiotics: Anti-infectives (From admission, onward)   Start     Dose/Rate Route Frequency Ordered Stop   08/07/19 2200  Ampicillin-Sulbactam (UNASYN) 3 g in sodium chloride 0.9 % 100 mL IVPB     Discontinue     3 g 200 mL/hr over 30 Minutes Intravenous Every 6 hours 08/07/19 1659     08/05/19 2300   piperacillin-tazobactam (ZOSYN) IVPB 3.375 g  Status:  Discontinued       "Followed by" Linked Group Details   3.375 g 12.5 mL/hr over 240 Minutes Intravenous Every 8 hours 08/05/19 1433 08/07/19 1647   08/05/19 1445  piperacillin-tazobactam (ZOSYN) IVPB 3.375 g       "Followed by" Linked Group Details   3.375 g 100 mL/hr over 30 Minutes Intravenous  Once 08/05/19 1433 08/05/19 1656        Note: Portions of this report may have been transcribed using voice recognition software. Every effort was made to ensure accuracy; however, inadvertent computerized transcription errors may be present.   Any transcriptional errors that result from this process are unintentional.    Adin Hector, MD, FACS, MASCRS Gastrointestinal and Minimally Invasive Surgery  [facility] [address] [place], [place] [address] [phone] Fax [phone] Main/Paging  CONTACT INFORMATION: Weekday (9AM-5PM) concerns: Call [facility] main office at [phone] Weeknight (5PM-9AM) or Weekend/Holiday concerns: Check  [email or web address] for General Surgery [facility] coverage (Please, do not use SecureChat as it is not reliable communication to operating surgeons for immediate patient care)      08/08/2019  10:04 AM

## 2019-08-08 NOTE — Discharge Summary (Deleted)
PROGRESS NOTE    Lonnie Schaefer  OQH:[id number] DOB: 26-Feb-1940 DOA: 08/04/2019 PCP: [name], FNP   Chief Complaint  Patient presents with  . Abdominal Pain    Brief Narrative:  Lonnie Schaefer is [name] 79 y.o. 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  Patient tells me that his symptoms started 3 days ago.  Reports generalized abdominal pain associated with nausea, vomiting, nonbloody diarrhea and abdominal distention.  Reports pain is currently 5 out of 10, nonradiating, no aggravating or relieving factors, achy in nature.  He tells me that he had similar symptoms when he had SBO in the past.  He had last bowel movement yesterday morning.  He has been passing gas however denies fever, chills, decreased appetite, generalized weakness, lethargy.  No history of headache, blurry vision, chest pain, shortness of breath, wheezing, leg swelling, orthopnea, PND, urinary or sleep changes.  He had one SBO which was treated with exploratory laparotomy and 3 SBO's treated with medical management and resolved.  He lives with his wife at home.  No history of smoking, alcohol, licit drug use.   Assessment & Plan:   Principal Problem:   SBO (small bowel obstruction) (HCC) Active Problems:   COPD (chronic obstructive pulmonary disease) (HCC)   Leukocytosis   AKI (acute kidney injury) (HCC)   Lactic acidosis   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H[facility])  Small bowel obstruction: -Patient presented with abdominal pain N/V/D.  Has history of recurrent SBO -CT abd/pelvis with SBO involving proximal to mid jejunum -General surgery c/s -> NG out, advance to full.   Possibly soft diet tonight or in AM if he's doing well.    - mobilize, K>4 and mag >2  - APAP, oxy, morphine prn pain - Most recent abdominal plain film with colonic contrast with persistent SBO, c/w partial SBO  Systemic Inflammatory Response Syndrome  Fever  Leukocytosis: fever, leukocytosis, elevated lactate in setting of SBO.  No clear cause for fever on CT or CXR.   CXR with possible hazy opacity at R lung base  Follow blood cx NGTDx2 UA not c/w UTI Zosyn -> narrow to unasyn - will d/c abx 7/5 for 5 days abx  Repeat CXR 7/4 with atelectasis vs consolidation, small bilateral effusions.   Chronic hypoxemic respiratory failure secondary to underlying COPD: Stable -Continue on 2 L of oxygen via nasal cannula. -No wheezing noted on exam.  Chest x-ray as above.  AKI: Likely secondary to dehydration -Continue IV fluids.  Monitor kidney function closely.  Avoid nephrotoxic medication  Dehydration: -Patient urine is positive for ketones, lactic acid elevated -Continue IV fluids.  Elevated Bilirubin: improving gradually, follow.  CT abdomen/pelvis without focal liver abnormality, s/p cholecystectomy.  DVT prophylaxis: lovenox Code Status: full  Family Communication: none at bedside Disposition:   Status is: Inpatient  Remains inpatient appropriate because:Inpatient level of care appropriate due to severity of illness   Dispo: The patient is from: Home              Anticipated d/c is to: pending              Anticipated d/c date is: > 3 days              Patient  currently is not medically stable to d/c.   Consultants:   surgery  Procedures: none  Antimicrobials:  Anti-infectives (From admission, onward)   Start     Dose/Rate Route Frequency Ordered Stop   08/07/19 2200  Ampicillin-Sulbactam (UNASYN) 3 g in sodium chloride 0.9 % 100 mL IVPB     Discontinue     3 g 200 mL/hr over 30 Minutes Intravenous Every 6 hours 08/07/19 1659     08/05/19 2300   piperacillin-tazobactam (ZOSYN) IVPB 3.375 g  Status:  Discontinued       "Followed by" Linked Group Details   3.375 g 12.5 mL/hr over 240 Minutes Intravenous Every 8 hours 08/05/19 1433 08/07/19 1647   08/05/19 1445  piperacillin-tazobactam (ZOSYN) IVPB 3.375 g       "Followed by" Linked Group Details   3.375 g 100 mL/hr over 30 Minutes Intravenous  Once 08/05/19 1433 08/05/19 1656     Subjective: No new complaints Feels gradually better  Objective: Vitals:   08/07/19 1334 08/07/19 2056 08/08/19 0545 08/08/19 1545  BP:  120/61 121/63 117/70  Pulse:  80 79 67  Resp:  18 18 16   Temp:  98.3 F (36.8 C) 98.2 F (36.8 C) 98.4 F (36.9 C)  TempSrc:  Oral Oral Oral  SpO2:  92% 94% 97%  Weight:      Height: 5\' 10"  (1.778 m)       Intake/Output Summary (Last 24 hours) at 08/08/2019 1637 Last data filed at 08/08/2019 1549 Gross per 24 hour  Intake 1468.33 ml  Output --  Net 1468.33 ml   Filed Weights   08/06/19 0500 08/07/19 0454  Weight: 65.7 kg 65.7 kg    Examination:  General: No acute distress. Cardiovascular: Heart sounds show Taniah Reinecke regular rate, and rhythm. Lungs: Clear to auscultation bilaterally  Abdomen: Soft, nontender, nondistended Neurological: Alert and oriented 3. Moves all extremities 4. Cranial nerves II through XII grossly intact. Skin: Warm and dry. No rashes or lesions. Extremities: No clubbing or cyanosis. No edema.   Data Reviewed: I have personally reviewed following labs and imaging studies  CBC: Recent Labs  Lab 08/04/19 1730 08/05/19 1432 08/06/19 0151 08/08/19 0446  WBC 18.4* 12.6* 10.9* 4.1  NEUTROABS  --  10.4*  --  2.3  HGB 15.6 14.7 13.1 11.5*  HCT 48.0 45.3 41.3 35.5*  MCV 95.6 96.2 98.3 96.7  PLT 312 259 244 466    Basic Metabolic Panel: Recent Labs  Lab 08/04/19 1730 08/05/19 1432 08/06/19 0151 08/07/19 0124 08/08/19 0446  NA 142  --  144 144 138  K 4.0  --  4.2 4.5 3.9  CL 101  --  107 108 106  CO2 27  --  27 28 24    GLUCOSE 193*  --  102* 82 92  BUN 21  --  26* 21 12  CREATININE 1.37* 1.06 1.29* 1.06 0.98  CALCIUM 10.2  --  8.7* 8.0* 7.6*  MG  --  2.2  --  2.0 2.0  PHOS  --  3.6  --  2.8 2.4*    GFR: Estimated Creatinine Clearance: 59.6 mL/min (by C-G formula based on SCr of 0.98 mg/dL).  Liver Function Tests: Recent Labs  Lab 08/04/19 1730 08/06/19 0151 08/07/19 0124 08/08/19 0446  AST 22 17 21 19   ALT 17 15 17 17   ALKPHOS 54 41 35* 32*  BILITOT 2.7* 1.8* 1.6* 1.5*  PROT 7.6 6.0* 5.3* 5.0*  ALBUMIN 3.9 3.3* 2.8* 2.5*  CBG: No results for input(s): GLUCAP in the last 168 hours.   Recent Results (from the past 240 hour(s))  SARS Coronavirus 2 by RT PCR (hospital order, performed in [facility] hospital lab) Nasopharyngeal Nasopharyngeal Swab     Status: None   Collection Time: 08/05/19  3:57 AM   Specimen: Nasopharyngeal Swab  Result Value Ref Range Status   SARS Coronavirus 2 NEGATIVE NEGATIVE Final    Comment: (NOTE) SARS-CoV-2 target nucleic acids are NOT DETECTED.  The SARS-CoV-2 RNA is generally detectable in upper and lower respiratory specimens during the acute phase of infection. The lowest concentration of SARS-CoV-2 viral copies this assay can detect is 250 copies / mL. Prosper Paff negative result does not preclude SARS-CoV-2 infection and should not be used as the sole basis for treatment or other patient management decisions.  Elodie Panameno negative result may occur with improper specimen collection / handling, submission of specimen other than nasopharyngeal swab, presence of viral mutation(s) within the areas targeted by this assay, and inadequate number of viral copies (<250 copies / mL). Garrus Gauthreaux negative result must be combined with clinical observations, patient history, and epidemiological information.  Fact Sheet for Patients:   StrictlyIdeas.no  Fact Sheet for Healthcare Providers: BankingDealers.co.za  This test is not yet  approved or  cleared by the [place] FDA and has been authorized for detection and/or diagnosis of SARS-CoV-2 by FDA under an Emergency Use Authorization (EUA).  This EUA will remain in effect (meaning this test can be used) for the duration of the COVID-19 declaration under Section 564(b)(1) of the Act, 21 U.S.C. section 360bbb-3(b)(1), unless the authorization is terminated or revoked sooner.  Performed at [facility], [place] [address] [place], [place] [address]   Blood culture (routine x 2)     Status: None (Preliminary result)   Collection Time: 08/05/19  2:27 PM   Specimen: BLOOD  Result Value Ref Range Status   Specimen Description BLOOD LEFT ANTECUBITAL  Final   Special Requests   Final    BOTTLES DRAWN AEROBIC AND ANAEROBIC Blood Culture results may not be optimal due to an excessive volume of blood received in culture bottles   Culture   Final    NO GROWTH 3 DAYS Performed at [facility], [place] [address] [place], [place] [address]    Report Status PENDING  Incomplete  Blood culture (routine x 2)     Status: None (Preliminary result)   Collection Time: 08/05/19  2:32 PM   Specimen: BLOOD RIGHT WRIST  Result Value Ref Range Status   Specimen Description BLOOD RIGHT WRIST  Final   Special Requests   Final    BOTTLES DRAWN AEROBIC AND ANAEROBIC Blood Culture results may not be optimal due to an inadequate volume of blood received in culture bottles   Culture   Final    NO GROWTH 3 DAYS Performed at [facility], [place] [address], [place], [place] [address]    Report Status PENDING  Incomplete         Radiology Studies: DG CHEST PORT 1 VIEW  Result Date: 08/08/2019 CLINICAL DATA:  79 year old male with history of abnormal chest x-ray. EXAM: PORTABLE CHEST 1 VIEW COMPARISON:  Chest x-ray 08/05/2019. FINDINGS: Previously noted nasogastric tube has been removed. Low lung volumes with bibasilar opacities which may reflect areas of atelectasis and/or  consolidation. Small bilateral pleural effusions. No evidence of pulmonary edema. Heart size is normal. Upper mediastinal contours are within normal limits. IMPRESSION:  1. Low lung volumes with bibasilar areas of atelectasis and/or consolidation. Small bilateral pleural effusions. Electronically Signed   By: Vinnie Langton M.D.   On: 08/08/2019 08:44        Scheduled Meds: . sodium chloride flush  3 mL Intravenous Once  . umeclidinium bromide  1 puff Inhalation Daily   Continuous Infusions: . ampicillin-sulbactam (UNASYN) IV 3 g (08/08/19 1542)     LOS: 3 days    Time spent: ovre 30 min    Fayrene Helper, MD [facility]   To contact the attending provider between 7A-7P or the covering provider during after hours 7P-7A, please log into the web site [email or web address] and access using universal [place] password for that web site. If you do not have the password, please call the hospital operator.  08/08/2019, 4:37 PM

## 2019-08-09 DIAGNOSIS — Z9981 Dependence on supplemental oxygen: Secondary | ICD-10-CM

## 2019-08-09 LAB — CBC WITH DIFFERENTIAL/PLATELET
Abs Immature Granulocytes: 0.03 10*3/uL (ref 0.00–0.07)
Basophils Absolute: 0 10*3/uL (ref 0.0–0.1)
Basophils Relative: 0 %
Eosinophils Absolute: 0.2 10*3/uL (ref 0.0–0.5)
Eosinophils Relative: 5 %
HCT: 37.1 % — ABNORMAL LOW (ref 39.0–52.0)
Hemoglobin: 12.2 g/dL — ABNORMAL LOW (ref 13.0–17.0)
Immature Granulocytes: 1 %
Lymphocytes Relative: 23 %
Lymphs Abs: 1.1 10*3/uL (ref 0.7–4.0)
MCH: 31 pg (ref 26.0–34.0)
MCHC: 32.9 g/dL (ref 30.0–36.0)
MCV: 94.2 fL (ref 80.0–100.0)
Monocytes Absolute: 0.5 10*3/uL (ref 0.1–1.0)
Monocytes Relative: 12 %
Neutro Abs: 2.8 10*3/uL (ref 1.7–7.7)
Neutrophils Relative %: 59 %
Platelets: 195 10*3/uL (ref 150–400)
RBC: 3.94 MIL/uL — ABNORMAL LOW (ref 4.22–5.81)
RDW: 12.4 % (ref 11.5–15.5)
WBC: 4.6 10*3/uL (ref 4.0–10.5)
nRBC: 0 % (ref 0.0–0.2)

## 2019-08-09 LAB — COMPREHENSIVE METABOLIC PANEL
ALT: 18 U/L (ref 0–44)
AST: 18 U/L (ref 15–41)
Albumin: 2.6 g/dL — ABNORMAL LOW (ref 3.5–5.0)
Alkaline Phosphatase: 36 U/L — ABNORMAL LOW (ref 38–126)
Anion gap: 7 (ref 5–15)
BUN: 8 mg/dL (ref 8–23)
CO2: 28 mmol/L (ref 22–32)
Calcium: 8.6 mg/dL — ABNORMAL LOW (ref 8.9–10.3)
Chloride: 105 mmol/L (ref 98–111)
Creatinine, Ser: 0.82 mg/dL (ref 0.61–1.24)
GFR calc Af Amer: >60 mL/min (ref >60)
GFR calc non Af Amer: >60 mL/min (ref >60)
Glucose, Bld: 100 mg/dL — ABNORMAL HIGH (ref 70–99)
Potassium: 4.2 mmol/L (ref 3.5–5.1)
Sodium: 140 mmol/L (ref 135–145)
Total Bilirubin: 1.2 mg/dL (ref 0.3–1.2)
Total Protein: 5.2 g/dL — ABNORMAL LOW (ref 6.5–8.1)

## 2019-08-09 LAB — PHOSPHORUS: Phosphorus: 2.7 mg/dL (ref 2.5–4.6)

## 2019-08-09 LAB — MAGNESIUM: Magnesium: 1.8 mg/dL (ref 1.7–2.4)

## 2019-08-09 MED ORDER — BOOST PO LIQD
237.0000 mL | Freq: Every day | ORAL | Status: DC
  Administered 2019-08-09: 237 mL via ORAL
  Filled 2019-08-09: qty 237

## 2019-08-09 MED ORDER — POLYETHYLENE GLYCOL 3350 17 G PO PACK: 17.0000 g | PACK | Freq: Two times a day (BID) | ORAL | Status: DC | PRN

## 2019-08-09 MED ORDER — POLYETHYLENE GLYCOL 3350 17 G PO PACK
17.0000 g | PACK | Freq: Two times a day (BID) | ORAL | Status: DC
  Administered 2019-08-09: 17 g via ORAL
  Filled 2019-08-09: qty 1

## 2019-08-09 MED ORDER — POLYETHYLENE GLYCOL 3350 17 G PO PACK: 17.0000 g | PACK | Freq: Two times a day (BID) | ORAL | 0 refills | Status: DC

## 2019-08-09 NOTE — Discharge Planning (Signed)
Patient discharged home in stable condition. Verbalizes understanding of all discharge instructions, including home medications and follow up appointments. 

## 2019-08-09 NOTE — Evaluation (Signed)
Physical Therapy Evaluation Patient Details Name: Lonnie Schaefer MRN: [id number] DOB: Feb 11, 1940 Today's Date: 08/09/2019   History of Present Illness  Pt is a 79 y.o. male with medical history significant of chronic hypoxemic respiratory failure secondary to underlying COPD-on 2 L of oxygen via nasal cannulae at home, recurrent SBO, history of prior exploratory laparotomy, umbilical hernia repair, laparoscopic appendectomy and laparoscopic cholecystectomy presents to emergency department due to abdominal pain, nausea vomiting and diarrhea  Pt admitted with SBO that has been managed via NG tube and diet, no surgery.  Clinical Impression  Pt admitted with SBO that has been managed without surgery.  He has been ambulating independently in the halls.  Pt was able to demonstrate safe ambulation without AD during therapy.  He did required 1 LPM O2 in order to maintain sats >90% (see below for details).  Pt does have home O2 at baseline that he wears at night.  Pt appears to be at or near baseline - no further PT indicated.     Follow Up Recommendations No PT follow up    Equipment Recommendations  None recommended by PT    Recommendations for Other Services       Precautions / Restrictions Precautions Precautions: None      Mobility  Bed Mobility Overal bed mobility: Independent                Transfers Overall transfer level: Independent                  Ambulation/Gait Ambulation/Gait assistance: Supervision Gait Distance (Feet): 400 Feet Assistive device: IV Pole;None Gait Pattern/deviations: WFL(Within Functional Limits)     General Gait Details: Pt was able to push IV pole and ambulate without difficulty.  Also , able to ambulate without holding IV pole.  Steady , normal gait pattern.  Pt has been ambulating independently but had supervision during PT eval  Stairs            Wheelchair Mobility    Modified Rankin (Stroke Patients Only)       Balance  Overall balance assessment: Needs assistance   Sitting balance-Leahy Scale: Normal     Standing balance support: No upper extremity supported Standing balance-Leahy Scale: Good                               Pertinent Vitals/Pain Pain Assessment: No/denies pain    Home Living Family/patient expects to be discharged to:: Private residence Living Arrangements: Spouse/significant other Available Help at Discharge: Available 24 hours/day;Family Type of Home: House Home Access: Stairs to enter Entrance Stairs-Rails: Left Entrance Stairs-Number of Steps: 3 Home Layout: One level Home Equipment: Cane - single point;Wheelchair - Comptroller - 2 wheels;Shower seat (AD belong to wife but she is not using at this time)      Prior Function Level of Independence: Independent         Comments: Wears 2 LPM O2 at night at home; no falls     Hand Dominance        Extremity/Trunk Assessment   Upper Extremity Assessment Upper Extremity Assessment: Overall WFL for tasks assessed    Lower Extremity Assessment Lower Extremity Assessment: Overall WFL for tasks assessed    Cervical / Trunk Assessment Cervical / Trunk Assessment: Normal  Communication   Communication: No difficulties  Cognition Arousal/Alertness: Awake/alert Behavior During Therapy: WFL for tasks assessed/performed Overall Cognitive Status: Within Functional Limits for  tasks assessed                                        General Comments General comments (skin integrity, edema, etc.): Pt was on 2 LPM O2 at home but reports he only wears at night.  Sats were 94% on 2 LPM O2 at rest.  Removed O2 and sats dropped to 86% on RA at rest but with deep breaths pt able to recover to 91%.  Ambulated 100' on RA but sats down to 87% with deep breaths.  Placed on 1 LPM to complete walk and sats maintained 94%.  Placed back on 2 LPM O2 in room    Exercises     Assessment/Plan     PT Assessment Patent does not need any further PT services  PT Problem List         PT Treatment Interventions      PT Goals (Current goals can be found in the Care Plan section)  Acute Rehab PT Goals Patient Stated Goal: return home PT Goal Formulation: All assessment and education complete, DC therapy Potential to Achieve Goals: Good    Frequency     Barriers to discharge        Co-evaluation               AM-PAC PT "6 Clicks" Mobility  Outcome Measure Help needed turning from your back to your side while in a flat bed without using bedrails?: None Help needed moving from lying on your back to sitting on the side of a flat bed without using bedrails?: None Help needed moving to and from a bed to a chair (including a wheelchair)?: None Help needed standing up from a chair using your arms (e.g., wheelchair or bedside chair)?: None Help needed to walk in hospital room?: None Help needed climbing 3-5 steps with a railing? : None 6 Click Score: 24    End of Session Equipment Utilized During Treatment: Oxygen Activity Tolerance: Patient tolerated treatment well Patient left: in bed;with call bell/phone within reach Nurse Communication: Mobility status      Time: 1215-1237 PT Time Calculation (min) (ACUTE ONLY): 22 min   Charges:   PT Evaluation $PT Eval Low Complexity: 1 Low          Canio Winokur, PT Acute Rehab Services Pager [phone] [facility] [phone]    Karlton Lemon 08/09/2019, 1:10 PM

## 2019-08-09 NOTE — Progress Notes (Signed)
PROGRESS NOTE    Lonnie Schaefer  ZOX:[id number] DOB: 09/05/1940 DOA: 08/04/2019 PCP: [name], FNP   Chief Complaint  Patient presents with  . Abdominal Pain    Brief Narrative:  Lonnie Schaefer is Lonnie Schaefer 79 y.o. 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  Patient tells me that his symptoms started 3 days ago.  Reports generalized abdominal pain associated with nausea, vomiting, nonbloody diarrhea and abdominal distention.  Reports pain is currently 5 out of 10, nonradiating, no aggravating or relieving factors, achy in nature.  He tells me that he had similar symptoms when he had SBO in the past.  He had last bowel movement yesterday morning.  He has been passing gas however denies fever, chills, decreased appetite, generalized weakness, lethargy.  No history of headache, blurry vision, chest pain, shortness of breath, wheezing, leg swelling, orthopnea, PND, urinary or sleep changes.  He had one SBO which was treated with exploratory laparotomy and 3 SBO's treated with medical management and resolved.  He lives with his wife at home.  No history of smoking, alcohol, licit drug use.   Assessment & Plan:   Principal Problem:   SBO (small bowel obstruction) (HCC) Active Problems:   COPD (chronic obstructive pulmonary disease) (HCC)   Leukocytosis   AKI (acute kidney injury) (HCC)   Lactic acidosis   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HCC)   Supplemental oxygen dependent  Small bowel obstruction: -Patient presented with abdominal pain N/V/D.  Has history of recurrent SBO -CT abd/pelvis with SBO involving proximal to mid jejunum -General surgery  c/s -> NG out, advance to full.  Possibly soft diet tonight or in AM if he's doing well.    - mobilize, K>4 and mag >2  - APAP, oxy, morphine prn pain - Most recent abdominal plain film with colonic contrast with persistent SBO, c/w partial SBO  Systemic Inflammatory Response Syndrome  Fever  Leukocytosis: fever, leukocytosis, elevated lactate in setting of SBO.  No clear cause for fever on CT or CXR.   CXR with possible hazy opacity at R lung base  Follow blood cx NGTDx2 UA not c/w UTI Zosyn -> narrow to unasyn - will d/c abx 7/5 for 5 days abx  Repeat CXR 7/4 with atelectasis vs consolidation, small bilateral effusions.   Chronic hypoxemic respiratory failure secondary to underlying COPD: Stable -Continue on 2 L of oxygen via nasal cannula. -No wheezing noted on exam.  Chest x-ray as above.  AKI: Likely secondary to dehydration -Continue IV fluids.  Monitor kidney function closely.  Avoid nephrotoxic medication  Dehydration: -Patient urine is positive for ketones, lactic acid elevated -Continue IV fluids.  Elevated Bilirubin: improving gradually, follow.  CT abdomen/pelvis without focal liver abnormality, s/p cholecystectomy.  DVT prophylaxis: lovenox Code Status: full  Family Communication: none at bedside Disposition:   Status is: Inpatient  Remains inpatient appropriate because:Inpatient level of care appropriate due to severity of illness   Dispo: The patient is from: Home              Anticipated d/c is to: pending              Anticipated d/c date is: > 3 days  Patient currently is not medically stable to d/c.   Consultants:   surgery  Procedures: none  Antimicrobials:  Anti-infectives (From admission, onward)   Start     Dose/Rate Route Frequency Ordered Stop   08/08/19 2100  Ampicillin-Sulbactam (UNASYN) 3 g in sodium chloride 0.9 % 100 mL IVPB        3 g 200 mL/hr over 30 Minutes Intravenous Every 6 hours 08/08/19 1823 08/09/19 0907    08/07/19 2200  Ampicillin-Sulbactam (UNASYN) 3 g in sodium chloride 0.9 % 100 mL IVPB  Status:  Discontinued        3 g 200 mL/hr over 30 Minutes Intravenous Every 6 hours 08/07/19 1659 08/08/19 1639   08/05/19 2300  piperacillin-tazobactam (ZOSYN) IVPB 3.375 g  Status:  Discontinued       "Followed by" Linked Group Details   3.375 g 12.5 mL/hr over 240 Minutes Intravenous Every 8 hours 08/05/19 1433 08/07/19 1647   08/05/19 1445  piperacillin-tazobactam (ZOSYN) IVPB 3.375 g       "Followed by" Linked Group Details   3.375 g 100 mL/hr over 30 Minutes Intravenous  Once 08/05/19 1433 08/05/19 1656     Subjective: No new complaints Feels gradually better  Objective: Vitals:   08/08/19 1545 08/08/19 2040 08/09/19 0531 08/09/19 1413  BP: 117/70 129/69 (!) 141/73 117/74  Pulse: 67 69 66 72  Resp: 16 17 17 15   Temp: 98.4 F (36.9 C) 99.3 F (37.4 C) 98.5 F (36.9 C) 98.6 F (37 C)  TempSrc: Oral Oral Oral   SpO2: 97% 96% 95% 94%  Weight:      Height:        Intake/Output Summary (Last 24 hours) at 08/09/2019 1824 Last data filed at 08/09/2019 1300 Gross per 24 hour  Intake 517 ml  Output --  Net 517 ml   Filed Weights   08/06/19 0500 08/07/19 0454  Weight: 65.7 kg 65.7 kg    Examination:  General: No acute distress. Cardiovascular: Heart sounds show Ellene Bloodsaw regular rate, and rhythm. Lungs: Clear to auscultation bilaterally  Abdomen: Soft, nontender, nondistended Neurological: Alert and oriented 3. Moves all extremities 4. Cranial nerves II through XII grossly intact. Skin: Warm and dry. No rashes or lesions. Extremities: No clubbing or cyanosis. No edema.   Data Reviewed: I have personally reviewed following labs and imaging studies  CBC: Recent Labs  Lab 08/04/19 1730 08/05/19 1432 08/06/19 0151 08/08/19 0446 08/09/19 0220  WBC 18.4* 12.6* 10.9* 4.1 4.6  NEUTROABS  --  10.4*  --  2.3 2.8  HGB 15.6 14.7 13.1 11.5* 12.2*  HCT 48.0 45.3 41.3 35.5* 37.1*  MCV 95.6  96.2 98.3 96.7 94.2  PLT 312 259 244 156 623    Basic Metabolic Panel: Recent Labs  Lab 08/04/19 1730 08/04/19 1730 08/05/19 1432 08/06/19 0151 08/07/19 0124 08/08/19 0446 08/09/19 0220  NA 142  --   --  144 144 138 140  K 4.0  --   --  4.2 4.5 3.9 4.2  CL 101  --   --  107 108 106 105  CO2 27  --   --  27 28 24 28   GLUCOSE 193*  --   --  102* 82 92 100*  BUN 21  --   --  26* 21 12 8   CREATININE 1.37*   < > 1.06 1.29* 1.06 0.98 0.82  CALCIUM 10.2  --   --  8.7* 8.0* 7.6* 8.6*  MG  --   --  2.2  --  2.0 2.0 1.8  PHOS  --   --  3.6  --  2.8 2.4* 2.7   < > = values in this interval not displayed.    GFR: Estimated Creatinine Clearance: 71.2 mL/min (by C-G formula based on SCr of 0.82 mg/dL).  Liver Function Tests: Recent Labs  Lab 08/04/19 1730 08/06/19 0151 08/07/19 0124 08/08/19 0446 08/09/19 0220  AST 22 17 21 19 18   ALT 17 15 17 17 18   ALKPHOS 54 41 35* 32* 36*  BILITOT 2.7* 1.8* 1.6* 1.5* 1.2  PROT 7.6 6.0* 5.3* 5.0* 5.2*  ALBUMIN 3.9 3.3* 2.8* 2.5* 2.6*    CBG: No results for input(s): GLUCAP in the last 168 hours.   Recent Results (from the past 240 hour(s))  SARS Coronavirus 2 by RT PCR (hospital order, performed in [facility] hospital lab) Nasopharyngeal Nasopharyngeal Swab     Status: None   Collection Time: 08/05/19  3:57 AM   Specimen: Nasopharyngeal Swab  Result Value Ref Range Status   SARS Coronavirus 2 NEGATIVE NEGATIVE Final    Comment: (NOTE) SARS-CoV-2 target nucleic acids are NOT DETECTED.  The SARS-CoV-2 RNA is generally detectable in upper and lower respiratory specimens during the acute phase of infection. The lowest concentration of SARS-CoV-2 viral copies this assay can detect is 250 copies / mL. Maurisio Ruddy negative result does not preclude SARS-CoV-2 infection and should not be used as the sole basis for treatment or other patient management decisions.  Wally Shevchenko negative result may occur with improper specimen collection / handling, submission  of specimen other than nasopharyngeal swab, presence of viral mutation(s) within the areas targeted by this assay, and inadequate number of viral copies (<250 copies / mL). Meshilem Machuca negative result must be combined with clinical observations, patient history, and epidemiological information.  Fact Sheet for Patients:   StrictlyIdeas.no  Fact Sheet for Healthcare Providers: BankingDealers.co.za  This test is not yet approved or  cleared by the [place] FDA and has been authorized for detection and/or diagnosis of SARS-CoV-2 by FDA under an Emergency Use Authorization (EUA).  This EUA will remain in effect (meaning this test can be used) for the duration of the COVID-19 declaration under Section 564(b)(1) of the Act, 21 U.S.C. section 360bbb-3(b)(1), unless the authorization is terminated or revoked sooner.  Performed at [facility], [place] [address] [place], [place] [address]   Blood culture (routine x 2)     Status: None (Preliminary result)   Collection Time: 08/05/19  2:27 PM   Specimen: BLOOD  Result Value Ref Range Status   Specimen Description BLOOD LEFT ANTECUBITAL  Final   Special Requests   Final    BOTTLES DRAWN AEROBIC AND ANAEROBIC Blood Culture results may not be optimal due to an excessive volume of blood received in culture bottles   Culture   Final    NO GROWTH 4 DAYS Performed at [facility], [place] [address], [place], [place] [address]    Report Status PENDING  Incomplete  Blood culture (routine x 2)     Status: None (Preliminary result)   Collection Time: 08/05/19  2:32 PM   Specimen: BLOOD RIGHT WRIST  Result Value Ref Range Status   Specimen Description BLOOD RIGHT WRIST  Final   Special Requests   Final    BOTTLES DRAWN AEROBIC AND ANAEROBIC Blood Culture results may not be optimal due to an inadequate volume of blood received in culture bottles   Culture  Final    NO GROWTH 4 DAYS Performed at  [facility], [place] [address] [place], [place] [address]    Report Status PENDING  Incomplete         Radiology Studies: DG CHEST PORT 1 VIEW  Result Date: 08/08/2019 CLINICAL DATA:  79 year old male with history of abnormal chest x-ray. EXAM: PORTABLE CHEST 1 VIEW COMPARISON:  Chest x-ray 08/05/2019. FINDINGS: Previously noted nasogastric tube has been removed. Low lung volumes with bibasilar opacities which may reflect areas of atelectasis and/or consolidation. Small bilateral pleural effusions. No evidence of pulmonary edema. Heart size is normal. Upper mediastinal contours are within normal limits. IMPRESSION: 1. Low lung volumes with bibasilar areas of atelectasis and/or consolidation. Small bilateral pleural effusions. Electronically Signed   By: Vinnie Langton M.D.   On: 08/08/2019 08:44        Scheduled Meds: . lactose free nutrition  237 mL Oral Daily  . polyethylene glycol  17 g Oral BID  . sodium chloride flush  3 mL Intravenous Once  . umeclidinium bromide  1 puff Inhalation Daily   Continuous Infusions:    LOS: 4 days    Time spent: ovre 30 min    Fayrene Helper, MD [facility]   To contact the attending provider between 7A-7P or the covering provider during after hours 7P-7A, please log into the web site [email or web address] and access using universal [place] password for that web site. If you do not have the password, please call the hospital operator.  08/09/2019, 6:24 PM

## 2019-08-09 NOTE — Progress Notes (Signed)
Lonnie Schaefer [id number] Jul 14, 1940  CARE TEAM:  PCP: [name], [place]  Outpatient Care Team: Patient Care Team: [name], FNP as PCP - General (Nurse Practitioner)  Inpatient Treatment Team: Treatment Team: Attending Provider: Elodia Florence., MD; Rounding Team: Edison Pace, Md, MD; Attending Physician: Mckinley Jewel, MD; Registered Nurse: Kennith Center, RN; Rounding Team: Clenton Pare, MD; Utilization Review: Sindy Guadeloupe, RN; Technician: Joella Prince, NT; Case Manager: Robyne Askew, RN; Social Worker: Alexander Mt, LCSW   Problem List:   Principal Problem:   SBO (small bowel obstruction) [facility]) Active Problems:   COPD (chronic obstructive pulmonary disease) ([place])   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[place])   Leukocytosis   AKI (acute kidney injury) ([place])   Lactic acidosis      * No surgery found *      Assessment  SBO resolving  [facility] Stay = 4 days)  Plan: COPD on 2L O2 - stable 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 AKI -1.29    SBO - Hx prior exploratory laparotomy, umbilical hernia repair, laparoscopic appendectomy, and laparoscopic cholecystectomy -Obstruction resolving nonoperatively. -Solid diet. -Encouraged MiraLAX bowel regimen as opposed to mag citrate and stool softeners.  See if that controls things better - Keep K>4 and Mg >2 for bowel function - Mobilize for bowel function.  He is very motivated to get up and walk  Okay to discharge from surgery standpoint.  Surgery will follow more peripherally.  Follow-up in office only PRN  FEN -solid diet VTE -SCDs, okay for chemical prophylaxis from a general surgery standpoint ID -was on Zosyn and then switched to Unasyn for Crohn's return to possible SIRS and elevated lactate.  Seems to be improving.  Defer to primary medicine service no need for antibiotics from surgery standpoint  20 minutes spent in review, evaluation,  examination, counseling, and coordination of care.  More than 50% of that time was spent in counseling.  08/09/2019    Subjective: (Chief complaint)  Patient feeling much better overall.  Had many bowel movements.  Walking hallways.  Feels much better and hoping to go home  Objective:  Vital signs:  Vitals:   08/08/19 0545 08/08/19 1545 08/08/19 2040 08/09/19 0531  BP: 121/63 117/70 129/69 (!) 141/73  Pulse: 79 67 69 66  Resp: 18 16 17 17   Temp: 98.2 F (36.8 C) 98.4 F (36.9 C) 99.3 F (37.4 C) 98.5 F (36.9 C)  TempSrc: Oral Oral Oral Oral  SpO2: 94% 97% 96% 95%  Weight:      Height:        Last BM Date: 08/09/19  Intake/Output   Yesterday:  07/04 0701 - 07/05 0700 In: 9449 [P.O.:930; IV Piggyback:216] Out: -  This shift:  No intake/output data recorded.  Bowel function:  Flatus: YES  BM:  YES  Drain: NONE   Physical Exam:  General: Pt awake/alert in no acute distress.  Smiling.  Joking. Eyes: PERRL, normal EOM.  Sclera clear.  No icterus Neuro: CN II-XII intact w/o focal sensory/motor deficits. Lymph: No head/neck/groin lymphadenopathy Psych:  No delerium/psychosis/paranoia.  Oriented x 4 HENT: Normocephalic, Mucus membranes moist.  No thrush.  Nasal cannula in place Neck: Supple, No tracheal deviation.  No obvious thyromegaly Chest: No pain to chest wall compression.  Good respiratory excursion.  No audible wheezing CV:  Pulses intact.  Regular rhythm.  No major extremity edema MS: Normal AROM mjr joints.  No obvious deformity  Abdomen: Soft.  Nondistended.  Nontender.  No evidence of peritonitis.  No incarcerated hernias.  Ext:   No deformity.  No mjr edema.  No cyanosis Skin: No petechiae / purpurea.  No major sores.  Warm and dry    Results:   Cultures: Recent Results (from the past 720 hour(s))  SARS Coronavirus 2 by RT PCR (hospital order, performed in [facility] hospital lab) Nasopharyngeal Nasopharyngeal Swab     Status: None    Collection Time: 08/05/19  3:57 AM   Specimen: Nasopharyngeal Swab  Result Value Ref Range Status   SARS Coronavirus 2 NEGATIVE NEGATIVE Final    Comment: (NOTE) SARS-CoV-2 target nucleic acids are NOT DETECTED.  The SARS-CoV-2 RNA is generally detectable in upper and lower respiratory specimens during the acute phase of infection. The lowest concentration of SARS-CoV-2 viral copies this assay can detect is 250 copies / mL. A negative result does not preclude SARS-CoV-2 infection and should not be used as the sole basis for treatment or other patient management decisions.  A negative result may occur with improper specimen collection / handling, submission of specimen other than nasopharyngeal swab, presence of viral mutation(s) within the areas targeted by this assay, and inadequate number of viral copies (<250 copies / mL). A negative result must be combined with clinical observations, patient history, and epidemiological information.  Fact Sheet for Patients:   StrictlyIdeas.no  Fact Sheet for Healthcare Providers: BankingDealers.co.za  This test is not yet approved or  cleared by the [place] FDA and has been authorized for detection and/or diagnosis of SARS-CoV-2 by FDA under an Emergency Use Authorization (EUA).  This EUA will remain in effect (meaning this test can be used) for the duration of the COVID-19 declaration under Section 564(b)(1) of the Act, 21 U.S.C. section 360bbb-3(b)(1), unless the authorization is terminated or revoked sooner.  Performed at [facility], [place] [address] [place], [place] [address]   Blood culture (routine x 2)     Status: None (Preliminary result)   Collection Time: 08/05/19  2:27 PM   Specimen: BLOOD  Result Value Ref Range Status   Specimen Description BLOOD LEFT ANTECUBITAL  Final   Special Requests   Final    BOTTLES DRAWN AEROBIC AND ANAEROBIC Blood Culture results may not  be optimal due to an excessive volume of blood received in culture bottles   Culture   Final    NO GROWTH 3 DAYS Performed at [facility], [place] [address], [place], [place] [address]    Report Status PENDING  Incomplete  Blood culture (routine x 2)     Status: None (Preliminary result)   Collection Time: 08/05/19  2:32 PM   Specimen: BLOOD RIGHT WRIST  Result Value Ref Range Status   Specimen Description BLOOD RIGHT WRIST  Final   Special Requests   Final    BOTTLES DRAWN AEROBIC AND ANAEROBIC Blood Culture results may not be optimal due to an inadequate volume of blood received in culture bottles   Culture   Final    NO GROWTH 3 DAYS Performed at [facility], [place] [address] [place], [place] [address]    Report Status PENDING  Incomplete    Labs: Results for orders placed or performed during the hospital encounter of 08/04/19 (from the past 48 hour(s))  CBC with Differential/Platelet     Status: Abnormal   Collection Time: 08/08/19  4:46 AM  Result Value Ref Range   WBC 4.1 4.0 - 10.5 K/uL  RBC 3.67 (L) 4.22 - 5.81 MIL/uL   Hemoglobin 11.5 (L) 13.0 - 17.0 g/dL   HCT 35.5 (L) 39 - 52 %   MCV 96.7 80.0 - 100.0 fL   MCH 31.3 26.0 - 34.0 pg   MCHC 32.4 30.0 - 36.0 g/dL   RDW 12.5 11.5 - 15.5 %   Platelets 156 150 - 400 K/uL   nRBC 0.0 0.0 - 0.2 %   Neutrophils Relative % 55 %   Neutro Abs 2.3 1.7 - 7.7 K/uL   Lymphocytes Relative 24 %   Lymphs Abs 1.0 0.7 - 4.0 K/uL   Monocytes Relative 12 %   Monocytes Absolute 0.5 0 - 1 K/uL   Eosinophils Relative 7 %   Eosinophils Absolute 0.3 0 - 0 K/uL   Basophils Relative 1 %   Basophils Absolute 0.0 0 - 0 K/uL   Immature Granulocytes 1 %   Abs Immature Granulocytes 0.02 0.00 - 0.07 K/uL    Comment: Performed at [facility] [address] [place], [place] [address]  Comprehensive metabolic panel     Status: Abnormal   Collection Time: 08/08/19  4:46 AM  Result Value Ref Range   Sodium 138 135 - 145 mmol/L     Potassium 3.9 3.5 - 5.1 mmol/L   Chloride 106 98 - 111 mmol/L   CO2 24 22 - 32 mmol/L   Glucose, Bld 92 70 - 99 mg/dL    Comment: Glucose reference range applies only to samples taken after fasting for at least 8 hours.   BUN 12 8 - 23 mg/dL   Creatinine, Ser 0.98 0.61 - 1.24 mg/dL   Calcium 7.6 (L) 8.9 - 10.3 mg/dL   Total Protein 5.0 (L) 6.5 - 8.1 g/dL   Albumin 2.5 (L) 3.5 - 5.0 g/dL   AST 19 15 - 41 U/L   ALT 17 0 - 44 U/L   Alkaline Phosphatase 32 (L) 38 - 126 U/L   Total Bilirubin 1.5 (H) 0.3 - 1.2 mg/dL   GFR calc non Af Amer >60 >60 mL/min   GFR calc Af Amer >60 >60 mL/min   Anion gap 8 5 - 15    Comment: Performed at [facility] [address], [place], [place] [address]  Magnesium     Status: None   Collection Time: 08/08/19  4:46 AM  Result Value Ref Range   Magnesium 2.0 1.7 - 2.4 mg/dL    Comment: Performed at [facility] [address], [place], [place] [address]  Phosphorus     Status: Abnormal   Collection Time: 08/08/19  4:46 AM  Result Value Ref Range   Phosphorus 2.4 (L) 2.5 - 4.6 mg/dL    Comment: Performed at [facility] [address], [place],  [address]  CBC with Differential/Platelet     Status: Abnormal   Collection Time: 08/09/19  2:20 AM  Result Value Ref Range   WBC 4.6 4.0 - 10.5 K/uL   RBC 3.94 (L) 4.22 - 5.81 MIL/uL   Hemoglobin 12.2 (L) 13.0 - 17.0 g/dL   HCT 37.1 (L) 39 - 52 %   MCV 94.2 80.0 - 100.0 fL   MCH 31.0 26.0 - 34.0 pg   MCHC 32.9 30.0 - 36.0 g/dL   RDW 12.4 11.5 - 15.5 %   Platelets 195 150 - 400 K/uL   nRBC 0.0 0.0 - 0.2 %   Neutrophils Relative % 59 %  Neutro Abs 2.8 1.7 - 7.7 K/uL   Lymphocytes Relative 23 %   Lymphs Abs 1.1 0.7 - 4.0 K/uL   Monocytes Relative 12 %   Monocytes Absolute 0.5 0 - 1 K/uL   Eosinophils Relative 5 %   Eosinophils Absolute 0.2 0 - 0 K/uL   Basophils Relative 0 %   Basophils Absolute 0.0 0 - 0 K/uL   Immature Granulocytes 1 %   Abs Immature Granulocytes 0.03  0.00 - 0.07 K/uL    Comment: Performed at [facility] [address] [place], [place] [address]  Comprehensive metabolic panel     Status: Abnormal   Collection Time: 08/09/19  2:20 AM  Result Value Ref Range   Sodium 140 135 - 145 mmol/L   Potassium 4.2 3.5 - 5.1 mmol/L   Chloride 105 98 - 111 mmol/L   CO2 28 22 - 32 mmol/L   Glucose, Bld 100 (H) 70 - 99 mg/dL    Comment: Glucose reference range applies only to samples taken after fasting for at least 8 hours.   BUN 8 8 - 23 mg/dL   Creatinine, Ser 0.82 0.61 - 1.24 mg/dL   Calcium 8.6 (L) 8.9 - 10.3 mg/dL   Total Protein 5.2 (L) 6.5 - 8.1 g/dL   Albumin 2.6 (L) 3.5 - 5.0 g/dL   AST 18 15 - 41 U/L   ALT 18 0 - 44 U/L   Alkaline Phosphatase 36 (L) 38 - 126 U/L   Total Bilirubin 1.2 0.3 - 1.2 mg/dL   GFR calc non Af Amer >60 >60 mL/min   GFR calc Af Amer >60 >60 mL/min   Anion gap 7 5 - 15    Comment: Performed at [facility] [address], [place], [place] [address]  Magnesium     Status: None   Collection Time: 08/09/19  2:20 AM  Result Value Ref Range   Magnesium 1.8 1.7 - 2.4 mg/dL    Comment: Performed at [facility], [place] [address] [place], [place] [address]  Phosphorus     Status: None   Collection Time: 08/09/19  2:20 AM  Result Value Ref Range   Phosphorus 2.7 2.5 - 4.6 mg/dL    Comment: Performed at [facility] [address] [place], [place] [address]    Imaging / Studies: DG CHEST PORT 1 VIEW  Result Date: 08/08/2019 CLINICAL DATA:  79 year old male with history of abnormal chest x-ray. EXAM: PORTABLE CHEST 1 VIEW COMPARISON:  Chest x-ray 08/05/2019. FINDINGS: Previously noted nasogastric tube has been removed. Low lung volumes with bibasilar opacities which may reflect areas of atelectasis and/or consolidation. Small bilateral pleural effusions. No evidence of pulmonary edema. Heart size is normal. Upper mediastinal contours are within normal limits. IMPRESSION: 1. Low lung volumes  with bibasilar areas of atelectasis and/or consolidation. Small bilateral pleural effusions. Electronically Signed   By: Vinnie Langton M.D.   On: 08/08/2019 08:44    Medications / Allergies: per chart  Antibiotics: Anti-infectives (From admission, onward)   Start     Dose/Rate Route Frequency Ordered Stop   08/08/19 2100  Ampicillin-Sulbactam (UNASYN) 3 g in sodium chloride 0.9 % 100 mL IVPB        3 g 200 mL/hr over 30 Minutes Intravenous Every 6 hours 08/08/19 1823 08/09/19 0907   08/07/19 2200  Ampicillin-Sulbactam (UNASYN) 3 g in sodium chloride 0.9 % 100 mL IVPB  Status:  Discontinued  3 g 200 mL/hr over 30 Minutes Intravenous Every 6 hours 08/07/19 1659 08/08/19 1639   08/05/19 2300  piperacillin-tazobactam (ZOSYN) IVPB 3.375 g  Status:  Discontinued       "Followed by" Linked Group Details   3.375 g 12.5 mL/hr over 240 Minutes Intravenous Every 8 hours 08/05/19 1433 08/07/19 1647   08/05/19 1445  piperacillin-tazobactam (ZOSYN) IVPB 3.375 g       "Followed by" Linked Group Details   3.375 g 100 mL/hr over 30 Minutes Intravenous  Once 08/05/19 1433 08/05/19 1656        Note: Portions of this report may have been transcribed using voice recognition software. Every effort was made to ensure accuracy; however, inadvertent computerized transcription errors may be present.   Any transcriptional errors that result from this process are unintentional.    Adin Hector, MD, FACS, MASCRS Gastrointestinal and Minimally Invasive Surgery  [facility] [address], [place], [place] [address] [phone] Fax [phone] Main/Paging  CONTACT INFORMATION: Weekday (9AM-5PM) concerns: Call [facility] main office at [phone] Weeknight (5PM-9AM) or Weekend/Holiday concerns: Check [email or web address] for General Surgery [facility] coverage (Please, do not use SecureChat as it is not reliable communication to operating surgeons for immediate patient care)       08/09/2019  10:52 AM

## 2019-08-09 NOTE — Discharge Summary (Signed)
Physician Discharge Summary  Lonnie Schaefer LFY:[id number] DOB: Aug 19, 1940 DOA: 08/04/2019  PCP: [name], FNP  Admit date: 08/04/2019 Discharge date: 08/09/2019  Time spent: 40 minutes  Recommendations for Outpatient Follow-up:  1. Follow outpatient CBC/CMP 2. Follow up with surgery outpatient as needed 3. Follow chest x ray outpatient   Discharge Diagnoses:  Principal Problem:   SBO (small bowel obstruction) (HCC) Active Problems:   COPD (chronic obstructive pulmonary disease) (HCC)   Leukocytosis   AKI (acute kidney injury) ([place])   Lactic acidosis   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HCC)   Supplemental oxygen dependent   Discharge Condition: stable  Diet recommendation: heart healthy  Filed Weights   08/06/19 0500 08/07/19 0454  Weight: 65.7 kg 65.7 kg    History of present illness:  Lonnie Schaefer a 79 y.o.malewith medical history significant ofchronic hypoxemic respiratory failure secondary to underlying COPD-on 2 L of oxygen via nasal cannulae at home, recurrent SBO,history of prior exploratory laparotomy, umbilical hernia repair, laparoscopic appendectomy and laparoscopic cholecystectomy presents to emergency department due to abdominal pain, nausea vomiting and diarrhea.  Patient tells me that his symptoms started 3 days ago. Reports generalized abdominal pain associated with nausea, vomiting, nonbloody diarrhea and abdominal distention. Reports pain is currently 5 out of 10, nonradiating, no aggravating or relieving factors, achy in nature. He tells me that he had similar symptoms when he had SBO in the past. He had last bowel movement yesterday morning. He has been passing gas however denies fever, chills, decreased appetite, generalized weakness, lethargy.  No history of headache, blurry vision, chest pain, shortness of breath, wheezing, leg swelling, orthopnea, PND, urinary or sleep changes. He had one SBO which was treated with exploratory  laparotomy and 3 SBO's treated with medical management and resolved.  He lives with his wife at home. No history of smoking, alcohol, licit drug use.  He was admitted with a SBO.  Surgery was consulted.  NGT to LIS, small bowel protocol.  He's improved gradually with conservative measures.  Tolerating diet today, plan for discharge with outpatient surgery follow up as needed.  See below for additional details   Hospital Course:  Small bowel obstruction: -Patient presented with abdominal pain N/V/D. Has history of recurrent SBO -CT abd/pelvis with SBO involving proximal to mid jejunum -General surgery c/s -> per surgery, ok to discharge from surgery standpoint, follow up outpatient prn.  - mobilize, K>4 and mag >2  - APAP, oxy, morphine prn pain - Most recent abdominal plain film with colonic contrast with persistent SBO, c/w partial SBO  Systemic Inflammatory Response Syndrome  Fever  Leukocytosis: fever, leukocytosis, elevated lactate in setting of SBO.  No clear cause for fever on CT or CXR.   CXR with possible hazy opacity at R lung base  Follow blood cx NGTDx2 UA not c/w UTI Zosyn -> narrow to unasyn - s/p 5 days abx  Repeat CXR 7/4 with atelectasis vs consolidation, small bilateral effusions Follow repeat CXR outpatient   Chronic hypoxemic respiratory failure secondary to underlying COPD: Stable -Continue on 2 L of oxygen via nasal cannula. -No wheezing noted on exam. Chest x-ray as above.  AKI: Likely secondary to dehydration -Continue IV fluids. Monitor kidney function closely. Avoid nephrotoxic medication  Dehydration: -Patient urine is positive for ketones, lactic acid elevated -Continue IV fluids.  Elevated Bilirubin: improving gradually, follow.  CT abdomen/pelvis without focal liver abnormality, s/p cholecystectomy.  Procedures:  none  Consultations:  surgery  Discharge Exam: Vitals:  08/09/19 0531 08/09/19 1413  BP: (!) 141/73 117/74   Pulse: 66 72  Resp: 17 15  Temp: 98.5 F (36.9 C) 98.6 F (37 C)  SpO2: 95% 94%   Feels well Declines me calling family.  Eager to d/c home  General: No acute distress. Cardiovascular: Heart sounds show a regular rate, and rhythm.  Lungs: Clear to auscultation bilaterally  Abdomen: Soft, nontender, nondistended  Neurological: Alert and oriented 3. Moves all extremities 4 . Cranial nerves II through XII grossly intact. Skin: Warm and dry. No rashes or lesions. Extremities: No clubbing or cyanosis. No edema.  Discharge Instructions   Discharge Instructions    Call MD for:  difficulty breathing, headache or visual disturbances   Complete by: As directed    Call MD for:  extreme fatigue   Complete by: As directed    Call MD for:  hives   Complete by: As directed    Call MD for:  persistant dizziness or light-headedness   Complete by: As directed    Call MD for:  persistant nausea and vomiting   Complete by: As directed    Call MD for:  redness, tenderness, or signs of infection (pain, swelling, redness, odor or green/yellow discharge around incision site)   Complete by: As directed    Call MD for:  severe uncontrolled pain   Complete by: As directed    Call MD for:  temperature >100.4   Complete by: As directed    Diet - low sodium heart healthy   Complete by: As directed    Diet - low sodium heart healthy   Complete by: As directed    Discharge instructions   Complete by: As directed    You were seen for a small bowel obstruction.  You've improved with conservative management with nasogastric tube and suction and bowel rest.  Follow up with surgery outpatient as needed.    Please follow up outpatient with a chest x ray with your PCP.  Return for new, recurrent, or worsening symptoms.  Please ask your PCP to request records from this hospitalization so they know what was done and what the next steps will be.   Increase activity slowly   Complete by: As directed     Increase activity slowly   Complete by: As directed      Allergies as of 08/09/2019   No Known Allergies     Medication List    TAKE these medications   albuterol 108 (90 Base) MCG/ACT inhaler Commonly known as: VENTOLIN HFA Inhale 1 puff into the lungs every 6 (six) hours as needed for wheezing.   alendronate 70 MG tablet Commonly known as: FOSAMAX Take 70 mg by mouth every Wednesday.   aspirin 81 MG chewable tablet Chew 81 mg by mouth daily.   clobetasol ointment 0.05 % Commonly known as: TEMOVATE Apply 1 application topically 2 (two) times daily as needed (rash).   lactose free nutrition Liqd Take 237 mLs by mouth daily.   mupirocin cream 2 % Commonly known as: BACTROBAN Apply topically 2 (two) times daily. Apply to rash on the back   ondansetron 4 MG tablet Commonly known as: ZOFRAN Take 1 tablet (4 mg total) by mouth every 6 (six) hours.   OXYGEN Inhale 2 each into the lungs See admin instructions. Inhale 2 liters at bedtime   polyethylene glycol 17 g packet Commonly known as: MIRALAX / GLYCOLAX Take 17 g by mouth 2 (two) times daily.   tiotropium 18  MCG inhalation capsule Commonly known as: SPIRIVA Place 18 mcg into inhaler and inhale daily.      No Known Allergies  Follow-up Information    [name], FNP Follow up.   Specialty: Nurse Practitioner Contact information: [place] [address] [phone]        [facility], [facility] Follow up.   Specialty: General Surgery Why: follow up as needed Contact information: [place] [address] [phone]                The results of significant diagnostics from this hospitalization (including imaging, microbiology, ancillary and laboratory) are listed below for reference.    Significant Diagnostic Studies: DG Abd 1 View  Result Date: 08/05/2019 CLINICAL DATA:  Provided history: 64 French NG tube placed under  fluoro after multiple attempts at that side. 18 seconds fluoroscopy time. EXAM: ABDOMEN - 1 VIEW COMPARISON:  CT abdomen/pelvis 08/05/2019 FINDINGS: Problem oriented examination an enteric tube passes below the level of the left hemidiaphragm and coils in the region of the stomach with tip projecting in the region of the cardiac fundus. An additional small curvilinear opacity projects over the right lung base, which may overlie the patient. IMPRESSION: Problem-oriented examination demonstrates NG tube with tip terminating in the region of the gastric fundus. An additional small curvilinear opacity projects over the right lung base, which may overlie the patient. Correlation is recommended. Electronically Signed   By: Kellie Simmering DO   On: 08/05/2019 08:56   CT ABDOMEN PELVIS W CONTRAST  Result Date: 08/05/2019 CLINICAL DATA:  Abdominal pain, nausea and vomiting, diarrhea EXAM: CT ABDOMEN AND PELVIS WITH CONTRAST TECHNIQUE: Multidetector CT imaging of the abdomen and pelvis was performed using the standard protocol following bolus administration of intravenous contrast. CONTRAST:  132mL OMNIPAQUE IOHEXOL 300 MG/ML  SOLN COMPARISON:  04/21/2017 FINDINGS: Lower chest: Background emphysema without acute pleural or parenchymal lung disease. Hepatobiliary: Stable hepatic steatosis. No focal liver abnormality. Gallbladder surgically absent. Pancreas: Unremarkable. No pancreatic ductal dilatation or surrounding inflammatory changes. Spleen: Normal in size without focal abnormality. Adrenals/Urinary Tract: Stable left renal cyst. No urinary tract calculi or obstructive uropathy. Bladder is unremarkable. The adrenals are normal. Stomach/Bowel: There is marked distension of the proximal small bowel measuring up to 5.1 cm in diameter. Focal wall thickening within a segment of jejunum within the left mid abdomen reference image 52 is in the area where there is gradual transition from dilated to nondilated bowel. The distal  jejunum and ileum are decompressed. Minimal gas and stool throughout the colon. There is colonic diverticulosis without diverticulitis. Vascular/Lymphatic: Aortic atherosclerosis. No enlarged abdominal or pelvic lymph nodes. Reproductive: Prostate is unremarkable. Other: No free fluid or free gas. Small Richter hernia at the umbilicus containing a portion of the anti mesenteric wall of the mid jejunum. Musculoskeletal: No acute or destructive bony lesions. Reconstructed images demonstrate no additional findings. IMPRESSION: 1. Small bowel obstruction involving the proximal to mid jejunum, with transition point corresponding to an area of segmental wall thickening within the mid jejunum. 2. Stable hepatic steatosis. 3. Diverticulosis without diverticulitis. 4. 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 No bowel wall thickening or incarceration. 5. Aortic Atherosclerosis (ICD10-I70.0) and Emphysema (ICD10-J43.9). Electronically Signed   By: Randa Ngo M.D.   On: 08/05/2019 03:33   DG CHEST PORT 1 VIEW  Result Date: 08/08/2019 CLINICAL DATA:  79 year old male with history of abnormal chest x-ray. EXAM: PORTABLE CHEST  1 VIEW COMPARISON:  Chest x-ray 08/05/2019. FINDINGS: Previously noted nasogastric tube has been removed. Low lung volumes with bibasilar opacities which may reflect areas of atelectasis and/or consolidation. Small bilateral pleural effusions. No evidence of pulmonary edema. Heart size is normal. Upper mediastinal contours are within normal limits. IMPRESSION: 1. Low lung volumes with bibasilar areas of atelectasis and/or consolidation. Small bilateral pleural effusions. Electronically Signed   By: Vinnie Langton M.D.   On: 08/08/2019 08:44   DG Chest Portable 1 View  Result Date: 08/05/2019 CLINICAL DATA:  Fever EXAM: PORTABLE CHEST 1 VIEW COMPARISON:  06/06/2016, abdominal radiograph 08/05/2019 FINDINGS: Esophageal tube tip is in the left upper quadrant. Probable small left  pleural effusion. Atelectasis at the left base. Hazy atelectasis or minor infiltrate at the right base. Stable cardiomediastinal silhouette with aortic atherosclerosis. No pneumothorax. IMPRESSION: Suspected small left pleural effusion with basilar atelectasis. Hazy opacity at the right lung base, favor atelectasis over infiltrate. Electronically Signed   By: Donavan Foil M.D.   On: 08/05/2019 15:46   DG Abd Portable 1V-Small Bowel Obstruction Protocol-24 hr delay  Result Date: 08/06/2019 CLINICAL DATA:  Follow up small bowel obstruction EXAM: PORTABLE ABDOMEN - 1 VIEW COMPARISON:  08/05/2019 FINDINGS: Gastric catheter is noted coiled within the stomach. Persistent small bowel dilatation is noted although contrast material has passed through to the colon indicating a partial small bowel obstruction. IMPRESSION: Colonic contrast is now seen with persistent small bowel dilatation. These changes are consistent with a partial small bowel obstruction unchanged from the prior study. Electronically Signed   By: Inez Catalina M.D.   On: 08/06/2019 12:09   DG Abd Portable 1V-Small Bowel Obstruction Protocol-initial, 8 hr delay  Result Date: 08/05/2019 CLINICAL DATA:  Bowel obstruction EXAM: PORTABLE ABDOMEN - 1 VIEW COMPARISON:  08/05/2019, CT 08/05/2019 FINDINGS: The upper abdomen and diaphragm are incompletely imaged. Contrast visible within the stomach. Dilute contrast within dilated central small bowel. Small bowel is dilated up to 6.3 cm. No contrast present in the colon. Contrast within the urinary bladder. IMPRESSION: Dilute contrast present within the stomach and dilated small bowel consistent with bowel obstruction. No definitive colon contrast is visualized Electronically Signed   By: Donavan Foil M.D.   On: 08/05/2019 19:07   DG Abd Portable 1V-Small Bowel Protocol-Position Verification  Result Date: 08/05/2019 CLINICAL DATA:  NG tube placement. EXAM: PORTABLE ABDOMEN - 1 VIEW COMPARISON:  Prior abdomen  and CT 08/05/2019. FINDINGS: NG tube noted with tip coiled in stomach. Distended loops of small bowel noted consistent previously identified small-bowel obstruction. Curvilinear density previously noted over the right chest no longer identified. IMPRESSION: NG tube noted with tip coiled in stomach. Distended loops of small bowel noted with previously identified small bowel obstruction. 2. Curvilinear density previously noted over the right chest no longer identified. Electronically Signed   By: Marcello Moores  Register   On: 08/05/2019 09:02   DG [place] Dys Tube Plc W/Fl-No Rad  Result Date: 08/05/2019 CLINICAL DATA:  NG tube placement. EXAM: DG NASO G TUBE PLC W/FL-NO RAD CONTRAST:  None FLUOROSCOPY TIME:  Fluoroscopy Time:  0 minutes 48 seconds Radiation Exposure Index (if provided by the fluoroscopic device): 1.0 mGy COMPARISON:  CT 08/05/2019. FINDINGS: NG tube noted with tip below left hemidiaphragm. Curvilinear wire density noted over the right chest. Distended loops of small bowel noted. IMPRESSION: NG tube noted with tip in the stomach. Electronically Signed   By: Marcello Moores  Register   On: 08/05/2019 10:00    Microbiology:  Recent Results (from the past 240 hour(s))  SARS Coronavirus 2 by RT PCR (hospital order, performed in [facility] hospital lab) Nasopharyngeal Nasopharyngeal Swab     Status: None   Collection Time: 08/05/19  3:57 AM   Specimen: Nasopharyngeal Swab  Result Value Ref Range Status   SARS Coronavirus 2 NEGATIVE NEGATIVE Final    Comment: (NOTE) SARS-CoV-2 target nucleic acids are NOT DETECTED.  The SARS-CoV-2 RNA is generally detectable in upper and lower respiratory specimens during the acute phase of infection. The lowest concentration of SARS-CoV-2 viral copies this assay can detect is 250 copies / mL. A negative result does not preclude SARS-CoV-2 infection and should not be used as the sole basis for treatment or other patient management decisions.  A negative result may occur  with improper specimen collection / handling, submission of specimen other than nasopharyngeal swab, presence of viral mutation(s) within the areas targeted by this assay, and inadequate number of viral copies (<250 copies / mL). A negative result must be combined with clinical observations, patient history, and epidemiological information.  Fact Sheet for Patients:   StrictlyIdeas.no  Fact Sheet for Healthcare Providers: BankingDealers.co.za  This test is not yet approved or  cleared by the [place] FDA and has been authorized for detection and/or diagnosis of SARS-CoV-2 by FDA under an Emergency Use Authorization (EUA).  This EUA will remain in effect (meaning this test can be used) for the duration of the COVID-19 declaration under Section 564(b)(1) of the Act, 21 U.S.C. section 360bbb-3(b)(1), unless the authorization is terminated or revoked sooner.  Performed at [facility], [place] [address], [place], [place] [address]   Blood culture (routine x 2)     Status: None (Preliminary result)   Collection Time: 08/05/19  2:27 PM   Specimen: BLOOD  Result Value Ref Range Status   Specimen Description BLOOD LEFT ANTECUBITAL  Final   Special Requests   Final    BOTTLES DRAWN AEROBIC AND ANAEROBIC Blood Culture results may not be optimal due to an excessive volume of blood received in culture bottles   Culture   Final    NO GROWTH 4 DAYS Performed at [facility], [place] [address], [place], [place] [address]    Report Status PENDING  Incomplete  Blood culture (routine x 2)     Status: None (Preliminary result)   Collection Time: 08/05/19  2:32 PM   Specimen: BLOOD RIGHT WRIST  Result Value Ref Range Status   Specimen Description BLOOD RIGHT WRIST  Final   Special Requests   Final    BOTTLES DRAWN AEROBIC AND ANAEROBIC Blood Culture results may not be optimal due to an inadequate volume of blood received in culture  bottles   Culture   Final    NO GROWTH 4 DAYS Performed at [facility], [place] [address], [place], [place] [address]    Report Status PENDING  Incomplete     Labs: Basic Metabolic Panel: Recent Labs  Lab 08/04/19 1730 08/04/19 1730 08/05/19 1432 08/06/19 0151 08/07/19 0124 08/08/19 0446 08/09/19 0220  NA 142  --   --  144 144 138 140  K 4.0  --   --  4.2 4.5 3.9 4.2  CL 101  --   --  107 108 106 105  CO2 27  --   --  27 28 24 28   GLUCOSE 193*  --   --  102* 82 92 100*  BUN 21  --   --  26* 21 12 8   CREATININE 1.37*   < > 1.06 1.29* 1.06 0.98 0.82  CALCIUM 10.2  --   --  8.7* 8.0* 7.6* 8.6*  MG  --   --  2.2  --  2.0 2.0 1.8  PHOS  --   --  3.6  --  2.8 2.4* 2.7   < > = values in this interval not displayed.   Liver Function Tests: Recent Labs  Lab 08/04/19 1730 08/06/19 0151 08/07/19 0124 08/08/19 0446 08/09/19 0220  AST 22 17 21 19 18   ALT 17 15 17 17 18   ALKPHOS 54 41 35* 32* 36*  BILITOT 2.7* 1.8* 1.6* 1.5* 1.2  PROT 7.6 6.0* 5.3* 5.0* 5.2*  ALBUMIN 3.9 3.3* 2.8* 2.5* 2.6*   Recent Labs  Lab 08/04/19 1730  LIPASE 23   No results for input(s): AMMONIA in the last 168 hours. CBC: Recent Labs  Lab 08/04/19 1730 08/05/19 1432 08/06/19 0151 08/08/19 0446 08/09/19 0220  WBC 18.4* 12.6* 10.9* 4.1 4.6  NEUTROABS  --  10.4*  --  2.3 2.8  HGB 15.6 14.7 13.1 11.5* 12.2*  HCT 48.0 45.3 41.3 35.5* 37.1*  MCV 95.6 96.2 98.3 96.7 94.2  PLT 312 259 244 156 195   Cardiac Enzymes: No results for input(s): CKTOTAL, CKMB, CKMBINDEX, TROPONINI in the last 168 hours. BNP: BNP (last 3 results) No results for input(s): BNP in the last 8760 hours.  ProBNP (last 3 results) No results for input(s): PROBNP in the last 8760 hours.  CBG: No results for input(s): GLUCAP in the last 168 hours.     Signed:  Fayrene Helper MD.  [facility] 08/09/2019, 6:28 PM

## 2019-08-10 LAB — CULTURE, BLOOD (ROUTINE X 2)
Culture: NO GROWTH
Culture: NO GROWTH

## 2019-08-30 ENCOUNTER — Institutional Professional Consult (permissible substitution): Payer: Medicare Other | Admitting: Pulmonary Disease

## 2019-08-30 ENCOUNTER — Other Ambulatory Visit (HOSPITAL_COMMUNITY): Payer: Self-pay | Admitting: Otolaryngology

## 2019-08-30 ENCOUNTER — Other Ambulatory Visit: Payer: Self-pay | Admitting: Otolaryngology

## 2019-08-30 DIAGNOSIS — K1370 Unspecified lesions of oral mucosa: Secondary | ICD-10-CM

## 2019-08-30 DIAGNOSIS — Z85819 Personal history of malignant neoplasm of unspecified site of lip, oral cavity, and pharynx: Secondary | ICD-10-CM

## 2019-08-30 DIAGNOSIS — J449 Chronic obstructive pulmonary disease, unspecified: Secondary | ICD-10-CM

## 2019-09-03 ENCOUNTER — Other Ambulatory Visit: Payer: Self-pay

## 2019-09-03 ENCOUNTER — Ambulatory Visit (HOSPITAL_COMMUNITY)
Admission: RE | Admit: 2019-09-03 | Discharge: 2019-09-03 | Disposition: A | Discharge status: Home / Self Care | Payer: Medicare Other | Source: Ambulatory Visit | Attending: Otolaryngology | Admitting: Otolaryngology

## 2019-09-03 DIAGNOSIS — K1370 Unspecified lesions of oral mucosa: Secondary | ICD-10-CM | POA: Diagnosis present

## 2019-09-03 DIAGNOSIS — J449 Chronic obstructive pulmonary disease, unspecified: Secondary | ICD-10-CM | POA: Diagnosis present

## 2019-09-03 DIAGNOSIS — Z85819 Personal history of malignant neoplasm of unspecified site of lip, oral cavity, and pharynx: Secondary | ICD-10-CM | POA: Diagnosis present

## 2019-09-03 MED ORDER — IOHEXOL 300 MG/ML  SOLN
75.0000 mL | Freq: Once | INTRAMUSCULAR | Status: AC | PRN
  Administered 2019-09-03: 75 mL via INTRAVENOUS

## 2019-09-20 ENCOUNTER — Other Ambulatory Visit: Payer: Self-pay | Admitting: Otolaryngology

## 2019-09-23 ENCOUNTER — Ambulatory Visit: Payer: Medicare Other | Admitting: Pulmonary Disease

## 2019-09-23 ENCOUNTER — Other Ambulatory Visit: Payer: Self-pay

## 2019-09-23 ENCOUNTER — Encounter: Payer: Self-pay | Admitting: Pulmonary Disease

## 2019-09-23 VITALS — BP 124/74 | HR 98 | Temp 98.9°F | Ht 71.0 in | Wt 157.0 lb

## 2019-09-23 DIAGNOSIS — G4736 Sleep related hypoventilation in conditions classified elsewhere: Secondary | ICD-10-CM | POA: Diagnosis not present

## 2019-09-23 DIAGNOSIS — Z01811 Encounter for preprocedural respiratory examination: Secondary | ICD-10-CM | POA: Diagnosis not present

## 2019-09-23 DIAGNOSIS — J449 Chronic obstructive pulmonary disease, unspecified: Secondary | ICD-10-CM | POA: Diagnosis not present

## 2019-09-23 DIAGNOSIS — J439 Emphysema, unspecified: Secondary | ICD-10-CM | POA: Diagnosis not present

## 2019-09-23 MED ORDER — STIOLTO RESPIMAT 2.5-2.5 MCG/ACT IN AERS
2.0000 | INHALATION_SPRAY | Freq: Every day | RESPIRATORY_TRACT | 0 refills | Status: AC
Start: 2019-09-23 — End: 2019-09-24

## 2019-09-23 NOTE — Patient Instructions (Signed)
You did well with your walk today.  Your oxygen held up.  We will switch her Spiriva to Stiolto 2 inhalations daily let us know how this does for you so we can call in the prescription for you.  We will see you in follow-up in 3 months time either here or at the [place] office whichever is closest to you.  We will need breathing test in the future however these cannot be performed currently due to the COVID-19 restrictions on staffing and the current pandemic surge.

## 2019-09-23 NOTE — Progress Notes (Signed)
Subjective:    Patient ID: Lonnie Schaefer, male    DOB: 23-Dec-1940, 79 y.o.   MRN: [id number]  HPI Patient is a 79 year old former smoker (quit 2001) previously followed at [facility] [place] by Dr. Christinia Gully (last visit January 2009) who presents here for preoperative evaluation for excision of an oral cancer.  Patient states that the surgery will be on 04 October 2019.  Prior FEV1 2 years ago was 1.54 L FEV1/FVC 62% consistent with moderate COPD.  He has been diagnosed with COPD for 10 to 12 years.  Has dyspnea only on heavy exertion for as many years.  This has not worsened recently.  No chest pain, no orthopnea or paroxysmal nocturnal dyspnea.  He has occasional dysphagia due to his oral issues. No gastroesophageal reflux or heartburn.  He is on Spiriva for maintenance and this has not been changed since COPD diagnosis.  Chronic morning cough productive of scant whitish sputum.  No hemoptysis.  No fevers, chills or sweats.  COPD exacerbation ever.  Review of Systems A 10 point review of systems was performed and it is as noted above otherwise negative.  Past Medical History:  Diagnosis Date  . Bowel obstruction ([place]) 06/2009-10/01/2013 X 5; 04/03/2016  . COPD (chronic obstructive pulmonary disease) ([place])   . H/O hiatal hernia   . History of stomach ulcers ~ 1960  . Hypoxia    idiopathic sleep related hypoxia  . Impaired fasting glucose   . On home oxygen therapy    "2L q hs" (04/03/2016)  . Oral cancer ([place])   . Osteoporosis   . Pneumonia 1990s X 1  . SBO (small bowel obstruction) ([place]) 04/21/2017  . Shortness of breath    Past Surgical History:  Procedure Laterality Date  . ABDOMINAL EXPLORATION SURGERY  06/2009; 10/2009  . ABDOMINAL SURGERY    . APPENDECTOMY  10/2009  . CHOLECYSTECTOMY N/A 09/06/2016   Procedure: LAPAROSCOPIC CHOLECYSTECTOMY WITH  INTRAOPERATIVE CHOLANGIOGRAM;  Surgeon: Ralene Ok, MD;  Location: [facility];  Service: General;  Laterality: N/A;  .  COLONOSCOPY    . ESOPHAGOGASTRODUODENOSCOPY     Dr Oletta Lamas   . GUM SURGERY    . GUM SURGERY     upper gum surgery, cancer   . HERNIA REPAIR     "[employer]"  . 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 UMBILICAL HERNIA REPAIR  06/2009   w/mesh   Family History  Problem Relation Age of Onset  . Pneumonia Mother    Social History   Tobacco Use  . Smoking status: Former Smoker    Packs/day: 2.00    Years: 50.00    Pack years: 100.00    Types: Cigarettes    Quit date: 10/16/1999    Years since quitting: 19.9  . Smokeless tobacco: Never Used  Substance Use Topics  . 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  No Known Allergies  Current Meds  Medication Sig  . albuterol (PROVENTIL HFA;VENTOLIN HFA) 108 (90 BASE) MCG/ACT inhaler Inhale 1 puff into the lungs every 6 (six) hours as needed for wheezing.   Marland Kitchen alendronate (FOSAMAX) 70 MG tablet Take 70 mg by mouth every Wednesday.  Marland Kitchen aspirin 81 MG chewable tablet Chew 81 mg by mouth daily.  . clobetasol ointment (TEMOVATE) 2.83 % Apply 1 application topically 2 (two) times daily as needed (  rash).  . lactose free nutrition (BOOST) LIQD Take 237 mLs by mouth daily.  . mupirocin cream (BACTROBAN) 2 % Apply topically 2 (two) times daily. Apply to rash on the back  . ondansetron (ZOFRAN) 4 MG tablet Take 1 tablet (4 mg total) by mouth every 6 (six) hours.  Donell Sievert IN Inhale 2 each into the lungs See admin instructions. Inhale 2 liters at bedtime  . polyethylene glycol (MIRALAX / GLYCOLAX) 17 g packet Take 17 g by mouth 2 (two) times daily.  Marland Kitchen tiotropium (SPIRIVA) 18 MCG inhalation capsule Place 18 mcg into inhaler and inhale daily.   Immunization History  Administered Date(s) Administered  . Influenza, High Dose Seasonal PF 11/10/2014, 11/04/2017, 11/14/2018  . Influenza-Unspecified 12/05/2010  . Moderna SARS-COVID-2  Vaccination 03/19/2019, 04/16/2019       Objective:   Physical Exam BP 124/74 (BP Location: Left Arm, Cuff Size: Normal)   Pulse 98   Temp 98.9 F (37.2 C) (Temporal)   Ht 5\' 11"  (1.803 m)   Wt 157 lb (71.2 kg)   SpO2 95%   BMI 21.90 kg/m   GENERAL: Very slender gentleman, no acute distress.  Fully ambulatory. HEAD: Normocephalic, atraumatic.  EYES: Pupils equal, round, reactive to light.  No scleral icterus.  MOUTH: Nose/mouth/throat not examined due to masking requirements for COVID 19. NECK: Supple. No thyromegaly. Trachea midline. No JVD.  No adenopathy. PULMONARY: Good air entry bilaterally, moving air well.  Coarse breath sounds with no other adventitious sounds. CARDIOVASCULAR: S1 and S2. Regular rate and rhythm.  No rubs, murmurs or gallops heard. ABDOMEN: Scaphoid, benign. MUSCULOSKELETAL: No joint deformity, no clubbing, no edema.  NEUROLOGIC: No focal deficit, speech fluent with mild impediment due to prior oral surgery. SKIN: Intact,warm,dry.  Limited exam: No rashes. PSYCH: Jovial mood, normal behavior  Ambulatory oximetry today: Showed no oxygen desaturations past 90%.  Patient was able to ambulate close to 1000 feet without difficulty.  Performed on 08 August 2019: Lung volumes and atelectasis with small pleural effusions note that this was during an admission for small bowel obstruction.      Assessment & Plan:     ICD-10-CM   1. COPD with chronic bronchitis and emphysema ([place])  J44.9    Appears to be fairly well compensated PFTs cannot be performed in short time allotted due to COVID-19 surge Switch Spiriva to Stiolto 2 inhalations daily  2. Nocturnal hypoxemia due to emphysema ([facility])  J43.9    G47.36    Continue oxygen at 2 L/min nocturnally  3. Preop respiratory exam  Z01.811    No recent exacerbation of COPD Appears to be fairly well compensated Moderate risk for general anesthesia Risk cannot be further reduced   Meds ordered this encounter    Medications  . Tiotropium Bromide-Olodaterol (STIOLTO RESPIMAT) 2.5-2.5 MCG/ACT AERS    Sig: Inhale 2 puffs into the lungs daily for 1 day.    Dispense:  8 g    Refill:  0    Order Specific Question:   Lot Number?    Answer:   [id number]    Order Specific Question:   Expiration Date?    Answer:   07/05/2021    Order Specific Question:   Quantity    Answer:   2   Discussion:  Patient appears to be fairly well compensated from the COPD standpoint.  He did not have clinically significant oxygen desaturations with ambulation today.  Appears to be able to do activities of daily living without difficulty  and even can do all of his yard work and gardening chores without difficulty.  Currently PFTs cannot be obtained in the short time allotted prior to the proposed procedure given the current staffing constraints with COVID-19.  However clinically the patient appears to be doing well.  He is a moderate risk for general anesthesia this risk cannot be further reduced.  Should patient had difficulties post procedure contact [facility] for inpatient consultation.  Renold Don, MD [facility]  *This note was dictated using voice recognition software/Dragon.  Despite best efforts to proofread, errors can occur which can change the meaning.  Any change was purely unintentional.

## 2019-09-29 NOTE — H&P (Signed)
HPI:   Lonnie Schaefer is a 79 y.o. male who presents as a return Patient.   Referring Provider: Adah Salvage,*  Chief complaint: Sore spot roof of mouth.  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 I believe he had surgery without radiation. He was last seen by Dr. Janace Hoard in April and then a few weeks later he developed a sore spot in the roof of the mouth that feels similar to when he had the cancer years ago. It has been progressively getting worse. He quit smoking years ago. He has COPD for which he takes 2 L of oxygen only at night. He is able to walk up a flight of stairs but sometimes has to stop to catch his breath. He has not had any surgery in a few years. He is not on blood thinner.  PMH/Meds/All/SocHx/FamHx/ROS:   Past Medical History:  Diagnosis Date  . Cancer [facility])   Past Surgical History:  Procedure Laterality Date  . CHOLECYSTECTOMY  . HERNIA REPAIR   No family history of bleeding disorders, wound healing problems or difficulty with anesthesia.   Social History   Socioeconomic History  . Marital status: Married  Spouse name: Not on file  . Number of children: Not on file  . Years of education: Not on file  . Highest education level: Not on file  Occupational History  . Not on file  Tobacco Use  . Smoking status: Former Smoker  Types: Cigarettes  . Smokeless tobacco: Never Used  Vaping Use  . Vaping Use: Never used  Substance and Sexual Activity  . Alcohol use: No  . Drug use: No  . Sexual activity: Not on file  Other Topics Concern  . Not on file  Social History Narrative  . Not on file   Social Determinants of Health   Financial Resource Strain:  . Difficulty of Paying Living Expenses:  Food Insecurity:  . Worried About Charity fundraiser in the Last Year:  . Arboriculturist in the Last Year:  Transportation Needs:  . Film/video editor (Medical):  Marland Kitchen Lack of Transportation (Non-Medical):  Physical  Activity:  . Days of Exercise per Week:  . Minutes of Exercise per Session:  Stress:  . Feeling of Stress :  Social Connections:  . Frequency of Communication with Friends and Family:  . Frequency of Social Gatherings with Friends and Family:  . Attends Religious Services:  . Active Member of Clubs or Organizations:  . Attends Archivist Meetings:  Marland Kitchen Marital Status:   Current Outpatient Medications:  . albuterol 90 mcg/actuation inhaler, Inhale 1 puff into the lungs., Disp: , Rfl:  . alendronate (FOSAMAX) 70 MG tablet, , Disp: , Rfl: 0 . aspirin 81 MG chewable tablet *ANTIPLATELET*, Take by mouth., Disp: , Rfl:  . clobetasol (TEMOVATE) 0.05 % ointment, Apply 1 application topically 2 (two) times daily as needed (rash)., Disp: , Rfl:  . HELIUM-OXYGEN INHL, Inhale 2 each into the lungs., Disp: , Rfl:  . pedi nutrition,iron,lact-free 0.04-1.5 gram-kcal/mL Liqd, Take 237 mLs by mouth., Disp: , Rfl:  . polyethylene glycol (GLYCOLAX, MIRALAX) 17 gram/dose powder, Take by mouth., Disp: , Rfl:  . SPIRIVA WITH HANDIHALER 18 mcg inhalation capsule, , Disp: , Rfl: 1 . traMADol (ULTRAM) 50 mg tablet, take 1 tablet by mouth every 12 hours if needed for pain, Disp: , Rfl: 0  A complete ROS was performed with pertinent positives/negatives noted in the  HPI. The remainder of the ROS are negative.   Physical Exam:   BP 122/61  Pulse 89  Temp (!) 96.6 F (35.9 C)  Ht 1.651 m (5\' 5" )  Wt 72.1 kg (159 lb)  BMI 26.46 kg/m   General: Healthy and alert, in no distress, breathing easily. Normal affect. In a pleasant mood. Head: Normocephalic, atraumatic. No masses, or scars. Eyes: Pupils are equal, and reactive to light. Vision is grossly intact. No spontaneous or gaze nystagmus. Ears: External ears appear healthy. Hearing: Grossly normal. Nose: Nasal cavities are clear with healthy mucosa, no polyps or exudate. Airways are patent. Face: No masses or scars, facial nerve function is  symmetric. Oral Cavity: He is edentulous. Tongue floor of mouth buccal mucosa all healthy. 1-1/2 cm raised very tender lesion along the posterior hard palate on the left centered around the tuberosity. Oropharynx: There are no mucosal masses identified. Tongue base appears normal and healthy. Larynx/Hypopharynx: deferred Chest: Deferred Neck: No palpable masses, no cervical adenopathy, no thyroid nodules or enlargement. Neuro: Cranial nerves II-XII with normal function. Balance: Normal gate. Other findings: none.  Independent Review of Additional Tests or Records:  none  Procedures:  none  Impression & Plans:  Suspicious lesion posterior left hard palate. Recommend pulmonary clearance followed by CT of the neck and then excision of this area under general anesthesia if possible.

## 2019-09-29 NOTE — Progress Notes (Signed)
[facility] [phone] - [address], [address] [place] [address] Phone: [phone] Fax: [phone]  [facility] [phone] - [place], [place] - [address] [place] [address] Phone: [phone] Fax: [phone]      Your procedure is scheduled on 10/04/19.  Report to [facility] Main Entrance "A" at 5:30 A.M., and check in at the Admitting office.  Call this number if you have problems the morning of surgery:  [phone]  Call [phone] if you have any questions prior to your surgery date Monday-Friday 8am-4pm    Remember:  Do not eat or drink after midnight the night before your surgery    Take these medicines the morning of surgery with A SIP OF WATER: tiotropium (SPIRIVA) albuterol (PROVENTIL HFA;VENTOLIN HFA) - as needed  As of today, STOP taking any Aspirin (unless otherwise instructed by your surgeon) Aleve, Naproxen, Ibuprofen, Motrin, Advil, Goody's, BC's, all herbal medications, fish oil, and all vitamins.                      Do not wear jewelry            Do not wear lotions, powders, colognes, or deodorant.            Do not shave 48 hours prior to surgery.  Men may shave face and neck.            Do not bring valuables to the hospital.            [facility] is not responsible for any belongings or valuables.  Do NOT Smoke (Tobacco/Vaping) or drink Alcohol 24 hours prior to your procedure If you use a CPAP at night, you may bring all equipment for your overnight stay.   Contacts, glasses, dentures or bridgework may not be worn into surgery.      For patients admitted to the hospital, discharge time will be determined by your treatment team.   Patients discharged the day of surgery will not be allowed to drive home, and someone needs to stay with them for 24 hours.    Special instructions:    [facility]- Preparing For Surgery  Before surgery, you can play an important role. Because skin is not sterile, your skin needs to be as free of germs as possible. You can reduce the number of germs on your skin by washing with CHG (chlorahexidine gluconate) Soap before surgery.  CHG is an antiseptic cleaner which kills germs and bonds with the skin to continue killing germs even after washing.    Oral Hygiene is also important to reduce your risk of infection.  Remember - BRUSH YOUR TEETH THE MORNING OF SURGERY WITH YOUR REGULAR TOOTHPASTE  Please do not use if you have an allergy to CHG or antibacterial soaps. If your skin becomes reddened/irritated stop using the CHG.  Do not shave (including legs and underarms) for at least 48 hours prior to first CHG shower. It is OK to shave your face.  Please follow these instructions carefully.   1. Shower the NIGHT BEFORE SURGERY and the MORNING OF SURGERY with CHG Soap.   2. If you chose to wash your hair, wash your hair first as usual with your normal shampoo.  3. After you shampoo, rinse your hair and body thoroughly to remove the shampoo.  4. Use CHG as you would any  other liquid soap. You can apply CHG directly to the skin and wash gently with a scrungie or a clean washcloth.   5. Apply the CHG Soap to your body ONLY FROM THE NECK DOWN.  Do not use on open wounds or open sores. Avoid contact with your eyes, ears, mouth and genitals (private parts). Wash Face and genitals (private parts)  with your normal soap.   6. Wash thoroughly, paying special attention to the area where your surgery will be performed.  7. Thoroughly rinse your body with warm water from the neck down.  8. DO NOT shower/wash with your normal soap after using and rinsing off the CHG Soap.  9. Pat yourself dry with a CLEAN TOWEL.  10. Wear CLEAN PAJAMAS to bed the night before surgery  11. Place CLEAN SHEETS on your bed the night of your first shower and DO NOT SLEEP  WITH PETS.   Day of Surgery: Wear Clean/Comfortable clothing the morning of surgery Do not apply any deodorants/lotions.   Remember to brush your teeth WITH YOUR REGULAR TOOTHPASTE.   Please read over the following fact sheets that you were given.

## 2019-09-30 ENCOUNTER — Other Ambulatory Visit: Payer: Self-pay

## 2019-09-30 ENCOUNTER — Encounter (HOSPITAL_COMMUNITY)
Admission: RE | Admit: 2019-09-30 | Discharge: 2019-09-30 | Disposition: A | Discharge status: Home / Self Care | Payer: Medicare Other | Source: Ambulatory Visit | Attending: Otolaryngology | Admitting: Otolaryngology

## 2019-09-30 ENCOUNTER — Encounter (HOSPITAL_COMMUNITY): Payer: Self-pay

## 2019-09-30 ENCOUNTER — Other Ambulatory Visit (HOSPITAL_COMMUNITY)
Admission: RE | Admit: 2019-09-30 | Discharge: 2019-09-30 | Disposition: A | Discharge status: Home / Self Care | Payer: Medicare Other | Source: Ambulatory Visit | Attending: Otolaryngology | Admitting: Otolaryngology

## 2019-09-30 DIAGNOSIS — K1370 Unspecified lesions of oral mucosa: Secondary | ICD-10-CM | POA: Insufficient documentation

## 2019-09-30 DIAGNOSIS — Z20822 Contact with and (suspected) exposure to covid-19: Secondary | ICD-10-CM | POA: Insufficient documentation

## 2019-09-30 DIAGNOSIS — Z7982 Long term (current) use of aspirin: Secondary | ICD-10-CM | POA: Insufficient documentation

## 2019-09-30 DIAGNOSIS — Z79899 Other long term (current) drug therapy: Secondary | ICD-10-CM | POA: Diagnosis not present

## 2019-09-30 DIAGNOSIS — J449 Chronic obstructive pulmonary disease, unspecified: Secondary | ICD-10-CM | POA: Insufficient documentation

## 2019-09-30 DIAGNOSIS — Z85818 Personal history of malignant neoplasm of other sites of lip, oral cavity, and pharynx: Secondary | ICD-10-CM | POA: Insufficient documentation

## 2019-09-30 DIAGNOSIS — Z87891 Personal history of nicotine dependence: Secondary | ICD-10-CM | POA: Insufficient documentation

## 2019-09-30 DIAGNOSIS — Z01812 Encounter for preprocedural laboratory examination: Secondary | ICD-10-CM | POA: Diagnosis present

## 2019-09-30 LAB — CBC
HCT: 40.3 % (ref 39.0–52.0)
Hemoglobin: 13 g/dL (ref 13.0–17.0)
MCH: 31.4 pg (ref 26.0–34.0)
MCHC: 32.3 g/dL (ref 30.0–36.0)
MCV: 97.3 fL (ref 80.0–100.0)
Platelets: 316 10*3/uL (ref 150–400)
RBC: 4.14 MIL/uL — ABNORMAL LOW (ref 4.22–5.81)
RDW: 13.3 % (ref 11.5–15.5)
WBC: 9.7 10*3/uL (ref 4.0–10.5)
nRBC: 0 % (ref 0.0–0.2)

## 2019-09-30 LAB — SARS CORONAVIRUS 2 (TAT 6-24 HRS): SARS Coronavirus 2: NEGATIVE

## 2019-09-30 NOTE — Progress Notes (Addendum)
PCP - [name] FNP Cardiologist - Denies  PPM/ICD - Denies  Chest x-ray - 08/13/19 EKG - 08/05/19 Stress Test - 01/22/05 ECHO - Denies Cardiac Cath - Denies  Sleep Study - Denies  Pt denies being diabetic.  Blood Thinner Instructions: N/A Aspirin Instructions: Pt instructed to stop aspirin.  ERAS Protcol - No  COVID TEST- 09/30/19   Anesthesia review: Yes, on 2L O2 at night  Patient denies shortness of breath, fever, cough and chest pain at PAT appointment   All instructions explained to the patient, with a verbal understanding of the material. Patient agrees to go over the instructions while at home for a better understanding. Patient also instructed to self quarantine after being tested for COVID-19. The opportunity to ask questions was provided.

## 2019-10-01 NOTE — Anesthesia Preprocedure Evaluation (Addendum)
Anesthesia Evaluation  Patient identified by MRN, date of birth, ID band Patient awake    Reviewed: Allergy & Precautions, NPO status , Patient's Chart, lab work & pertinent test results  Airway Mallampati: I  TM Distance: >3 FB Neck ROM: Full    Dental  (+) Edentulous Upper, Edentulous Lower   Pulmonary COPD (2L at night),  oxygen dependent, former smoker,    Pulmonary exam normal breath sounds clear to auscultation       Cardiovascular negative cardio ROS Normal cardiovascular exam Rhythm:Regular Rate:Normal     Neuro/Psych negative neurological ROS  negative psych ROS   GI/Hepatic Neg liver ROS, hiatal hernia,   Endo/Other  negative endocrine ROS  Renal/GU negative Renal ROS  negative genitourinary   Musculoskeletal negative musculoskeletal ROS (+)   Abdominal   Peds  Hematology negative hematology ROS (+)   Anesthesia Other Findings 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 No specific clinical information was given. - Aortic Atherosclerosis (ICD10-I70.0) and Emphysema (ICD10-J43.9). 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 Because of stability since 2012, No no follow-up is recommended. Followup recommended(ref: J Am Coll Radiol. 2015 Feb;12(2): 143-50).  EKG: 08/04/19: Sinus tachycardia at 105 bpm Septal infarct , age undetermined Abnormal ECG - Baseline fine artifact is present, but overall, EKG appears stable when compared to 04/11/17 tracing.   CV: Normal nuclear stress test, EF 58% 01/22/05.   Reproductive/Obstetrics                            Anesthesia Physical Anesthesia Plan  ASA: III  Anesthesia Plan: General   Post-op Pain Management:    Induction: Intravenous  PONV Risk Score and Plan: 2 and Dexamethasone  and Ondansetron  Airway Management Planned: Oral ETT  Additional Equipment:   Intra-op Plan:   Post-operative Plan: Extubation in OR  Informed Consent: I have reviewed the patients History and Physical, chart, labs and discussed the procedure including the risks, benefits and alternatives for the proposed anesthesia with the patient or authorized representative who has indicated his/her understanding and acceptance.     Dental advisory given  Plan Discussed with: CRNA  Anesthesia Plan Comments: ( )      Anesthesia Quick Evaluation

## 2019-10-01 NOTE — Progress Notes (Signed)
Anesthesia Chart Review:  Case: [id number] Date/Time: 10/04/19 0715   Procedure: ORAL LESION REMOVAL W/BIOPSY (N/A )   Anesthesia type: General   Pre-op diagnosis: oral lesion, history of cancer of hard palat   Location: [facility] OR ROOM 02 / [place] [facility]   Surgeons: [name], MD      DISCUSSION: Patient is a 79 year old Schaefer scheduled for the above procedure.  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 Impaired fasting glucose was entered into his history on 01/14/18; however, his A1c was normal at 5.3% on 12/10/17 ([place]).     He was evaluated by pulmonologist Dr. Patsey Berthold on 09/23/19. She wrote,  "Preop respiratory exam No recent exacerbation of COPD Appears to be fairly well compensated Moderate risk for general anesthesia Risk cannot be further reduced".  Preoperative CBC done at 09/30/19 PAT RN visit. H/H and PLT count WNL. Last CMET done 08/09/19 and showed glucose 100, Cr 0.82, AST 18, ALT 18. No known DM or HTN and last Creatinine was normal.   Preoperative COVID-19 test on 8/26/1 was negative. Anesthesia team to evaluate on the day of surgery.     VS: BP 125/62   Pulse 95   Temp 36.9 C (Oral)   Resp 20   Ht 5\' 11"  (1.803 m)   Wt 71.9 kg   SpO2 100%   BMI 22.12 kg/m    PROVIDERS: [name], FNP is PCP ([place]). Telemedicine visit 08/13/19.  Vernard Gambles, MD is pulmonologist.    LABS: Labs reviewed: Acceptable for surgery. See DISCUSSION.  (all labs ordered are listed, but only abnormal results are displayed)  Labs Reviewed  CBC - Abnormal; Notable for the following components:      Result Value   RBC 4.14 (*)    All other components within normal limits     IMAGES: CXR 09/14/19  ([facility]): FINDINGS: Lungs are hyperinflated. Apical pleural scarring present bilaterally.No infiltrates or effusions. Normal heart size. No evidence of adenopathy or pneumothorax. IMPRESSION: Stable COPD. Negative acute.     CT soft tissue neck 09/03/19: IMPRESSION: - No evidence residual or recurrent tumor. No specific clinical information was given. - Aortic Atherosclerosis (ICD10-I70.0) and Emphysema (ICD10-J43.9). 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 Because of stability since 2012, No no follow-up is recommended. Followup recommended(ref: J Am Coll Radiol. 2015 Feb;12(2): 143-50).  1V PCXR 08/18/19 (during SBO admission): FINDINGS: Previously noted nasogastric tube has been removed. Low lung volumes with bibasilar opacities which may reflect areas of atelectasis and/or consolidation. Small bilateral pleural effusions. No evidence of pulmonary edema. Heart size is normal. Upper mediastinal contours are within normal limits. IMPRESSION: 1. Low lung volumes with bibasilar areas of atelectasis and/or consolidation. Small bilateral pleural effusions.   EKG: 08/04/19: Sinus tachycardia at 105 bpm Septal infarct , age undetermined Abnormal ECG Confirmed by Pryor Curia [phone]) on 08/05/2019 10:09:33 AM - Baseline fine artifact is present, but overall, EKG appears stable when compared to 04/11/17 tracing.    CV: Normal nuclear stress test, EF 58% 01/22/05.   Past Medical History:  Diagnosis Date  . Bowel obstruction ([place]) 06/2009-8/Lonnie/2015 X 5; 2/Lonnie/2018  . COPD (chronic obstructive pulmonary disease) ([place])   . H/O hiatal hernia   .  History of stomach ulcers ~ 1960  . Hypoxia    idiopathic sleep related hypoxia  . Impaired fasting glucose   . On home oxygen therapy    "2L q hs" (2/Lonnie/2018)  . Oral cancer ([place])   . Osteoporosis   . Pneumonia 1990s X 1  . SBO (small bowel obstruction) ([place]) 04/21/2017  . Shortness of breath      Past Surgical History:  Procedure Laterality Date  . ABDOMINAL EXPLORATION SURGERY  06/2009; 10/2009  . ABDOMINAL SURGERY    . APPENDECTOMY  10/2009  . CHOLECYSTECTOMY N/A 09/06/2016   Procedure: LAPAROSCOPIC CHOLECYSTECTOMY WITH  INTRAOPERATIVE CHOLANGIOGRAM;  Surgeon: Ralene Ok, MD;  Location: [place];  Service: General;  Laterality: N/A;  . COLONOSCOPY    . ESOPHAGOGASTRODUODENOSCOPY     Dr Oletta Lamas   . GUM SURGERY    . GUM SURGERY     upper gum surgery, cancer   . HERNIA REPAIR     "naval"  . 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 UMBILICAL HERNIA REPAIR  06/2009   w/mesh    MEDICATIONS: . albuterol (PROVENTIL HFA;VENTOLIN HFA) 108 (90 BASE) MCG/ACT inhaler  . alendronate (FOSAMAX) 70 MG tablet  . aspirin EC 81 MG tablet  . clobetasol ointment (TEMOVATE) 0.05 %  . lactose free nutrition (BOOST) LIQD  . mupirocin cream (BACTROBAN) 2 %  . OXYGEN  . polyethylene glycol (MIRALAX / GLYCOLAX) 17 g packet  . tiotropium (SPIRIVA) 18 MCG inhalation capsule   No current facility-administered medications for this encounter.    Myra Gianotti, PA-C Surgical Short Stay/Anesthesiology [facility] Phone [phone] [facility] Phone [phone] 10/01/2019 12:14 PM

## 2019-10-04 ENCOUNTER — Encounter (HOSPITAL_COMMUNITY)
Admission: RE | Disposition: A | Discharge status: Home / Self Care | Payer: Self-pay | Source: Home / Self Care | Attending: Otolaryngology

## 2019-10-04 ENCOUNTER — Other Ambulatory Visit: Payer: Self-pay | Admitting: General Surgery

## 2019-10-04 ENCOUNTER — Inpatient Hospital Stay (HOSPITAL_COMMUNITY): Payer: Medicare Other | Admitting: Anesthesiology

## 2019-10-04 ENCOUNTER — Ambulatory Visit (HOSPITAL_COMMUNITY)
Admission: RE | Admit: 2019-10-04 | Discharge: 2019-10-04 | Disposition: A | Discharge status: Home / Self Care | Payer: Medicare Other | Attending: Otolaryngology | Admitting: Otolaryngology

## 2019-10-04 ENCOUNTER — Other Ambulatory Visit: Payer: Self-pay

## 2019-10-04 ENCOUNTER — Encounter (HOSPITAL_COMMUNITY): Payer: Self-pay | Admitting: Otolaryngology

## 2019-10-04 DIAGNOSIS — Z85818 Personal history of malignant neoplasm of other sites of lip, oral cavity, and pharynx: Secondary | ICD-10-CM | POA: Insufficient documentation

## 2019-10-04 DIAGNOSIS — Z9049 Acquired absence of other specified parts of digestive tract: Secondary | ICD-10-CM | POA: Insufficient documentation

## 2019-10-04 DIAGNOSIS — Z87891 Personal history of nicotine dependence: Secondary | ICD-10-CM | POA: Diagnosis not present

## 2019-10-04 DIAGNOSIS — Z7982 Long term (current) use of aspirin: Secondary | ICD-10-CM | POA: Insufficient documentation

## 2019-10-04 DIAGNOSIS — R000 Tachycardia, unspecified: Secondary | ICD-10-CM | POA: Diagnosis not present

## 2019-10-04 DIAGNOSIS — I252 Old myocardial infarction: Secondary | ICD-10-CM | POA: Diagnosis not present

## 2019-10-04 DIAGNOSIS — Z9981 Dependence on supplemental oxygen: Secondary | ICD-10-CM | POA: Diagnosis not present

## 2019-10-04 DIAGNOSIS — Z79899 Other long term (current) drug therapy: Secondary | ICD-10-CM | POA: Insufficient documentation

## 2019-10-04 DIAGNOSIS — C059 Malignant neoplasm of palate, unspecified: Secondary | ICD-10-CM | POA: Diagnosis not present

## 2019-10-04 DIAGNOSIS — I700 Atherosclerosis of aorta: Secondary | ICD-10-CM | POA: Insufficient documentation

## 2019-10-04 DIAGNOSIS — K1370 Unspecified lesions of oral mucosa: Secondary | ICD-10-CM | POA: Diagnosis present

## 2019-10-04 DIAGNOSIS — Z7951 Long term (current) use of inhaled steroids: Secondary | ICD-10-CM | POA: Diagnosis not present

## 2019-10-04 DIAGNOSIS — K449 Diaphragmatic hernia without obstruction or gangrene: Secondary | ICD-10-CM | POA: Diagnosis not present

## 2019-10-04 DIAGNOSIS — J439 Emphysema, unspecified: Secondary | ICD-10-CM | POA: Diagnosis not present

## 2019-10-04 HISTORY — PX: LESION REMOVAL: SHX5196

## 2019-10-04 SURGERY — WIDE EXCISION, LESION, UPPER EXTREMITY
Anesthesia: General | Site: Mouth | Laterality: Left

## 2019-10-04 MED ORDER — LIDOCAINE-EPINEPHRINE 1 %-1:100000 IJ SOLN
INTRAMUSCULAR | Status: AC
  Filled 2019-10-04: qty 1

## 2019-10-04 MED ORDER — ONDANSETRON HCL 4 MG/2ML IJ SOLN
INTRAMUSCULAR | Status: AC
  Filled 2019-10-04: qty 2

## 2019-10-04 MED ORDER — LIDOCAINE 2% (20 MG/ML) 5 ML SYRINGE
INTRAMUSCULAR | Status: AC
  Filled 2019-10-04: qty 5

## 2019-10-04 MED ORDER — LIDOCAINE-EPINEPHRINE 1 %-1:100000 IJ SOLN
INTRAMUSCULAR | Status: DC | PRN
  Administered 2019-10-04: 2 mL

## 2019-10-04 MED ORDER — LIDOCAINE 2% (20 MG/ML) 5 ML SYRINGE
INTRAMUSCULAR | Status: DC | PRN
  Administered 2019-10-04: 60 mg via INTRAVENOUS

## 2019-10-04 MED ORDER — PHENYLEPHRINE 40 MCG/ML (10ML) SYRINGE FOR IV PUSH (FOR BLOOD PRESSURE SUPPORT)
PREFILLED_SYRINGE | INTRAVENOUS | Status: AC
  Filled 2019-10-04: qty 10

## 2019-10-04 MED ORDER — DEXAMETHASONE SODIUM PHOSPHATE 10 MG/ML IJ SOLN
INTRAMUSCULAR | Status: DC | PRN
  Administered 2019-10-04: 10 mg via INTRAVENOUS

## 2019-10-04 MED ORDER — BACITRACIN ZINC 500 UNIT/GM EX OINT
TOPICAL_OINTMENT | CUTANEOUS | Status: AC
  Filled 2019-10-04: qty 28.35

## 2019-10-04 MED ORDER — FENTANYL CITRATE (PF) 100 MCG/2ML IJ SOLN: 25.0000 ug | INTRAMUSCULAR | Status: DC | PRN

## 2019-10-04 MED ORDER — CHLORHEXIDINE GLUCONATE 0.12 % MT SOLN
15.0000 mL | Freq: Once | OROMUCOSAL | Status: DC
  Filled 2019-10-04: qty 15

## 2019-10-04 MED ORDER — ACETAMINOPHEN 500 MG PO TABS
1000.0000 mg | ORAL_TABLET | Freq: Once | ORAL | Status: AC
  Administered 2019-10-04: 1000 mg via ORAL
  Filled 2019-10-04: qty 2

## 2019-10-04 MED ORDER — ONDANSETRON HCL 4 MG/2ML IJ SOLN
INTRAMUSCULAR | Status: DC | PRN
  Administered 2019-10-04: 4 mg via INTRAVENOUS

## 2019-10-04 MED ORDER — SUGAMMADEX SODIUM 200 MG/2ML IV SOLN
INTRAVENOUS | Status: DC | PRN
  Administered 2019-10-04: 300 mg via INTRAVENOUS

## 2019-10-04 MED ORDER — PROMETHAZINE HCL 25 MG RE SUPP: 25.0000 mg | Freq: Four times a day (QID) | RECTAL | 1 refills | Status: DC | PRN

## 2019-10-04 MED ORDER — ROCURONIUM BROMIDE 10 MG/ML (PF) SYRINGE
PREFILLED_SYRINGE | INTRAVENOUS | Status: AC
  Filled 2019-10-04: qty 10

## 2019-10-04 MED ORDER — LACTATED RINGERS IV SOLN: INTRAVENOUS | Status: DC

## 2019-10-04 MED ORDER — FENTANYL CITRATE (PF) 250 MCG/5ML IJ SOLN
INTRAMUSCULAR | Status: AC
  Filled 2019-10-04: qty 5

## 2019-10-04 MED ORDER — ESMOLOL HCL 100 MG/10ML IV SOLN
INTRAVENOUS | Status: AC
  Filled 2019-10-04: qty 10

## 2019-10-04 MED ORDER — ESMOLOL HCL 100 MG/10ML IV SOLN
INTRAVENOUS | Status: DC | PRN
  Administered 2019-10-04: 20 mg via INTRAVENOUS

## 2019-10-04 MED ORDER — ROCURONIUM BROMIDE 10 MG/ML (PF) SYRINGE
PREFILLED_SYRINGE | INTRAVENOUS | Status: DC | PRN
  Administered 2019-10-04: 50 mg via INTRAVENOUS

## 2019-10-04 MED ORDER — 0.9 % SODIUM CHLORIDE (POUR BTL) OPTIME
TOPICAL | Status: DC | PRN
  Administered 2019-10-04: 1000 mL

## 2019-10-04 MED ORDER — FENTANYL CITRATE (PF) 250 MCG/5ML IJ SOLN
INTRAMUSCULAR | Status: DC | PRN
Start: 2019-10-04 — End: 2019-10-04
  Administered 2019-10-04: 100 ug via INTRAVENOUS

## 2019-10-04 MED ORDER — DEXAMETHASONE SODIUM PHOSPHATE 10 MG/ML IJ SOLN
INTRAMUSCULAR | Status: AC
  Filled 2019-10-04: qty 1

## 2019-10-04 MED ORDER — PROPOFOL 10 MG/ML IV BOLUS
INTRAVENOUS | Status: DC | PRN
  Administered 2019-10-04: 100 mg via INTRAVENOUS

## 2019-10-04 MED ORDER — ORAL CARE MOUTH RINSE: 15.0000 mL | Freq: Once | OROMUCOSAL | Status: DC

## 2019-10-04 MED ORDER — PROPOFOL 10 MG/ML IV BOLUS
INTRAVENOUS | Status: AC
  Filled 2019-10-04: qty 20

## 2019-10-04 MED ORDER — HYDROCODONE-ACETAMINOPHEN 7.5-325 MG PO TABS
1.0000 | ORAL_TABLET | Freq: Four times a day (QID) | ORAL | 0 refills | Status: DC | PRN
Start: 2019-10-04 — End: 2021-09-18

## 2019-10-04 MED ORDER — LACTATED RINGERS IV SOLN: INTRAVENOUS | Status: DC | PRN

## 2019-10-04 SURGICAL SUPPLY — 50 items
APPLIER CLIP 9.375 SM OPEN (CLIP)
BLADE SURG 15 STRL LF DISP TIS (BLADE) IMPLANT
BLADE SURG 15 STRL SS (BLADE)
CANISTER SUCT 3000ML PPV (MISCELLANEOUS) ×2 IMPLANT
CLEANER TIP ELECTROSURG 2X2 (MISCELLANEOUS) ×2 IMPLANT
CLIP APPLIE 9.375 SM OPEN (CLIP) IMPLANT
CNTNR URN SCR LID CUP LEK RST (MISCELLANEOUS) IMPLANT
CONT SPEC 4OZ STRL OR WHT (MISCELLANEOUS)
CORD BIPOLAR FORCEPS 12FT (ELECTRODE) ×2 IMPLANT
COVER SURGICAL LIGHT HANDLE (MISCELLANEOUS) ×2 IMPLANT
COVER WAND RF STERILE (DRAPES) ×2 IMPLANT
DRAIN CHANNEL 15F RND FF W/TCR (WOUND CARE) IMPLANT
DRAIN SNY 10 ROU (WOUND CARE) IMPLANT
DRAPE HALF SHEET 40X57 (DRAPES) IMPLANT
DRAPE INCISE 23X17 IOBAN STRL (DRAPES)
DRAPE INCISE IOBAN 23X17 STRL (DRAPES) IMPLANT
ELECT COATED BLADE 2.86 ST (ELECTRODE) ×2 IMPLANT
ELECT REM PT RETURN 9FT ADLT (ELECTROSURGICAL) ×2
ELECTRODE REM PT RTRN 9FT ADLT (ELECTROSURGICAL) ×1 IMPLANT
EVACUATOR SILICONE 100CC (DRAIN) IMPLANT
FORCEPS BIPOLAR SPETZLER 8 1.0 (NEUROSURGERY SUPPLIES) IMPLANT
GAUZE 4X4 16PLY RFD (DISPOSABLE) IMPLANT
GLOVE ECLIPSE 7.5 STRL STRAW (GLOVE) ×2 IMPLANT
GOWN STRL REUS W/ TWL LRG LVL3 (GOWN DISPOSABLE) ×2 IMPLANT
GOWN STRL REUS W/TWL LRG LVL3 (GOWN DISPOSABLE) ×4
KIT BASIN OR (CUSTOM PROCEDURE TRAY) ×2 IMPLANT
KIT TURNOVER KIT B (KITS) ×2 IMPLANT
LOCATOR NERVE 3 VOLT (DISPOSABLE) IMPLANT
NEEDLE PRECISIONGLIDE 27X1.5 (NEEDLE) ×2 IMPLANT
NS IRRIG 1000ML POUR BTL (IV SOLUTION) ×2 IMPLANT
PAD ARMBOARD 7.5X6 YLW CONV (MISCELLANEOUS) IMPLANT
PENCIL FOOT CONTROL (ELECTRODE) ×2 IMPLANT
PUNCH BIOPSY DERMAL 6MM STRL (MISCELLANEOUS) ×2 IMPLANT
SPECIMEN JAR MEDIUM (MISCELLANEOUS) IMPLANT
SPONGE INTESTINAL PEANUT (DISPOSABLE) IMPLANT
SPONGE LAP 18X18 RF (DISPOSABLE) IMPLANT
STAPLER VISISTAT 35W (STAPLE) IMPLANT
SUT CHROMIC 3 0 SH 27 (SUTURE) IMPLANT
SUT CHROMIC 5 0 P 3 (SUTURE) IMPLANT
SUT ETHILON 3 0 PS 1 (SUTURE) IMPLANT
SUT ETHILON 5 0 PS 2 18 (SUTURE) IMPLANT
SUT SILK 2 0 REEL (SUTURE) IMPLANT
SUT SILK 3 0 SH CR/8 (SUTURE) IMPLANT
SUT SILK 4 0 REEL (SUTURE) IMPLANT
SUT VIC AB 3-0 SH 18 (SUTURE) IMPLANT
TOWEL GREEN STERILE FF (TOWEL DISPOSABLE) ×2 IMPLANT
TRAY ENT MC OR (CUSTOM PROCEDURE TRAY) ×2 IMPLANT
TRAY FOLEY MTR SLVR 14FR STAT (SET/KITS/TRAYS/PACK) IMPLANT
TUBE FEEDING 10FR FLEXIFLO (MISCELLANEOUS) IMPLANT
WATER STERILE IRR 1000ML POUR (IV SOLUTION) IMPLANT

## 2019-10-04 NOTE — Transfer of Care (Signed)
Immediate Anesthesia Transfer of Care Note  Patient: [name]  Procedure(s) Performed: ORAL LESION REMOVAL W/BIOPSY (N/A )  Patient Location: PACU  Anesthesia Type:General  Level of Consciousness: awake, alert  and patient cooperative  Airway & Oxygen Therapy: Patient Spontanous Breathing and Patient connected to face mask oxygen  Post-op Assessment: Report given to RN and Post -op Vital signs reviewed and stable  Post vital signs: Reviewed and stable  Last Vitals:  Vitals Value Taken Time  BP 123/69 10/04/19 0812  Temp 36.6 C 10/04/19 0812  Pulse 84 10/04/19 0816  Resp 18 10/04/19 0816  SpO2 92 % 10/04/19 0816  Vitals shown include unvalidated device data.  Last Pain:  Vitals:   10/04/19 0812  TempSrc:   PainSc: (P) 0-No pain      Patients Stated Pain Goal: 3 (59/09/31 1216)  Complications: No complications documented.

## 2019-10-04 NOTE — Op Note (Signed)
OPERATIVE REPORT  DATE OF SURGERY: 10/04/2019  PATIENT:  Lonnie Schaefer,  79 y.o. male  PRE-OPERATIVE DIAGNOSIS:  oral lesion, history of cancer of hard palat  POST-OPERATIVE DIAGNOSIS:  * No post-op diagnosis entered *  PROCEDURE:  Procedure(s): ORAL LESION REMOVAL W/BIOPSY  SURGEON:  Beckie Salts, MD  ASSISTANTS: None  ANESTHESIA:   General   EBL: 20 ml  DRAINS: None  LOCAL MEDICATIONS USED: 1% Xylocaine with epinephrine  SPECIMEN: Biopsy left hard/soft palate junction  COUNTS:  Correct  PROCEDURE DETAILS: The patient was taken to the operating room and placed on the operating table in the supine position. Following induction of general endotracheal anesthesia, the table was turned 90 degrees and the patient was draped in a standard fashion.  A Crowe-Davis mouthgag was inserted the oral cavity used to retract the tongue and mandible.  The palate lesion was identified and infiltrated with local anesthetic solution.  A 6 mm dermal punch was used in 2 places to remove the majority of the superficial ulceration which measured approximately 1-1/2 cm x 1 cm and was oval in shape.  2 biopsies were sent for pathological evaluation.  Electrocautery was used to complete hemostasis of the biopsy sites.  No other findings were noted.  Mouthgag was released.  Patient was awakened extubated and transferred to recovery in stable condition.    PATIENT DISPOSITION:  To PACU, stable

## 2019-10-04 NOTE — Interval H&P Note (Signed)
History and Physical Interval Note:  10/04/2019 7:22 AM  Lonnie Schaefer  has presented today for surgery, with the diagnosis of oral lesion, history of cancer of hard palat.  The various methods of treatment have been discussed with the patient and family. After consideration of risks, benefits and other options for treatment, the patient has consented to  Procedure(s): ORAL LESION REMOVAL W/BIOPSY (N/A) as a surgical intervention.  The patient's history has been reviewed, patient examined, no change in status, stable for surgery.  I have reviewed the patient's chart and labs.  Questions were answered to the patient's satisfaction.     [name]

## 2019-10-04 NOTE — Discharge Instructions (Signed)
Rinse mouth with salt water several times daily.  Eat what you are able to eat.  Use Tylenol/Motrin for pain or use of Motrin and the prescription pain medicine.

## 2019-10-04 NOTE — Anesthesia Procedure Notes (Signed)
Procedure Name: Intubation Date/Time: 10/04/2019 7:42 AM Performed by: Renato Shin, CRNA Pre-anesthesia Checklist: Patient identified, Emergency Drugs available, Suction available and Patient being monitored Patient Re-evaluated:Patient Re-evaluated prior to induction Oxygen Delivery Method: Circle system utilized Preoxygenation: Pre-oxygenation with 100% oxygen Induction Type: IV induction Ventilation: Mask ventilation without difficulty Laryngoscope Size: Miller and 2 Grade View: Grade I Tube type: Oral Tube size: 7.0 mm Number of attempts: 1 Airway Equipment and Method: Stylet and Oral airway Placement Confirmation: ETT inserted through vocal cords under direct vision,  positive ETCO2 and breath sounds checked- equal and bilateral Secured at: 21 cm Tube secured with: Tape Dental Injury: Teeth and Oropharynx as per pre-operative assessment

## 2019-10-04 NOTE — Anesthesia Postprocedure Evaluation (Signed)
Anesthesia Post Note  Patient: Lonnie Schaefer  Procedure(s) Performed: ORAL LESION REMOVAL W/BIOPSY (Left Mouth)     Patient location during evaluation: PACU Anesthesia Type: General Level of consciousness: awake and alert Pain management: pain level controlled Vital Signs Assessment: post-procedure vital signs reviewed and stable Respiratory status: spontaneous breathing, nonlabored ventilation, respiratory function stable and patient connected to nasal cannula oxygen Cardiovascular status: blood pressure returned to baseline and stable Postop Assessment: no apparent nausea or vomiting Anesthetic complications: no   No complications documented.  Last Vitals:  Vitals:   10/04/19 0827 10/04/19 0835  BP: 122/71 125/70  Pulse: 71 72  Resp: 13 16  Temp:  36.6 C  SpO2: 94% 94%    Last Pain:  Vitals:   10/04/19 0835  TempSrc:   PainSc: 0-No pain                 Tyaisha Cullom L Vondra Aldredge

## 2019-10-05 ENCOUNTER — Encounter (HOSPITAL_COMMUNITY): Payer: Self-pay | Admitting: Otolaryngology

## 2019-10-18 ENCOUNTER — Encounter (HOSPITAL_COMMUNITY): Payer: Self-pay | Admitting: Otolaryngology

## 2019-10-21 NOTE — Progress Notes (Signed)
Oncology Nurse Navigator Documentation  Placed introductory call to new referral patient Lonnie Schaefer  Introduced myself as the H&N oncology nurse navigator that works with Dr. Isidore Moos to whom he has been referred by Dr. Constance Holster. He confirmed understanding of referral.  Briefly explained my role as his navigator, provided my contact information.   Confirmed understanding of upcoming appts and [place] location, explained arrival and registration process.  I explained the purpose of a dental evaluation prior to starting RT, indicated he wd be contacted by [facility] DM to arrange an appt.    I encouraged him to call with questions/concerns as he moves forward with appts and procedures.    He verbalized understanding of information provided, expressed appreciation for my call.   Navigator Initial Assessment . Employment Status: he is retired . Currently on FMLA / STD: na . Living Situation: He lives with his wife, Lonnie Schaefer . Support System: his wife and two daughters . PCP: Lonnie Schaefer [facility]) . PCD: none . Financial Concerns: no . Transportation Needs: no . Sensory Deficits: no . Language Barriers/Interpreter Needed:  no . Ambulation Needs: no . DME Used in Home: no . Psychosocial Needs:  no . Concerns/Needs Understanding Cancer:  addressed/answered by navigator to best of ability . Self-Expressed Needs: no   Harlow Asa RN, BSN, OCN Head & Neck Oncology Nurse [place] [facility] Phone # [phone]  Fax # [phone]

## 2019-10-25 NOTE — Progress Notes (Signed)
Radiation Oncology         ([phone]) [phone] ________________________________  Initial outpatient Consultation  Name: Lonnie Schaefer MRN: [id number]  Date: 10/26/2019  DOB: 06-26-1940  CC:[name], FNP  [name], MD   REFERRING PHYSICIAN: [name], MD  DIAGNOSIS: C05.0    ICD-10-CM   1. Malignant neoplasm of junction of hard and soft palate (HCC)  C05.9   2. 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    Cancer Staging Cancer of junction of hard and soft palate ([place]) Staging form: Oral Cavity, AJCC 8th Edition - Clinical stage from 10/26/2019: Stage I (cT1, cN0, cM0) - Signed by Eppie Gibson, MD on 10/27/2019  Carcinoma of hard palate ([place]) - No stage assigned - Unsigned   CHIEF COMPLAINT: Here to discuss management of hard palate cancer    HISTORY OF PRESENT ILLNESS:Lonnie Schaefer is a 79 y.o. male who presented with three-month history of progressively worsening sore in the roof of his mouth. Subsequently, the patient saw Dr. Constance Holster who performed an oral lesion partial removal and biopsy on 10/04/2019. 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   Pertinent imaging thus far includes soft tissue neck CT scan performed on 09/03/2019 that did not show any evidence of tumor.  I personally reviewed those images.  Last month, he also underwent a chest x-ray, 2 views, without any evidence of metastatic disease.  He has not had a CT scan of his chest recently.  The patient has not smoked for 20 years.  He had smoked 2 packs a day for 50 years; he denies chewing tobacco or electronic cigarettes.  He denies any alcohol use.  He reports that he has tingling in the left palate related to hot or cold temperature exposure.  Swallowing issues, if any: None  Weight Changes: None  Prior cancers, if any: Cancer of hard palate 7+ years ago that was treated without radiation.  Prior oral surgery was to same  area per pt - by Dr Janace Hoard.   Patient sees dentistry today.  PREVIOUS RADIATION THERAPY: No  PAST MEDICAL HISTORY:  has a past medical history of Bowel obstruction ([place]) (06/2009-10/01/2013 X 5; 04/03/2016), COPD (chronic obstructive pulmonary disease) ([place]), H/O hiatal hernia, History of stomach ulcers (~ 1960), Hypoxia, Impaired fasting glucose, On home oxygen therapy, Oral cancer ([place]), Osteoporosis, Pneumonia (1990s X 1), SBO (small bowel obstruction) ([place]) (04/21/2017), and Shortness of breath.    PAST SURGICAL HISTORY: Past Surgical History:  Procedure Laterality Date  . ABDOMINAL EXPLORATION SURGERY  06/2009; 10/2009  . ABDOMINAL SURGERY    . APPENDECTOMY  10/2009  . CHOLECYSTECTOMY N/A 09/06/2016   Procedure: LAPAROSCOPIC CHOLECYSTECTOMY WITH  INTRAOPERATIVE CHOLANGIOGRAM;  Surgeon: Ralene Ok, MD;  Location: [place];  Service: General;  Laterality: N/A;  . COLONOSCOPY    . ESOPHAGOGASTRODUODENOSCOPY     Dr Oletta Lamas   . GUM SURGERY    . GUM SURGERY     upper gum surgery, cancer   . HERNIA REPAIR     "naval"  . LESION REMOVAL Left 10/04/2019   Procedure: ORAL LESION REMOVAL W/BIOPSY;  Surgeon: [name], MD;  Location: [place];  Service: ENT;  Laterality: Left;  . 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 UMBILICAL HERNIA REPAIR  06/2009   w/mesh    FAMILY HISTORY: family history includes Pneumonia in his mother.  SOCIAL HISTORY:  reports that he quit  smoking about 20 years ago. His smoking use included cigarettes. He has a 100.00 pack-year smoking history. He has never used smokeless tobacco. He reports previous alcohol use. He reports that he does not use drugs.  ALLERGIES: Patient has no allergy information on record.  MEDICATIONS:  Current Outpatient Medications  Medication Sig Dispense Refill  . alendronate (FOSAMAX) 70 MG tablet Take 70 mg by mouth every Wednesday.  0  . aspirin EC 81 MG tablet Take 81 mg by mouth daily. Swallow whole.    .  lactose free nutrition (BOOST) LIQD Take 237 mLs by mouth daily.    . OXYGEN Inhale 2 L into the lungs at bedtime.    . polyethylene glycol (MIRALAX / GLYCOLAX) 17 g packet Take 17 g by mouth 2 (two) times daily. (Patient taking differently: Take 17 g by mouth daily with lunch. ) 14 each 0  . tiotropium (SPIRIVA) 18 MCG inhalation capsule Place 18 mcg into inhaler and inhale daily.    Marland Kitchen albuterol (PROVENTIL HFA;VENTOLIN HFA) 108 (90 BASE) MCG/ACT inhaler Inhale 1 puff into the lungs every 6 (six) hours as needed for wheezing.  (Patient not taking: Reported on 10/26/2019)    . clobetasol ointment (TEMOVATE) 0.53 % Apply 1 application topically 2 (two) times daily as needed (rash). (Patient not taking: Reported on 10/26/2019)    . HYDROcodone-acetaminophen (NORCO) 7.5-325 MG tablet Take 1 tablet by mouth every 6 (six) hours as needed for moderate pain. (Patient not taking: Reported on 10/26/2019) 20 tablet 0  . mupirocin cream (BACTROBAN) 2 % Apply topically 2 (two) times daily. Apply to rash on the back (Patient not taking: Reported on 10/26/2019) 15 g 0   No current facility-administered medications for this encounter.    REVIEW OF SYSTEMS:  Notable for that above.   PHYSICAL EXAM:  height is 5\' 10"  (1.778 m) and weight is 160 lb 3.2 oz (72.7 kg). His temperature is 98.1 F (36.7 C). His blood pressure is 120/53 (abnormal) and his pulse is 67. His respiration is 18 and oxygen saturation is 98%.   General: Alert and oriented, in no acute distress HEENT: Head is normocephalic. Extraocular movements are intact. Oropharynx is notable for healing mucosa at the site of the biopsy at the junction of the left lateral hard and soft palates; see photo below.  He is edentulous. Neck: Neck is notable for no palpable adenopathy in the periauricular, submandibular, submental, nor remaining cervical and supraclavicular regions Heart: Regular in rate and rhythm with no murmurs, rubs, or gallops. Chest: Clear to  auscultation bilaterally, with no rhonchi, wheezes, or rales. Extremities: No cyanosis or edema. Lymphatics: see Neck Exam Skin: No concerning lesions. Musculoskeletal: symmetric strength and muscle tone throughout. Neurologic: Cranial nerves II through XII are grossly intact. No obvious focalities. Speech is fluent. Coordination is intact. Psychiatric: Judgment and insight are intact. Affect is appropriate.    ECOG = 0  0 - Asymptomatic (Fully active, able to carry on all predisease activities without restriction)  1 - Symptomatic but completely ambulatory (Restricted in physically strenuous activity but ambulatory and able to carry out work of a light or sedentary nature. For example, light housework, office work)  2 - Symptomatic, <50% in bed during the day (Ambulatory and capable of all self care but unable to carry out any work activities. Up and about more than 50% of waking hours)  3 - Symptomatic, >50% in bed, but not bedbound (Capable of only limited self-care, confined to bed or chair  50% or more of waking hours)  4 - Bedbound (Completely disabled. Cannot carry on any self-care. Totally confined to bed or chair)  5 - Death   Eustace Pen MM, Creech RH, Tormey DC, et al. [phone]). "Toxicity and response criteria of the [facility]". [place] Oncol. 5 (6): 649-55   LABORATORY DATA:  Lab Results  Component Value Date   WBC 9.7 09/30/2019   HGB 13.0 09/30/2019   HCT 40.3 09/30/2019   MCV 97.3 09/30/2019   PLT 316 09/30/2019   CMP     Component Value Date/Time   NA 140 08/09/2019 0220   K 4.2 08/09/2019 0220   CL 105 08/09/2019 0220   CO2 28 08/09/2019 0220   GLUCOSE 100 (H) 08/09/2019 0220   BUN 8 08/09/2019 0220   CREATININE 0.82 08/09/2019 0220   CALCIUM 8.6 (L) 08/09/2019 0220   PROT 5.2 (L) 08/09/2019 0220   ALBUMIN 2.6 (L) 08/09/2019 0220   AST 18 08/09/2019 0220   ALT 18 08/09/2019 0220   ALKPHOS 36 (L) 08/09/2019 0220   BILITOT 1.2  08/09/2019 0220   GFRNONAA >60 08/09/2019 0220   GFRAA >60 08/09/2019 0220      No results found for: TSH   RADIOGRAPHY: No results found.    IMPRESSION/PLAN:  This is a delightful patient with hard palate cancer.  I spoke w/ Dr. Constance Holster who performed a generous biopsy of this 3 weeks ago. He has concerns that VPI/swallowing/speech issues would ensue with complete resection.  Therefore, the patient was referred to me for consideration of definitive radiation therapy.  I spoke with the patient about his options.  He understands that he could be sent to a [facility] for consideration of definitive surgery.  He understands that according to my discussion with Dr. Constance Holster, this would probably require reconstructive surgery after resection. He understands that despite surgery, he might need adjuvant radiotherapy.  However, arguably, the most aggressive option with the best chance of cure would be surgery plus or minus adjuvant radiotherapy.  A less aggressive option that still holds a favorable cure rate would be a definitive radiation therapy.  This would probably result in superior swallowing and speech function. The patient is very interested in reducing potential morbidity from treatment and would like to pursue definitive radiation therapy.  We discussed the potential risks, benefits, and side effects of radiotherapy. We talked in detail about acute and late effects. We discussed that some of the most bothersome acute effects may be mucositis, dysgeusia, salivary changes, skin irritation, hair loss, dehydration, weight loss and fatigue. We talked about late effects which include but are not necessarily limited to dysphagia, hypothyroidism, nerve or vascular injury, spinal cord injury, xerostomia, trismus, and neck edema. No guarantees of treatment were given. A consent form was signed and placed in the patient's medical record. The patient is enthusiastic about proceeding with treatment. I look forward to  participating in the patient's care.    As part of our discussion we discussed different fractionation methods.  We discussed a hypofractionated method over 4 weeks versus standard fractionation over 7 weeks.  He understands that standard fractionation has been tested and validated more in clinical trials.  However, it probably carries a higher risk of acute side effects and a higher risk of weight loss and the need for a feeding tube.  The patient would like to proceed with a 4-week regimen of hypofractionation and understands the pros and cons of this approach.  Simulation (treatment  planning) will take place in the near future.  We also discussed that the treatment of head and neck cancer is a multidisciplinary process to maximize treatment outcomes and quality of life. For this reason the following referrals have been or will be made:    Dentistry for dental evaluation, possible extractions in the radiation fields, and /or advice on reducing risk of cavities, osteoradionecrosis, or other oral issues.   Nutritionist for nutrition support during and after treatment.   Speech language pathology for swallowing and/or speech therapy.   Social work for social support.    Physical therapy due to risk of lymphedema in neck and deconditioning.   Baseline labs including TSH.  I have also asked our navigator to facilitate p16 testing on his tissue.  CT scan of chest without contrast to rule out metastatic disease or second primaries in the lung, given his smoking history.  On date of service, in total, I spent 60 minutes on this encounter. Patient was seen in person.  __________________________________________   Eppie Gibson, MD  This document serves as a record of services personally performed by Eppie Gibson, MD. It was created on his behalf by Clerance Lav, a trained medical scribe. The creation of this record is based on the scribe's personal observations and the provider's statements to  them. This document has been checked and approved by the attending provider.

## 2019-10-25 NOTE — Progress Notes (Signed)
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 It progressively got worse. History of cancer of the hard palate treated more than 7 years ago (surgery without radiation per Dr. Constance Holster)   Biopsies revealed:  10/04/2019 FINAL MICROSCOPIC DIAGNOSIS:  A. HARD PALATE/SOFT PALATE JUNCTION, LEFT, BIOPSY:  - Invasive poorly differentiated squamous cell carcinoma.  Nutrition Status Yes No Comments  Weight changes? []  [x]    Swallowing concerns? []  [x]    PEG? []  [x]     Referrals Yes No Comments  Social Work? [x]  []    Dentistry? [x]  []  Scheduled for consult with Dr. Teena Dunk 10/26/2019  Swallowing therapy? [x]  []    Nutrition? [x]  []    Med/Onc? []  [x]     Safety Issues Yes No Comments  Prior radiation? []  [x]    Pacemaker/ICD? []  [x]    Possible current pregnancy? []  [x]  N/A  Is the patient on methotrexate? []  [x]     Tobacco/Marijuana/Snuff/ETOH use:  Former smoker--quit 2001 (used to smoke 2 packs/day for 50 years). Denies any current alcohol use. Denies any illicit drug use.  Past/Anticipated interventions by otolaryngology, if any:  10/04/2019 Dr. [name] ORAL LESION REMOVAL W/BIOPSY  Past/Anticipated interventions by medical oncology, if any:  No referral scheduled at this time   Current Complaints / other details:  Nothing of note

## 2019-10-26 ENCOUNTER — Telehealth: Payer: Self-pay | Admitting: *Deleted

## 2019-10-26 ENCOUNTER — Ambulatory Visit
Admission: RE | Admit: 2019-10-26 | Discharge: 2019-10-26 | Disposition: A | Discharge status: Home / Self Care | Payer: Medicare Other | Source: Ambulatory Visit | Attending: Radiation Oncology | Admitting: Radiation Oncology

## 2019-10-26 ENCOUNTER — Encounter (HOSPITAL_COMMUNITY): Payer: Self-pay | Admitting: Dentistry

## 2019-10-26 ENCOUNTER — Other Ambulatory Visit: Payer: Self-pay

## 2019-10-26 ENCOUNTER — Encounter: Payer: Self-pay | Admitting: Radiation Oncology

## 2019-10-26 ENCOUNTER — Ambulatory Visit (HOSPITAL_COMMUNITY): Payer: Self-pay | Admitting: Dentistry

## 2019-10-26 VITALS — BP 140/76 | HR 60 | Temp 97.9°F

## 2019-10-26 VITALS — BP 120/53 | HR 67 | Temp 98.1°F | Resp 18 | Ht 70.0 in | Wt 160.2 lb

## 2019-10-26 DIAGNOSIS — Z87891 Personal history of nicotine dependence: Secondary | ICD-10-CM | POA: Insufficient documentation

## 2019-10-26 DIAGNOSIS — Z7982 Long term (current) use of aspirin: Secondary | ICD-10-CM | POA: Diagnosis not present

## 2019-10-26 DIAGNOSIS — C058 Malignant neoplasm of overlapping sites of palate: Secondary | ICD-10-CM | POA: Diagnosis present

## 2019-10-26 DIAGNOSIS — C059 Malignant neoplasm of palate, unspecified: Secondary | ICD-10-CM | POA: Insufficient documentation

## 2019-10-26 DIAGNOSIS — M810 Age-related osteoporosis without current pathological fracture: Secondary | ICD-10-CM | POA: Insufficient documentation

## 2019-10-26 DIAGNOSIS — K0889 Other specified disorders of teeth and supporting structures: Secondary | ICD-10-CM

## 2019-10-26 DIAGNOSIS — C050 Malignant neoplasm of hard palate: Secondary | ICD-10-CM

## 2019-10-26 DIAGNOSIS — Z01818 Encounter for other preprocedural examination: Secondary | ICD-10-CM | POA: Diagnosis not present

## 2019-10-26 DIAGNOSIS — K56609 Unspecified intestinal obstruction, unspecified as to partial versus complete obstruction: Secondary | ICD-10-CM | POA: Insufficient documentation

## 2019-10-26 DIAGNOSIS — J449 Chronic obstructive pulmonary disease, unspecified: Secondary | ICD-10-CM | POA: Diagnosis not present

## 2019-10-26 DIAGNOSIS — K0820 Unspecified atrophy of edentulous alveolar ridge: Secondary | ICD-10-CM

## 2019-10-26 DIAGNOSIS — Z972 Presence of dental prosthetic device (complete) (partial): Secondary | ICD-10-CM

## 2019-10-26 DIAGNOSIS — K08109 Complete loss of teeth, unspecified cause, unspecified class: Secondary | ICD-10-CM

## 2019-10-26 NOTE — Progress Notes (Signed)
Oncology Nurse Navigator Documentation  At Dr. Pearlie Oyster request, I called Pathology and spoke to [facility]. I requested P16 results be added to his biopsy report from 10/04/19. She verbalized that she would put the request in.  Harlow Asa RN, BSN, OCN Head & Neck Oncology Nurse [place] at [facility] Phone # [phone]  Fax # [phone]

## 2019-10-26 NOTE — Progress Notes (Signed)
DENTAL CONSULTATION  Date of Consultation:  10/26/2019 Patient Name:   Lonnie Schaefer Date of Birth:   [birth year] Medical Record Number: [id number]  COVID 19 SCREENING: The patient does not symptoms concerning for COVID-19 infection (Including fever, chills, cough, or new SHORTNESS OF BREATH).    VITALS: BP 140/76 (BP Location: Right Arm)   Pulse 60   Temp 97.9 F (36.6 C)   CHIEF COMPLAINT: Patient referred by Dr. Isidore Moos for a dental consultation.  HPI: Lonnie Schaefer is a 79 year old male recently diagnosed with carcinoma of the junction of the hard and soft palate.  Patient with anticipated radiation therapy and possible chemotherapy.  Patient is now seen as part of a prechemoradiation therapy dental protocol examination.  Patient currently denies acute toothaches, swellings, or abscesses.  Patient indicates that he is edentulous.  Patient has been edentulous for many years by report.  Patient has had 3 sets of upper and lower complete dentures.  The last dentures were made approximately 10 years ago at [facility].  Patient no longer wears the dentures.  Patient denies having dental phobia.  PROBLEM LIST: Patient Active Problem List   Diagnosis Date Noted  . Cancer of junction of hard and soft palate ([place]) 10/26/2019    Priority: High  . Carcinoma of hard palate ([place]) 10/26/2019  . Supplemental oxygen dependent 08/09/2019  . Lactic acidosis 08/05/2019  . 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[place]) 08/05/2019  . Acute abdominal pain   . Acute cholecystitis 09/05/2016  . Leukocytosis 04/03/2016  . SBO (small bowel obstruction) ([place]) 04/03/2016  . Hyperbilirubinemia 04/03/2016  . AKI (acute kidney injury) ([place]) 04/03/2016  . Nausea, vomiting and diarrhea 10/01/2013  . COPD (chronic obstructive pulmonary disease) ([place]) 02/23/2007    PMH: Past Medical History:  Diagnosis Date  . Bowel obstruction ([place]) 06/2009-10/01/2013 X 5; 04/03/2016  . COPD (chronic obstructive pulmonary  disease) ([place])   . H/O hiatal hernia   . History of stomach ulcers ~ 1960  . Hypoxia    idiopathic sleep related hypoxia  . Impaired fasting glucose   . On home oxygen therapy    "2L q hs" (04/03/2016)  . Oral cancer ([place])   . Osteoporosis   . Pneumonia 1990s X 1  . SBO (small bowel obstruction) ([place]) 04/21/2017  . Shortness of breath     PSH: Past Surgical History:  Procedure Laterality Date  . ABDOMINAL EXPLORATION SURGERY  06/2009; 10/2009  . ABDOMINAL SURGERY    . APPENDECTOMY  10/2009  . CHOLECYSTECTOMY N/A 09/06/2016   Procedure: LAPAROSCOPIC CHOLECYSTECTOMY WITH  INTRAOPERATIVE CHOLANGIOGRAM;  Surgeon: Ralene Ok, MD;  Location: [place];  Service: General;  Laterality: N/A;  . COLONOSCOPY    . ESOPHAGOGASTRODUODENOSCOPY     Dr Oletta Lamas   . GUM SURGERY    . GUM SURGERY     upper gum surgery, cancer   . HERNIA REPAIR     "[facility]"  . LESION REMOVAL Left 10/04/2019   Procedure: ORAL LESION REMOVAL W/BIOPSY;  Surgeon: [name], MD;  Location: [place];  Service: ENT;  Laterality: Left;  . 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 UMBILICAL HERNIA REPAIR  06/2009   w/mesh    ALLERGIES: No Known Allergies  MEDICATIONS: Current Outpatient Medications  Medication Sig Dispense Refill  . albuterol (PROVENTIL HFA;VENTOLIN HFA) 108 (90 BASE) MCG/ACT inhaler Inhale 1 puff into the lungs every 6 (six) hours as needed for wheezing.  (Patient not taking: Reported  on 10/26/2019)    . alendronate (FOSAMAX) 70 MG tablet Take 70 mg by mouth every Wednesday.  0  . aspirin EC 81 MG tablet Take 81 mg by mouth daily. Swallow whole.    . clobetasol ointment (TEMOVATE) 9.35 % Apply 1 application topically 2 (two) times daily as needed (rash). (Patient not taking: Reported on 10/26/2019)    . HYDROcodone-acetaminophen (NORCO) 7.5-325 MG tablet Take 1 tablet by mouth every 6 (six) hours as needed for moderate pain. (Patient not taking: Reported on 10/26/2019) 20 tablet 0  .  lactose free nutrition (BOOST) LIQD Take 237 mLs by mouth daily.    . mupirocin cream (BACTROBAN) 2 % Apply topically 2 (two) times daily. Apply to rash on the back (Patient not taking: Reported on 10/26/2019) 15 g 0  . OXYGEN Inhale 2 L into the lungs at bedtime.    . polyethylene glycol (MIRALAX / GLYCOLAX) 17 g packet Take 17 g by mouth 2 (two) times daily. (Patient taking differently: Take 17 g by mouth daily with lunch. ) 14 each 0  . tiotropium (SPIRIVA) 18 MCG inhalation capsule Place 18 mcg into inhaler and inhale daily.     No current facility-administered medications for this visit.    LABS: Lab Results  Component Value Date   WBC 9.7 09/30/2019   HGB 13.0 09/30/2019   HCT 40.3 09/30/2019   MCV 97.3 09/30/2019   PLT 316 09/30/2019      Component Value Date/Time   NA 140 08/09/2019 0220   K 4.2 08/09/2019 0220   CL 105 08/09/2019 0220   CO2 28 08/09/2019 0220   GLUCOSE 100 (H) 08/09/2019 0220   BUN 8 08/09/2019 0220   CREATININE 0.82 08/09/2019 0220   CALCIUM 8.6 (L) 08/09/2019 0220   GFRNONAA >60 08/09/2019 0220   GFRAA >60 08/09/2019 0220   Lab Results  Component Value Date   INR 1.03 04/03/2016   INR 1.09 06/04/2009   No results found for: PTT  SOCIAL HISTORY: Social History   Socioeconomic History  . Marital status: Married    Spouse name: Not on file  . Number of children: Not on file  . Years of education: Not on file  . Highest education level: Not on file  Occupational History  . Not on file  Tobacco Use  . Smoking status: Former Smoker    Packs/day: 2.00    Years: 50.00    Pack years: 100.00    Types: Cigarettes    Quit date: 10/16/1999    Years since quitting: 20.0  . Smokeless tobacco: Never Used  Vaping Use  . Vaping Use: Never used  Substance and Sexual Activity  . Alcohol use: Not Currently    Comment: 04/03/2016 "quit drinking 01/1987" occ use   . Drug use: No  . Sexual activity: Not Currently    Partners: Female  Other Topics  Concern  . Not on file  Social History Narrative  . Not on file   Social Determinants of Health   Financial Resource Strain:   . Difficulty of Paying Living Expenses: Not on file  Food Insecurity:   . Worried About Charity fundraiser in the Last Year: Not on file  . Ran Out of Food in the Last Year: Not on file  Transportation Needs:   . Lack of Transportation (Medical): Not on file  . Lack of Transportation (Non-Medical): Not on file  Physical Activity:   . Days of Exercise per Week: Not on file  .  Minutes of Exercise per Session: Not on file  Stress:   . Feeling of Stress : Not on file  Social Connections:   . Frequency of Communication with Friends and Family: Not on file  . Frequency of Social Gatherings with Friends and Family: Not on file  . Attends Religious Services: Not on file  . Active Member of Clubs or Organizations: Not on file  . Attends Archivist Meetings: Not on file  . Marital Status: Not on file  Intimate Partner Violence:   . Fear of Current or Ex-Partner: Not on file  . Emotionally Abused: Not on file  . Physically Abused: Not on file  . Sexually Abused: Not on file    FAMILY HISTORY: Family History  Problem Relation Age of Onset  . Pneumonia Mother     REVIEW OF SYSTEMS: Reviewed with the patient as per History of present illness. Psych: Patient denies having dental phobia.  DENTAL HISTORY: CHIEF COMPLAINT: Patient referred by Dr. Isidore Moos for a dental consultation.  HPI: Lonnie Schaefer is a 79 year old male recently diagnosed with carcinoma of the junction of the hard and soft palate.  Patient with anticipated radiation therapy and possible chemotherapy.  Patient is now seen as part of a prechemoradiation therapy dental protocol examination.  Patient currently denies acute toothaches, swellings, or abscesses.  Patient indicates that he is edentulous.  Patient has been edentulous for many years by report.  Patient has had 3 sets of  upper and lower complete dentures.  The last dentures were made approximately 10 years ago at [facility].  Patient no longer wears the dentures.  Patient denies having dental phobia.  DENTAL EXAMINATION: GENERAL: Patient is a well-developed, well-nourished male in no acute distress. HEAD AND NECK: There is no palpable neck lymphadenopathy.  The patient denies having acute TMJ symptoms.  Maximum interincisal opening is 50 mm plus INTRAORAL EXAM: Patient has normal saliva.  Patient is edentulous.  Patient has atrophy of the edentulous alveolar ridges.  There is a soft tissue lesion at the junction of the hard and soft palate on the left side consistent with cancer diagnosis. DENTITION: Patient is edentulous.  There is atrophy of the edentulous alveolar ridges. PROSTHODONTIC: Patient has been edentulous for many years.  Last dentures were made at [facility] approximately 10 years ago.  Patient no longer wears these dentures.  Patient is able to sustain nutrition without eating with his dentures by report. OCCLUSION: Upper and lower complete dentures are ill fitting by patient report.  RADIOGRAPHIC INTERPRETATION: Orthopantogram was taken today. The patient is edentulous.  There is atrophy of the edentulous alveolar ridges.  There is pneumatization of the bilateral maxillary sinuses.  ASSESSMENTS: 1.  Prescription for carcinoma at the junction of the hard and soft palate on the left side. 2.  Preradiation therapy dental protocol 3.  Patient is edentulous 4.  There is atrophy of the edentulous alveolar ridges. 5.  History of ill fitting maxillary mandibular complete dentures   PLAN/RECOMMENDATIONS: 1. I discussed the risks, benefits, and complications of various treatment options with the patient in relationship to his medical and dental conditions, dissipated radiation therapy, radiation therapy side effects to include xerostomia, radiation caries, trismus, mucositis, taste changes, gum and jawbone  changes, and risk for infection and osteoradionecrosis. We discussed various treatment options to include no treatment,  implant therapy, and replacement of missing teeth as indicated. The patient currently wishes to defer any dental treatment at this time.  The patient  is currently content with having no clinically acceptable dentures at this time.  Patient is aware of the option of implant therapy in the future.  Patient expresses understanding.  The patient is currently cleared for radiation therapy at this time.  Patient is to contact dental medicine for follow-up examination 1 month after the last radiation therapy has been completed.  Patient is aware that I will be leaving as of 11/05/2019 and will follow up with dental medicine with Dr. Sandi Mariscal as indicated.   2. Discussion of findings with medical team and coordination of future medical and dental care as needed.  I spent in excess of  90 minutes during the conduct of this consultation and >50% of this time involved direct face-to-face encounter for counseling and/or coordination of the patient's care.    Lenn Cal, DDS

## 2019-10-26 NOTE — Patient Instructions (Signed)

## 2019-10-26 NOTE — Telephone Encounter (Signed)
CALLED PATIENT TO ASK ABOUT COMING IN FOR LABS ON 11-03-19, PATIENT'S WIFE AGREED FOR HIM TO COME IN @ 12:30 PM ON 11-03-19

## 2019-10-26 NOTE — Progress Notes (Signed)
Oncology Nurse Navigator Documentation  Met with patient during initial consult with Dr. Squire He was accompanied by his daughter Dawn.  . Further introduced myself as his/their Navigator, explained my role as a member of the Care Team. . Provided New Patient Information packet: o Contact information for physician, this navigator, other members of the Care Team o Advance Directive information ([facility] blue pamphlet with LCSW insert); provided [facility] AD booklet at his request,  o Fall Prevention Patient Safety Plan o Financial Assistance Information sheet o Symptom Management Clinic information o [facility] map with highlight of [facility] o SLP Information sheet . Assisted with post-consult appt scheduling. . Escorted them to Dr. Kulinski's office for his scheduled appointment at 12:00 pm.   . They verbalized understanding of information provided. . I encouraged them to call with questions/concerns moving forward.   , RN, BSN, OCN Head & Neck Oncology Nurse Navigator [facility] [place] [phone] 

## 2019-10-26 NOTE — Progress Notes (Signed)
Dental Form with Estimates of Radiation Dose      Diagnosis:    ICD-10-CM   1. Malignant neoplasm of junction of hard and soft palate (HCC)  C05.9   2. 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     Prognosis: curative  Anticipated # of fractions: 20  - hypofractionation  Daily?: yes  # of weeks of radiotherapy: 4  Chemotherapy?: no  Anticipated xerostomia:  Mild permanent   Pre-simulation needs:    Simulation: Cannot wait for dental treatment unless something unexpectantly warrants dental surgery - call Dr Isidore Moos; would like to sim within the next week.  Other Notes:  Please contact Eppie Gibson, MD, with patient's disposition after evaluation and/or dental treatment.

## 2019-10-27 ENCOUNTER — Encounter: Payer: Self-pay | Admitting: Radiation Oncology

## 2019-10-27 ENCOUNTER — Encounter: Payer: Self-pay | Admitting: Pulmonary Disease

## 2019-10-27 LAB — SURGICAL PATHOLOGY

## 2019-10-28 ENCOUNTER — Encounter: Payer: Self-pay | Admitting: General Practice

## 2019-10-28 NOTE — Progress Notes (Signed)
[place] CSW Progress Notes  Call to patient per referral from Dr Isidore Moos for "social support."  Unable to reach, left VM w my contact information and encouragement to call back.  Edwyna Shell, LCSW Clinical Social Worker Phone:  [phone]

## 2019-10-29 ENCOUNTER — Encounter: Payer: Self-pay | Admitting: General Practice

## 2019-10-29 NOTE — Progress Notes (Signed)
[place] Psychosocial Distress Screening Clinical Social Work  Clinical Social Work was referred by distress screening protocol.  The patient scored a 2 on the Psychosocial Distress Thermometer which indicates mild distress. Clinical Social Worker contacted patient by phone to assess for distress and other psychosocial needs. Unable to reach patient by phone, will see in Head and Neck MDC.  Left VM w information on Support Center and my contact information - encouraged family to call as needed.    ONCBCN DISTRESS SCREENING 10/26/2019  Screening Type Initial Screening  Distress experienced in past week (1-10) 2  Physical Problem type Mouth sores/swallowing;Breathing  Physician notified of physical symptoms Yes  Referral to clinical psychology No  Referral to clinical social work No  Referral to dietition Yes  Referral to financial advocate No  Referral to support programs Yes  Referral to palliative care No    Clinical Social Worker follow up needed: Yes.    See in [facility]  If yes, follow up plan:  Beverely Pace, [place], LCSW Clinical Social Worker Phone:  [phone]

## 2019-11-03 ENCOUNTER — Ambulatory Visit (HOSPITAL_COMMUNITY)
Admission: RE | Admit: 2019-11-03 | Discharge: 2019-11-03 | Disposition: A | Discharge status: Home / Self Care | Payer: Medicare Other | Source: Ambulatory Visit | Attending: Radiation Oncology | Admitting: Radiation Oncology

## 2019-11-03 ENCOUNTER — Inpatient Hospital Stay: Payer: Medicare Other | Attending: Radiation Oncology

## 2019-11-03 ENCOUNTER — Other Ambulatory Visit: Payer: Self-pay

## 2019-11-03 ENCOUNTER — Inpatient Hospital Stay: Payer: Medicare Other

## 2019-11-03 ENCOUNTER — Ambulatory Visit
Admission: RE | Admit: 2019-11-03 | Discharge: 2019-11-03 | Disposition: A | Discharge status: Home / Self Care | Payer: Medicare Other | Source: Ambulatory Visit | Attending: Radiation Oncology | Admitting: Radiation Oncology

## 2019-11-03 ENCOUNTER — Other Ambulatory Visit: Payer: Self-pay | Admitting: *Deleted

## 2019-11-03 DIAGNOSIS — J439 Emphysema, unspecified: Secondary | ICD-10-CM | POA: Diagnosis not present

## 2019-11-03 DIAGNOSIS — I700 Atherosclerosis of aorta: Secondary | ICD-10-CM | POA: Insufficient documentation

## 2019-11-03 DIAGNOSIS — I2510 Atherosclerotic heart disease of native coronary artery without angina pectoris: Secondary | ICD-10-CM | POA: Insufficient documentation

## 2019-11-03 DIAGNOSIS — C050 Malignant neoplasm of hard palate: Secondary | ICD-10-CM

## 2019-11-03 DIAGNOSIS — Z79899 Other long term (current) drug therapy: Secondary | ICD-10-CM | POA: Insufficient documentation

## 2019-11-03 DIAGNOSIS — C059 Malignant neoplasm of palate, unspecified: Secondary | ICD-10-CM

## 2019-11-03 LAB — TSH: TSH: 4.782 u[IU]/mL — ABNORMAL HIGH (ref 0.320–4.118)

## 2019-11-03 LAB — T4, FREE: Free T4: 0.83 ng/dL (ref 0.61–1.12)

## 2019-11-03 NOTE — Progress Notes (Signed)
Nutrition Assessment:  New head and neck cancer patient  79 year old male with SCC of left hard/soft palate.  Past medical history of cancer of hard palate about 7 years ago treated without radiation (oral surgery), SBO, COPD, stomach ulcers.  Planning radiation.    Met with patient family member. Patient is edentulous, does not wear dentures.  Reports that he mainly eats soft foods.  Breakfast is usually around 9-10am of eggs, grits or milk gravy and toast, sometimes cereal.  Eats lunch around 2pm of sandwich (tomato or ham or bolonga, with V8 juice or apple juice.  Eats evening meal 4:30-5pm at [employer]  Eat mostly vegetables including beans (pintos, green peas, etc).  Will also eat grilled cheese. Drinks boost/ensure high protein shake daily, in am with medications.  Does eat hamburger, sausage, vienna sausage, spam at times.       Medications: reviewed  Labs: reviewed  Anthropometrics:   Height: 70 inches Weight: 160 lb on 9/21 UBW: 160-165 lb.  MD told him to keep weight in this range due to COPD BMI: 22   NUTRITION DIAGNOSIS:  Predicted sub optimal energy intake related to cancer of roof of mouth as evidenced by planning radiation and side effects to effect nutrient intake    INTERVENTION:  Discussed foods high in protein. Provided handout on soft moist protein foods.  Encouraged high calorie, high protein shakes.  Samples of ensure complete, ensure enlive and boost plus given to patient to try along with coupons.  Planning to drink 1-2 times per day Encouraged protein food at every meal. Contact information given    MONITORING, EVALUATION, GOAL: weight trends, intake   NEXT VISIT: October 12 after radiation  Safiyyah Vasconez B. Zenia Resides, [place], [place] Registered Dietitian [phone] (mobile)

## 2019-11-03 NOTE — Progress Notes (Signed)
Oncology Nurse Navigator Documentation  Mr. Lonnie Schaefer presented for CT simulation today with his daughter.  He tolerated procedure without difficulty, denied questions/concerns. He will start radiation on 11/10/19 and complete on 12/07/19. He knows to call me if he has any further questions or concerns.  Harlow Asa RN, BSN, OCN Head & Neck Oncology Nurse [place] at [facility] Phone # [phone]  Fax # [phone]

## 2019-11-03 NOTE — Progress Notes (Signed)
Pt here for COVID-19 booster vaccination, but did not have vaccine card to verify initial vaccines, and pt stated that he received them at [facility]. Pt instructed to locate card or to contact [facility] for the documentation, and to reschedule appointment. Pt verbalized understanding.

## 2019-11-05 ENCOUNTER — Encounter: Payer: Self-pay | Admitting: Radiation Oncology

## 2019-11-05 NOTE — Progress Notes (Signed)
I left a VM for patient, wishing him a happy birthday and informing him that chest CT results are good; we will proceed w/ RT as planned.  -----------------------------------  Eppie Gibson, MD

## 2019-11-08 DIAGNOSIS — C050 Malignant neoplasm of hard palate: Secondary | ICD-10-CM | POA: Diagnosis present

## 2019-11-08 DIAGNOSIS — Z510 Encounter for antineoplastic radiation therapy: Secondary | ICD-10-CM | POA: Insufficient documentation

## 2019-11-08 DIAGNOSIS — Z87891 Personal history of nicotine dependence: Secondary | ICD-10-CM | POA: Diagnosis not present

## 2019-11-10 ENCOUNTER — Ambulatory Visit
Admission: RE | Admit: 2019-11-10 | Discharge: 2019-11-10 | Disposition: A | Discharge status: Home / Self Care | Payer: Medicare Other | Source: Ambulatory Visit | Attending: Radiation Oncology | Admitting: Radiation Oncology

## 2019-11-10 ENCOUNTER — Other Ambulatory Visit: Payer: Self-pay

## 2019-11-10 ENCOUNTER — Ambulatory Visit: Payer: Medicare Other

## 2019-11-10 DIAGNOSIS — Z510 Encounter for antineoplastic radiation therapy: Secondary | ICD-10-CM | POA: Diagnosis not present

## 2019-11-10 NOTE — Progress Notes (Signed)
Oncology Nurse Navigator Documentation  To provide support, encouragement and care continuity, met with Lonnie Schaefer for his initial RT.  He was accompanied by his daughter, Lonnie Schaefer.  I reviewed the 2-step treatment process, answered questions.   [name] completed treatment without difficulty, denied questions/concerns.  I reviewed the registration/arrival procedure for subsequent treatments.  I encouraged them to call me with questions/concerns as tmts proceed.   Harlow Asa RN, BSN, OCN Head & Neck Oncology Nurse [place] at [facility] Phone # [phone]  Fax # [phone]

## 2019-11-11 ENCOUNTER — Other Ambulatory Visit: Payer: Self-pay

## 2019-11-11 ENCOUNTER — Ambulatory Visit: Payer: Medicare Other | Attending: Radiation Oncology | Admitting: Physical Therapy

## 2019-11-11 ENCOUNTER — Ambulatory Visit
Admission: RE | Admit: 2019-11-11 | Discharge: 2019-11-11 | Disposition: A | Discharge status: Home / Self Care | Payer: Medicare Other | Source: Ambulatory Visit | Attending: Radiation Oncology | Admitting: Radiation Oncology

## 2019-11-11 ENCOUNTER — Encounter: Payer: Self-pay | Admitting: Physical Therapy

## 2019-11-11 DIAGNOSIS — R1310 Dysphagia, unspecified: Secondary | ICD-10-CM | POA: Insufficient documentation

## 2019-11-11 DIAGNOSIS — R293 Abnormal posture: Secondary | ICD-10-CM

## 2019-11-11 DIAGNOSIS — C050 Malignant neoplasm of hard palate: Secondary | ICD-10-CM | POA: Diagnosis present

## 2019-11-11 DIAGNOSIS — Z510 Encounter for antineoplastic radiation therapy: Secondary | ICD-10-CM | POA: Diagnosis not present

## 2019-11-11 NOTE — Progress Notes (Signed)
Oncology Nurse Navigator Documentation  I met with Mr. Postiglione and his daughter Arrie Aran before he saw Allyson Sabal PT during Head and Neck MDC today. He is tolerating radiation well and denies any concerns at this time. I assisted to the [facility] after his appointment and also helped his daughter get MyChart set up for her father. They know to call me if they have any further questions or concerns.   Harlow Asa RN, BSN, OCN Head & Neck Oncology Nurse [place] at [facility] Phone # [phone]  Fax # [phone]

## 2019-11-11 NOTE — Therapy (Signed)
[place], [place], [address] Phone: [phone]   Fax:  [phone]  Physical Therapy Evaluation  Patient Details  Name: Lonnie Schaefer MRN: [id number] Date of Birth: December 16, 1940 Referring Provider (PT): Reita May Date: 11/11/2019   PT End of Session - 11/11/19 1059    Visit Number 1    Number of Visits 2    Date for PT Re-Evaluation 01/06/20    PT Start Time 1941    PT Stop Time 1059    PT Time Calculation (min) 24 min    Activity Tolerance Patient tolerated treatment well    Behavior During Therapy [facility] for tasks assessed/performed           Past Medical History:  Diagnosis Date  . Bowel obstruction ([place]) 06/2009-10/01/2013 X 5; 04/03/2016  . COPD (chronic obstructive pulmonary disease) ([place])   . H/O hiatal hernia   . History of stomach ulcers ~ 1960  . Hypoxia    idiopathic sleep related hypoxia  . Impaired fasting glucose   . On home oxygen therapy    "2L q hs" (04/03/2016)  . Oral cancer ([place])   . Osteoporosis   . Pneumonia 1990s X 1  . SBO (small bowel obstruction) ([place]) 04/21/2017  . Shortness of breath     Past Surgical History:  Procedure Laterality Date  . ABDOMINAL EXPLORATION SURGERY  06/2009; 10/2009  . ABDOMINAL SURGERY    . APPENDECTOMY  10/2009  . CHOLECYSTECTOMY N/A 09/06/2016   Procedure: LAPAROSCOPIC CHOLECYSTECTOMY WITH  INTRAOPERATIVE CHOLANGIOGRAM;  Surgeon: Ralene Ok, MD;  Location: [place];  Service: General;  Laterality: N/A;  . COLONOSCOPY    . ESOPHAGOGASTRODUODENOSCOPY     Dr Oletta Lamas   . GUM SURGERY    . GUM SURGERY     upper gum surgery, cancer   . HERNIA REPAIR     "naval"  . LESION REMOVAL Left 10/04/2019   Procedure: ORAL LESION REMOVAL W/BIOPSY;  Surgeon: [name], MD;  Location: [place];  Service: ENT;  Laterality: Left;  . 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 UMBILICAL HERNIA REPAIR  06/2009   w/mesh     There were no vitals filed for this visit.    Subjective Assessment - 11/11/19 1032    Subjective I am feeling spicy today. I am just fine.    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 No/denies    Pain Score 0-No pain              [facility] PT Assessment - 11/11/19 0001      Assessment   Medical Diagnosis cancer at junction of hard and soft palate    Referring Provider (PT) Isidore Moos    Onset Date/Surgical Date 10/04/19    Hand Dominance Right    Prior Therapy none      Precautions   Precautions Other (comment)    Precaution Comments active cancer      Restrictions   Weight Bearing Restrictions No      Balance Screen   Has the patient fallen in the past 6 months No    Has the patient had a decrease in activity level because of a fear  of falling?  No    Is the patient reluctant to leave their home because of a fear of falling?  No      Home Ecologist residence    Living Arrangements Spouse/significant other    Available Help at Discharge Family    Type of [place] to enter    Entrance Stairs-Number of Steps 3    [place] One level      Prior Function   Level of [place] Retired    Leisure pt does not formally exercise but works out in the yard      Cognition   Overall Cognitive Status Within Functional Limits for tasks assessed      Functional Tests   Functional tests Sit to Stand      Sit to Stand   Comments 30 sit to stand: 9 reps avg is 13   pt has COPD     Posture/Postural Control   Posture/Postural Control Postural limitations    Postural Limitations Rounded Shoulders;Forward head      AROM   Overall AROM  Comments shoulder ROM WFL    AROM Assessment Site Cervical    Cervical Flexion WFL    Cervical Extension WFL    Cervical - Right Side Bend WFL    Cervical - Left Side Bend 50% limited    Cervical - Right Rotation WFL    Cervical - Left Rotation [facility]      Ambulation/Gait   Ambulation/Gait Yes    Ambulation/Gait Assistance 7: Independent    Ambulation Distance (Feet) 10 Feet    Gait Pattern Within Functional Limits             LYMPHEDEMA/ONCOLOGY QUESTIONNAIRE - 11/11/19 0001      Type   Cancer Type cancer at junction of hard palate and soft palate      Lymphedema Assessments   Lymphedema Assessments Head and Neck      Head and Neck   4 cm superior to sternal notch around neck 40.5 cm    6 cm superior to sternal notch around neck 40.3 cm    8 cm superior to sternal notch around neck 40.5 cm                   Objective measurements completed on examination: See above findings.               PT Education - 11/11/19 1102    Education Details Neck ROM, importance of posture when sitting, standing and lying down, deep breathing, walking program and importance of staying active throughout treatment, CURE article on staying active, "Why exercise?" flyer, lymphedema and PT info    Person(s) Educated Patient    Methods Explanation;Handout    Comprehension Verbalized understanding;Returned demonstration               PT Long Term Goals - 11/11/19 1100      PT LONG TERM GOAL #1   Title Pt will return to baseline ROM measurements and not demonstrate any signs of lymphedema to allow pt to return to PLOF.    Time 8    Period Weeks    Status New    Target Date 01/06/20              Head and Neck Clinic Goals - 11/11/19 1100      Patient will  be able to verbalize understanding of a home exercise program for cervical range of motion, posture, and walking.    Time 1    Period Days    Status Achieved      Patient will be able to verbalize  understanding of proper sitting and standing posture.    Time 1    Period Days    Status Achieved      Patient will be able to verbalize understanding of lymphedema risk and availability of treatment for this condition.    Time 1    Period Days    Status Achieved              Plan - 11/11/19 1104    Clinical Impression Statement Pt presents to PT with newly diagnosed cancer at junction between hard and soft palate. He is currently undergoing radiation and will complete it on 01/06/20. Pt's shoulder ROM is WFL. His cervical lateral flexion is the only motion that is limited and it is limited approximately 50%.Educated pt about signs and symptoms of lymphedema as well as anatomy and physiology of lymphatic system. Educated him in importance of staying as active as possible throughout treatment to decrease fatigue and in head and neck ROM exercises to decrease loss of ROM. Will reassess pt in 8 weeks and compare to baseline.    Personal Factors and Comorbidities Comorbidity 1    Comorbidities COPD    Stability/Clinical Decision Making Stable/Uncomplicated    Clinical Decision Making Low    Rehab Potential Good    PT Frequency --   eval and 1 f/u visit   PT Duration 8 weeks    PT Treatment/Interventions ADLs/Self Care Home Management;Therapeutic exercise;Patient/family education    PT Next Visit Plan reassess baselines    PT Home Exercise Plan head and neck ROM exercises    Consulted and Agree with Plan of Care Patient           Patient will benefit from skilled therapeutic intervention in order to improve the following deficits and impairments:  Decreased activity tolerance, Postural dysfunction  Visit Diagnosis: Abnormal posture  Malignant neoplasm of hard palate [facility])     Problem List Patient Active Problem List   Diagnosis Date Noted  . 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 Supplemental  oxygen dependent 08/09/2019  . Lactic acidosis 08/05/2019  . 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[place]) 08/05/2019  . Acute abdominal pain   . Acute cholecystitis 09/05/2016  . Leukocytosis 04/03/2016  . SBO (small bowel obstruction) ([place]) 04/03/2016  . Hyperbilirubinemia 04/03/2016  . AKI (acute kidney injury) ([place]) 04/03/2016  . Nausea, vomiting and diarrhea 10/01/2013  . COPD (chronic obstructive pulmonary disease) ([place]) 02/23/2007    Allyson Sabal [facility] 11/11/2019, 11:49 AM  [place], [place], [address] Phone: [phone]   Fax:  [phone]  Name: Lonnie Schaefer MRN: [id number] Date of Birth: [birth year]  Manus Gunning, PT 11/11/19 11:50 AM

## 2019-11-12 ENCOUNTER — Ambulatory Visit
Admission: RE | Admit: 2019-11-12 | Discharge: 2019-11-12 | Disposition: A | Discharge status: Home / Self Care | Payer: Medicare Other | Source: Ambulatory Visit | Attending: Radiation Oncology | Admitting: Radiation Oncology

## 2019-11-12 DIAGNOSIS — Z510 Encounter for antineoplastic radiation therapy: Secondary | ICD-10-CM | POA: Diagnosis not present

## 2019-11-15 ENCOUNTER — Other Ambulatory Visit: Payer: Self-pay | Admitting: Radiation Oncology

## 2019-11-15 ENCOUNTER — Ambulatory Visit
Admission: RE | Admit: 2019-11-15 | Discharge: 2019-11-15 | Disposition: A | Discharge status: Home / Self Care | Payer: Medicare Other | Source: Ambulatory Visit | Attending: Radiation Oncology | Admitting: Radiation Oncology

## 2019-11-15 DIAGNOSIS — C050 Malignant neoplasm of hard palate: Secondary | ICD-10-CM

## 2019-11-15 DIAGNOSIS — Z510 Encounter for antineoplastic radiation therapy: Secondary | ICD-10-CM | POA: Diagnosis not present

## 2019-11-15 DIAGNOSIS — C059 Malignant neoplasm of palate, unspecified: Secondary | ICD-10-CM

## 2019-11-15 MED ORDER — SONAFINE EX EMUL
1.0000 "application " | Freq: Two times a day (BID) | CUTANEOUS | Status: DC
  Administered 2019-11-15: 1 via TOPICAL

## 2019-11-15 MED ORDER — LIDOCAINE VISCOUS HCL 2 % MT SOLN: OROMUCOSAL | 4 refills | Status: DC

## 2019-11-15 MED FILL — LIDOCAINE 2% VISCOUS SOLN: 2 | 6 days supply | Qty: 200 | Fill #0

## 2019-11-15 NOTE — Progress Notes (Signed)

## 2019-11-16 ENCOUNTER — Other Ambulatory Visit: Payer: Self-pay

## 2019-11-16 ENCOUNTER — Inpatient Hospital Stay: Payer: Medicare Other | Attending: Radiation Oncology

## 2019-11-16 ENCOUNTER — Ambulatory Visit
Admission: RE | Admit: 2019-11-16 | Discharge: 2019-11-16 | Disposition: A | Discharge status: Home / Self Care | Payer: Medicare Other | Source: Ambulatory Visit | Attending: Radiation Oncology | Admitting: Radiation Oncology

## 2019-11-16 DIAGNOSIS — Z510 Encounter for antineoplastic radiation therapy: Secondary | ICD-10-CM | POA: Diagnosis not present

## 2019-11-16 NOTE — Progress Notes (Signed)
Nutrition Follow-up:  Patient with SCC of left hard/soft palate.  Patient receiving radiation.   Met with patient and daughter after radiation.  Patient reports that appetite is the same.  Denies nutrition impact symptoms at this time.  Eating about the same for breakfast, lunch and dinner as before.  Tried all oral nutrition supplements and liked them. Drinking 1 per day.    Medications: reviewed  Labs: reviewed  Anthropometrics:   Weight 158 lb on 10/11 per Aria decreased from 160 lb  Reports MD wants his weight to stay around 160-165 lb due to COPD and breathing.     NUTRITION DIAGNOSIS: Predicted sub optimal energy intake continues   INTERVENTION:  Complimentary case of ensure enlive given to patient today. Encouraged 1-2 per day. Encouraged high calorie high protein foods to maintain weight during treatment    MONITORING, EVALUATION, GOAL: weight trends, intake   NEXT VISIT: Tuesday, October 19 after radiation  Kally Cadden B. Zenia Resides, [place], [place] Registered Dietitian [phone] (mobile)

## 2019-11-17 ENCOUNTER — Ambulatory Visit
Admission: RE | Admit: 2019-11-17 | Discharge: 2019-11-17 | Disposition: A | Discharge status: Home / Self Care | Payer: Medicare Other | Source: Ambulatory Visit | Attending: Radiation Oncology | Admitting: Radiation Oncology

## 2019-11-17 ENCOUNTER — Ambulatory Visit: Payer: Medicare Other

## 2019-11-17 DIAGNOSIS — R1310 Dysphagia, unspecified: Secondary | ICD-10-CM

## 2019-11-17 DIAGNOSIS — R293 Abnormal posture: Secondary | ICD-10-CM | POA: Diagnosis not present

## 2019-11-17 DIAGNOSIS — Z510 Encounter for antineoplastic radiation therapy: Secondary | ICD-10-CM | POA: Diagnosis not present

## 2019-11-17 NOTE — Patient Instructions (Signed)
SWALLOWING EXERCISES Do these until 6 months after your last day of radiation, then 2-3 times per week afterwards  1. Effortful Swallows - Press your tongue against the roof of your mouth for 3 seconds, then squeeze the muscles in your neck while you swallow your saliva or a sip of water - Repeat 10-15 times, 2-3 times a day, and use whenever you eat or drink  2. Masako Swallow - swallow with your tongue sticking out - Stick tongue out past your teeth and gently bite tongue with your teeth - Swallow, while holding your tongue with your teeth - Repeat 10-15 times, 2-3 times a day *use a wet spoon if your mouth gets dry*  3. Pitch Raise - Repeat "he", once per second in as high of a pitch as you can - Repeat 20 times, 2-3 times a day  4. Shaker Exercise - head lift - Lie flat on your back in your bed or on a couch without pillows - Raise your head and look at your feet - KEEP YOUR SHOULDERS DOWN - HOLD FOR 45-60 SECONDS, then lower your head back down - Repeat 3 times, 2-3 times a day  5. Mendelsohn Maneuver - "half swallow" exercise - Start to swallow, and keep your Adam's apple up by squeezing hard with the muscles of the throat - Hold the squeeze for 5-7 seconds and then relax - Repeat 10-15 times, 2-3 times a day *use a wet spoon if your mouth gets dry*  6. Tongue Stretch/Teeth Clean - Move your tongue around the pocket between your gums and teeth, clockwise and then counter-clockwise - Repeat on the back side, clockwise and then counter-clockwise - Repeat 15-20 times, 2-3 times a day  7. Breath Hold - Say "HUH!" loudly, then hold your breath for 3 seconds at your voice box - Repeat 20 times, 2-3 times a day  8. Chin pushback - Open your mouth  - Place your fist UNDER your chin near your neck - Tuck your chin and push back with your fist for 5 seconds - Repeat 10 times, 2-3 times a day        9.  Open mouth swallow  - Open your mouth and swallow your saliva  - Repeat  10 times, 2-3 times a day       10. "Super Swallow"  - Take a breath and hold it  - Bear down (like pushing your bowels)  - Swallow then IMMEDIATELY cough  - Repeat 10 times, 2-3 times a day

## 2019-11-18 ENCOUNTER — Ambulatory Visit
Admission: RE | Admit: 2019-11-18 | Discharge: 2019-11-18 | Disposition: A | Discharge status: Home / Self Care | Payer: Medicare Other | Source: Ambulatory Visit | Attending: Radiation Oncology | Admitting: Radiation Oncology

## 2019-11-18 DIAGNOSIS — Z510 Encounter for antineoplastic radiation therapy: Secondary | ICD-10-CM | POA: Diagnosis not present

## 2019-11-18 NOTE — Therapy (Signed)
[place] [address] [place], [place], [address] Phone: [phone]   Fax:  [phone]  Speech Language Pathology Evaluation  Patient Details  Name: Lonnie Schaefer MRN: [id number] Date of Birth: 10/18/1940 Referring Provider (SLP): Lonnie Gibson, MD   Encounter Date: 11/17/2019   End of Session - 11/18/19 0904    Visit Number 1    Number of Visits 7    Date for SLP Re-Evaluation 02/15/20    SLP Start Time 9798    SLP Stop Time  1445    SLP Time Calculation (min) 42 min    Activity Tolerance Patient tolerated treatment well           Past Medical History:  Diagnosis Date  . Bowel obstruction ([place]) 06/2009-10/01/2013 X 5; 04/03/2016  . COPD (chronic obstructive pulmonary disease) ([place])   . H/O hiatal hernia   . History of stomach ulcers ~ 1960  . Hypoxia    idiopathic sleep related hypoxia  . Impaired fasting glucose   . On home oxygen therapy    "2L q hs" (04/03/2016)  . Oral cancer ([place])   . Osteoporosis   . Pneumonia 1990s X 1  . SBO (small bowel obstruction) ([place]) 04/21/2017  . Shortness of breath     Past Surgical History:  Procedure Laterality Date  . ABDOMINAL EXPLORATION SURGERY  06/2009; 10/2009  . ABDOMINAL SURGERY    . APPENDECTOMY  10/2009  . CHOLECYSTECTOMY N/A 09/06/2016   Procedure: LAPAROSCOPIC CHOLECYSTECTOMY WITH  INTRAOPERATIVE CHOLANGIOGRAM;  Surgeon: Ralene Ok, MD;  Location: [place];  Service: General;  Laterality: N/A;  . COLONOSCOPY    . ESOPHAGOGASTRODUODENOSCOPY     Dr Lonnie Schaefer   . GUM SURGERY    . GUM SURGERY     upper gum surgery, cancer   . HERNIA REPAIR     "naval"  . LESION REMOVAL Left 10/04/2019   Procedure: ORAL LESION REMOVAL W/BIOPSY;  Surgeon: [name], MD;  Location: [place];  Service: ENT;  Laterality: Left;  . 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 UMBILICAL HERNIA REPAIR  06/2009   w/mesh    There were no vitals filed for  this visit.   Subjective Assessment - 11/17/19 1409    Subjective "Supposed to see if everything's working like it supposed to be."    Patient is accompained by: Family member   daughter             SLP Evaluation [name] - 11/18/19 0001      SLP Visit Information   SLP Received On 11/17/19    Referring Provider (SLP) Lonnie Gibson, MD    Onset Date Spring 2021    Medical Diagnosis lt hard/soft palate SCCA      Subjective   Subjective Pt is eating primarily soft foods with some items in regular diet, and drinking thin liquids.       General Information   HPI Pt with sore roof of mouth since spring 2021. Saw Dr. Constance Schaefer 10-04-19 who partially removed oral lesion and took biopsy which revealed SCCA of hard/soft palate. Pt opted for rad alone instead of resection and subsequent radiation due to rad is plan with least morbidity (better speech, swallowing, no need for palatal device/prostheses. PMH does include hard palate CA 7-8 years ago in the same general area, treated with sx -Dr. Janace Schaefer. Hx COPD, hiatal hernia, hypoxia.  Pt ate a muffin and drank water without overt s/s aspiration. Thyroid elevation appeared adequate, and swallows appeared timely. Oral residue noted as WNL. Pt's swallow deemed WNL/WFL at this time. Pt masticated on rt side due to lt sided discomfort with solids.  Because data states the risk for dysphagia during and after radiation treatment is high due to undergoing radiation tx, SLP taught pt about the possibility of reduced/limited ability for PO intake during rad tx. SLP encouraged pt to complete HEP shortly after administration of pain meds, if pain is an ongoing issue. Among other modifications for days when pt cannot functionally swallow, SLP talked about performing only non-swallowing tasks on the handout/HEP, and then adding swallowing tasks back in when it becomes possible to do so.  SLP educated pt re: changes to swallowing musculature after rad tx,  and why adherence to dysphagia HEP provided today and PO consumption was necessary to reduce muscle fibrosis following rad tx. Pt demonstrated understanding of these things to SLP.    SLP then developed a HEP for pt and pt was instructed how to perform exercises involving lingual, vocal, and pharyngeal strengthening. SLP performed each exercise and pt return demonstrated each exercise. SLP ensured pt performance was correct prior to moving on to next exercise. Pt was instructed to complete this program 2 times a day until 6 months after his last rad tx, then x2 a week after that.          SLP Education - 11/15/19 1523         Education Details HEP procedure and frequency/duration, late effects head/neck radiation     Person(s) Educated Patient     Methods Explanation;Demonstration;Verbal cues;Handout     Comprehension Verbalized understanding;Verbal cues required;Returned demonstration;Need further instruction                  SLP Short Term Goals - 11/16/19 1732              SLP SHORT TERM GOAL #1    Title pt will complete HEP with rare min A     Time 2     Period --   sessions, for all STGs    Status New          SLP SHORT TERM GOAL #2    Title pt will tell SLP why pt is completing HEP (rationale) with modified independence     Time 2     Status New          SLP SHORT TERM GOAL #3    Title pt will describe 3 overt s/s aspiration PNA with modified independence     Time 3     Status New          SLP SHORT TERM GOAL #4    Title pt will tell SLP how a food journal could hasten return to a more normalized diet     Time 3     Status New                  SLP Long Term Goals - 11/16/19 1734              SLP LONG TERM GOAL #1    Title pt will complete HEP with modified independence over two visits     Time 4     Period --   sessions, for all LTGs    Status New          SLP LONG TERM GOAL #  2    Title pt will describe  how to modify HEP over time, and the timeline associated with reduction in HEP frequency with modified independence over two sessions     Time 6     Status New                  Plan - 11/16/19 1728         Clinical Impression Statement At this time pt swallowing is deemed WNL/WFL with dys III solids and thin liquids. SLP designed an individualized HEP for dysphagia and pt completed each exercise on their own with occasional mod cues, initially. There are no overt s/s aspiration reported by pt at this time. Data indicate that pt's swallow ability will likely decrease over the course of radiation therapy and could very well decline over time following conclusion of their radiation therapy due muscle fibrosis. Pt will cont to need to be seen by SLP in order to assess safety of PO intake, assess the need for recommending any objective swallow assessment, and ensuring pt correctly completes the individualized HEP.     Speech Therapy Frequency --   once every approx 4 weeks    Duration --   7 total visits    Treatment/Interventions Aspiration precaution training;Pharyngeal strengthening exercises;Trials of upgraded texture/liquids;Diet toleration management by SLP;Internal/external aids;Patient/family education;SLP instruction and feedback     Potential to [place] provided today     Consulted and Agree with Plan of Care Patient                                 Patient will benefit from skilled therapeutic intervention in order to improve the following deficits and impairments:   Dysphagia, unspecified type - Plan: SLP plan of care cert/re-cert    Problem List Patient Active Problem List   Diagnosis Date Noted  . 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 Supplemental oxygen dependent 08/09/2019  . Lactic acidosis 08/05/2019  . Chronic hypoxemic  respiratory failure ([place]) 08/05/2019  . Acute abdominal pain   . Acute cholecystitis 09/05/2016  . Leukocytosis 04/03/2016  . SBO (small bowel obstruction) ([place]) 04/03/2016  . Hyperbilirubinemia 04/03/2016  . AKI (acute kidney injury) ([place]) 04/03/2016  . Nausea, vomiting and diarrhea 10/01/2013  . COPD (chronic obstructive pulmonary disease) ([place]) 02/23/2007    [facility] ,[place], [place]  11/18/2019, 9:06 AM  [place] [address] [place], [place], [address] Phone: [phone]   Fax:  [phone]  Name: Lonnie Schaefer MRN: [id number] Date of Birth: 08-03-1940

## 2019-11-19 ENCOUNTER — Ambulatory Visit
Admission: RE | Admit: 2019-11-19 | Discharge: 2019-11-19 | Disposition: A | Discharge status: Home / Self Care | Payer: Medicare Other | Source: Ambulatory Visit | Attending: Radiation Oncology | Admitting: Radiation Oncology

## 2019-11-19 DIAGNOSIS — Z510 Encounter for antineoplastic radiation therapy: Secondary | ICD-10-CM | POA: Diagnosis not present

## 2019-11-22 ENCOUNTER — Ambulatory Visit
Admission: RE | Admit: 2019-11-22 | Discharge: 2019-11-22 | Disposition: A | Discharge status: Home / Self Care | Payer: Medicare Other | Source: Ambulatory Visit | Attending: Radiation Oncology | Admitting: Radiation Oncology

## 2019-11-22 ENCOUNTER — Other Ambulatory Visit: Payer: Self-pay

## 2019-11-22 DIAGNOSIS — Z510 Encounter for antineoplastic radiation therapy: Secondary | ICD-10-CM | POA: Diagnosis not present

## 2019-11-23 ENCOUNTER — Ambulatory Visit: Payer: Medicare Other

## 2019-11-23 ENCOUNTER — Other Ambulatory Visit: Payer: Self-pay

## 2019-11-23 ENCOUNTER — Ambulatory Visit
Admission: RE | Admit: 2019-11-23 | Discharge: 2019-11-23 | Disposition: A | Discharge status: Home / Self Care | Payer: Medicare Other | Source: Ambulatory Visit | Attending: Radiation Oncology | Admitting: Radiation Oncology

## 2019-11-23 DIAGNOSIS — Z510 Encounter for antineoplastic radiation therapy: Secondary | ICD-10-CM | POA: Diagnosis not present

## 2019-11-23 NOTE — Progress Notes (Signed)
Nutrition Follow-up:  Patient with SCC of left hard/soft palate.  Patient receiving radiation.    Met with patient and daughter after radiation.  Patient reports appetite is about the same.  Reports sensitivity to roof of mouth and tightness.  Drinking 1 ensure enlive daily sometimes 2.  Patient met with SLP and reports that he is doing exercises    Medications: reviewed  Labs: reviewed  Anthropometrics:   Weight 158 lb 8 oz per Aria on 10/18 158 lb on 10/11 UBW 160-165 lb   NUTRITION DIAGNOSIS: Predicted suboptimal energy intake continues   INTERVENTION:  Complimentary caes of ensure enlive given to patient today.  Encouraged high calorie, high protein foods Encouraged performing SLP exercises    MONITORING, EVALUATION, GOAL: weight trends, intake   NEXT VISIT: October 26 after radiation  Krystyne Tewksbury B. Zenia Resides, [place], [place] Registered Dietitian [phone] (mobile)

## 2019-11-24 ENCOUNTER — Ambulatory Visit
Admission: RE | Admit: 2019-11-24 | Discharge: 2019-11-24 | Disposition: A | Discharge status: Home / Self Care | Payer: Medicare Other | Source: Ambulatory Visit | Attending: Radiation Oncology | Admitting: Radiation Oncology

## 2019-11-24 DIAGNOSIS — Z510 Encounter for antineoplastic radiation therapy: Secondary | ICD-10-CM | POA: Diagnosis not present

## 2019-11-25 ENCOUNTER — Ambulatory Visit
Admission: RE | Admit: 2019-11-25 | Discharge: 2019-11-25 | Disposition: A | Discharge status: Home / Self Care | Payer: Medicare Other | Source: Ambulatory Visit | Attending: Radiation Oncology | Admitting: Radiation Oncology

## 2019-11-25 DIAGNOSIS — Z510 Encounter for antineoplastic radiation therapy: Secondary | ICD-10-CM | POA: Diagnosis not present

## 2019-11-26 ENCOUNTER — Other Ambulatory Visit: Payer: Self-pay

## 2019-11-26 ENCOUNTER — Ambulatory Visit
Admission: RE | Admit: 2019-11-26 | Discharge: 2019-11-26 | Disposition: A | Discharge status: Home / Self Care | Payer: Medicare Other | Source: Ambulatory Visit | Attending: Radiation Oncology | Admitting: Radiation Oncology

## 2019-11-26 DIAGNOSIS — Z510 Encounter for antineoplastic radiation therapy: Secondary | ICD-10-CM | POA: Diagnosis not present

## 2019-11-29 ENCOUNTER — Other Ambulatory Visit: Payer: Self-pay

## 2019-11-29 ENCOUNTER — Ambulatory Visit
Admission: RE | Admit: 2019-11-29 | Discharge: 2019-11-29 | Disposition: A | Discharge status: Home / Self Care | Payer: Medicare Other | Source: Ambulatory Visit | Attending: Radiation Oncology | Admitting: Radiation Oncology

## 2019-11-29 DIAGNOSIS — Z510 Encounter for antineoplastic radiation therapy: Secondary | ICD-10-CM | POA: Diagnosis not present

## 2019-11-29 DIAGNOSIS — C050 Malignant neoplasm of hard palate: Secondary | ICD-10-CM

## 2019-11-29 MED ORDER — SONAFINE EX EMUL
1.0000 "application " | Freq: Two times a day (BID) | CUTANEOUS | Status: DC
  Administered 2019-11-29: 1 via TOPICAL

## 2019-11-30 ENCOUNTER — Inpatient Hospital Stay: Payer: Medicare Other

## 2019-11-30 ENCOUNTER — Ambulatory Visit
Admission: RE | Admit: 2019-11-30 | Discharge: 2019-11-30 | Disposition: A | Discharge status: Home / Self Care | Payer: Medicare Other | Source: Ambulatory Visit | Attending: Radiation Oncology | Admitting: Radiation Oncology

## 2019-11-30 ENCOUNTER — Other Ambulatory Visit: Payer: Self-pay

## 2019-11-30 DIAGNOSIS — Z510 Encounter for antineoplastic radiation therapy: Secondary | ICD-10-CM | POA: Diagnosis not present

## 2019-11-30 NOTE — Progress Notes (Signed)
Nutrition Follow-up:   Patient with SCC of left hard/soft palate.  Patient receiving radiation.  Met with patient and daughter after radiation.  Patient reports burning/tingling in mouth with eating.  Water does not burn.  Reports that he tried to eat pinto beans yesterday and unable to eat them due to burning and feels like food knots up in mouth.  Ate zebra cake yesterday, cottage cheese and peaches. Drinking 3 ensure enlive per day.      Medications: reviewed  Labs: reviewed  Anthropometrics:   Weight checked in RD office 157 lb  158 lb per Aria on 10/18 158 lb on 10/11 UBW of 160-165 lb   NUTRITION DIAGNOSIS: Predicted suboptimal energy intake continues   INTERVENTION:  Complimentary case of ensure enlive given to patient today. Encouraged him to drink 4 shakes per day (1400 calories and 80 g protein) Discussed ways to moisten foods for ease of swallowing.  Recipe booklet given to daughter.       MONITORING, EVALUATION, GOAL: weight trends, intake   NEXT VISIT: Tuesday, Nov 2 after radiation  Jocelyn Lowery B. Zenia Resides, [place], [place] Registered Dietitian [phone] (mobile)

## 2019-12-01 ENCOUNTER — Ambulatory Visit
Admission: RE | Admit: 2019-12-01 | Discharge: 2019-12-01 | Disposition: A | Discharge status: Home / Self Care | Payer: Medicare Other | Source: Ambulatory Visit | Attending: Radiation Oncology | Admitting: Radiation Oncology

## 2019-12-01 DIAGNOSIS — Z510 Encounter for antineoplastic radiation therapy: Secondary | ICD-10-CM | POA: Diagnosis not present

## 2019-12-02 ENCOUNTER — Ambulatory Visit
Admission: RE | Admit: 2019-12-02 | Discharge: 2019-12-02 | Disposition: A | Discharge status: Home / Self Care | Payer: Medicare Other | Source: Ambulatory Visit | Attending: Radiation Oncology | Admitting: Radiation Oncology

## 2019-12-02 DIAGNOSIS — Z510 Encounter for antineoplastic radiation therapy: Secondary | ICD-10-CM | POA: Diagnosis not present

## 2019-12-03 ENCOUNTER — Ambulatory Visit
Admission: RE | Admit: 2019-12-03 | Discharge: 2019-12-03 | Disposition: A | Discharge status: Home / Self Care | Payer: Medicare Other | Source: Ambulatory Visit | Attending: Radiation Oncology | Admitting: Radiation Oncology

## 2019-12-03 DIAGNOSIS — Z510 Encounter for antineoplastic radiation therapy: Secondary | ICD-10-CM | POA: Diagnosis not present

## 2019-12-06 ENCOUNTER — Ambulatory Visit
Admission: RE | Admit: 2019-12-06 | Discharge: 2019-12-06 | Disposition: A | Discharge status: Home / Self Care | Payer: Medicare Other | Source: Ambulatory Visit | Attending: Radiation Oncology | Admitting: Radiation Oncology

## 2019-12-06 ENCOUNTER — Other Ambulatory Visit: Payer: Self-pay

## 2019-12-06 DIAGNOSIS — C059 Malignant neoplasm of palate, unspecified: Secondary | ICD-10-CM | POA: Insufficient documentation

## 2019-12-06 MED ORDER — SONAFINE EX EMUL
1.0000 "application " | Freq: Two times a day (BID) | CUTANEOUS | Status: DC
  Administered 2019-12-06: 1 via TOPICAL

## 2019-12-06 NOTE — Progress Notes (Signed)
Oncology Nurse Navigator Documentation  Met with Lonnie Schaefer and his daughter after his RT today to offer support and to celebrate end of radiation treatment.  He will complete radiation tomorrow.  Provided verbal/written post-RT guidance:  Importance of keeping all follow-up appts, especially those with Nutrition and SLP.  Importance of protecting treatment area from sun.  Continuation of Sonafine application 2-3 times daily, application of antibiotic ointment to areas of raw skin; when supply of Sonafine exhausted transition to OTC lotion with vitamin E. Provided/reviewed Epic calendar of upcoming appts. Explained my role as navigator will continue for several more months, encouraged him to call me with needs/concerns.    Harlow Asa RN, BSN, OCN Head & Neck Oncology Nurse [place] [facility] Phone # [phone]  Fax # [phone]

## 2019-12-07 ENCOUNTER — Other Ambulatory Visit: Payer: Self-pay

## 2019-12-07 ENCOUNTER — Ambulatory Visit
Admission: RE | Admit: 2019-12-07 | Discharge: 2019-12-07 | Disposition: A | Discharge status: Home / Self Care | Payer: Medicare Other | Source: Ambulatory Visit | Attending: Radiation Oncology | Admitting: Radiation Oncology

## 2019-12-07 ENCOUNTER — Inpatient Hospital Stay: Payer: Medicare Other | Attending: Radiation Oncology | Admitting: Nutrition

## 2019-12-07 ENCOUNTER — Encounter: Payer: Self-pay | Admitting: Radiation Oncology

## 2019-12-07 DIAGNOSIS — C059 Malignant neoplasm of palate, unspecified: Secondary | ICD-10-CM | POA: Diagnosis not present

## 2019-12-07 NOTE — Progress Notes (Signed)
Nutrition follow-up completed with patient who has completed his final radiation therapy today for left hard/soft palate cancer. Last weight documented was 155 pounds 6 ounces on November 1. Patient reports difficulty swallowing secondary to dry mouth. He still has sensitivity to acidic foods. Reports he is now drinking 4 Ensure Enlive every day. He denies nausea, vomiting, constipation, and diarrhea.  Nutrition diagnosis: Predicted suboptimal energy intake continues.  Intervention: Continue 4 Ensure Enlive daily to provide 1400 cal and 80 g protein. Continue soft moist foods and follow bites with water as needed. Provided an additional complementary case of Ensure Enlive as well as coupons.  Monitoring, evaluation, goals: Patient will tolerate adequate calories and protein to minimize weight loss and promote healing.  Next visit: Phone follow-up on November 16 with Joli.  **Disclaimer: This note was dictated with voice recognition software. Similar sounding words can inadvertently be transcribed and this note may contain transcription errors which may not have been corrected upon publication of note.**

## 2019-12-21 ENCOUNTER — Inpatient Hospital Stay: Payer: Medicare Other

## 2019-12-21 NOTE — Progress Notes (Signed)
Nutrition Follow-up:  Patient with left hard/soft palate cancer.  Completed radiation on 12/07/2019.   Spoke with patient via phone for nutrition follow-up.  Patient reports that appetite is getting better. Taste buds are gradually coming back. Can taste flavors such as chocolate and strawberry.  Drinking 3-4 ensure enlive each day.  Reports no trouble swallowing, some dry mouth (continues rinses biotene and salt and baking soda).  Bowels moving normally.      Medications: reviewed  Labs: no new  Anthropometrics:   No new weight.  Patient says that he has lost 8 lb since the start of treatment   NUTRITION DIAGNOSIS: Predicted suboptimal energy intake continues   INTERVENTION:  Encouraged patient to continue ensure enlive 4 daily. Will leave complimentary case in Support Services waiting area for patient to pick up on Friday, 11/19 at next appointment. Encouraged patient to continue high calorie, high protein foods to maintain weight    MONITORING, EVALUATION, GOAL: weight trends, intake   NEXT VISIT: Dec 14 phone f/u  Lonnie Schaefer, [place], [place] Registered Dietitian [phone] (mobile)

## 2019-12-24 ENCOUNTER — Ambulatory Visit
Admission: RE | Admit: 2019-12-24 | Discharge: 2019-12-24 | Disposition: A | Discharge status: Home / Self Care | Payer: Medicare Other | Source: Ambulatory Visit | Attending: Radiation Oncology | Admitting: Radiation Oncology

## 2019-12-24 ENCOUNTER — Other Ambulatory Visit: Payer: Self-pay

## 2019-12-24 ENCOUNTER — Ambulatory Visit: Payer: Medicare Other | Attending: Radiation Oncology

## 2019-12-24 VITALS — BP 117/62 | HR 82 | Temp 97.6°F | Resp 20 | Ht 70.0 in | Wt 154.6 lb

## 2019-12-24 DIAGNOSIS — R1310 Dysphagia, unspecified: Secondary | ICD-10-CM | POA: Diagnosis present

## 2019-12-24 DIAGNOSIS — C050 Malignant neoplasm of hard palate: Secondary | ICD-10-CM

## 2019-12-24 DIAGNOSIS — C058 Malignant neoplasm of overlapping sites of palate: Secondary | ICD-10-CM | POA: Insufficient documentation

## 2019-12-24 DIAGNOSIS — Z923 Personal history of irradiation: Secondary | ICD-10-CM | POA: Diagnosis not present

## 2019-12-24 DIAGNOSIS — Z79899 Other long term (current) drug therapy: Secondary | ICD-10-CM | POA: Insufficient documentation

## 2019-12-24 DIAGNOSIS — C059 Malignant neoplasm of palate, unspecified: Secondary | ICD-10-CM

## 2019-12-24 NOTE — Progress Notes (Signed)
Lonnie Schaefer presents today for follow-up after completing radiation to his palate on 12/07/2019   Pain issues, if any: Still has a small sore spot on the left side of his tongue, otherwise no issues/complaints Using a feeding tube?: N/A Weight changes, if any:  Wt Readings from Last 3 Encounters:  12/24/19 154 lb 9.6 oz (70.1 kg)  11/30/19 157 lb (71.2 kg)  10/26/19 160 lb 3.2 oz (72.7 kg)   Swallowing issues, if any: Improving every day. He still eats predominately soft foods with sauce/gravy, or supplements like Ensure, but he reports his appetite seems to be returning, and he has done well with trialing solids (ex had crab, slaw, hushpuppies for dinenr the other night). 12/21/2019 Spoke with [name] via phone: "Patient reports that appetite is getting better. Taste buds are gradually coming back. Can taste flavors such as chocolate and strawberry.  Drinking 3-4 ensure enlive each day.  Reports no trouble swallowing, some dry mouth (continues rinses biotene and salt and baking soda).  Bowels moving normally." Smoking or chewing tobacco? None Using fluoride trays daily? N/A Last ENT visit was on: Not since before diagnosis (saw Dr. [name]) Other notable issues, if any:  Reports occasional soremess behind ear first thing in the morning, but states it resolves as the day progresses. Denies any swelling under jaw or neck. Continues to deal with dry mouth and thick saliva, but is managing with biotine and frequent sips of water. Denies any fatigue and reports he sleeps well at night. Overall he feels well and is pleased with his progress thus far since completing radiation   Vitals:   12/24/19 1431  BP: 117/62  Pulse: 82  Resp: 20  Temp: 97.6 F (36.4 C)  SpO2: 96%

## 2019-12-24 NOTE — Progress Notes (Signed)
Radiation Oncology         [phone] ________________________________  Name: Lonnie Schaefer MRN: [id number]  Date: 12/24/2019  DOB: [birth year]  Follow-Up Visit Note  CC: [name], FNP  [name], MD  Diagnosis and Prior Radiotherapy:       ICD-10-CM   1. 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   Mr. Pickup presents today for follow-up after completing radiation to his palate on 12/07/2019   Pain issues, if any: Still has a small sore spot on the left side of his tongue, otherwise no issues/complaints Using a feeding tube?: N/A Weight changes, if any:  Wt Readings from Last 3 Encounters:  12/24/19 154 lb 9.6 oz (70.1 kg)  11/30/19 157 lb (71.2 kg)  10/26/19 160 lb 3.2 oz (72.7 kg)   Swallowing issues, if any: Improving every day. He still eats predominately soft foods with sauce/gravy, or supplements like Ensure, but he reports his appetite seems to be returning, and he has done well with trialing solids (ex had crab, slaw, hushpuppies for dinenr the other night). 12/21/2019 Spoke with [name] via phone: "Patient reports that appetite is getting better. Taste buds are gradually coming back. Can taste flavors such as chocolate and strawberry.  Drinking 3-4 ensure enlive each day.  Reports no trouble swallowing, some dry  mouth (continues rinses biotene and salt and baking soda).  Bowels moving normally." Smoking or chewing tobacco? None Using fluoride trays daily? N/A Last ENT visit was on: Not since before diagnosis (saw Dr. [name]) Other notable issues, if any:  Reports occasional soremess behind ear first thing in the morning, but states it resolves as the day progresses. Denies any swelling under jaw or neck. Continues to deal with dry mouth and thick saliva, but is managing with biotine and frequent sips of water. Denies any fatigue and reports he sleeps well at night. Overall he feels well and is pleased with his progress thus far since completing radiation   Vitals:   12/24/19 1431  BP: 117/62  Pulse: 82  Resp: 20  Temp: 97.6 F (36.4 C)  SpO2: 96%                       ALLERGIES:  has no allergies on file.  Meds: Current Outpatient Medications  Medication Sig Dispense Refill  . albuterol (PROVENTIL HFA;VENTOLIN HFA) 108 (90 BASE) MCG/ACT inhaler Inhale 1 puff into the lungs every 6 (six) hours as needed for wheezing.  (Patient not taking: Reported on 10/26/2019)    . alendronate (FOSAMAX) 70 MG tablet Take 70 mg by mouth every Wednesday.  0  . aspirin EC 81 MG tablet Take 81 mg by mouth daily. Swallow whole.    . clobetasol ointment (TEMOVATE) 2.59 % Apply 1 application topically  2 (two) times daily as needed (rash). (Patient not taking: Reported on 10/26/2019)    . HYDROcodone-acetaminophen (NORCO) 7.5-325 MG tablet Take 1 tablet by mouth every 6 (six) hours as needed for moderate pain. (Patient not taking: Reported on 10/26/2019) 20 tablet 0  . lactose free nutrition (BOOST) LIQD Take 237 mLs by mouth daily.    Marland Kitchen lidocaine (XYLOCAINE) 2 % solution Patient: Mix 1part 2% viscous lidocaine, 1part H20. Swish and spit 57mL of diluted mixture, 48min before meals and at bedtime, up to 8 times daily. If swallowed, do not exceed 4 times daily. 250 mL 4  . mupirocin cream (BACTROBAN) 2 % Apply topically  2 (two) times daily. Apply to rash on the back (Patient not taking: Reported on 10/26/2019) 15 g 0  . OXYGEN Inhale 2 L into the lungs at bedtime.    . polyethylene glycol (MIRALAX / GLYCOLAX) 17 g packet Take 17 g by mouth 2 (two) times daily. (Patient taking differently: Take 17 g by mouth daily with lunch. ) 14 each 0  . tiotropium (SPIRIVA) 18 MCG inhalation capsule Place 18 mcg into inhaler and inhale daily.     No current facility-administered medications for this encounter.    Physical Findings: The patient is in no acute distress. Patient is alert and oriented. Wt Readings from Last 3 Encounters:  12/24/19 154 lb 9.6 oz (70.1 kg)  11/30/19 157 lb (71.2 kg)  10/26/19 160 lb 3.2 oz (72.7 kg)    height is 5\' 10"  (1.778 m) and weight is 154 lb 9.6 oz (70.1 kg). His temperature is 97.6 F (36.4 C). His blood pressure is 117/62 and his pulse is 82. His respiration is 20 and oxygen saturation is 96%. .  General: Alert and oriented, in no acute distress HEENT: Edentulous.  Head is normocephalic. Extraocular movements are intact. Oropharynx is notable for minimal mucositis resolving in the posterior left palate/tongue; no thrush Neck: Neck is notable for no palpable adenopathy Skin: Skin in treatment fields shows satisfactory healing  Psychiatric: Judgment and insight are intact. Affect is appropriate.   Lab Findings: Lab Results  Component Value Date   WBC 9.7 09/30/2019   HGB 13.0 09/30/2019   HCT 40.3 09/30/2019   MCV 97.3 09/30/2019   PLT 316 09/30/2019    Lab Results  Component Value Date   TSH 4.782 (H) 11/03/2019    Radiographic Findings: No results found.  Impression/Plan:    1) Head and Neck Cancer Status: Healing well from radiation therapy  2) Nutritional Status: Doing well with oral intake  3) Risk Factors: The patient has been educated about risk factors including alcohol and tobacco abuse; they understand that avoidance of alcohol and tobacco is important  to prevent recurrences as well as other cancers  4) Swallowing: Good function  5) Dental: He is edentulous  6) Thyroid function: Free T4 was 0.83 prior to radiation therapy.  Continue to check annually Lab Results  Component Value Date   TSH 4.782 (H) 11/03/2019    7)  Follow-up in approximately 3 months with restaging CT of neck and chest with contrast.  Our navigator will arrange a follow-up with Dr. Constance Holster in January to bridge the gap between now and then.  The patient was encouraged to call with any issues or questions before then.  On date of service, in total, I spent 20 minutes on this encounter. Patient was seen in person. _____________________________________   Eppie Gibson, MD

## 2019-12-24 NOTE — Therapy (Signed)
[facility] [address] [place], [place], [address] Phone: [phone]   Fax:  [phone]  Speech Language Pathology Treatment  Patient Details  Name: Lonnie Schaefer MRN: [id number] Date of Birth: [birth year] Referring Provider (SLP): Eppie Gibson, MD   Encounter Date: 12/24/2019   End of Session - 12/24/19 1017    Visit Number 2    Number of Visits 7    Date for SLP Re-Evaluation 02/15/20    SLP Start Time 0933    SLP Stop Time  0959    SLP Time Calculation (min) 26 min    Activity Tolerance Patient tolerated treatment well           Past Medical History:  Diagnosis Date  . Bowel obstruction ([place]) 06/2009-10/01/2013 X 5; 04/03/2016  . COPD (chronic obstructive pulmonary disease) ([place])   . H/O hiatal hernia   . History of stomach ulcers ~ 1960  . Hypoxia    idiopathic sleep related hypoxia  . Impaired fasting glucose   . On home oxygen therapy    "2L q hs" (04/03/2016)  . Oral cancer ([place])   . Osteoporosis   . Pneumonia 1990s X 1  . SBO (small bowel obstruction) ([place]) 04/21/2017  . Shortness of breath     Past Surgical History:  Procedure Laterality Date  . ABDOMINAL EXPLORATION SURGERY  06/2009; 10/2009  . ABDOMINAL SURGERY    . APPENDECTOMY  10/2009  . CHOLECYSTECTOMY N/A 09/06/2016   Procedure: LAPAROSCOPIC CHOLECYSTECTOMY WITH  INTRAOPERATIVE CHOLANGIOGRAM;  Surgeon: Ralene Ok, MD;  Location: [facility];  Service: General;  Laterality: N/A;  . COLONOSCOPY    . ESOPHAGOGASTRODUODENOSCOPY     Dr Oletta Lamas   . GUM SURGERY    . GUM SURGERY     upper gum surgery, cancer   . HERNIA REPAIR     "naval"  . LESION REMOVAL Left 10/04/2019   Procedure: ORAL LESION REMOVAL W/BIOPSY;  Surgeon: [name], MD;  Location: [facility];  Service: ENT;  Laterality: Left;  . 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 UMBILICAL HERNIA REPAIR  06/2009   w/mesh    There were no vitals filed for this  visit.   Subjective Assessment - 12/24/19 0936    Subjective Pt had hush puppies, slaw, deviled crab last night without issues.    Patient is accompained by: --   daughter   Currently in Pain? No/denies                 ADULT SLP TREATMENT - 12/24/19 0938      General Information   Behavior/Cognition Alert;Cooperative;Pleasant mood      Treatment Provided   Treatment provided Dysphagia      Dysphagia Treatment   Temperature Spikes Noted No    Respiratory Status Room air    Treatment Methods Skilled observation;Patient/caregiver education;Therapeutic exercise    Patient observed directly with PO's Yes    Type of PO's observed Regular;Thin liquids    Oral Phase Signs & Symptoms --   reports xerostomia but uses water- no oral residue noted   Pharyngeal Phase Signs & Symptoms Other (comment)   none   Other treatment/comments "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 Pt told SLP why he has to cont  to perform HEP. SLP reinforced pt needs to cont to perfom HEP 6 days a week until 06-05-20. Pt was independent with HEP.Pt told SLP benefits of a food journal.  He endorsed using more water than prior to rad tx, and that he may have xerostomia to some degree for a while. SLP confirmed this.      Assessment / Recommendations / Plan   Plan Continue with current plan of care   possible decr to x1/8 weeks next session     Progression Toward Goals   Progression toward goals Progressing toward goals            SLP Education - 12/24/19 1016    Education Details food journal    Person(s) Educated Patient;Child(ren)    Methods Explanation    Comprehension Verbalized understanding          SLP SHORT TERM GOAL #1       Title pt will complete HEP with rare min A       Time 2       Period --   sessions, for all STGs      Status New                SLP  SHORT TERM GOAL #2      Title pt will tell SLP why pt is completing HEP (rationale) with modified independence       Time        Status Achieved                SLP SHORT TERM GOAL #3      Title pt will describe 3 overt s/s aspiration PNA with modified independence       Time 2       Status Ongoing                SLP SHORT TERM GOAL #4      Title pt will tell SLP how a food journal could hasten return to a more normalized diet       Time        Status Achieved                               SLP Long Term Goals - 12/24/19 1010                       SLP LONG TERM GOAL #1      Title pt will complete HEP with modified independence over two visits       Time 3       Period --   sessions, for all LTGs      Status Ongoing               SLP LONG TERM GOAL #2      Title pt will describe how to modify HEP over time, and the timeline associated with reduction in HEP frequency with modified independence over two sessions       Time 5       Status Ongoing        Plan - 12/24/19 1010     Clinical Impression Statement At this time pt swallowing is deemed WNL/WFL with regular solids and thin liquids. SLP reviewed individualized HEP for dysphagia and pt completed each exercise on their own without cues. There are no overt s/s aspiration reported by pt at this time. Data indicate  that pt's swallow ability will likely decrease over the course of radiation therapy and could very well decline over time following conclusion of their radiation therapy due muscle fibrosis. Pt will cont to need to be seen by SLP in order to assess safety of PO intake, assess the need for recommending any objective swallow assessment, and ensuring pt correctly completes the individualized HEP. Possible decr to once every 8 weeks next session.     Speech Therapy Frequency --   once every approx 4 weeks     Duration --   7 total visits     Treatment/Interventions Aspiration precaution training;Pharyngeal  strengthening exercises;Trials of upgraded texture/liquids;Diet toleration management by SLP;Internal/external aids;Patient/family education;SLP instruction and feedback      Patient will benefit from skilled therapeutic intervention in order to improve the following deficits and impairments:   Dysphagia, unspecified type    Problem List Patient Active Problem List   Diagnosis Date Noted  . Carcinoma of hard palate ([facility]) 10/26/2019  . 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 Supplemental oxygen dependent 08/09/2019  . Lactic acidosis 08/05/2019  . 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[facility]) 08/05/2019  . Acute abdominal pain   . Acute cholecystitis 09/05/2016  . Leukocytosis 04/03/2016  . SBO (small bowel obstruction) ([facility]) 04/03/2016  . Hyperbilirubinemia 04/03/2016  . AKI (acute kidney injury) ([facility]) 04/03/2016  . Nausea, vomiting and diarrhea 10/01/2013  . COPD (chronic obstructive pulmonary disease) ([facility]) 02/23/2007    [facility] ,[facility], [place]  12/24/2019, 10:18 AM  [facility] [address] [place], [place], [address] Phone: [phone]   Fax:  [phone]   Name: Lonnie Schaefer MRN: [id number] Date of Birth: 01/12/1941

## 2019-12-24 NOTE — Progress Notes (Signed)
ct 

## 2020-01-03 ENCOUNTER — Other Ambulatory Visit: Payer: Self-pay

## 2020-01-03 ENCOUNTER — Encounter: Payer: Self-pay | Admitting: Pulmonary Disease

## 2020-01-03 ENCOUNTER — Ambulatory Visit: Payer: Medicare Other | Admitting: Pulmonary Disease

## 2020-01-03 VITALS — BP 130/78 | HR 78 | Temp 97.3°F | Ht 70.0 in | Wt 153.8 lb

## 2020-01-03 DIAGNOSIS — J449 Chronic obstructive pulmonary disease, unspecified: Secondary | ICD-10-CM

## 2020-01-03 DIAGNOSIS — C050 Malignant neoplasm of hard palate: Secondary | ICD-10-CM

## 2020-01-03 DIAGNOSIS — J439 Emphysema, unspecified: Secondary | ICD-10-CM

## 2020-01-03 DIAGNOSIS — G4736 Sleep related hypoventilation in conditions classified elsewhere: Secondary | ICD-10-CM | POA: Diagnosis not present

## 2020-01-03 DIAGNOSIS — J4489 Other specified chronic obstructive pulmonary disease: Secondary | ICD-10-CM

## 2020-01-03 MED ORDER — STIOLTO RESPIMAT 2.5-2.5 MCG/ACT IN AERS: 2.0000 | INHALATION_SPRAY | Freq: Every day | RESPIRATORY_TRACT | 0 refills | Status: DC

## 2020-01-03 NOTE — Progress Notes (Signed)
Subjective:    Patient ID: Lonnie Schaefer, male    DOB: 16-Dec-1940, 79 y.o.   MRN: [id number]  HPI Lonnie Schaefer is a 79 year old former smoker (quit 2001) who presents for follow-up on the issue of COPD.  Previously he was seen at [facility] by Dr. Christinia Gully.  He is currently maintained on Spiriva.  He was initially evaluated here on XXX for preoperative evaluation prior to excision cancer in the hard palate.  He underwent biopsy and partial removal on 04 October 2019 by Dr. Arville Care.  He pleaded radiation to his palate on 07 December 2019.  He did not have any significant difficulties with that treatment.  With regards to his dyspnea he continues to be able to do his gardening and chores around the house.  He states he "paces himself".  He is on nocturnal oxygen.  Previously did not qualify for oxygen during ambulation.  Review of Systems A 10 point review of systems was performed and it is as noted above otherwise negative.  Patient Active Problem List   Diagnosis Date Noted  . Carcinoma of hard palate ([place]) 10/26/2019  . 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 Supplemental oxygen dependent 08/09/2019  . Lactic acidosis 08/05/2019  . 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[facility]) 08/05/2019  . Acute abdominal pain   . Acute cholecystitis 09/05/2016  . Leukocytosis 04/03/2016  . SBO (small bowel obstruction) ([facility]) 04/03/2016  . Hyperbilirubinemia 04/03/2016  . AKI (acute kidney injury) ([facility]) 04/03/2016  . Nausea, vomiting and diarrhea 10/01/2013  . COPD (chronic obstructive pulmonary disease) ([place]) 02/23/2007   Not on File  Current Meds  Medication Sig  . albuterol (PROVENTIL HFA;VENTOLIN HFA) 108 (90 BASE) MCG/ACT inhaler Inhale 1 puff into the lungs every 6 (six) hours as needed for wheezing.   Marland Kitchen alendronate (FOSAMAX) 70 MG tablet Take 70 mg by mouth every Wednesday.  Marland Kitchen aspirin EC 81 MG tablet Take 81 mg by mouth daily. Swallow whole.    . clobetasol ointment (TEMOVATE) 2.33 % Apply 1 application topically 2 (two) times daily as needed (rash).   Marland Kitchen HYDROcodone-acetaminophen (NORCO) 7.5-325 MG tablet Take 1 tablet by mouth every 6 (six) hours as needed for moderate pain.  Marland Kitchen lactose free nutrition (BOOST) LIQD Take 237 mLs by mouth daily.  Marland Kitchen lidocaine (XYLOCAINE) 2 % solution Patient: Mix 1part 2% viscous lidocaine, 1part H20. Swish and spit 69mL of diluted mixture, 48min before meals and at bedtime, up to 8 times daily. If swallowed, do not exceed 4 times daily.  . mupirocin cream (BACTROBAN) 2 % Apply topically 2 (two) times daily. Apply to rash on the back  . OXYGEN Inhale 2 L into the lungs at bedtime.  . polyethylene glycol (MIRALAX / GLYCOLAX) 17 g packet Take 17 g by mouth 2 (two) times daily. (Patient taking differently: Take 17 g by mouth daily with lunch. )  . tiotropium (SPIRIVA) 18 MCG inhalation capsule Place 18 mcg into inhaler and inhale daily.   Immunization History  Administered Date(s) Administered  . Influenza Split 04/05/2009, 01/07/2012  . Influenza, High Dose Seasonal PF 11/10/2014, 11/13/2016, 11/04/2017, 11/14/2018  . Influenza, Seasonal, Injecte, Preservative Fre 10/13/2013  . Influenza-Unspecified 12/05/2010, 11/16/2019  . Moderna SARS-COVID-2 Vaccination 03/19/2019, 04/16/2019, 12/24/2019  . Pneumococcal Conjugate-13 04/07/2007, 08/04/2013  . Pneumococcal Polysaccharide-23 07/06/2010  . Tdap 04/07/2006, 11/13/2016  . Zoster 05/06/2009  . Zoster Recombinat (Shingrix) 12/17/2018, 02/18/2019       Objective:  Physical Exam BP 130/78 (BP Location: Left Arm, Cuff Size: Normal)   Pulse 78   Temp (!) 97.3 F (36.3 C) (Temporal)   Ht 5\' 10"  (1.778 m)   Wt 153 lb 12.8 oz (69.8 kg)   BMI 22.07 kg/m  GENERAL: Very slender gentleman, no acute distress.  Fully ambulatory. HEAD: Normocephalic, atraumatic.  EYES: Pupils equal, round, reactive to light.  No scleral icterus.  MOUTH: Nose/mouth/throat not  examined due to masking requirements for COVID 19. NECK: Supple. No thyromegaly. Trachea midline. No JVD.  No adenopathy. PULMONARY: Good air entry bilaterally, moving air well.  Coarse breath sounds with no other adventitious sounds. CARDIOVASCULAR: S1 and S2. Regular rate and rhythm.  No rubs, murmurs or gallops heard. ABDOMEN: Scaphoid, benign. MUSCULOSKELETAL: No joint deformity, no clubbing, no edema.  NEUROLOGIC: No focal deficit, speech fluent. SKIN: Intact,warm,dry.  Limited exam: No rashes. PSYCH: Jovial mood, normal behavior     Assessment & Plan:     ICD-10-CM   1. COPD with chronic bronchitis and emphysema ([facility])  J44.9    He appears fairly well compensated but not optimized Trial of Stiolto 2 inhalations daily Patient showed how to use the inhaler device   2. 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  3. 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    This issue adds complexity to his management   Meds ordered this encounter  Medications  . Tiotropium Bromide-Olodaterol (STIOLTO RESPIMAT) 2.5-2.5 MCG/ACT AERS    Sig: Inhale 2 puffs into the lungs daily.    Dispense:  2 each    Refill:  0    Order Specific Question:   Lot Number?    Answer:   [id number]    Order Specific Question:   Expiration Date?    Answer:   09/04/2021    Order Specific Question:   Quantity    Answer:   2   Discussion:  The patient appears fairly well compensated he does note that his issues with dyspnea could be her controlled than they are at present.  We will give him a trial of Stiolto 2 inhalations daily.  Patient was shown how to load the inhaler and how to use it properly.  Renold Don, MD [facility]  *This note was dictated using voice recognition software/Dragon.  Despite best efforts to proofread, errors can occur which can change the meaning.  Any change was purely unintentional.

## 2020-01-03 NOTE — Patient Instructions (Signed)
We are giving her a trial of Stiolto 2 puffs once a day.  This will replace Spiriva.  Let us know if this works well for you.  We will see you in follow-up in 4 months time call sooner should any new problems arise.

## 2020-01-06 ENCOUNTER — Ambulatory Visit: Payer: Medicare Other | Attending: Radiation Oncology | Admitting: Physical Therapy

## 2020-01-06 ENCOUNTER — Other Ambulatory Visit: Payer: Self-pay

## 2020-01-06 ENCOUNTER — Encounter: Payer: Self-pay | Admitting: Physical Therapy

## 2020-01-06 DIAGNOSIS — R1310 Dysphagia, unspecified: Secondary | ICD-10-CM | POA: Insufficient documentation

## 2020-01-06 DIAGNOSIS — R293 Abnormal posture: Secondary | ICD-10-CM | POA: Insufficient documentation

## 2020-01-06 NOTE — Therapy (Signed)
[place], [place], [address] Phone: [phone]   Fax:  [phone]  Physical Therapy Treatment  Patient Details  Name: Lonnie Schaefer MRN: [id number] Date of Birth: 1940/02/16 Referring Provider (PT): Reita May Date: 01/06/2020   PT End of Session - 01/06/20 1320    Visit Number 2    Number of Visits 2    Date for PT Re-Evaluation 01/06/20    PT Start Time 1302    PT Stop Time 1318    PT Time Calculation (min) 16 min    Activity Tolerance Patient tolerated treatment well    Behavior During Therapy [facility] for tasks assessed/performed           Past Medical History:  Diagnosis Date  . Bowel obstruction ([place]) 06/2009-10/01/2013 X 5; 04/03/2016  . COPD (chronic obstructive pulmonary disease) ([place])   . H/O hiatal hernia   . History of stomach ulcers ~ 1960  . Hypoxia    idiopathic sleep related hypoxia  . Impaired fasting glucose   . On home oxygen therapy    "2L q hs" (04/03/2016)  . Oral cancer ([place])   . Osteoporosis   . Pneumonia 1990s X 1  . SBO (small bowel obstruction) ([place]) 04/21/2017  . Shortness of breath     Past Surgical History:  Procedure Laterality Date  . ABDOMINAL EXPLORATION SURGERY  06/2009; 10/2009  . ABDOMINAL SURGERY    . APPENDECTOMY  10/2009  . CHOLECYSTECTOMY N/A 09/06/2016   Procedure: LAPAROSCOPIC CHOLECYSTECTOMY WITH  INTRAOPERATIVE CHOLANGIOGRAM;  Surgeon: Ralene Ok, MD;  Location: [facility];  Service: General;  Laterality: N/A;  . COLONOSCOPY    . ESOPHAGOGASTRODUODENOSCOPY     Dr Oletta Lamas   . GUM SURGERY    . GUM SURGERY     upper gum surgery, cancer   . HERNIA REPAIR     "naval"  . LESION REMOVAL Left 10/04/2019   Procedure: ORAL LESION REMOVAL W/BIOPSY;  Surgeon: [name], MD;  Location: [facility];  Service: ENT;  Laterality: Left;  . 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 UMBILICAL HERNIA REPAIR  06/2009   w/mesh     There were no vitals filed for this visit.   Subjective Assessment - 01/06/20 1304    Subjective Everybody over there said I did great with radiation all the way around.    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 No/denies    Pain Score 0-No pain              [facility] PT Assessment - 01/06/20 0001      Prior Function   Leisure pt reports he has been working out in the yard and doing leaves      Sit to [employer]	[place]   Comments 30 sit to stand: 16 reps avg is 13   pt has COPD     AROM   Cervical Flexion WFL    Cervical Extension WFL    Cervical - Right Side Bend WFL    Cervical - Left Side Bend 25%  limited    Cervical - Right Rotation [facility]    Cervical - Left Rotation [facility]  LYMPHEDEMA/ONCOLOGY QUESTIONNAIRE - 01/06/20 0001      Head and Neck   4 cm superior to sternal notch around neck 37.5 cm    6 cm superior to sternal notch around neck 37.8 cm    8 cm superior to sternal notch around neck 38 cm                                   PT Long Term Goals - 01/06/20 1321      PT LONG TERM GOAL #1   Title Pt will return to baseline ROM measurements and not demonstrate any signs of lymphedema to allow pt to return to PLOF.    Time 8    Period Weeks    Status Achieved                 Plan - 01/06/20 1324    Clinical Impression Statement Pt returned today for follow up visit after completing radiation. Pt's sit to stand repetitions have increased since eval - at eval he was able to complete 9 in 30 seconds but today was able to complete 16 in 30 seconds. Pt does not demonstrate any decrease in cervical ROM and is not currently showing any signs of lymphedema. He is remaining very active and working in his yard  frequently. Pt will be dischared from skilled PT services at this time.    PT Frequency --   eval and 1 f/u visit   PT Duration 8 weeks    PT Treatment/Interventions ADLs/Self Care Home Management;Therapeutic exercise;Patient/family education    PT Next Visit Plan d/c this visit    PT Home Exercise Plan head and neck ROM exercises    Consulted and Agree with Plan of Care Patient           Patient will benefit from skilled therapeutic intervention in order to improve the following deficits and impairments:  Decreased activity tolerance, Postural dysfunction  Visit Diagnosis: Abnormal posture     Problem List Patient Active Problem List   Diagnosis Date Noted  . 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 Supplemental oxygen dependent 08/09/2019  . Lactic acidosis 08/05/2019  . 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[place]) 08/05/2019  . Acute abdominal pain   . Acute cholecystitis 09/05/2016  . Leukocytosis 04/03/2016  . SBO (small bowel obstruction) ([place]) 04/03/2016  . Hyperbilirubinemia 04/03/2016  . AKI (acute kidney injury) ([place]) 04/03/2016  . Nausea, vomiting and diarrhea 10/01/2013  . COPD (chronic obstructive pulmonary disease) ([place]) 02/23/2007    Allyson Sabal [facility] 01/06/2020, 1:33 PM  [place], [place], [address] Phone: [phone]   Fax:  [phone]  Name: Lonnie Schaefer MRN: [id number] Date of Birth: 10/01/1940  PHYSICAL THERAPY DISCHARGE SUMMARY  Visits from Start of Care: 2  Current functional level related to goals / functional outcomes: Pt has met all goals   Remaining deficits: None   Education / Equipment: HEP for neck ROM  Plan: Patient agrees to discharge.  Patient goals were met. Patient is being discharged due to meeting the stated rehab goals.  ?????        Allyson Sabal [place], [place]  01/06/20 1:35 PM

## 2020-01-18 ENCOUNTER — Inpatient Hospital Stay: Payer: Medicare Other | Attending: Radiation Oncology

## 2020-01-18 MED ORDER — DENOSUMAB 120 MG/1.7ML ~~LOC~~ SOLN
SUBCUTANEOUS | Status: AC
  Filled 2020-01-18: qty 1.7

## 2020-01-18 MED ORDER — FULVESTRANT 250 MG/5ML IM SOLN
INTRAMUSCULAR | Status: AC
  Filled 2020-01-18: qty 10

## 2020-01-18 NOTE — Progress Notes (Signed)
Nutrition Follow-up:  Patient with left hard/soft palate cancer.  Completed radiation on 12/07/19.  Spoke with patient via phone for nutrition follow-up.  Patient reports that appetite is much better and taste is slowly coming back.  Drinks about 3 ensure plus/enlive shakes per day.  Denies pain on swallowing and reports that food is going down easier.  "I don't have to drink some much to wash it down."  Denies pain in mouth.  Yesterday ate eggs, grits, toast with jelly and coffee for breakfast. Supper was vegetable soup with crackers.  Between breakfast and supper drank 3 ensures and ate potato chips and ice cream.    Medications: reviewed  Labs: no new  Anthropometrics:   Weight 153 lb on 11/29 decreased from 155 lb on 11/1.    No new weight   NUTRITION DIAGNOSIS: Predicted suboptimal energy intake continues    INTERVENTION:  Will provide another complimentary case of ensure plus to patient and left at registration desk for patient to pick up today. Encouraged patient to continue eating high calorie, high protein foods to promote weight gain.     MONITORING, EVALUATION, GOAL: weight trends, intake   NEXT VISIT: Jan 18 phone f/u  Leonardo Makris B. Zenia Resides, [place], [place] Registered Dietitian [phone] (mobile)

## 2020-01-24 NOTE — Progress Notes (Signed)
  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   Plan: The patient will follow-up with radiation oncology in 2-3 weeks.  -----------------------------------  Eppie Gibson, MD

## 2020-01-31 ENCOUNTER — Ambulatory Visit: Payer: Medicare Other

## 2020-01-31 ENCOUNTER — Other Ambulatory Visit: Payer: Self-pay

## 2020-01-31 DIAGNOSIS — R293 Abnormal posture: Secondary | ICD-10-CM | POA: Diagnosis not present

## 2020-01-31 DIAGNOSIS — R1310 Dysphagia, unspecified: Secondary | ICD-10-CM

## 2020-01-31 NOTE — Therapy (Signed)
[place] [address] [place], [place], [address] Phone: [phone]   Fax:  [phone]  Speech Language Pathology Treatment  Patient Details  Name: Lonnie Schaefer MRN: [id number] Date of Birth: 10/28/1940 Referring Provider (SLP): Eppie Gibson, MD   Encounter Date: 01/31/2020   End of Session - 01/31/20 1018    Visit Number 3    Number of Visits 7    Date for SLP Re-Evaluation 02/15/20    SLP Start Time 0935    SLP Stop Time  [id number]    SLP Time Calculation (min) 27 min    Activity Tolerance Patient tolerated treatment well           Past Medical History:  Diagnosis Date  . Bowel obstruction ([place]) 06/2009-10/01/2013 X 5; 04/03/2016  . COPD (chronic obstructive pulmonary disease) ([place])   . H/O hiatal hernia   . History of stomach ulcers ~ 1960  . Hypoxia    idiopathic sleep related hypoxia  . Impaired fasting glucose   . On home oxygen therapy    "2L q hs" (04/03/2016)  . Oral cancer ([place])   . Osteoporosis   . Pneumonia 1990s X 1  . SBO (small bowel obstruction) ([place]) 04/21/2017  . Shortness of breath     Past Surgical History:  Procedure Laterality Date  . ABDOMINAL EXPLORATION SURGERY  06/2009; 10/2009  . ABDOMINAL SURGERY    . APPENDECTOMY  10/2009  . CHOLECYSTECTOMY N/A 09/06/2016   Procedure: LAPAROSCOPIC CHOLECYSTECTOMY WITH  INTRAOPERATIVE CHOLANGIOGRAM;  Surgeon: Ralene Ok, MD;  Location: [facility];  Service: General;  Laterality: N/A;  . COLONOSCOPY    . ESOPHAGOGASTRODUODENOSCOPY     Dr Oletta Lamas   . GUM SURGERY    . GUM SURGERY     upper gum surgery, cancer   . HERNIA REPAIR     "naval"  . LESION REMOVAL Left 10/04/2019   Procedure: ORAL LESION REMOVAL W/BIOPSY;  Surgeon: [name], MD;  Location: [facility];  Service: ENT;  Laterality: Left;  . 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 UMBILICAL HERNIA REPAIR  06/2009   w/mesh    There were no vitals filed for this  visit.   Subjective Assessment - 01/31/20 0939    Subjective "I'm at 148 lb, bout where I've been last 3-4 weeks."    Currently in Pain? No/denies                 ADULT SLP TREATMENT - 01/31/20 0940      General Information   Behavior/Cognition Alert;Cooperative;Pleasant mood      Treatment Provided   Treatment provided Dysphagia      Dysphagia Treatment   Temperature Spikes Noted No    Respiratory Status Room air    Treatment Methods Patient/caregiver education;Therapeutic exercise;Skilled observation    Patient observed directly with PO's Yes    Type of PO's observed Regular;Thin liquids    Oral Phase Signs & Symptoms --   none noted   Pharyngeal Phase Signs & Symptoms Other (comment)   none   Other treatment/comments "Let me tell you what I *didn't* have (for Christmas)!" Crackers, dinner rolls included. Denies nasal regurgitation with meals. Pt reported suboptimal completion of HEP. SLP reminded pt of BID completion with reps specified on iniital HEPhandout.Marland KitchenHEP completed with proper procedure independently. SLP reiterated to pt complete all exercises BID. Lonnie Schaefer told SLP 3 overt s/sx aspiration PNA with modified independence.  Assessment / Recommendations / Plan   Plan Continue with current plan of care      Dysphagia Recommendations   Diet recommendations --   as tolerated   Medication Administration --   as tolerated     Progression Toward Goals   Progression toward goals Progressing toward goals            SLP Education - 01/31/20 1015    Education Details overt s/s aspiration PNA    Person(s) Educated Patient;Child(ren)    Methods Explanation;Handout    Comprehension Verbalized understanding             SLP Short Term Goals - 11/16/19 1732                       SLP SHORT TERM GOAL #1      Title pt will complete HEP with rare min A       Time 2       Period --   sessions, for all STGs      Status Achieved                SLP SHORT TERM GOAL  #2      Title pt will tell SLP why pt is completing HEP (rationale) with modified independence       Time        Status Achieved               SLP SHORT TERM GOAL #3      Title pt will describe 3 overt s/s aspiration PNA with modified independence       Time        Status Achieved               SLP SHORT TERM GOAL #4      Title pt will tell SLP how a food journal could hasten return to a more normalized diet       Time       Status Deferred                                SLP Long Term Goals - 11/16/19 1734                       SLP LONG TERM GOAL #1      Title pt will complete HEP with modified independence over two visits       Time 2       Period --   sessions, for all LTGs      Status Achieved              SLP LONG TERM GOAL #2      Title pt will describe how to modify HEP over time, and the timeline associated with reduction in HEP frequency with modified independence over two sessions       Time 4       Status Ongoing        Plan: At this time pt swallowing is deemed WNL/WFL with regular solids and thin liquids. SLP reviewed individualized HEP for dysphagia and pt completed each exercise on their own without cues. There are no overt s/s aspiration reported by pt at this time. Data indicate that pt's swallow ability will likely decrease over the course of radiation therapy and could very well decline over time following conclusion of their radiation  therapy due muscle fibrosis. Pt will cont to need to be seen by SLP in order to assess safety of PO intake, assess the need for recommending any objective swallow assessment, and ensuring pt correctly completes the individualized HEP. Decr to once every 8 weeks due to progress, for 3 more sessions. Possible d/c next session.    Treatment/Interventions Aspiration precaution training;Pharyngeal strengthening exercises;Trials of upgraded texture/liquids;Diet toleration management by SLP;Internal/external aids;Patient/family  education;SLP instruction and feedback     Patient will benefit from skilled therapeutic intervention in order to improve the following deficits and impairments:   Dysphagia, unspecified type    Problem List Patient Active Problem List   Diagnosis Date Noted  . 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 Supplemental oxygen dependent 08/09/2019  . Lactic acidosis 08/05/2019  . 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[place]) 08/05/2019  . Acute abdominal pain   . Acute cholecystitis 09/05/2016  . Leukocytosis 04/03/2016  . SBO (small bowel obstruction) ([place]) 04/03/2016  . Hyperbilirubinemia 04/03/2016  . AKI (acute kidney injury) ([place]) 04/03/2016  . Nausea, vomiting and diarrhea 10/01/2013  . COPD (chronic obstructive pulmonary disease) ([place]) 02/23/2007    [facility] ,[facility], [place]  01/31/2020, 10:20 AM  [place] [address] [place], [place], [address] Phone: [phone]   Fax:  [phone]   Name: Lonnie Schaefer MRN: [id number] Date of Birth: 1940/12/21

## 2020-01-31 NOTE — Patient Instructions (Signed)
Signs of Aspiration Pneumonia   . Chest pain/tightness . Fever (can be low grade) . Cough  o With foul-smelling phlegm (sputum) o With sputum containing pus or blood o With greenish sputum . Fatigue  . Shortness of breath  . Wheezing   **IF YOU HAVE THESE SIGNS, CONTACT YOUR DOCTOR OR GO TO THE EMERGENCY DEPARTMENT OR URGENT CARE AS SOON AS POSSIBLE**      

## 2020-02-08 ENCOUNTER — Ambulatory Visit (HOSPITAL_COMMUNITY): Payer: Medicare Other | Admitting: Dentistry

## 2020-02-08 ENCOUNTER — Other Ambulatory Visit: Payer: Self-pay

## 2020-02-08 ENCOUNTER — Telehealth: Payer: Self-pay | Admitting: *Deleted

## 2020-02-08 DIAGNOSIS — C051 Malignant neoplasm of soft palate: Secondary | ICD-10-CM

## 2020-02-08 DIAGNOSIS — Z923 Personal history of irradiation: Secondary | ICD-10-CM

## 2020-02-08 NOTE — Progress Notes (Signed)
DENTAL VISIT PROGRESS NOTE   Date of Appointment:  02/08/2020 Patient Name:   Lonnie Schaefer Date of Birth:   1940-08-21 Medical Record Number: [id number]   COVID 19 SCREENING: The patient does not symptoms concerning for COVID-19 infection (Including fever, chills, cough, or new SHORTNESS OF BREATH).    . Patient presents today for an oral examination after radiation therapy for SCC of the hard/soft palate. Patient has completed 20 of 20 radiation treatments from 11/10/19 to 12/07/19.  Patient did not have chemotherapy treatments. . Reviewed medical history with the patient.  No changes reported.  VITALS: BP 116/68 (BP Location: Right Arm)   Pulse 66   Temp 98.1 F (36.7 C)    REVIEW OF CHIEF COMPLAINTS: DRY MOUTH: Yes HARD TO SWALLOW: No  HURT TO SWALLOW: No TASTE CHANGES: Taste is slowly returning SORES IN MOUTH: No TRISMUS: Occasional tightness when waking up in the morning WEIGHT: Down from 157 lbs to 148.1 lbs  HOME ORAL HYGIENE REGIMEN: BRUSHING: Pt is edentulous FLOSSING: Pt is edentulous RINSING: Uses Biotene mouthrinse as needed for dry mouth.   FLUORIDE: Pt is edentulous TRISMUS EXERCISES: Maximum interincisal opening: 50 mm   Patient Active Problem List   Diagnosis Date Noted  . 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 Supplemental oxygen dependent 08/09/2019  . Lactic acidosis 08/05/2019  . 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[facility]) 08/05/2019  . Acute abdominal pain   . Acute cholecystitis 09/05/2016  . Leukocytosis 04/03/2016  . SBO (small bowel obstruction) ([facility]) 04/03/2016  . Hyperbilirubinemia 04/03/2016  . AKI (acute kidney injury) ([facility]) 04/03/2016  . Nausea, vomiting and diarrhea 10/01/2013  . COPD (chronic obstructive pulmonary disease) ([facility]) 02/23/2007   Past Medical History:  Diagnosis Date  . Bowel obstruction ([facility]) 06/2009-10/01/2013 X 5; 04/03/2016  . COPD (chronic  obstructive pulmonary disease) ([facility])   . H/O hiatal hernia   . History of stomach ulcers ~ 1960  . Hypoxia    idiopathic sleep related hypoxia  . Impaired fasting glucose   . On home oxygen therapy    "2L q hs" (04/03/2016)  . Oral cancer ([facility])   . Osteoporosis   . Pneumonia 1990s X 1  . SBO (small bowel obstruction) ([facility]) 04/21/2017  . Shortness of breath    Past Surgical History:  Procedure Laterality Date  . ABDOMINAL EXPLORATION SURGERY  06/2009; 10/2009  . ABDOMINAL SURGERY    . APPENDECTOMY  10/2009  . CHOLECYSTECTOMY N/A 09/06/2016   Procedure: LAPAROSCOPIC CHOLECYSTECTOMY WITH  INTRAOPERATIVE CHOLANGIOGRAM;  Surgeon: Axel Filler, MD;  Location: [facility] OR;  Service: General;  Laterality: N/A;  . COLONOSCOPY    . ESOPHAGOGASTRODUODENOSCOPY     Dr Randa Evens   . GUM SURGERY    . GUM SURGERY     upper gum surgery, cancer   . HERNIA REPAIR     "naval"  . LESION REMOVAL Left 10/04/2019   Procedure: ORAL LESION REMOVAL W/BIOPSY;  Surgeon: Serena Colonel, MD;  Location: [facility] OR;  Service: ENT;  Laterality: Left;  . 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 UMBILICAL HERNIA REPAIR  06/2009   w/mesh   Current Outpatient Medications  Medication Sig Dispense Refill  . albuterol (PROVENTIL HFA;VENTOLIN HFA) 108 (90 BASE) MCG/ACT inhaler Inhale 1 puff into the lungs every 6 (six) hours as needed for wheezing.     Marland Kitchen alendronate (FOSAMAX) 70 MG tablet Take  70 mg by mouth every Wednesday.  0  . aspirin EC 81 MG tablet Take 81 mg by mouth daily. Swallow whole.    . clobetasol ointment (TEMOVATE) [id number] % Apply 1 application topically 2 (two) times daily as needed (rash).     Marland Kitchen HYDROcodone-acetaminophen (NORCO) 7.5-325 MG tablet Take 1 tablet by mouth every 6 (six) hours as needed for moderate pain. 20 tablet 0  . lactose free nutrition (BOOST) LIQD Take 237 mLs by mouth daily.    Marland Kitchen lidocaine (XYLOCAINE) 2 % solution Patient: Mix 1part 2% viscous lidocaine, 1part H20. Swish  and spit 5mL of diluted mixture, 24min before meals and at bedtime, up to 8 times daily. If swallowed, do not exceed 4 times daily. 250 mL 4  . mupirocin cream (BACTROBAN) 2 % Apply topically 2 (two) times daily. Apply to rash on the back 15 g 0  . OXYGEN Inhale 2 L into the lungs at bedtime.    . polyethylene glycol (MIRALAX / GLYCOLAX) 17 g packet Take 17 g by mouth 2 (two) times daily. (Patient taking differently: Take 17 g by mouth daily with lunch. ) 14 each 0  . tiotropium (SPIRIVA) 18 MCG inhalation capsule Place 18 mcg into inhaler and inhale daily.    . Tiotropium Bromide-Olodaterol (STIOLTO RESPIMAT) 2.5-2.5 MCG/ACT AERS Inhale 2 puffs into the lungs daily. 2 each 0   No current facility-administered medications for this visit.   Not on File   DENTAL EXAM:  Oral Hygiene: Completely edentulous maxilla and mandible. Patient reports using mouthrinse as needed w/o alcohol.   LOCATION OF MUCOSITIS: None DESCRIPTION OF SALIVA: Normal saliva and salivary flow noted upon clinical examination. 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  This usually wakes him up once or twice a night, and he then has some water and uses Biotene as needed which has been working for him.  Recommend he try CLOSYs for dry mouth if this stops working, or if he would like to try this out. 2. Dysgeusia: Patient reports that his taste almost completely returned.  He does have difficulty eating spicy foods and avoids these.  He sticks to a mostly soft food diet. 3. Weight Loss: Down from 157 lbs to 148.1 lbs.  He has worn several sets of dentures in the past, however he has not ever gotten used to using them and does not have any issues with adequate nutritional intake.  He is not interested in having any other new sets of dentures made.   RECOMMENDATIONS: 1. Brush after meals and at bedtime.  Use fluoride at  bedtime. 2. Use trismus exercises as directed. 3. Use CLOSYs or Biotene as needed for dry mouth. 4. Take multiple sips of water as needed. 5. Return to regular dentist for routine dental care including cleanings and periodic exams.  Establish care at an outside office of your choice for annual edentulous checks.  Even though you do not have teeth, it is important you see a dentist each each year to check your gums and soft tissues for infections or suspicious areas (oral cancer screening). 6. Call if any problems or concerns arise.   . All questions and concerns were addressed, and patient verbalized understanding of discussion, findings and recommendations.   . Patient tolerated today's visit well and departed in stable condition.   [place], DMD

## 2020-02-08 NOTE — Patient Instructions (Signed)
[facility] of [facility] Dr. Wyn Forster B. Chales Salmon, DMD Phone: [phone] Fax: [phone]   It was a pleasure seeing you today!  Please refer to the information below regarding your dental visit with [place], and call us should you have any questions or concerns that may come up after you leave.  One dental practice in [place], [place] you can call is [facility].  They have several practices throughout the state, and should be able to get you in for a new patient exam soon.  Give [place] a call as soon as you schedule a visit, so we can send over your information and any recent X-rays.   Thank you for giving [place] the opportunity to provide care for you.  If there is anything we can do for you, please let us know.    RECOMMENDATIONS: 1. Brush after meals and at bedtime.  Floss once a day, and use fluoride at bedtime. 2. Use trismus exercises as directed below. 3. Use CLOSYs for dry mouth, and salt water/baking soda rinses to help with any mouth sores. 4. Take multiple sips of water as needed.  Stay hydrated. 5. Return to your regular dentist for routine dental care including cleanings and periodic exams.  If you do not have a regular dentist, it is important to establish care at an outside dental office of your choice.  6. Call us if any problems or concerns arise.  TRISMUS Trismus is a condition where the jaw does not allow the mouth to open as wide as it usually does.  This can happen almost suddenly, or in other cases the process is so slow, it is hard to notice it-until it is too far along.  When the jaw joints and/or muscles have been exposed to radiation treatments, the onset of Trismus is very slow.  This is because the muscles are losing their stretching ability over a long period of time, as long as 2 YEARS after the end of radiation.  It is therefore important to exercise these muscles and joints.  TRISMUS EXERCISES:  Selina Cooley of tongue depressors measuring the same or  a little less than the last documented MIO (Maximum Interincisal Opening).  Secure them with a rubber band on both ends.  Place the stack in the patient's mouth, supporting the other end.  Allow 30 seconds for muscle stretching.  Rest for a few seconds.  Repeat 3-5 times.  For all radiation patients, this exercise is recommended in the mornings and evenings unless otherwise instructed.  The exercise should be done for a period of 2 YEARS after the end of radiation.  Maximum jaw opening should be checked routinely on recall dental visits by your general dentist.  The patient is advised to report any changes, soreness, or difficulties encountered when doing the exercises.

## 2020-02-08 NOTE — Telephone Encounter (Signed)
Called patient to inform of Stat Labs on 2 -14-22 @ 12 pm @ [facility] and his scans on 03-20-20 - arrival time- 12:45 pm @ [facility], patient to have water only - 4 hrs. prior to test, lvm for a return call

## 2020-02-22 ENCOUNTER — Inpatient Hospital Stay: Payer: Medicare Other | Attending: Radiation Oncology

## 2020-02-22 NOTE — Progress Notes (Signed)
Nutrition Follow-up:  Patient with left hard/soft palate cancer. Completed radation on 12/07/2019.   Spoke with wife for nutrition follow-up.  Reports that patient is in the bed not feeling well. Wife reports that appetite is good.  Wife reports that patient is drinking ensure shakes.  Reports that patient reports dizziness and not feeling well this am.  Denies fever.     Medications: reviewed  Labs: reviewed  Anthropometrics:   Weight 158 lb on 1/6 at [facility] increased from 153 lb on 11/29  NUTRITION DIAGNOSIS: Predicted suboptimal energy intake improved   INTERVENTION: Recommend wife/pateint touch base with PCP regarding new symptoms. Wife verbalized understanding. Continue eating high calorie, high protein foods to improve weight    NEXT VISIT: no follow-up RD available as needed  Keishawn Rajewski B. Zenia Resides, [place], [place] Registered Dietitian [phone] (mobile)

## 2020-02-24 ENCOUNTER — Other Ambulatory Visit: Payer: Self-pay

## 2020-02-24 ENCOUNTER — Emergency Department (HOSPITAL_COMMUNITY): Payer: Medicare Other

## 2020-02-24 ENCOUNTER — Emergency Department (HOSPITAL_COMMUNITY)
Admission: EM | Admit: 2020-02-24 | Discharge: 2020-02-24 | Disposition: A | Discharge status: Home / Self Care | Payer: Medicare Other | Attending: Emergency Medicine | Admitting: Emergency Medicine

## 2020-02-24 ENCOUNTER — Encounter (HOSPITAL_COMMUNITY): Payer: Self-pay

## 2020-02-24 DIAGNOSIS — Z7982 Long term (current) use of aspirin: Secondary | ICD-10-CM | POA: Insufficient documentation

## 2020-02-24 DIAGNOSIS — Z20822 Contact with and (suspected) exposure to covid-19: Secondary | ICD-10-CM | POA: Diagnosis not present

## 2020-02-24 DIAGNOSIS — M6281 Muscle weakness (generalized): Secondary | ICD-10-CM | POA: Insufficient documentation

## 2020-02-24 DIAGNOSIS — R42 Dizziness and giddiness: Secondary | ICD-10-CM | POA: Diagnosis not present

## 2020-02-24 DIAGNOSIS — Z85818 Personal history of malignant neoplasm of other sites of lip, oral cavity, and pharynx: Secondary | ICD-10-CM | POA: Insufficient documentation

## 2020-02-24 DIAGNOSIS — R0981 Nasal congestion: Secondary | ICD-10-CM | POA: Diagnosis not present

## 2020-02-24 DIAGNOSIS — R0602 Shortness of breath: Secondary | ICD-10-CM | POA: Insufficient documentation

## 2020-02-24 DIAGNOSIS — Z87891 Personal history of nicotine dependence: Secondary | ICD-10-CM | POA: Insufficient documentation

## 2020-02-24 DIAGNOSIS — J449 Chronic obstructive pulmonary disease, unspecified: Secondary | ICD-10-CM | POA: Insufficient documentation

## 2020-02-24 DIAGNOSIS — R630 Anorexia: Secondary | ICD-10-CM | POA: Insufficient documentation

## 2020-02-24 DIAGNOSIS — R059 Cough, unspecified: Secondary | ICD-10-CM | POA: Diagnosis not present

## 2020-02-24 LAB — CBC WITH DIFFERENTIAL/PLATELET
Abs Immature Granulocytes: 0.01 10*3/uL (ref 0.00–0.07)
Basophils Absolute: 0 10*3/uL (ref 0.0–0.1)
Basophils Relative: 1 %
Eosinophils Absolute: 0.2 10*3/uL (ref 0.0–0.5)
Eosinophils Relative: 3 %
HCT: 45.4 % (ref 39.0–52.0)
Hemoglobin: 15 g/dL (ref 13.0–17.0)
Immature Granulocytes: 0 %
Lymphocytes Relative: 16 %
Lymphs Abs: 0.9 10*3/uL (ref 0.7–4.0)
MCH: 31.8 pg (ref 26.0–34.0)
MCHC: 33 g/dL (ref 30.0–36.0)
MCV: 96.4 fL (ref 80.0–100.0)
Monocytes Absolute: 0.5 10*3/uL (ref 0.1–1.0)
Monocytes Relative: 10 %
Neutro Abs: 3.7 10*3/uL (ref 1.7–7.7)
Neutrophils Relative %: 70 %
Platelets: 216 10*3/uL (ref 150–400)
RBC: 4.71 MIL/uL (ref 4.22–5.81)
RDW: 12.6 % (ref 11.5–15.5)
WBC: 5.3 10*3/uL (ref 4.0–10.5)
nRBC: 0 % (ref 0.0–0.2)

## 2020-02-24 LAB — COMPREHENSIVE METABOLIC PANEL
ALT: 14 U/L (ref 0–44)
AST: 19 U/L (ref 15–41)
Albumin: 3.9 g/dL (ref 3.5–5.0)
Alkaline Phosphatase: 40 U/L (ref 38–126)
Anion gap: 10 (ref 5–15)
BUN: 24 mg/dL — ABNORMAL HIGH (ref 8–23)
CO2: 29 mmol/L (ref 22–32)
Calcium: 9.7 mg/dL (ref 8.9–10.3)
Chloride: 103 mmol/L (ref 98–111)
Creatinine, Ser: 0.98 mg/dL (ref 0.61–1.24)
GFR, Estimated: >60 mL/min (ref >60)
Glucose, Bld: 86 mg/dL (ref 70–99)
Potassium: 4.2 mmol/L (ref 3.5–5.1)
Sodium: 142 mmol/L (ref 135–145)
Total Bilirubin: 1.9 mg/dL — ABNORMAL HIGH (ref 0.3–1.2)
Total Protein: 7.4 g/dL (ref 6.5–8.1)

## 2020-02-24 LAB — I-STAT CHEM 8, ED
BUN: 22 mg/dL (ref 8–23)
Calcium, Ion: 1.24 mmol/L (ref 1.15–1.40)
Chloride: 104 mmol/L (ref 98–111)
Creatinine, Ser: 0.9 mg/dL (ref 0.61–1.24)
Glucose, Bld: 94 mg/dL (ref 70–99)
HCT: 41 % (ref 39.0–52.0)
Hemoglobin: 13.9 g/dL (ref 13.0–17.0)
Potassium: 3.9 mmol/L (ref 3.5–5.1)
Sodium: 141 mmol/L (ref 135–145)
TCO2: 29 mmol/L (ref 22–32)

## 2020-02-24 LAB — SARS CORONAVIRUS 2 (TAT 6-24 HRS): SARS Coronavirus 2: NEGATIVE

## 2020-02-24 LAB — D-DIMER, QUANTITATIVE: D-Dimer, Quant: 1 ug/mL-FEU — ABNORMAL HIGH (ref 0.00–0.50)

## 2020-02-24 LAB — LACTIC ACID, PLASMA: Lactic Acid, Venous: 1.5 mmol/L (ref 0.5–1.9)

## 2020-02-24 MED ORDER — METHYLPREDNISOLONE SODIUM SUCC 125 MG IJ SOLR
125.0000 mg | Freq: Once | INTRAMUSCULAR | Status: AC
  Administered 2020-02-24: 125 mg via INTRAVENOUS
  Filled 2020-02-24: qty 2

## 2020-02-24 MED ORDER — IOHEXOL 350 MG/ML SOLN
100.0000 mL | Freq: Once | INTRAVENOUS | Status: AC | PRN
  Administered 2020-02-24: 100 mL via INTRAVENOUS

## 2020-02-24 MED ORDER — ALBUTEROL SULFATE HFA 108 (90 BASE) MCG/ACT IN AERS
4.0000 | INHALATION_SPRAY | Freq: Once | RESPIRATORY_TRACT | Status: AC
  Administered 2020-02-24: 4 via RESPIRATORY_TRACT
  Filled 2020-02-24: qty 6.7

## 2020-02-24 MED ORDER — SODIUM CHLORIDE 0.9 % IV BOLUS
500.0000 mL | Freq: Once | INTRAVENOUS | Status: AC
  Administered 2020-02-24: 500 mL via INTRAVENOUS

## 2020-02-24 NOTE — ED Notes (Addendum)
Pt ambulated by staff in room, stated that he still felt unsteady and that his legs "felt like milk or jello." Staff asked if pt could provide urine sample, pt stated that he was unable to give sample and that he's not felt the urge to urinate since coming to the ED. RN notified.

## 2020-02-24 NOTE — ED Provider Notes (Signed)
[facility]-EMERGENCY DEPT Provider Note   CSN: [id number] Arrival date & time: 02/24/20  1018     History Chief Complaint  Patient presents with  . Weakness  . Shortness of Breath    MADOX STALLINGS is a 80 y.o. male.  Patient is a 80 year old male who presents with shortness of breath and dizziness.  He reports a 3-day history of feeling bad with some increased shortness of breath, congestion and coughing.  He has had some lightheadedness.  He describes it more of a lightheadedness when he stands up and denies any spinning sensation.  He has been feeling generally weak and feels like if he stands up to try to walk he is going to pass out.  He denies any known fevers.  No abdominal pain.  No vomiting or diarrhea.  Has had a decreased appetite with decreased oral intake.  He denies any headache.  No numbness or weakness to his extremities other than generalized weakness.  No known sick contacts.  He is fully vaccinated against COVID with 2 shots and a booster.        Past Medical History:  Diagnosis Date  . Bowel obstruction (HCC) 06/2009-10/01/2013 X 5; 04/03/2016  . COPD (chronic obstructive pulmonary disease) (HCC)   . H/O hiatal hernia   . History of stomach ulcers ~ 1960  . Hypoxia    idiopathic sleep related hypoxia  . Impaired fasting glucose   . On home oxygen therapy    "2L q hs" (04/03/2016)  . Oral cancer (HCC)   . Osteoporosis   . Pneumonia 1990s X 1  . SBO (small bowel obstruction) (HCC) 04/21/2017  . Shortness of breath     Patient Active Problem List   Diagnosis Date Noted  . 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 Supplemental oxygen dependent 08/09/2019  . Lactic acidosis 08/05/2019  . 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HCC) 08/05/2019  . Acute abdominal pain   . Acute cholecystitis 09/05/2016  . Leukocytosis 04/03/2016  . SBO (small bowel obstruction) (HCC)  04/03/2016  . Hyperbilirubinemia 04/03/2016  . AKI (acute kidney injury) (HCC) 04/03/2016  . Nausea, vomiting and diarrhea 10/01/2013  . COPD (chronic obstructive pulmonary disease) (HCC) 02/23/2007    Past Surgical History:  Procedure Laterality Date  . ABDOMINAL EXPLORATION SURGERY  06/2009; 10/2009  . ABDOMINAL SURGERY    . APPENDECTOMY  10/2009  . CHOLECYSTECTOMY N/A 09/06/2016   Procedure: LAPAROSCOPIC CHOLECYSTECTOMY WITH  INTRAOPERATIVE CHOLANGIOGRAM;  Surgeon: Axel Filler, MD;  Location: [facility] OR;  Service: General;  Laterality: N/A;  . COLONOSCOPY    . ESOPHAGOGASTRODUODENOSCOPY     Dr Randa Evens   . GUM SURGERY    . GUM SURGERY     upper gum surgery, cancer   . HERNIA REPAIR     "naval"  . LESION REMOVAL Left 10/04/2019   Procedure: ORAL LESION REMOVAL W/BIOPSY;  Surgeon: Serena Colonel, MD;  Location: [facility] OR;  Service: ENT;  Laterality: Left;  . 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 UMBILICAL HERNIA REPAIR  06/2009   w/mesh       Family History  Problem Relation Age of Onset  . Pneumonia Mother     Social History   Tobacco Use  . Smoking status: Former Smoker    Packs/day: 2.00    Years: 50.00    Pack years: 100.00  Types: Cigarettes    Quit date: 10/16/1999    Years since quitting: 20.3  . Smokeless tobacco: Never Used  Vaping Use  . Vaping Use: Never used  Substance Use Topics  . Alcohol use: Not Currently    Comment: 04/03/2016 "quit drinking 01/1987" occ use   . Drug use: No    Home Medications Prior to Admission medications   Medication Sig Start Date End Date Taking? Authorizing Provider  albuterol (PROVENTIL HFA;VENTOLIN HFA) 108 (90 BASE) MCG/ACT inhaler Inhale 1 puff into the lungs every 6 (six) hours as needed for wheezing.    Yes [provider]  alendronate (FOSAMAX) 70 MG tablet Take 70 mg by mouth every Wednesday. 03/25/16  Yes [provider]  aspirin EC 81 MG tablet Take 81 mg by mouth daily.  Swallow whole.   Yes [provider]  clobetasol ointment (TEMOVATE) 6.30 % Apply 1 application topically 2 (two) times daily as needed (rash).    Yes [provider]  lactose free nutrition (BOOST) LIQD Take 237 mLs by mouth daily.   Yes [provider]  meclizine (ANTIVERT) 12.5 MG tablet Take 12.5 mg by mouth 3 (three) times daily as needed for dizziness.   Yes [provider]  OXYGEN Inhale 2 L into the lungs at bedtime.   Yes [provider]  polyethylene glycol (MIRALAX / GLYCOLAX) 17 g packet Take 17 g by mouth 2 (two) times daily. Patient taking differently: Take 17 g by mouth daily as needed for mild constipation. 08/09/19  Yes Elodia Florence., MD  Tiotropium Bromide-Olodaterol (STIOLTO RESPIMAT) 2.5-2.5 MCG/ACT AERS Inhale 2 puffs into the lungs daily. 01/03/20  Yes Tyler Pita, MD  HYDROcodone-acetaminophen (NORCO) 7.5-325 MG tablet Take 1 tablet by mouth every 6 (six) hours as needed for moderate pain. Patient not taking: Reported on 02/24/2020 10/04/19   [name], MD  lidocaine (XYLOCAINE) 2 % solution Patient: Mix 1part 2% viscous lidocaine, 1part H20. Swish and spit 42mL of diluted mixture, 7min before meals and at bedtime, up to 8 times daily. If swallowed, do not exceed 4 times daily. Patient not taking: Reported on 02/24/2020 11/15/19   Eppie Gibson, MD  mupirocin cream (BACTROBAN) 2 % Apply topically 2 (two) times daily. Apply to rash on the back Patient not taking: Reported on 02/24/2020 04/11/16   Bonnielee Haff, MD    Allergies    Patient has no known allergies.  Review of Systems   Review of Systems  Constitutional: Positive for activity change, appetite change and fatigue. Negative for chills, diaphoresis and fever.  HENT: Positive for congestion and rhinorrhea. Negative for sneezing.   Eyes: Negative.   Respiratory: Positive for cough and shortness of breath. Negative for chest tightness.   Cardiovascular:  Negative for chest pain and leg swelling.  Gastrointestinal: Negative for abdominal pain, blood in stool, diarrhea, nausea and vomiting.  Genitourinary: Negative for difficulty urinating, flank pain, frequency and hematuria.  Musculoskeletal: Negative for arthralgias and back pain.  Skin: Negative for rash.  Neurological: Positive for dizziness, weakness (Generalized) and light-headedness. Negative for speech difficulty, numbness and headaches.    Physical Exam Updated Vital Signs BP 112/61   Pulse 64   Temp 98.7 F (37.1 C) (Rectal)   Resp (!) 23   SpO2 99%   Physical Exam Constitutional:      Appearance: He is well-developed and well-nourished.  HENT:     Head: Normocephalic and atraumatic.  Eyes:     Pupils: Pupils  are equal, round, and reactive to light.     Comments: No nystagmus  Cardiovascular:     Rate and Rhythm: Normal rate and regular rhythm.     Heart sounds: Normal heart sounds.  Pulmonary:     Effort: Pulmonary effort is normal. No respiratory distress.     Breath sounds: Normal breath sounds. No wheezing or rales.  Chest:     Chest wall: No tenderness.  Abdominal:     General: Bowel sounds are normal.     Palpations: Abdomen is soft.     Tenderness: There is no abdominal tenderness. There is no guarding or rebound.  Musculoskeletal:        General: No edema. Normal range of motion.     Cervical back: Normal range of motion and neck supple.  Lymphadenopathy:     Cervical: No cervical adenopathy.  Skin:    General: Skin is warm and dry.     Findings: No rash.  Neurological:     General: No focal deficit present.     Mental Status: He is alert and oriented to person, place, and time.  Psychiatric:        Mood and Affect: Mood and affect normal.     ED Results / Procedures / Treatments   Labs (all labs ordered are listed, but only abnormal results are displayed) Labs Reviewed  COMPREHENSIVE METABOLIC PANEL - Abnormal; Notable for the following  components:      Result Value   BUN 24 (*)    Total Bilirubin 1.9 (*)    All other components within normal limits  D-DIMER, QUANTITATIVE (NOT AT [facility]) - Abnormal; Notable for the following components:   D-Dimer, Quant 1.00 (*)    All other components within normal limits  SARS CORONAVIRUS 2 (TAT 6-24 HRS)  CBC WITH DIFFERENTIAL/PLATELET  LACTIC ACID, PLASMA  URINALYSIS, ROUTINE W REFLEX MICROSCOPIC  I-STAT CHEM 8, ED    EKG EKG Interpretation  Date/Time:  Thursday February 24 2020 10:55:47 EST Ventricular Rate:  86 PR Interval:    QRS Duration: 83 QT Interval:  341 QTC Calculation: 408 R Axis:   77 Text Interpretation: Sinus rhythm Ventricular premature complex Probable anteroseptal infarct, old Nonspecific T abnormalities, lateral leads since last tracing no significant change Confirmed by Malvin Johns [phone]) on 02/24/2020 11:53:29 AM   Radiology DG Chest 2 View  Result Date: 02/24/2020 CLINICAL DATA:  Shortness of breath. Generalized weakness and dizziness for 3 days EXAM: CHEST - 2 VIEW COMPARISON:  08/08/2019 FINDINGS: The heart size appears within normal limits. No pleural effusion or edema. No airspace densities. Visualized osseous structures are unremarkable. IMPRESSION: No active cardiopulmonary abnormalities. Electronically Signed   By: Kerby Moors M.D.   On: 02/24/2020 12:04    Procedures Procedures (including critical care time)  Medications Ordered in ED Medications  albuterol (VENTOLIN HFA) 108 (90 Base) MCG/ACT inhaler 4 puff (has no administration in time range)  sodium chloride 0.9 % bolus 500 mL (0 mLs Intravenous Stopped 02/24/20 1202)    ED Course  I have reviewed the triage vital signs and the nursing notes.  Pertinent labs & imaging results that were available during my care of the patient were reviewed by me and considered in my medical decision making (see chart for details).    MDM Rules/Calculators/A&P                          Patient  is a 80 year old male  who presents with dizziness and shortness of breath.  As far as the shortness of breath, his chest x-ray is clear.  His lungs sound grossly nonconcerning.  He has no hypoxia, however he does have tachypnea.  He was given albuterol here with no significant improvement in symptoms.  His D-dimer is slightly elevated and he is awaiting a CTA of his chest.  He also complains of dizziness which sounds to be more like lightheadedness.  He was hypotensive on arrival which has resolved with IV fluids.  He does report some worsening dizziness with head movement but he does not describe any spinning sensation.  He does not have any nystagmus or focal neurologic deficits on exam.  His labs are nonconcerning.  He is awaiting a head CT.  Dr. Roslynn Amble to take over care pending this. Final Clinical Impression(s) / ED Diagnoses Final diagnoses:  None    Rx / DC Orders ED Discharge Orders    None       Malvin Johns, MD 02/24/20 1527

## 2020-02-24 NOTE — Discharge Instructions (Addendum)
Follow-up with your primary doctor regarding your symptoms from today.  If you have any falls, worsening unsteadiness, chest pain, difficulty breathing or other new concerning symptoms, return to ER for reassessment.

## 2020-02-24 NOTE — ED Triage Notes (Signed)
Pt arrived via walk in, c/o worsening SOB and generalized weakness and dizziness x3 days. Hx of COPD, states normally wear 2L [place] at home at night, has needed to wear it during the day x2 days. Worsening wkns/diziness states he has almost fall multiple times the last couple days, but has caught himself each time. Denies any known sick contacts. Has not had much of an appetite since radiation tx in oct.

## 2020-02-24 NOTE — ED Notes (Signed)
Per Belfi MD, d/c second lactic

## 2020-02-24 NOTE — ED Provider Notes (Signed)
Signout note  80 year old male presents to ER with concern for shortness of breath, generalized weakness, dizziness.  Initially soft blood pressures but has been stable for most of visit.  Lab work grossly stable, mild elevation in D-dimer.  CT head and CTA chest ordered to further evaluate.    4:00 PM received sign out from [facility]  6:37 PM CTA chest negative for PE, CT head negative for acute intracranial pathology.  Reviewed symptoms with patient, work-up with patient and daughter at bedside.  He feels markedly improved after receiving fluids.  Suspect patient had degree of dehydration.  He endorsed some unsteadiness that was associated with sudden movements.  He has no dizziness, lightheadedness at rest but only with sudden movement.  Normal neuro exam.  Given these findings and negative CT head, low suspicion for central cause of his dizziness.  Recommend follow-up with primary doctor and discharged home.   Lucrezia Starch, MD 02/24/20 [phone]

## 2020-03-17 ENCOUNTER — Other Ambulatory Visit: Payer: Self-pay | Admitting: Radiation Oncology

## 2020-03-17 ENCOUNTER — Telehealth: Payer: Self-pay | Admitting: *Deleted

## 2020-03-17 DIAGNOSIS — C050 Malignant neoplasm of hard palate: Secondary | ICD-10-CM

## 2020-03-17 NOTE — Telephone Encounter (Signed)
Called patient to inform of STAT labs for 03-20-20 @ 12 pm @ [facility], lvm for a return call

## 2020-03-20 ENCOUNTER — Ambulatory Visit (HOSPITAL_COMMUNITY)
Admission: RE | Admit: 2020-03-20 | Discharge: 2020-03-20 | Disposition: A | Discharge status: Home / Self Care | Payer: Medicare Other | Source: Ambulatory Visit | Attending: Radiation Oncology | Admitting: Radiation Oncology

## 2020-03-20 ENCOUNTER — Other Ambulatory Visit: Payer: Self-pay

## 2020-03-20 ENCOUNTER — Encounter (HOSPITAL_COMMUNITY): Payer: Self-pay

## 2020-03-20 ENCOUNTER — Ambulatory Visit
Admission: RE | Admit: 2020-03-20 | Discharge: 2020-03-20 | Disposition: A | Discharge status: Home / Self Care | Payer: Medicare Other | Source: Ambulatory Visit | Attending: Radiation Oncology | Admitting: Radiation Oncology

## 2020-03-20 DIAGNOSIS — C050 Malignant neoplasm of hard palate: Secondary | ICD-10-CM | POA: Diagnosis present

## 2020-03-20 DIAGNOSIS — Z85819 Personal history of malignant neoplasm of unspecified site of lip, oral cavity, and pharynx: Secondary | ICD-10-CM | POA: Insufficient documentation

## 2020-03-20 LAB — CREATININE (CANCER CENTER ONLY)
Creatinine: 0.95 mg/dL (ref 0.61–1.24)
GFR, Estimated: >60 mL/min (ref >60)

## 2020-03-20 MED ORDER — IOHEXOL 300 MG/ML  SOLN
75.0000 mL | Freq: Once | INTRAMUSCULAR | Status: AC | PRN
  Administered 2020-03-20: 75 mL via INTRAVENOUS

## 2020-03-21 ENCOUNTER — Encounter: Payer: Self-pay | Admitting: Nutrition

## 2020-03-21 ENCOUNTER — Ambulatory Visit
Admission: RE | Admit: 2020-03-21 | Discharge: 2020-03-21 | Disposition: A | Discharge status: Home / Self Care | Payer: Medicare Other | Source: Ambulatory Visit | Attending: Radiation Oncology | Admitting: Radiation Oncology

## 2020-03-21 VITALS — BP 116/65 | HR 63 | Temp 98.0°F | Resp 20 | Ht 70.0 in | Wt 154.2 lb

## 2020-03-21 DIAGNOSIS — J439 Emphysema, unspecified: Secondary | ICD-10-CM | POA: Diagnosis not present

## 2020-03-21 DIAGNOSIS — R296 Repeated falls: Secondary | ICD-10-CM | POA: Diagnosis not present

## 2020-03-21 DIAGNOSIS — R911 Solitary pulmonary nodule: Secondary | ICD-10-CM | POA: Diagnosis not present

## 2020-03-21 DIAGNOSIS — Z85819 Personal history of malignant neoplasm of unspecified site of lip, oral cavity, and pharynx: Secondary | ICD-10-CM | POA: Insufficient documentation

## 2020-03-21 DIAGNOSIS — Z923 Personal history of irradiation: Secondary | ICD-10-CM | POA: Insufficient documentation

## 2020-03-21 DIAGNOSIS — Z7982 Long term (current) use of aspirin: Secondary | ICD-10-CM | POA: Diagnosis not present

## 2020-03-21 DIAGNOSIS — I6529 Occlusion and stenosis of unspecified carotid artery: Secondary | ICD-10-CM | POA: Insufficient documentation

## 2020-03-21 DIAGNOSIS — R682 Dry mouth, unspecified: Secondary | ICD-10-CM | POA: Insufficient documentation

## 2020-03-21 DIAGNOSIS — I517 Cardiomegaly: Secondary | ICD-10-CM | POA: Insufficient documentation

## 2020-03-21 DIAGNOSIS — C059 Malignant neoplasm of palate, unspecified: Secondary | ICD-10-CM

## 2020-03-21 DIAGNOSIS — C050 Malignant neoplasm of hard palate: Secondary | ICD-10-CM

## 2020-03-21 MED ORDER — SONAFINE EX EMUL
1.0000 "application " | Freq: Two times a day (BID) | CUTANEOUS | Status: DC
  Administered 2020-03-21: 1 via TOPICAL

## 2020-03-21 NOTE — Progress Notes (Signed)
Provided one complimentary case of ensure plus. 

## 2020-03-21 NOTE — Progress Notes (Signed)
Patient presents today for follow-up after completing radiation to his palate on 12/07/2019 and to review results from his CT scan on 03/20/2020  Pain issues, if any: Patient denies. Reports sore in mouth from last visit has resolved Using a feeding tube?: N/A Weight changes, if any:  Wt Readings from Last 3 Encounters:  03/21/20 154 lb 3.2 oz (69.9 kg)  01/03/20 153 lb 12.8 oz (69.8 kg)  12/24/19 154 lb 9.6 oz (70.1 kg)   Swallowing issues, if any: Patient denies. Does report that his appetite waxes and wanes. Feels he can eat and drink a wide variety of food and beverages without dificulty  Smoking or chewing tobacco? None Using fluoride trays daily? N/A Last ENT visit was on: 02/10/2020 Saw Dr. [name]:  "--Physical Exam:  Healthy-appearing gentleman in no distress. Breathing and voice are clear. No palpable adenopathy or masses in the neck. Nasal exam unremarkable. Oral cavity and pharynx are completely clear without any mucosal lesions identified. The palate site has healed over completely. There is no ulceration or mass. --Impression & Plans:  Stable, no evidence of recurrent disease. Recheck 3 months or sooner as needed. He has follow-up scan scheduled next month."  Other notable issues, if any: Patient denies any ear or jaw pain, or diffculty opening his mouth fully. Reports he sleeps well. Continues to deal with dry mouth and altered sense of taste. Denies any issues with lymphedema/swelling to neck/jaw. Overall he reports he feels well and is pleased with his recovery so far.   Vitals:   03/21/20 1423  BP: 116/65  Pulse: 63  Resp: 20  Temp: 98 F (36.7 C)  SpO2: 99%

## 2020-03-21 NOTE — Progress Notes (Signed)
Oncology Nurse Navigator Documentation  I met with Mr. Radigan and his daughter briefly before he saw Dr. Isidore Moos today for his post-treatment follow up. He is doing well and denies concerns at this time. They both know to call me if they have any further needs that I can help them with.   Harlow Asa RN, BSN, OCN Head & Neck Oncology Nurse [place] at [facility] Phone # [phone]  Fax # [phone]

## 2020-03-22 ENCOUNTER — Encounter: Payer: Self-pay | Admitting: Radiation Oncology

## 2020-03-22 NOTE — Progress Notes (Signed)
Radiation Oncology         [phone] ________________________________  Name: Lonnie Schaefer MRN: [id number]  Date: 03/21/2020  DOB: 02-Feb-1941  Follow-Up Visit Note  CC: [name], FNP  [name], MD  Diagnosis and Prior Radiotherapy:       ICD-10-CM   1. 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) Staging form: Oral Cavity, AJCC 8th Edition - Clinical stage from 10/26/2019: Stage I (cT1, cN0, cM0) - Signed by Eppie Gibson, MD on 10/27/2019 Stage prefix: Initial diagnosis Histologic grade (G): G3 Histologic grading system: 3 grade system  Carcinoma of hard palate ([place]) - No stage assigned - Unsigned   Radiation Treatment Dates: 11/10/2019 through 12/07/2019 Site Technique Total Dose (Gy) Dose per Fx (Gy) Completed Fx Beam Energies  Hard Palate: HN_L_palat IMRT 54/54 2.7 20/20 6X    CHIEF COMPLAINT:  Here for follow-up and surveillance of mouth cancer  Narrative:    Patient presents today for follow-up after completing radiation to his palate on 12/07/2019 and to review results from his CT scan on 03/20/2020  Pain issues, if any: Patient denies. Reports sore in mouth from last visit has resolved Using a feeding tube?: N/A Weight changes, if any:  Wt Readings from Last 3 Encounters:  03/21/20 154 lb 3.2 oz (69.9 kg)  01/03/20 153 lb 12.8 oz (69.8 kg)  12/24/19 154 lb 9.6 oz (70.1 kg)   Swallowing issues, if any: Patient denies. Does report that his appetite waxes and wanes. Feels he can eat and drink a wide variety of food and beverages without dificulty  Smoking or chewing tobacco? None Using fluoride trays daily? N/A Last ENT visit was on: 02/10/2020 Saw Dr. [name]:  "--Physical Exam:  Healthy-appearing gentleman in no distress. Breathing and voice are clear. No palpable adenopathy or  masses in the neck. Nasal exam unremarkable. Oral cavity and pharynx are completely clear without any mucosal lesions identified. The palate site has healed over completely. There is no ulceration or mass. --Impression & Plans:  Stable, no evidence of recurrent disease. Recheck 3 months or sooner as needed. He has follow-up scan scheduled next month."  Other notable issues, if any: Patient denies any ear or jaw pain, or diffculty opening his mouth fully. Reports he sleeps well. Continues to deal with dry mouth and altered sense of taste. Denies any issues with lymphedema/swelling to neck/jaw. Overall he reports he feels well and is pleased with his recovery so far.   Vitals:   03/21/20 1423  BP: 116/65  Pulse: 63  Resp: 20  Temp: 98 F (36.7 C)  SpO2: 99%                       ALLERGIES:  has No Known Allergies.  Meds: Current Outpatient Medications  Medication Sig Dispense Refill  . albuterol (PROVENTIL HFA;VENTOLIN HFA) 108 (90 BASE) MCG/ACT inhaler Inhale 1 puff into the lungs every 6 (six) hours as needed for wheezing.     Marland Kitchen alendronate (FOSAMAX) 70 MG tablet Take 70 mg by mouth every Wednesday.  0  . aspirin EC 81 MG tablet Take 81 mg by mouth daily. Swallow whole.    . clobetasol ointment (TEMOVATE) 7.32 % Apply 1 application topically 2 (two) times daily as needed (rash).     Marland Kitchen  HYDROcodone-acetaminophen (NORCO) 7.5-325 MG tablet Take 1 tablet by mouth every 6 (six) hours as needed for moderate pain. (Patient not taking: Reported on 02/24/2020) 20 tablet 0  . lactose free nutrition (BOOST) LIQD Take 237 mLs by mouth daily.    Marland Kitchen lidocaine (XYLOCAINE) 2 % solution Patient: Mix 1part 2% viscous lidocaine, 1part H20. Swish and spit 83mL of diluted mixture, 9min before meals and at bedtime, up to 8 times daily. If swallowed, do not exceed 4 times daily. (Patient not taking: Reported on 02/24/2020) 250 mL 4  . meclizine (ANTIVERT) 12.5 MG tablet Take 12.5 mg by mouth 3 (three) times daily  as needed for dizziness.    . mupirocin cream (BACTROBAN) 2 % Apply topically 2 (two) times daily. Apply to rash on the back (Patient not taking: Reported on 02/24/2020) 15 g 0  . OXYGEN Inhale 2 L into the lungs at bedtime.    . polyethylene glycol (MIRALAX / GLYCOLAX) 17 g packet Take 17 g by mouth 2 (two) times daily. (Patient taking differently: Take 17 g by mouth daily as needed for mild constipation.) 14 each 0  . Tiotropium Bromide-Olodaterol (STIOLTO RESPIMAT) 2.5-2.5 MCG/ACT AERS Inhale 2 puffs into the lungs daily. 2 each 0   No current facility-administered medications for this encounter.    Physical Findings: The patient is in no acute distress. Patient is alert and oriented. Wt Readings from Last 3 Encounters:  03/21/20 154 lb 3.2 oz (69.9 kg)  01/03/20 153 lb 12.8 oz (69.8 kg)  12/24/19 154 lb 9.6 oz (70.1 kg)    height is 5\' 10"  (1.778 m) and weight is 154 lb 3.2 oz (69.9 kg). His temperature is 98 F (36.7 C). His blood pressure is 116/65 and his pulse is 63. His respiration is 20 and oxygen saturation is 99%. .  General: Alert and oriented, in no acute distress HEENT: Edentulous.  Head is normocephalic. Extraocular movements are intact. Oropharynx/oral cavity without tumor; no thrush Neck: Neck is notable for no palpable adenopathy Skin: Skin in treatment fields shows satisfactory healing  Psychiatric: Judgment and insight are intact. Affect is appropriate. Heart: RRR Chest: CTAB   Lab Findings: Lab Results  Component Value Date   WBC 5.3 02/24/2020   HGB 13.9 02/24/2020   HCT 41.0 02/24/2020   MCV 96.4 02/24/2020   PLT 216 02/24/2020    Lab Results  Component Value Date   TSH 4.782 (H) 11/03/2019    Radiographic Findings: DG Chest 2 View  Result Date: 02/24/2020 CLINICAL DATA:  Shortness of breath. Generalized weakness and dizziness for 3 days EXAM: CHEST - 2 VIEW COMPARISON:  08/08/2019 FINDINGS: The heart size appears within normal limits. No pleural  effusion or edema. No airspace densities. Visualized osseous structures are unremarkable. IMPRESSION: No active cardiopulmonary abnormalities. Electronically Signed   By: Kerby Moors M.D.   On: 02/24/2020 12:04   CT Head Wo Contrast  Result Date: 02/24/2020 CLINICAL DATA:  Dizziness x3 days with multiple falls. EXAM: CT HEAD WITHOUT CONTRAST TECHNIQUE: Contiguous axial images were obtained from the base of the skull through the vertex without intravenous contrast. COMPARISON:  None. FINDINGS: Brain: There is mild cerebral atrophy with widening of the extra-axial spaces and ventricular dilatation. There are areas of decreased attenuation within the white matter tracts of the supratentorial brain, consistent with microvascular disease changes. Vascular: Vascular enhancement is seen secondary to intravenous contrast from an earlier chest CTA. Skull: Normal. Negative for fracture or focal lesion. Sinuses/Orbits: No acute finding.  Other: None. IMPRESSION: 1. Mild cerebral atrophy and microvascular disease changes of the supratentorial brain. 2. No acute intracranial abnormality. Electronically Signed   By: Virgina Norfolk M.D.   On: 02/24/2020 16:48   CT Soft Tissue Neck W Contrast  Result Date: 03/20/2020 CLINICAL DATA:  Follow-up hard palate carcinoma. Radiation therapy completed on 12/07/2019. EXAM: CT NECK WITH CONTRAST TECHNIQUE: Multidetector CT imaging of the neck was performed using the standard protocol following the bolus administration of intravenous contrast. CONTRAST:  21mL OMNIPAQUE IOHEXOL 300 MG/ML  SOLN COMPARISON:  09/03/2019 FINDINGS: Pharynx and larynx: Mild pharyngeal edema likely secondary to radiation therapy. No mass or bone destruction. Widely patent airway. No retropharyngeal fluid. Salivary glands: No inflammation, mass, or stone. Thyroid: 1.2 cm left thyroid nodule which has been previously evaluated by ultrasound and for which no imaging follow-up is recommended. Lymph nodes: No  enlarged or suspicious lymph nodes in the neck. Vascular: Major vascular structures of the neck are patent. Mild carotid atherosclerosis. Limited intracranial: Unremarkable. Visualized orbits: Unremarkable. Mastoids and visualized paranasal sinuses: Mild mucosal thickening in the left maxillary sinus. Clear mastoid air cells. Skeleton: Moderate cervical disc and facet degeneration. Edentulous. No suspicious osseous lesion. Upper chest: Reported separately. Other: None. IMPRESSION: No evidence of recurrent disease in the neck. Electronically Signed   By: Logan Bores M.D.   On: 03/20/2020 15:55   CT Chest W Contrast  Result Date: 03/20/2020 CLINICAL DATA:  Head neck cancer. Carcinoma the hard palate. Radiation therapy complete. EXAM: CT CHEST WITH CONTRAST TECHNIQUE: Multidetector CT imaging of the chest was performed during intravenous contrast administration. CONTRAST:  64mL OMNIPAQUE IOHEXOL 300 MG/ML  SOLN COMPARISON:  Neck CT same day FINDINGS: Cardiovascular: No significant vascular findings. Normal heart size. Coronary artery calcification and aortic atherosclerotic calcification. Mediastinum/Nodes: No axillary or supraclavicular adenopathy. No mediastinal or hilar adenopathy. No pericardial fluid. Esophagus normal. Lungs/Pleura: Flattened nodule along the RIGHT oblique fissure measures 7 mm (image 97/7). Subpleural nodule in the RIGHT middle lobe measures 4 mm on image 78/7) Biapical full pleuroparenchymal thickening which appears benign. Oblong nodule along the LEFT oblique fissure measures 8 mm (image 102/7) Upper Abdomen: Limited view of the liver, kidneys, pancreas are unremarkable. Normal adrenal glands. Musculoskeletal: No aggressive osseous lesion. IMPRESSION: 1. Stable ovoid nodules along the oblique fissures. Favor benign inter fissural lymph nodes or other benign nodules. 2. No new or suspicious pulmonary nodules. 3. Coronary artery calcification and aortic atherosclerotic calcification.  Electronically Signed   By: Suzy Bouchard M.D.   On: 03/20/2020 14:24   CT Angio Chest PE W/Cm &/Or Wo Cm  Result Date: 02/24/2020 CLINICAL DATA:  Worsening shortness of breath. EXAM: CT ANGIOGRAPHY CHEST WITH CONTRAST TECHNIQUE: Multidetector CT imaging of the chest was performed using the standard protocol during bolus administration of intravenous contrast. Multiplanar CT image reconstructions and MIPs were obtained to evaluate the vascular anatomy. CONTRAST:  14mL OMNIPAQUE IOHEXOL 350 MG/ML SOLN COMPARISON:  November 03, 2019 FINDINGS: Cardiovascular: There is mild calcification of the aortic arch. Satisfactory opacification of the pulmonary arteries to the segmental level. No evidence of pulmonary embolism. Normal heart size with marked severity coronary artery calcification. No pericardial effusion. Mediastinum/Nodes: No enlarged mediastinal, hilar, or axillary lymph nodes. Thyroid gland, trachea, and esophagus demonstrate no significant findings. Lungs/Pleura: There is mild to moderate severity emphysematous lung disease. Moderate severity areas of scarring and/or atelectasis are seen within the bilateral apices. Mild posterior right lower lobe atelectasis and/or infiltrate is also noted. Upper Abdomen: No acute  abnormality. Musculoskeletal: No chest wall abnormality. No acute or significant osseous findings. Review of the MIP images confirms the above findings. IMPRESSION: 1. No evidence of pulmonary embolus. 2. Mild posterior right lower lobe atelectasis and/or infiltrate. 3. Moderate severity biapical scarring and/or atelectasis are seen within the bilateral apices. 4. Emphysema and aortic atherosclerosis. Aortic Atherosclerosis (ICD10-I70.0) and Emphysema (ICD10-J43.9). Electronically Signed   By: Virgina Norfolk M.D.   On: 02/24/2020 16:42    Impression/Plan:    1) Head and Neck Cancer Status: NED based on physical exam and radiography; I personally reviewed his images.  2) Nutritional  Status: Doing well with oral intake and maintaining weight  3) Risk Factors: The patient has been educated about risk factors including alcohol and tobacco abuse; they understand that avoidance of alcohol and tobacco is important to prevent recurrences as well as other cancers  4) Swallowing: Good function  5) Dental: He is edentulous  6) Thyroid function: He denies symptoms of hypothyroidism.  We check TSH at his next follow-up  7)  Follow-up in July.  He sees Dr. Constance Holster in the springtime.  He is pleased with this plan  On date of service, in total, I spent 25 minutes on this encounter. Patient was seen in person. _____________________________________   Eppie Gibson, MD

## 2020-03-28 ENCOUNTER — Ambulatory Visit: Payer: Medicare Other | Attending: Radiation Oncology

## 2020-03-28 ENCOUNTER — Other Ambulatory Visit: Payer: Self-pay

## 2020-03-28 DIAGNOSIS — R1310 Dysphagia, unspecified: Secondary | ICD-10-CM | POA: Diagnosis not present

## 2020-03-28 NOTE — Patient Instructions (Signed)
If you have trouble with eating (coughing, choking, excessive throat clearing or a "wet" voice") in the future talk with Dr. Constance Holster, Dr. Isidore Moos or your family doctor (PCP).

## 2020-03-28 NOTE — Therapy (Signed)
[facility] [address] [place], [place], [address] Phone: [phone]   Fax:  [phone]  Speech Language Pathology Treatment & Renewal-Discharge Summary  Patient Details  Name: MATTHE SLOANE MRN: [id number] Date of Birth: Feb 06, 1940 Referring Provider (SLP): Eppie Gibson, MD   Encounter Date: 03/28/2020   End of Session - 03/28/20 1035    Visit Number 4    Number of Visits 7    Date for SLP Re-Evaluation 03/28/20    SLP Start Time 0936    SLP Stop Time  9    SLP Time Calculation (min) 29 min    Activity Tolerance Patient tolerated treatment well           Past Medical History:  Diagnosis Date  . Bowel obstruction ([place]) 06/2009-10/01/2013 X 5; 04/03/2016  . COPD (chronic obstructive pulmonary disease) ([place])   . H/O hiatal hernia   . History of stomach ulcers ~ 1960  . Hypoxia    idiopathic sleep related hypoxia  . Impaired fasting glucose   . On home oxygen therapy    "2L q hs" (04/03/2016)  . Oral cancer ([place])   . Osteoporosis   . Pneumonia 1990s X 1  . SBO (small bowel obstruction) ([place]) 04/21/2017  . Shortness of breath     Past Surgical History:  Procedure Laterality Date  . ABDOMINAL EXPLORATION SURGERY  06/2009; 10/2009  . ABDOMINAL SURGERY    . APPENDECTOMY  10/2009  . CHOLECYSTECTOMY N/A 09/06/2016   Procedure: LAPAROSCOPIC CHOLECYSTECTOMY WITH  INTRAOPERATIVE CHOLANGIOGRAM;  Surgeon: Ralene Ok, MD;  Location: [facility];  Service: General;  Laterality: N/A;  . COLONOSCOPY    . ESOPHAGOGASTRODUODENOSCOPY     Dr Oletta Lamas   . GUM SURGERY    . GUM SURGERY     upper gum surgery, cancer   . HERNIA REPAIR     "naval"  . LESION REMOVAL Left 10/04/2019   Procedure: ORAL LESION REMOVAL W/BIOPSY;  Surgeon: [name], MD;  Location: [facility];  Service: ENT;  Laterality: Left;  . 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 UMBILICAL HERNIA REPAIR  06/2009   w/mesh    There  were no vitals filed for this visit.   SPEECH THERAPY RENEWAL/DISCHARGE SUMMARY  Visits from Start of Care: 4  Current functional level related to goals / functional outcomes: See goals below - to cont to be enforced for today's session.  Pt partially met LTGs. Swallowing is WNL/WFL for regular diet/thin liquids. No overt s/s oral difficulties. Pt denies any nasal regurgitation during meals and no nasal emission detected during ST sessions to date.   Remaining deficits: None noted.   Education / Equipment: Late effects head/neck radiation on swallow ability, HEP procedure.   Plan: Patient agrees to discharge.  Patient goals were not met. Patient is being discharged due to meeting the stated rehab goals.  ?????         Subjective Assessment - 03/28/20 0941    Subjective "I have a little taste and smell - if I had that I'd be 100%."    Currently in Pain? No/denies                 ADULT SLP TREATMENT - 03/28/20 0945      General Information   Behavior/Cognition Alert;Cooperative;Pleasant mood      Treatment Provided   Treatment provided Dysphagia      Dysphagia Treatment   Temperature Spikes Noted  No    Respiratory Status Room air    Treatment Methods Patient/caregiver education;Therapeutic exercise;Skilled observation    Patient observed directly with PO's Yes    Type of PO's observed Thin liquids;Dysphagia 3 (soft)    Oral Phase Signs & Symptoms --   none noted   Pharyngeal Phase Signs & Symptoms --   none noted   Other treatment/comments "I've been slacking off a bit with the exercises (frequency)." SLP encouraged pt for cont'd BID completion with entire scope of HEP. Today pt completed his HEP with modified independence. Pt reports he's very happy with his swallowing at this time but remains slightly frustrated about his decr'd smell/taste. SLP reminded him what MD and dietician have reinforced that recovery of these senses takes time and some sort of difference  could be his new normal.      Assessment / Recommendations / Plan   Plan Discharge SLP treatment due to (comment)      Progression Toward Goals   Progression toward goals --   d/c day - see goals           SLP Education - 03/28/20 1034    Education Details when to reduce frequency of HEP to x2-3/week, decr'd taste/smell takes time- some difference might be new normal    Person(s) Educated Patient;Child(ren)    Methods Explanation    Comprehension Verbalized understanding             SLP Short Term Goals - 03-28-20                       SLP SHORT TERM GOAL #1      Title pt will complete HEP with rare min A       Time 2       Period --   sessions, for all STGs      Status Achieved                SLP SHORT TERM GOAL #2      Title pt will tell SLP why pt is completing HEP (rationale) with modified independence       Time        Status Achieved               SLP SHORT TERM GOAL #3      Title pt will describe 3 overt s/s aspiration PNA with modified independence       Time        Status Achieved               SLP SHORT TERM GOAL #4      Title pt will tell SLP how a food journal could hasten return to a more normalized diet       Time        Status Deferred                                    SLP Long Term Goals - 03-28-20                       SLP LONG TERM GOAL #1      Title pt will complete HEP with modified independence over two visits       Time        Period --   sessions, for all LTGs  Status Achieved               SLP LONG TERM GOAL #2      Title pt will describe how to modify HEP over time, and the timeline associated with reduction in HEP frequency with modified independence over two sessions       Time        Status Partially Met           Plan: At this time pt swallowing is deemed WNL/WFL with regular solids and thin liquids. SLP reviewed individualized HEP for dysphagia and pt completed each exercise on their own without cues. There are no  overt s/s aspiration reported by pt at this time. Pt agrees with d/c today.      Patient will benefit from skilled therapeutic intervention in order to improve the following deficits and impairments:   Dysphagia, unspecified type    Problem List Patient Active Problem List   Diagnosis Date Noted  . Carcinoma of hard palate ([place]) 10/26/2019  . 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 Supplemental oxygen dependent 08/09/2019  . Lactic acidosis 08/05/2019  . 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[facility]) 08/05/2019  . Acute abdominal pain   . Acute cholecystitis 09/05/2016  . Leukocytosis 04/03/2016  . SBO (small bowel obstruction) ([place]) 04/03/2016  . Hyperbilirubinemia 04/03/2016  . AKI (acute kidney injury) ([place]) 04/03/2016  . Nausea, vomiting and diarrhea 10/01/2013  . COPD (chronic obstructive pulmonary disease) ([place]) 02/23/2007    [facility] ,[place], [place]  03/28/2020, 10:49 AM  [facility] [address] [place], [place], [address] Phone: [phone]   Fax:  [phone]   Name: KRISTAN VOTTA MRN: [id number] Date of Birth: 21-Jan-1941

## 2020-06-07 DIAGNOSIS — J449 Chronic obstructive pulmonary disease, unspecified: Secondary | ICD-10-CM | POA: Diagnosis not present

## 2020-06-07 DIAGNOSIS — M810 Age-related osteoporosis without current pathological fracture: Secondary | ICD-10-CM | POA: Diagnosis not present

## 2020-06-07 DIAGNOSIS — Z86007 Personal history of in-situ neoplasm of skin: Secondary | ICD-10-CM | POA: Diagnosis not present

## 2020-06-07 DIAGNOSIS — G4734 Idiopathic sleep related nonobstructive alveolar hypoventilation: Secondary | ICD-10-CM | POA: Diagnosis not present

## 2020-06-17 DIAGNOSIS — J449 Chronic obstructive pulmonary disease, unspecified: Secondary | ICD-10-CM | POA: Diagnosis not present

## 2020-07-06 DIAGNOSIS — L309 Dermatitis, unspecified: Secondary | ICD-10-CM | POA: Diagnosis not present

## 2020-07-06 DIAGNOSIS — J449 Chronic obstructive pulmonary disease, unspecified: Secondary | ICD-10-CM | POA: Diagnosis not present

## 2020-08-14 DIAGNOSIS — Z85819 Personal history of malignant neoplasm of unspecified site of lip, oral cavity, and pharynx: Secondary | ICD-10-CM | POA: Diagnosis not present

## 2020-08-22 ENCOUNTER — Ambulatory Visit
Admission: RE | Admit: 2020-08-22 | Discharge: 2020-08-22 | Disposition: A | Discharge status: Home / Self Care | Payer: Medicare Other | Source: Ambulatory Visit | Attending: Radiation Oncology | Admitting: Radiation Oncology

## 2020-08-22 ENCOUNTER — Other Ambulatory Visit: Payer: Self-pay

## 2020-08-22 ENCOUNTER — Encounter: Payer: Self-pay | Admitting: Radiation Oncology

## 2020-08-22 VITALS — BP 93/78 | HR 76 | Temp 97.6°F | Resp 20 | Ht 70.0 in | Wt 155.0 lb

## 2020-08-22 DIAGNOSIS — Z85819 Personal history of malignant neoplasm of unspecified site of lip, oral cavity, and pharynx: Secondary | ICD-10-CM | POA: Insufficient documentation

## 2020-08-22 DIAGNOSIS — C050 Malignant neoplasm of hard palate: Secondary | ICD-10-CM

## 2020-08-22 DIAGNOSIS — Z79899 Other long term (current) drug therapy: Secondary | ICD-10-CM | POA: Insufficient documentation

## 2020-08-22 DIAGNOSIS — Z1329 Encounter for screening for other suspected endocrine disorder: Secondary | ICD-10-CM

## 2020-08-22 DIAGNOSIS — Z08 Encounter for follow-up examination after completed treatment for malignant neoplasm: Secondary | ICD-10-CM | POA: Diagnosis not present

## 2020-08-22 DIAGNOSIS — Z7982 Long term (current) use of aspirin: Secondary | ICD-10-CM | POA: Insufficient documentation

## 2020-08-22 DIAGNOSIS — C059 Malignant neoplasm of palate, unspecified: Secondary | ICD-10-CM | POA: Diagnosis not present

## 2020-08-22 DIAGNOSIS — C058 Malignant neoplasm of overlapping sites of palate: Secondary | ICD-10-CM | POA: Diagnosis not present

## 2020-08-22 DIAGNOSIS — Z923 Personal history of irradiation: Secondary | ICD-10-CM | POA: Insufficient documentation

## 2020-08-22 NOTE — Progress Notes (Signed)
Patient in for follow up is doing well denies any pain. Denies any issues swallowing at this time. Patient denies any issues states his taste and smell is coming back. Able to eat what he wants with out difficulty.

## 2020-08-22 NOTE — Progress Notes (Addendum)
Radiation Oncology         [phone] ________________________________  Name: Lonnie Schaefer MRN: [id number]  Date: 08/22/2020  DOB: November 07, 1940  Follow-Up Visit Note  CC: [name], FNP  [name], MD  Diagnosis and Prior Radiotherapy:    C05.0   ICD-10-CM   1. Screening for hypothyroidism  Z13.29 TSH    2. 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) Staging form: Oral Cavity, AJCC 8th Edition - Clinical stage from 10/26/2019: Stage I (cT1, cN0, cM0) - Signed by Eppie Gibson, MD on 10/27/2019 Stage prefix: Initial diagnosis Histologic grade (G): G3 Histologic grading system: 3 grade system  Carcinoma of hard palate (HCC) - No stage assigned - Unsigned   Radiation Treatment Dates: 11/10/2019 through 12/07/2019 Site Technique Total Dose (Gy) Dose per Fx (Gy) Completed Fx Beam Energies  Hard Palate: HN_L_palat IMRT 54/54 2.7 20/20 6X    CHIEF COMPLAINT:  Here for follow-up and surveillance of mouth cancer  Narrative:    Patient presents today for follow-up after completing radiation to his palate on 12/07/2019.   He is doing well denies any pain. Denies any issues swallowing at this time. Patient denies any issues states his taste and smell is coming back. Able to eat what he wants with out difficulty.                    Although edentulous,he still sees a dentist annually. [facility], last seen June 22)  ALLERGIES:  has No Known Allergies.  Meds: Current Outpatient Medications  Medication Sig Dispense Refill   albuterol (PROVENTIL HFA;VENTOLIN HFA) 108 (90 BASE) MCG/ACT inhaler Inhale 1 puff into the lungs every 6 (six) hours as needed for wheezing.      alendronate (FOSAMAX) 70 MG tablet Take 70 mg by mouth every Wednesday.  0   aspirin EC 81 MG tablet Take 81 mg by mouth daily. Swallow whole.      clobetasol ointment (TEMOVATE) 4.00 % Apply 1 application topically 2 (two) times daily as needed (rash).      lactose free nutrition (BOOST) LIQD Take 237 mLs by mouth daily.     OXYGEN Inhale 2 L into the lungs at bedtime.     polyethylene glycol (MIRALAX / GLYCOLAX) 17 g packet Take 17 g by mouth 2 (two) times daily. (Patient taking differently: Take 17 g by mouth daily as needed for mild constipation.) 14 each 0   Tiotropium Bromide-Olodaterol (STIOLTO RESPIMAT) 2.5-2.5 MCG/ACT AERS Inhale 2 puffs into the lungs daily. 2 each 0   HYDROcodone-acetaminophen (NORCO) 7.5-325 MG tablet Take 1 tablet by mouth every 6 (six) hours as needed for moderate pain. (Patient not taking: No sig reported) 20 tablet 0   lidocaine (XYLOCAINE) 2 % solution MIX 1 PART LIDOCAINE WITH 1 PART WATER. SWISH AND SPIT 8 ML 30 MINS BEFORE MEALS AND AT BEDTIME UP TO 8 TIMES PER DAY (IF SWALLOW NO MORE THAN 4 X DAILY) (Patient not taking: No sig reported) 250 mL 4   meclizine (ANTIVERT) 12.5 MG tablet Take 12.5 mg by mouth 3 (three) times daily as needed for dizziness. (Patient not taking: Reported on 08/22/2020)     mupirocin cream (BACTROBAN) 2 % Apply topically 2 (two) times daily. Apply to rash on the back (Patient  not taking: No sig reported) 15 g 0   No current facility-administered medications for this encounter.    Physical Findings: The patient is in no acute distress. Patient is alert and oriented. Wt Readings from Last 3 Encounters:  08/22/20 155 lb (70.3 kg)  03/21/20 154 lb 3.2 oz (69.9 kg)  01/03/20 153 lb 12.8 oz (69.8 kg)    height is 5\' 10"  (1.778 m) and weight is 155 lb (70.3 kg). His temperature is 97.6 F (36.4 C). His blood pressure is 93/78 and his pulse is 76. His respiration is 20 and oxygen saturation is 98%. .  General: Alert and oriented, in no acute distress HEENT: Edentulous.  Head is normocephalic. Extraocular movements are intact. Oropharynx/oral cavity without tumor; no thrush Neck: Neck  is notable for no palpable adenopathy Skin: Skin in treatment fields shows satisfactory healing  Ext: no edema Psychiatric: Judgment and insight are intact. Affect is appropriate. Heart: RRR Chest: CTAB      Lab Findings: Lab Results  Component Value Date   WBC 5.3 02/24/2020   HGB 13.9 02/24/2020   HCT 41.0 02/24/2020   MCV 96.4 02/24/2020   PLT 216 02/24/2020    Lab Results  Component Value Date   TSH 4.782 (H) 11/03/2019    Radiographic Findings: No results found.   Impression/Plan:    1) Head and Neck Cancer Status: NED    2) Nutritional Status: Doing well with oral intake and maintaining weight Wt Readings from Last 3 Encounters:  08/22/20 155 lb (70.3 kg)  03/21/20 154 lb 3.2 oz (69.9 kg)  01/03/20 153 lb 12.8 oz (69.8 kg)    3) Risk Factors: The patient has been educated about risk factors including alcohol and tobacco abuse; they understand that avoidance of alcohol and tobacco is important to prevent recurrences as well as other cancers - he is abstaining  4) Swallowing: Good function  5) Dental: He is edentulous  6) Thyroid function: He denies symptoms of hypothyroidism.  TSH lab result pending.  7)  Follow-up in 03/2021.  He sees Dr. Constance Holster in the interim.  On date of service, in total, I spent 25 minutes on this encounter. Patient was seen in person. _____________________________________   Eppie Gibson, MD

## 2020-08-23 ENCOUNTER — Encounter: Payer: Self-pay | Admitting: Radiation Oncology

## 2020-08-23 ENCOUNTER — Other Ambulatory Visit: Payer: Self-pay

## 2020-08-23 DIAGNOSIS — Z1329 Encounter for screening for other suspected endocrine disorder: Secondary | ICD-10-CM

## 2020-08-23 LAB — TSH: TSH: 7.334 u[IU]/mL — ABNORMAL HIGH (ref 0.320–4.118)

## 2020-08-23 LAB — T4, FREE: Free T4: 0.71 ng/dL (ref 0.61–1.12)

## 2020-10-16 ENCOUNTER — Emergency Department (HOSPITAL_COMMUNITY)
Admission: EM | Admit: 2020-10-16 | Discharge: 2020-10-17 | Disposition: A | Discharge status: Home / Self Care | Payer: Medicare Other | Attending: Emergency Medicine | Admitting: Emergency Medicine

## 2020-10-16 ENCOUNTER — Other Ambulatory Visit: Payer: Self-pay

## 2020-10-16 ENCOUNTER — Emergency Department (HOSPITAL_COMMUNITY): Payer: Medicare Other

## 2020-10-16 ENCOUNTER — Encounter (HOSPITAL_COMMUNITY): Payer: Self-pay | Admitting: *Deleted

## 2020-10-16 DIAGNOSIS — J449 Chronic obstructive pulmonary disease, unspecified: Secondary | ICD-10-CM | POA: Insufficient documentation

## 2020-10-16 DIAGNOSIS — R791 Abnormal coagulation profile: Secondary | ICD-10-CM | POA: Diagnosis not present

## 2020-10-16 DIAGNOSIS — M50223 Other cervical disc displacement at C6-C7 level: Secondary | ICD-10-CM | POA: Diagnosis not present

## 2020-10-16 DIAGNOSIS — Z87891 Personal history of nicotine dependence: Secondary | ICD-10-CM | POA: Diagnosis not present

## 2020-10-16 DIAGNOSIS — W228XXA Striking against or struck by other objects, initial encounter: Secondary | ICD-10-CM | POA: Insufficient documentation

## 2020-10-16 DIAGNOSIS — Z7951 Long term (current) use of inhaled steroids: Secondary | ICD-10-CM | POA: Diagnosis not present

## 2020-10-16 DIAGNOSIS — Z85818 Personal history of malignant neoplasm of other sites of lip, oral cavity, and pharynx: Secondary | ICD-10-CM | POA: Diagnosis not present

## 2020-10-16 DIAGNOSIS — M5412 Radiculopathy, cervical region: Secondary | ICD-10-CM | POA: Insufficient documentation

## 2020-10-16 DIAGNOSIS — R531 Weakness: Secondary | ICD-10-CM | POA: Diagnosis present

## 2020-10-16 DIAGNOSIS — I639 Cerebral infarction, unspecified: Secondary | ICD-10-CM | POA: Diagnosis not present

## 2020-10-16 DIAGNOSIS — M4802 Spinal stenosis, cervical region: Secondary | ICD-10-CM | POA: Diagnosis not present

## 2020-10-16 DIAGNOSIS — Z7982 Long term (current) use of aspirin: Secondary | ICD-10-CM | POA: Diagnosis not present

## 2020-10-16 DIAGNOSIS — R9431 Abnormal electrocardiogram [ECG] [EKG]: Secondary | ICD-10-CM | POA: Diagnosis not present

## 2020-10-16 DIAGNOSIS — M5021 Other cervical disc displacement,  high cervical region: Secondary | ICD-10-CM | POA: Diagnosis not present

## 2020-10-16 DIAGNOSIS — G9519 Other vascular myelopathies: Secondary | ICD-10-CM | POA: Diagnosis not present

## 2020-10-16 DIAGNOSIS — R29818 Other symptoms and signs involving the nervous system: Secondary | ICD-10-CM | POA: Diagnosis not present

## 2020-10-16 DIAGNOSIS — S0990XA Unspecified injury of head, initial encounter: Secondary | ICD-10-CM | POA: Diagnosis not present

## 2020-10-16 LAB — CBC WITH DIFFERENTIAL/PLATELET
Abs Immature Granulocytes: 0.02 10*3/uL (ref 0.00–0.07)
Basophils Absolute: 0 10*3/uL (ref 0.0–0.1)
Basophils Relative: 1 %
Eosinophils Absolute: 0.1 10*3/uL (ref 0.0–0.5)
Eosinophils Relative: 2 %
HCT: 41.5 % (ref 39.0–52.0)
Hemoglobin: 13.6 g/dL (ref 13.0–17.0)
Immature Granulocytes: 0 %
Lymphocytes Relative: 12 %
Lymphs Abs: 0.7 10*3/uL (ref 0.7–4.0)
MCH: 31.4 pg (ref 26.0–34.0)
MCHC: 32.8 g/dL (ref 30.0–36.0)
MCV: 95.8 fL (ref 80.0–100.0)
Monocytes Absolute: 0.7 10*3/uL (ref 0.1–1.0)
Monocytes Relative: 11 %
Neutro Abs: 4.3 10*3/uL (ref 1.7–7.7)
Neutrophils Relative %: 74 %
Platelets: 219 10*3/uL (ref 150–400)
RBC: 4.33 MIL/uL (ref 4.22–5.81)
RDW: 13 % (ref 11.5–15.5)
WBC: 5.8 10*3/uL (ref 4.0–10.5)
nRBC: 0 % (ref 0.0–0.2)

## 2020-10-16 LAB — URINALYSIS, ROUTINE W REFLEX MICROSCOPIC
Bilirubin Urine: NEGATIVE
Glucose, UA: NEGATIVE mg/dL
Hgb urine dipstick: NEGATIVE
Ketones, ur: NEGATIVE mg/dL
Leukocytes,Ua: NEGATIVE
Nitrite: NEGATIVE
Protein, ur: NEGATIVE mg/dL
Specific Gravity, Urine: 1.02 (ref 1.005–1.030)
pH: 6 (ref 5.0–8.0)

## 2020-10-16 LAB — BASIC METABOLIC PANEL
Anion gap: 13 (ref 5–15)
BUN: 10 mg/dL (ref 8–23)
CO2: 25 mmol/L (ref 22–32)
Calcium: 8.6 mg/dL — ABNORMAL LOW (ref 8.9–10.3)
Chloride: 99 mmol/L (ref 98–111)
Creatinine, Ser: 0.94 mg/dL (ref 0.61–1.24)
GFR, Estimated: >60 mL/min (ref >60)
Glucose, Bld: 101 mg/dL — ABNORMAL HIGH (ref 70–99)
Potassium: 3.9 mmol/L (ref 3.5–5.1)
Sodium: 137 mmol/L (ref 135–145)

## 2020-10-16 LAB — PROTIME-INR
INR: 0.9 (ref 0.8–1.2)
Prothrombin Time: 12.6 seconds (ref 11.4–15.2)

## 2020-10-16 LAB — APTT: aPTT: 29 seconds (ref 24–36)

## 2020-10-16 MED ORDER — PREDNISONE 10 MG PO TABS: 40.0000 mg | ORAL_TABLET | Freq: Every day | ORAL | 0 refills | Status: AC

## 2020-10-16 NOTE — ED Provider Notes (Signed)
[facility] EMERGENCY DEPARTMENT Provider Note   CSN: [id number] Arrival date & time: 10/16/20  1113     History Chief Complaint  Patient presents with   Head Injury    Lonnie Schaefer is a 80 y.o. male.   Head Injury   Pt states he hit his head 5-6 weeks ago.  He thought he was ok but then in the last week he started having pressure in his head and then it goes away.  No vomiting or fevers.  No sinus congestion.  When he exerts himself it gets a little worse.  No numbness or weakness although left hand feels a little week.  Some pins and needles in elbow and shoulder.  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 ([place]) 10/26/2019   Supplemental oxygen dependent 08/09/2019   Lactic acidosis 08/05/2019   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[place]) 08/05/2019   Acute abdominal pain    Acute cholecystitis 09/05/2016   Leukocytosis 04/03/2016   SBO (small bowel obstruction) ([place]) 04/03/2016   Hyperbilirubinemia 04/03/2016   AKI (acute kidney injury) ([place]) 04/03/2016   Nausea, vomiting and diarrhea 10/01/2013   COPD (chronic obstructive pulmonary disease) ([place]) 02/23/2007    Past Surgical History:  Procedure Laterality Date   ABDOMINAL EXPLORATION SURGERY  06/2009; 10/2009   ABDOMINAL SURGERY     APPENDECTOMY  10/2009   CHOLECYSTECTOMY N/A 09/06/2016   Procedure: LAPAROSCOPIC CHOLECYSTECTOMY WITH   INTRAOPERATIVE CHOLANGIOGRAM;  Surgeon: Ralene Ok, MD;  Location: [facility] OR;  Service: General;  Laterality: N/A;   COLONOSCOPY     ESOPHAGOGASTRODUODENOSCOPY     Dr Oletta Lamas    GUM SURGERY     GUM SURGERY     upper gum surgery, cancer    HERNIA REPAIR     "naval"   LESION REMOVAL Left 10/04/2019   Procedure: ORAL LESION REMOVAL W/BIOPSY;  Surgeon: [name], MD;  Location: [facility];  Service: ENT;  Laterality: Left;   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   hard & soft palate   UMBILICAL HERNIA REPAIR  06/2009   w/mesh       Family History  Problem Relation Age of Onset   Pneumonia Mother     Social History   Tobacco Use   Smoking status: Former    Packs/day: 2.00    Years: 50.00    Pack years: 100.00    Types: Cigarettes    Quit date: 10/16/1999    Years since quitting: 21.0   Smokeless tobacco: Never  Vaping Use   Vaping Use: Never used  Substance Use Topics   Alcohol use: Not Currently    Comment: 04/03/2016 "quit drinking 01/1987" occ use    Drug use: No    Home Medications Prior to Admission medications   Medication Sig Start Date  End Date Taking? Authorizing Provider  predniSONE (DELTASONE) 10 MG tablet Take 4 tablets (40 mg total) by mouth daily for 5 days. 10/16/20 10/21/20 Yes Dorie Rank, MD  albuterol (PROVENTIL HFA;VENTOLIN HFA) 108 (90 BASE) MCG/ACT inhaler Inhale 1 puff into the lungs every 6 (six) hours as needed for wheezing.     [provider]  alendronate (FOSAMAX) 70 MG tablet Take 70 mg by mouth every Wednesday. 03/25/16   [provider]  aspirin EC 81 MG tablet Take 81 mg by mouth daily. Swallow whole.    [provider]  clobetasol ointment (TEMOVATE) [id number] % Apply 1 application topically 2 (two) times daily as needed (rash).     [provider]  HYDROcodone-acetaminophen (NORCO) 7.5-325 MG tablet Take 1 tablet by mouth every 6 (six) hours as needed for moderate pain. Patient not taking: No sig reported  10/04/19   [name], MD  lactose free nutrition (BOOST) LIQD Take 237 mLs by mouth daily.    [provider]  lidocaine (XYLOCAINE) 2 % solution MIX 1 PART LIDOCAINE WITH 1 PART WATER. SWISH AND SPIT 8 ML 30 MINS BEFORE MEALS AND AT BEDTIME UP TO 8 TIMES PER DAY (IF SWALLOW NO MORE THAN 4 X DAILY) Patient not taking: No sig reported 11/15/19 11/14/20  Eppie Gibson, MD  meclizine (ANTIVERT) 12.5 MG tablet Take 12.5 mg by mouth 3 (three) times daily as needed for dizziness. Patient not taking: Reported on 08/22/2020    [provider]  mupirocin cream (BACTROBAN) 2 % Apply topically 2 (two) times daily. Apply to rash on the back Patient not taking: No sig reported 04/11/16   Bonnielee Haff, MD  OXYGEN Inhale 2 L into the lungs at bedtime.    [provider]  polyethylene glycol (MIRALAX / GLYCOLAX) 17 g packet Take 17 g by mouth 2 (two) times daily. Patient taking differently: Take 17 g by mouth daily as needed for mild constipation. 08/09/19   Elodia Florence., MD  Tiotropium Bromide-Olodaterol (STIOLTO RESPIMAT) 2.5-2.5 MCG/ACT AERS Inhale 2 puffs into the lungs daily. 01/03/20   Tyler Pita, MD    Allergies    Patient has no known allergies.  Review of Systems   Review of Systems  Physical Exam Updated Vital Signs BP 127/80   Pulse 82   Temp 98.6 F (37 C) (Oral)   Resp (!) 24   SpO2 96%   Physical Exam  ED Results / Procedures / Treatments   Labs (all labs ordered are listed, but only abnormal results are displayed) Labs Reviewed  BASIC METABOLIC PANEL - Abnormal; Notable for the following components:      Result Value   Glucose, Bld 101 (*)    Calcium 8.6 (*)    All other components within normal limits  CBC WITH DIFFERENTIAL/PLATELET  PROTIME-INR  APTT  URINALYSIS, ROUTINE W REFLEX MICROSCOPIC    EKG EKG Interpretation  Date/Time:  Monday October 16 2020 21:53:39 EDT Ventricular Rate:  75 PR Interval:  180 QRS  Duration: 85 QT Interval:  373 QTC Calculation: 417 R Axis:   67 Text Interpretation: Sinus rhythm Anteroseptal infarct, age indeterminate No significant change since last tracing Confirmed by Dorie Rank [phone]) on 10/16/2020 9:59:56 PM  Radiology CT HEAD WO CONTRAST  Result Date: 10/16/2020 CLINICAL DATA:  Neuro deficit, acute, stroke suspected. Remote history of left head injury with persistent, worsening headache EXAM: CT HEAD WITHOUT CONTRAST TECHNIQUE: Contiguous axial images were obtained from the base of  the skull through the vertex without intravenous contrast. COMPARISON:  None. FINDINGS: Brain: Normal anatomic configuration. Parenchymal volume loss is commensurate with the patient's age. Tiny remote lacunar infarct within the left caudate head. No abnormal intra or extra-axial mass lesion or fluid collection. No abnormal mass effect or midline shift. No evidence of acute intracranial hemorrhage or infarct. Ventricular size is normal. Cerebellum unremarkable. Vascular: No asymmetric hyperdense vasculature at the skull base. Skull: Intact Sinuses/Orbits: Paranasal sinuses are clear. Orbits are unremarkable. Other: Mastoid air cells and middle ear cavities are clear. IMPRESSION: No acute intracranial abnormality. Electronically Signed   By: Fidela Salisbury M.D.   On: 10/16/2020 13:01   MR BRAIN WO CONTRAST  Result Date: 10/16/2020 CLINICAL DATA:  Acute neurologic deficit EXAM: MRI HEAD WITHOUT CONTRAST TECHNIQUE: Multiplanar, multiecho pulse sequences of the brain and surrounding structures were obtained without intravenous contrast. COMPARISON:  None. FINDINGS: Brain: No acute infarct, mass effect or extra-axial collection. No acute or chronic hemorrhage. Normal white matter signal. Generalized volume loss without a clear lobar predilection. The midline structures are normal. Vascular: Major flow voids are preserved. Skull and upper cervical spine: Normal calvarium and skull base. Visualized upper  cervical spine and soft tissues are normal. Sinuses/Orbits:No paranasal sinus fluid levels or advanced mucosal thickening. No mastoid or middle ear effusion. Normal orbits. IMPRESSION: 1. No acute intracranial abnormality. 2. Generalized volume loss without a clear lobar predilection. Electronically Signed   By: Ulyses Jarred M.D.   On: 10/16/2020 23:28   MR Cervical Spine Wo Contrast  Result Date: 10/16/2020 CLINICAL DATA:  Progressive myelopathy EXAM: MRI CERVICAL SPINE WITHOUT CONTRAST TECHNIQUE: Multiplanar, multisequence MR imaging of the cervical spine was performed. No intravenous contrast was administered. COMPARISON:  None. FINDINGS: Alignment: Grade 1 retrolisthesis at C4-5 and C5-6 Vertebrae: Mild left C3-4 facet edema. Cord: Normal signal and morphology. Posterior Fossa, vertebral arteries, paraspinal tissues: Negative. Disc levels: C1-2: Unremarkable. C2-3: Normal disc space and facet joints. There is no spinal canal stenosis. No neural foraminal stenosis. C3-4: Left facet hypertrophy. No disc herniation. There is no spinal canal stenosis. Moderate left neural foraminal stenosis. C4-5: Intermediate sized disc osteophyte complex. There is no spinal canal stenosis. Mild bilateral neural foraminal stenosis. C5-6: Right-greater-than-left uncovertebral hypertrophy. There is no spinal canal stenosis. Mild right neural foraminal stenosis. C6-7: Small disc bulge with uncinate spurring. There is no spinal canal stenosis. No neural foraminal stenosis. C7-T1: Normal disc space and facet joints. There is no spinal canal stenosis. No neural foraminal stenosis. IMPRESSION: 1. No cervical spinal canal stenosis. 2. Mild left C3-4 facet edema, which may be a source of local neck pain. Moderate left C3-4 neural foraminal stenosis secondary to combination of disc bulge and uncinate hypertrophy. 3. Mild bilateral C4-5 and right C5-6 neural foraminal stenosis. Electronically Signed   By: Ulyses Jarred M.D.   On:  10/16/2020 23:33    Procedures Procedures   Medications Ordered in ED Medications - No data to display  ED Course  I have reviewed the triage vital signs and the nursing notes.  Pertinent labs & imaging results that were available during my care of the patient were reviewed by me and considered in my medical decision making (see chart for details).    MDM Rules/Calculators/A&P                           Patient presented to ED with complaints of weakness in his left arm and tingling.  Patient felt it could be related to recent head injury however he had no symptoms for a couple of weeks after the injury.  CT scan was performed and no signs of subdural or subarachnoid hemorrhage.  Patient did have some mild focal grip strength weakness.  I was concerned about the possibility of an occult stroke or cervical radiculopathy.  MRI was performed and there was no acute stroke noted on brain imaging but the cervical spine imaging does show evidence of some facet edema as well as neuroforaminal stenosis.  Suspect that the etiology of his symptoms.  Will discharge home with short course of steroids.  Outpatient follow-up with neurosurgery Final Clinical Impression(s) / ED Diagnoses Final diagnoses:  Cervical radiculopathy    Rx / DC Orders ED Discharge Orders          Ordered    predniSONE (DELTASONE) 10 MG tablet  Daily        10/16/20 2345             Dorie Rank, MD 10/16/20 2349

## 2020-10-16 NOTE — Discharge Instructions (Signed)
Take the medications as prescribed.  Consider following up with a spine doctor to make sure your symptoms improve and to discuss further treatment

## 2020-10-16 NOTE — ED Notes (Signed)
Patient transported to MRI 
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
## 2020-10-17 NOTE — ED Notes (Signed)
DC instructions reviewed with pt. Pt verbalized understanding.  Pt DC 

## 2020-11-21 DIAGNOSIS — Z85819 Personal history of malignant neoplasm of unspecified site of lip, oral cavity, and pharynx: Secondary | ICD-10-CM | POA: Diagnosis not present

## 2021-01-03 DIAGNOSIS — J449 Chronic obstructive pulmonary disease, unspecified: Secondary | ICD-10-CM | POA: Diagnosis not present

## 2021-01-03 DIAGNOSIS — M810 Age-related osteoporosis without current pathological fracture: Secondary | ICD-10-CM | POA: Diagnosis not present

## 2021-01-03 DIAGNOSIS — Z0000 Encounter for general adult medical examination without abnormal findings: Secondary | ICD-10-CM | POA: Diagnosis not present

## 2021-01-03 DIAGNOSIS — G4734 Idiopathic sleep related nonobstructive alveolar hypoventilation: Secondary | ICD-10-CM | POA: Diagnosis not present

## 2021-01-03 DIAGNOSIS — C059 Malignant neoplasm of palate, unspecified: Secondary | ICD-10-CM | POA: Diagnosis not present

## 2021-01-03 DIAGNOSIS — Z125 Encounter for screening for malignant neoplasm of prostate: Secondary | ICD-10-CM | POA: Diagnosis not present

## 2021-01-03 DIAGNOSIS — Z131 Encounter for screening for diabetes mellitus: Secondary | ICD-10-CM | POA: Diagnosis not present

## 2021-01-23 DIAGNOSIS — Z85819 Personal history of malignant neoplasm of unspecified site of lip, oral cavity, and pharynx: Secondary | ICD-10-CM | POA: Diagnosis not present

## 2021-03-27 ENCOUNTER — Ambulatory Visit
Admission: RE | Admit: 2021-03-27 | Discharge: 2021-03-27 | Disposition: A | Discharge status: Home / Self Care | Payer: Medicare Other | Source: Ambulatory Visit | Attending: Radiation Oncology | Admitting: Radiation Oncology

## 2021-03-27 ENCOUNTER — Other Ambulatory Visit: Payer: Self-pay

## 2021-03-27 ENCOUNTER — Encounter: Payer: Self-pay | Admitting: Radiation Oncology

## 2021-03-27 VITALS — BP 113/60 | HR 68 | Temp 96.8°F | Resp 18 | Ht 70.0 in | Wt 159.2 lb

## 2021-03-27 DIAGNOSIS — Z923 Personal history of irradiation: Secondary | ICD-10-CM | POA: Insufficient documentation

## 2021-03-27 DIAGNOSIS — Z85819 Personal history of malignant neoplasm of unspecified site of lip, oral cavity, and pharynx: Secondary | ICD-10-CM | POA: Insufficient documentation

## 2021-03-27 DIAGNOSIS — Z85818 Personal history of malignant neoplasm of other sites of lip, oral cavity, and pharynx: Secondary | ICD-10-CM | POA: Diagnosis not present

## 2021-03-27 DIAGNOSIS — R946 Abnormal results of thyroid function studies: Secondary | ICD-10-CM | POA: Diagnosis not present

## 2021-03-27 DIAGNOSIS — C050 Malignant neoplasm of hard palate: Secondary | ICD-10-CM | POA: Diagnosis not present

## 2021-03-27 DIAGNOSIS — Z7982 Long term (current) use of aspirin: Secondary | ICD-10-CM | POA: Diagnosis not present

## 2021-03-27 DIAGNOSIS — C059 Malignant neoplasm of palate, unspecified: Secondary | ICD-10-CM

## 2021-03-27 DIAGNOSIS — Z08 Encounter for follow-up examination after completed treatment for malignant neoplasm: Secondary | ICD-10-CM | POA: Diagnosis not present

## 2021-03-27 DIAGNOSIS — Z79899 Other long term (current) drug therapy: Secondary | ICD-10-CM | POA: Diagnosis not present

## 2021-03-27 DIAGNOSIS — Z1329 Encounter for screening for other suspected endocrine disorder: Secondary | ICD-10-CM

## 2021-03-27 LAB — T4, FREE: Free T4: 0.73 ng/dL (ref 0.61–1.12)

## 2021-03-27 NOTE — Progress Notes (Signed)
Patient presents today for follow-up after completing radiation to his palate on 12/07/2019  Pain issues, if any: denies Using a feeding tube?: N/A Weight changes, if any: gained 8 pounds since treatment, now stable  Swallowing issues, if any: denies Smoking or chewing tobacco? denies Using fluoride trays daily? N/A Last ENT visit was on: 01/23/2021 Saw Dr. [name]:  --Physical Exam:  Healthy-appearing gentleman no distress. Breathing and voice are clear. Nasal exam unremarkable. Oral cavity and pharynx are clear. No signs of any residual tumor. No palpable adenopathy or neck masses. --Impression & Plans:  Stable, no evidence of recurrent disease. Recheck 3 months or sooner as needed.  Other notable issues, if any: none  Vitals:   03/27/21 1422  BP: 113/60  Pulse: 68  Resp: 18  Temp: (!) 96.8 F (36 C)  TempSrc: Temporal  SpO2: 96%  Weight: 159 lb 4 oz (72.2 kg)  Height: 5\' 10"  (1.778 m)

## 2021-03-28 LAB — TSH: TSH: 8.21 u[IU]/mL — ABNORMAL HIGH (ref 0.320–4.118)

## 2021-03-30 ENCOUNTER — Encounter: Payer: Self-pay | Admitting: Radiation Oncology

## 2021-03-30 NOTE — Progress Notes (Signed)
Radiation Oncology         [phone] ________________________________  Name: Lonnie Schaefer MRN: [id number]  Date: 03/27/2021  DOB: [birth year]  Follow-Up Visit Note  CC: [name], FNP  [name], MD  Diagnosis and Prior Radiotherapy:    C05.0   ICD-10-CM   1. 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) Staging form: Oral Cavity, AJCC 8th Edition - Clinical stage from 10/26/2019: Stage I (cT1, cN0, cM0) - Signed by Eppie Gibson, MD on 10/27/2019 Stage prefix: Initial diagnosis Histologic grade (G): G3 Histologic grading system: 3 grade system  Carcinoma of hard palate (HCC) - No stage assigned - Unsigned    Radiation Treatment Dates: 11/10/2019 through 12/07/2019 Site Technique Total Dose (Gy) Dose per Fx (Gy) Completed Fx Beam Energies  Hard Palate: HN_L_palat IMRT 54/54 2.7 20/20 6X    CHIEF COMPLAINT:  Here for follow-up and surveillance of mouth cancer  Narrative:     Patient presents today for follow-up after completing radiation to his palate on 12/07/2019  Pain issues, if any: denies Using a feeding tube?: N/A Weight changes, if any: gained 8 pounds since treatment, now stable  Swallowing issues, if any: denies Smoking or chewing tobacco? denies Using fluoride trays daily? N/A Last ENT visit was on: 01/23/2021 Saw Dr. [name]:  --Physical Exam:  Healthy-appearing gentleman no distress. Breathing and voice are clear. Nasal exam unremarkable. Oral cavity and pharynx are clear. No signs of any residual tumor. No palpable adenopathy or neck masses. --Impression & Plans:  Stable, no evidence of recurrent disease. Recheck 3 months or sooner as needed.  Other notable issues, if any: none  Vitals:   03/27/21 1422  BP: 113/60  Pulse: 68  Resp: 18  Temp: (!) 96.8 F (36 C)  TempSrc: Temporal  SpO2: 96%  Weight: 159  lb 4 oz (72.2 kg)  Height: 5\' 10"  (1.778 m)       ALLERGIES:  has No Known Allergies.  Meds: Current Outpatient Medications  Medication Sig Dispense Refill   albuterol (PROVENTIL HFA;VENTOLIN HFA) 108 (90 BASE) MCG/ACT inhaler Inhale 1 puff into the lungs every 6 (six) hours as needed for wheezing.      alendronate (FOSAMAX) 70 MG tablet Take 70 mg by mouth every Wednesday.  0   aspirin EC 81 MG tablet Take 81 mg by mouth daily. Swallow whole.     clobetasol ointment (TEMOVATE) 6.23 % Apply 1 application topically 2 (two) times daily as needed (rash).      lactose free nutrition (BOOST) LIQD Take 237 mLs by mouth daily.     mupirocin cream (BACTROBAN) 2 % Apply topically 2 (two) times daily. Apply to rash on the back 15 g 0   OXYGEN Inhale 2 L into the lungs at bedtime.     polyethylene glycol (MIRALAX / GLYCOLAX) 17 g packet Take 17 g by mouth 2 (two) times daily. (Patient taking differently: Take 17 g by mouth daily as needed for mild constipation.) 14 each 0   Tiotropium Bromide-Olodaterol (STIOLTO RESPIMAT) 2.5-2.5 MCG/ACT AERS Inhale 2 puffs into the lungs daily. 2 each 0   HYDROcodone-acetaminophen (NORCO) 7.5-325 MG tablet Take 1 tablet by mouth every 6 (six) hours as needed for moderate pain. (Patient not taking: Reported on 02/24/2020) 20 tablet 0  meclizine (ANTIVERT) 12.5 MG tablet Take 12.5 mg by mouth 3 (three) times daily as needed for dizziness. (Patient not taking: Reported on 08/22/2020)     No current facility-administered medications for this encounter.    Physical Findings: The patient is in no acute distress. Patient is alert and oriented. Wt Readings from Last 3 Encounters:  03/27/21 159 lb 4 oz (72.2 kg)  08/22/20 155 lb (70.3 kg)  03/21/20 154 lb 3.2 oz (69.9 kg)    height is 5\' 10"  (1.778 m) and weight is 159 lb 4 oz (72.2 kg). His temporal temperature is 96.8 F (36 C) (abnormal). His blood pressure is 113/60 and his pulse is 68. His respiration is 18 and  oxygen saturation is 96%. .  General: Alert and oriented, in no acute distress HEENT: Edentulous.  Head is normocephalic. Extraocular movements are intact. Oropharynx/oral cavity without tumor; no thrush. 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  Pt reports this is related to something he ate. He will have Dr Constance Holster inspect this area in March but it is not highly concerning. Neck: Neck is notable for no palpable adenopathy Skin: Skin in treatment fields shows satisfactory healing  Ext: no edema Psychiatric: Judgment and insight are intact. Affect is appropriate. Heart: RRR Chest: CTAB      Lab Findings: Lab Results  Component Value Date   WBC 5.8 10/16/2020   HGB 13.6 10/16/2020   HCT 41.5 10/16/2020   MCV 95.8 10/16/2020   PLT 219 10/16/2020    Lab Results  Component Value Date   TSH 8.210 (H) 03/27/2021    Radiographic Findings: No results found.   Impression/Plan:    1) Head and Neck Cancer Status: NED - he is cured.  2) Nutritional Status: Doing well with oral intake and maintaining weight Wt Readings from Last 3 Encounters:  03/27/21 159 lb 4 oz (72.2 kg)  08/22/20 155 lb (70.3 kg)  03/21/20 154 lb 3.2 oz (69.9 kg)    3) Risk Factors: The patient has been educated about risk factors including alcohol and tobacco abuse; they understand that avoidance of alcohol and tobacco is important to prevent recurrences as well as other cancers - he is abstaining  4) Swallowing: Good function  5) Dental: He is edentulous  6) Thyroid function: He denies symptoms of hypothyroidism.  TSH lab is high but Free T4 normal. Continue to follow in survivorship clinic. Lab Results  Component Value Date   TSH 8.210 (H) 03/27/2021   Free T4 today: 0.73 (normal)  7)  Follow-up in [facility] survivorship clinic in 6 mo with thyroid labs - I will see him PRN.  He sees Dr. Constance Holster regularly.  On date of service, in total, I spent 25 minutes on this encounter. Patient was  seen in person. _____________________________________   Eppie Gibson, MD

## 2021-04-25 ENCOUNTER — Telehealth: Payer: Self-pay | Admitting: Nurse Practitioner

## 2021-04-25 NOTE — Telephone Encounter (Signed)
.  Called patient to schedule appointment per 3/22 inbasket, patient is aware of date and time.   ?

## 2021-05-03 DIAGNOSIS — Z85819 Personal history of malignant neoplasm of unspecified site of lip, oral cavity, and pharynx: Secondary | ICD-10-CM | POA: Diagnosis not present

## 2021-05-08 ENCOUNTER — Other Ambulatory Visit: Payer: Self-pay | Admitting: Nurse Practitioner

## 2021-05-08 DIAGNOSIS — C059 Malignant neoplasm of palate, unspecified: Secondary | ICD-10-CM

## 2021-06-12 DIAGNOSIS — U071 COVID-19: Secondary | ICD-10-CM | POA: Diagnosis not present

## 2021-06-12 DIAGNOSIS — J449 Chronic obstructive pulmonary disease, unspecified: Secondary | ICD-10-CM | POA: Diagnosis not present

## 2021-06-22 DIAGNOSIS — G4734 Idiopathic sleep related nonobstructive alveolar hypoventilation: Secondary | ICD-10-CM | POA: Diagnosis not present

## 2021-06-22 DIAGNOSIS — R946 Abnormal results of thyroid function studies: Secondary | ICD-10-CM | POA: Diagnosis not present

## 2021-06-22 DIAGNOSIS — Z125 Encounter for screening for malignant neoplasm of prostate: Secondary | ICD-10-CM | POA: Diagnosis not present

## 2021-06-22 DIAGNOSIS — M810 Age-related osteoporosis without current pathological fracture: Secondary | ICD-10-CM | POA: Diagnosis not present

## 2021-06-22 DIAGNOSIS — Z131 Encounter for screening for diabetes mellitus: Secondary | ICD-10-CM | POA: Diagnosis not present

## 2021-06-22 DIAGNOSIS — Z13220 Encounter for screening for lipoid disorders: Secondary | ICD-10-CM | POA: Diagnosis not present

## 2021-06-22 DIAGNOSIS — Z86007 Personal history of in-situ neoplasm of skin: Secondary | ICD-10-CM | POA: Diagnosis not present

## 2021-06-22 DIAGNOSIS — J449 Chronic obstructive pulmonary disease, unspecified: Secondary | ICD-10-CM | POA: Diagnosis not present

## 2021-06-27 ENCOUNTER — Other Ambulatory Visit: Payer: Self-pay | Admitting: Nurse Practitioner

## 2021-06-27 DIAGNOSIS — M810 Age-related osteoporosis without current pathological fracture: Secondary | ICD-10-CM

## 2021-07-19 DIAGNOSIS — Z85819 Personal history of malignant neoplasm of unspecified site of lip, oral cavity, and pharynx: Secondary | ICD-10-CM | POA: Diagnosis not present

## 2021-08-16 DIAGNOSIS — Z86007 Personal history of in-situ neoplasm of skin: Secondary | ICD-10-CM | POA: Diagnosis not present

## 2021-08-16 DIAGNOSIS — R946 Abnormal results of thyroid function studies: Secondary | ICD-10-CM | POA: Diagnosis not present

## 2021-09-18 ENCOUNTER — Inpatient Hospital Stay: Payer: Medicare Other

## 2021-09-18 ENCOUNTER — Other Ambulatory Visit: Payer: Self-pay

## 2021-09-18 ENCOUNTER — Encounter: Payer: Self-pay | Admitting: Nurse Practitioner

## 2021-09-18 ENCOUNTER — Inpatient Hospital Stay: Payer: Medicare Other | Attending: Nurse Practitioner | Admitting: Nurse Practitioner

## 2021-09-18 VITALS — BP 118/84 | HR 66 | Temp 98.0°F | Resp 16 | Ht 70.0 in | Wt 156.9 lb

## 2021-09-18 DIAGNOSIS — C059 Malignant neoplasm of palate, unspecified: Secondary | ICD-10-CM | POA: Diagnosis not present

## 2021-09-18 DIAGNOSIS — Z87891 Personal history of nicotine dependence: Secondary | ICD-10-CM | POA: Diagnosis not present

## 2021-09-18 DIAGNOSIS — Z79899 Other long term (current) drug therapy: Secondary | ICD-10-CM | POA: Diagnosis not present

## 2021-09-18 DIAGNOSIS — Z923 Personal history of irradiation: Secondary | ICD-10-CM | POA: Diagnosis not present

## 2021-09-18 DIAGNOSIS — Z85818 Personal history of malignant neoplasm of other sites of lip, oral cavity, and pharynx: Secondary | ICD-10-CM | POA: Insufficient documentation

## 2021-09-18 LAB — TSH: TSH: 1.819 u[IU]/mL (ref 0.350–4.500)

## 2021-09-18 LAB — T4, FREE: Free T4: 1.34 ng/dL — ABNORMAL HIGH (ref 0.61–1.12)

## 2021-09-18 NOTE — Progress Notes (Signed)
CLINIC:  Survivorship  Patient Care Team: [name], [place] as PCP - General (Nurse Practitioner) [place], Stephani Police, RN as Oncology Nurse Navigator Eppie Gibson, MD as Consulting Physician (Radiation Oncology) [name], MD as Consulting Physician (Otolaryngology) Alla Feeling, NP as Nurse Practitioner (Nurse Practitioner)   REASON FOR VISIT:  Routine follow-up for history of head & neck cancer.  BRIEF ONCOLOGIC HISTORY:  Oncology History  Cancer of junction of hard and soft palate ([place])  10/26/2019 Initial Diagnosis   Cancer of junction of hard and soft palate ([place])   10/26/2019 Cancer Staging   Staging form: Oral Cavity, AJCC 8th Edition - Clinical stage from 10/26/2019: Stage I (cT1, cN0, cM0) - Signed by Eppie Gibson, MD on 10/27/2019   11/10/2019 - 12/07/2019 Radiation Therapy   Radiation Treatment Dates: 11/10/2019 through 12/07/2019 Site Technique Total Dose (Gy) Dose per Fx (Gy) Completed Fx Beam Energies  Hard Palate: HN_L_palat IMRT 54/54 2.7 20/20 6X        09/18/2021 Survivorship   SCP visit with Cira Rue, NP      INTERVAL HISTORY: Lonnie Schaefer returns for follow-up as scheduled, last seen by Dr. Isidore Moos 2/23 and by ENT Dr. Constance Holster 6/23.  He is doing well overall with no specific complaints.   -Pain: None -Nutrition/Diet: Mostly soft diet with boost twice daily, drinks Gatorade.  Soft diet primarily related to mastication/dentures rather than dysphagia -Dysphagia?:  None -Dental issues?:  Edentulous, with dentures, annual dentist visit using fluoride trays? N/A -Last TSH: 03/27/2021 elevated, 8.210, Synthroid increased.  Today's level is pending -Weight:  3 lbs loss in 6 months  -Last ENT visit: 07/2021, every 3 months for now -Last Rad Onc visit: 03/2021, as needed in the future -Last Dentist visit: Annual    ADDITIONAL REVIEW OF SYSTEMS:  ROS    CURRENT MEDICATIONS:  Current Outpatient Medications on File Prior to Visit  Medication Sig  Dispense Refill   albuterol (PROVENTIL HFA;VENTOLIN HFA) 108 (90 BASE) MCG/ACT inhaler Inhale 1 puff into the lungs every 6 (six) hours as needed for wheezing.      alendronate (FOSAMAX) 70 MG tablet Take 70 mg by mouth every Wednesday.  0   aspirin EC 81 MG tablet Take 81 mg by mouth daily. Swallow whole.     clobetasol ointment (TEMOVATE) 2.48 % Apply 1 application topically 2 (two) times daily as needed (rash).      lactose free nutrition (BOOST) LIQD Take 237 mLs by mouth daily.     mupirocin cream (BACTROBAN) 2 % Apply topically 2 (two) times daily. Apply to rash on the back 15 g 0   OXYGEN Inhale 2 L into the lungs at bedtime.     polyethylene glycol (MIRALAX / GLYCOLAX) 17 g packet Take 17 g by mouth 2 (two) times daily. (Patient taking differently: Take 17 g by mouth daily as needed for mild constipation.) 14 each 0   Tiotropium Bromide-Olodaterol (STIOLTO RESPIMAT) 2.5-2.5 MCG/ACT AERS Inhale 2 puffs into the lungs daily. 2 each 0   No current facility-administered medications on file prior to visit.    ALLERGIES:  No Known Allergies   PHYSICAL EXAM:  Vitals:   09/18/21 1043  BP: 118/84  Pulse: 66  Resp: 16  Temp: 98 F (36.7 C)  SpO2: 94%   Filed Weights   09/18/21 1043  Weight: 156 lb 14.4 oz (71.2 kg)   General: Well appearing male in no acute distress.  HEENT:  Sclerae anicteric. Oral mucosa is pink  and moist without lesions.  Small area of leukoplakia at the left lateral hard palate mucosa, present on prior visit. tongue pink, moist, and midline. Oropharynx is pink and moist, without lesions. Lymph: No preauricular, postauricular, cervical, supraclavicular, or infraclavicular lymphadenopathy noted on palpation.   Neck: No palpable masses. Cardiovascular: Normal rate and rhythm. Respiratory: Clear; breathing non-labored.  Neuro: No focal deficits. Steady gait.   Psych: Normal mood and affect for situation. Extremities: No edema.  Skin: Warm and dry.     LABORATORY DATA:  TSH, T4 pending  DIAGNOSTIC IMAGING:  None at this visit.    ASSESSMENT & PLAN:  Lonnie Schaefer is a pleasant 81 y.o. 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  Patient presents to survivorship clinic today for routine follow-up.   1.  Head and neck cancer cancer:  Lonnie Schaefer is clinically without evidence of disease or recurrence on physical exam today.  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  He will follow-up with ENT next month for exam.  2. Nutritional status: Lonnie Schaefer tolerates soft food and boost.  He reports that chewing is the issue not dysphagia. Weight is down 3 pounds in 6 months.  Encouraged to continue to consume adequate hydration and nutrition, as tolerated.    3. At risk for dysphagia: Lonnie Schaefer currently denies dysphagia.  No need for SLP currently.   4.  At risk for neck lymphedema: No evidence of lymphedema at this time, will refer to PT if needed.  5.  At risk for hypothyroidism: The thyroid gland is often affected after treatment for head & neck cancer.  Lonnie Schaefer most recent TSH in 03/2021 was elevated, Synthroid has been adjusted.  The level is pending from today.  We discussed that he will continue to have serial TSH monitoring for at least the next 5 years as part of his routine follow-up and post-cancer treatment care.   6. At risk for tooth decay/dental concerns: Edentulous.  Using dentures, continue annual dentist visit  7.  Lung cancer screening:  [place] now offers eligible patients lung cancer screening with a low-dose chest CT to aid in early detection, provide more effective treatment options, and ultimately improve survival benefits for patients diagnosed early.  Below is the selection criteria for screening:  Medicare patients: 55-77 years; privately  insured patients 55-80 years. Active or former smokers who have quit within the last 15 years. 30+ pack-year history of smoking  Exclusion criteria - No signs/symptoms of lung cancer (i.e., no recent history of hemoptysis and no unexplained weight loss >15 pounds in the last 6 months). Willing and healthy enough to undergo biopsies/surgery if needed.  Lonnie Schaefer currently does not meet criteria for lung cancer screening.  Therefore, I have not placed a referral for screening today.    8. Tobacco & alcohol use: Lonnie Schaefer reports that he quit smoking in 2003 and continues to abstain from all tobacco products and alcohol.   Lonnie Schaefer voiced understanding of the importance of continuing to remain both tobacco and alcohol-free.    9. Health maintenance and wellness promotion: Encouraged to continue healthy active lifestyle and age-appropriate health maintenance.  10. Support services/counseling: It is not uncommon for this period of the patient's cancer care trajectory to be one of many emotions and stressors.  We discussed an opportunity  for him to participate in the next session of Head & Neck FYNN ("Finding Your New Normal") support group series, designed for patients after they have completed treatment.   Lonnie Schaefer was encouraged to take advantage of our many other support services programs, support groups, and/or counseling in coping with his new life as a cancer survivor after completing anti-cancer treatment. The patient was offered support today through active listening and expressive supportive counseling.     Dispo:  -See Dr. Constance Holster (ENT) in 10/2021 -Return to cancer center to see Dr. Isidore Moos as needed -Return to cancer center to see Survivorship NP in 1 year.      A total of 30 minutes was spent in the face-to-face care of this patient, with greater than 50% of that time spent in counseling and care-coordination.    Cira Rue, NP [facility] [phone]

## 2021-09-26 ENCOUNTER — Telehealth: Payer: Self-pay

## 2021-09-26 NOTE — Telephone Encounter (Signed)
This nurse reached out to patient related to lab results.  There was no answer for the patient or the daughter.  This nurse sent patient a My Chart message as well.  Lab results were faxed to patients primary care physician.  No further concerns at this time.

## 2021-10-18 DIAGNOSIS — E039 Hypothyroidism, unspecified: Secondary | ICD-10-CM | POA: Diagnosis not present

## 2021-10-19 DIAGNOSIS — Z85819 Personal history of malignant neoplasm of unspecified site of lip, oral cavity, and pharynx: Secondary | ICD-10-CM | POA: Diagnosis not present

## 2021-12-11 ENCOUNTER — Ambulatory Visit
Admission: RE | Admit: 2021-12-11 | Discharge: 2021-12-11 | Disposition: A | Discharge status: Home / Self Care | Payer: Medicare Other | Source: Ambulatory Visit | Attending: Nurse Practitioner | Admitting: Nurse Practitioner

## 2021-12-11 DIAGNOSIS — M810 Age-related osteoporosis without current pathological fracture: Secondary | ICD-10-CM

## 2021-12-11 DIAGNOSIS — M8589 Other specified disorders of bone density and structure, multiple sites: Secondary | ICD-10-CM | POA: Diagnosis not present

## 2021-12-25 DIAGNOSIS — H40033 Anatomical narrow angle, bilateral: Secondary | ICD-10-CM | POA: Diagnosis not present

## 2021-12-25 DIAGNOSIS — H2513 Age-related nuclear cataract, bilateral: Secondary | ICD-10-CM | POA: Diagnosis not present

## 2021-12-25 DIAGNOSIS — H524 Presbyopia: Secondary | ICD-10-CM | POA: Diagnosis not present

## 2022-01-10 DIAGNOSIS — Z13220 Encounter for screening for lipoid disorders: Secondary | ICD-10-CM | POA: Diagnosis not present

## 2022-01-10 DIAGNOSIS — E039 Hypothyroidism, unspecified: Secondary | ICD-10-CM | POA: Diagnosis not present

## 2022-01-10 DIAGNOSIS — Z131 Encounter for screening for diabetes mellitus: Secondary | ICD-10-CM | POA: Diagnosis not present

## 2022-01-10 DIAGNOSIS — J449 Chronic obstructive pulmonary disease, unspecified: Secondary | ICD-10-CM | POA: Diagnosis not present

## 2022-01-10 DIAGNOSIS — Z0000 Encounter for general adult medical examination without abnormal findings: Secondary | ICD-10-CM | POA: Diagnosis not present

## 2022-01-10 DIAGNOSIS — Z125 Encounter for screening for malignant neoplasm of prostate: Secondary | ICD-10-CM | POA: Diagnosis not present

## 2022-01-10 DIAGNOSIS — G4734 Idiopathic sleep related nonobstructive alveolar hypoventilation: Secondary | ICD-10-CM | POA: Diagnosis not present

## 2022-02-22 DIAGNOSIS — Z85819 Personal history of malignant neoplasm of unspecified site of lip, oral cavity, and pharynx: Secondary | ICD-10-CM | POA: Diagnosis not present

## 2022-05-03 DIAGNOSIS — J432 Centrilobular emphysema: Secondary | ICD-10-CM | POA: Diagnosis not present

## 2022-05-03 DIAGNOSIS — E039 Hypothyroidism, unspecified: Secondary | ICD-10-CM | POA: Diagnosis not present

## 2022-05-03 DIAGNOSIS — Z85818 Personal history of malignant neoplasm of other sites of lip, oral cavity, and pharynx: Secondary | ICD-10-CM | POA: Diagnosis not present

## 2022-06-25 DIAGNOSIS — Z85819 Personal history of malignant neoplasm of unspecified site of lip, oral cavity, and pharynx: Secondary | ICD-10-CM | POA: Diagnosis not present

## 2022-07-17 DIAGNOSIS — Z86007 Personal history of in-situ neoplasm of skin: Secondary | ICD-10-CM | POA: Diagnosis not present

## 2022-07-17 DIAGNOSIS — J449 Chronic obstructive pulmonary disease, unspecified: Secondary | ICD-10-CM | POA: Diagnosis not present

## 2022-07-17 DIAGNOSIS — E039 Hypothyroidism, unspecified: Secondary | ICD-10-CM | POA: Diagnosis not present

## 2022-07-17 DIAGNOSIS — J9611 Chronic respiratory failure with hypoxia: Secondary | ICD-10-CM | POA: Diagnosis not present

## 2022-07-17 DIAGNOSIS — Z1330 Encounter for screening examination for mental health and behavioral disorders, unspecified: Secondary | ICD-10-CM | POA: Diagnosis not present

## 2022-07-17 DIAGNOSIS — R7301 Impaired fasting glucose: Secondary | ICD-10-CM | POA: Diagnosis not present

## 2022-09-03 DIAGNOSIS — J439 Emphysema, unspecified: Secondary | ICD-10-CM | POA: Diagnosis not present

## 2022-09-18 ENCOUNTER — Other Ambulatory Visit: Payer: Self-pay

## 2022-09-18 DIAGNOSIS — C059 Malignant neoplasm of palate, unspecified: Secondary | ICD-10-CM

## 2022-09-18 NOTE — Progress Notes (Unsigned)
CLINIC:  Survivorship  REASON FOR VISIT:  Routine follow-up for history of head & neck cancer.  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  -Last TSH:  -Weight: (LOSS/GAIN) since (last visit)   -Last ENT visit:   -Last Rad Onc visit:  -Last Dentist visit:     ADDITIONAL REVIEW OF SYSTEMS:  ROS    CURRENT MEDICATIONS:  Current Outpatient Medications on File Prior to Visit  Medication Sig Dispense Refill   albuterol (PROVENTIL HFA;VENTOLIN HFA) 108 (90 BASE) MCG/ACT inhaler Inhale 1 puff into the lungs every 6 (six) hours as needed for wheezing.      alendronate (FOSAMAX) 70 MG tablet Take 70 mg by mouth every Wednesday.  0   aspirin EC 81 MG tablet Take 81 mg by mouth daily. Swallow whole.     clobetasol ointment (TEMOVATE) 0.05 % Apply 1 application topically 2 (two) times daily as needed (rash).      lactose free nutrition (BOOST) LIQD Take 237 mLs by mouth daily.     mupirocin cream (BACTROBAN) 2 % Apply topically 2 (two) times daily. Apply to rash on the back 15 g 0   OXYGEN Inhale 2 L into the lungs at bedtime.     polyethylene glycol (MIRALAX / GLYCOLAX) 17 g packet Take 17 g by mouth 2 (two) times daily. (Patient taking differently: Take 17 g by mouth daily as  needed for mild constipation.) 14 each 0   Tiotropium Bromide-Olodaterol (STIOLTO RESPIMAT) 2.5-2.5 MCG/ACT AERS Inhale 2 puffs into the lungs daily. 2 each 0   No current facility-administered medications on file prior to visit.    ALLERGIES:  No Known Allergies   PHYSICAL EXAM:  There were no vitals filed for this visit. There were no vitals filed for this visit.  Weight Date                     Pre-treatment (RT consult date):    General: Well-nourished, well-appearing male/male*** in no acute distress.  Accompanied/Unaccompanied today.  HEENT: Head is atraumatic and normocephalic.  Pupils equal and reactive to light. Conjunctivae clear without exudate.  Sclerae anicteric. Oral mucosa is pink and moist without lesions.  Tongue pink, moist, and midline. Oropharynx is pink and moist, without lesions. Lymph: No preauricular, postauricular, cervical, supraclavicular, or infraclavicular lymphadenopathy noted on palpation.   Neck: No palpable masses. Skin on neck is ***.  Cardiovascular: Normal rate and rhythm. Respiratory: Clear to auscultation bilaterally. Chest expansion symmetric without accessory muscle use; breathing non-labored.  GI: Abdomen soft and round. Non-tender, non-distended. Bowel sounds normoactive.  GU: Deferred.   Neuro: No focal deficits. Steady gait.   Psych: Normal mood and affect for situation. Extremities: No edema.  Skin:  Warm and dry.    LABORATORY DATA:  None at this visit.***  DIAGNOSTIC IMAGING:  None at this visit.    ASSESSMENT & PLAN:  Mr. Grunow is a pleasant 82 y.o. male/male*** with history of ***, diagnosed in ***(date);  treated with ***; completed treatment on (date).  Patient presents to survivorship clinic today for routine follow-up after finishing treatment.   1. Cancer***:  Mr. Swarr is clinically without evidence of disease or recurrence on physical exam today.     #. Nutritional status: Mr. Ocana reports that he is  currently able to consume adequate nutrition by mouth/via tube***.  Weight is stable at *** lbs today.  Encouraged to continue to consume adequate hydration and nutrition, as tolerated.    #. At risk for dysphagia: Given Mr. Punt's treatment for *** cancer, which included surgery and radiation therapy***, he is at risk for chronic dysphagia.  he reports having difficulty with breads and meats, but is able to consume soft foods and liquids without difficulty.  I encouraged him to continue to perform the swallowing exercises, as directed by Verdie Mosher, SLP.  If Mr. Macallister requires further swallowing or speech therapy evaluation, I will be happy to place that referral, if needed.  Currently, the patient's reported swallowing concerns are to be expected and stable.    #.  At risk for neck lymphedema:  When patients with head & neck cancers are treated with surgery and/or radiation therapy, there is an associated increased risk of neck lymphedema.  Mr. Morrisson reports that currently he is experiencing what would be considered mild symptoms. he does/does not*** have a neck compression garment and reports wearing/not wearing*** it, as directed.  I encouraged Mr. Utrera to continue to wear the compression garment and practice the massage techniques to reduce the presence of lymphedema.  If his symptoms worsen, I would happy to place a formal referral to physical therapy for further evaluation and treatment.     #.  At risk for hypothyroidism: The thyroid gland is often affected after treatment for head & neck cancer.  Mr. Leonhart most recent TSH was normal at *** on (date).  If appropriate, he will be prescribed thyroid supplement with Levothyroxine. We discussed that he will continue to have serial TSH monitoring for at least the next 5 years as part of his routine follow-up and post-cancer treatment care.   #. At risk for tooth decay/dental concerns: After treatment with radiation for head & neck cancers,  patients often experience xerostomia which increases their risk of dental caries. Mr. Surface was encouraged to see his dentist 3-4 times per year. The patient should also continue to use his fluoride trays daily, as directed by dentistry.  I encouraged him to reach out to either Dr. Kristin Bruins (oncology dentist) or his primary care dentist with additional questions or concerns.   #.  Lung cancer screening:  [place] now offers eligible patients lung cancer screening with a low-dose chest CT to aid in early detection, provide more effective treatment options, and ultimately improve survival benefits for patients diagnosed early.  Below is the selection criteria for screening:  Medicare patients: 55-77 years; privately insured patients 55-80 years. Active or former smokers who have quit within the last 15 years. 30+ pack-year history of smoking  Exclusion criteria - No signs/symptoms of lung cancer (i.e., no recent history of hemoptysis and no unexplained weight loss >15 pounds in the last 6 months). Willing and healthy enough to undergo biopsies/surgery if needed.  Mr. Schoof currently does/does not*** meet criteria for lung cancer screening.  Therefore, I have/have not*** placed a referral for screening today.    #. Tobacco & alcohol use: Mr. Padmanabhan reports that he quit smoking in *** and continues to abstain from all tobacco products.  I congratulated his continued efforts to remain tobacco free.  I also reinforced the importance of avoiding alcohol consumption as well.  Both tobacco and alcohol use in patients with head & neck cancer increases the risk of recurrence.  They also increase the risk of other cancers, as well.  Mr. Dearmon states that he voiced understanding of the importance of continuing to remain both tobacco and alcohol-free.    #. Health maintenance and wellness promotion: Cancer patients who consume a diet rich in fruits and vegetables have better overall health and decreased risk of  cancer recurrence. Mr. Helling was encouraged to consume 5-7 servings of fruits and vegetables per day, as tolerated. Mr. Nishiyama was also encouraged to engage in moderate to vigorous exercise for 30 minutes per day most days of the week.   #. Support services/counseling: It is not uncommon for this period of the patient's cancer care trajectory to be one of many emotions and stressors.  We discussed an opportunity for him to participate in the next session of Head & Neck FYNN ("Finding Your New Normal") support group series, designed for patients after they have completed treatment.   Mr. Fulmer was encouraged to take advantage of our many other support services programs, support groups, and/or counseling in coping with his new life as a cancer survivor after completing anti-cancer treatment. The patient was offered support today through active listening and expressive supportive counseling.     Dispo:  -See Dr. Marland Kitchen (ENT) in ***/2017 -Return to cancer center to see Dr. Basilio Cairo in ***/2017 -Return to cancer center to see Survivorship NP in ***.      A total of *** minutes was spent in the face-to-face care of this patient, with greater than 50% of that time spent in counseling and care-coordination.    Santiago Glad, NP Survivorship Program [facility] [phone]

## 2022-09-19 ENCOUNTER — Inpatient Hospital Stay: Payer: Medicare Other

## 2022-09-19 ENCOUNTER — Inpatient Hospital Stay: Payer: Medicare Other | Attending: Nurse Practitioner | Admitting: Nurse Practitioner

## 2022-09-19 ENCOUNTER — Other Ambulatory Visit: Payer: Self-pay

## 2022-09-19 ENCOUNTER — Encounter: Payer: Self-pay | Admitting: Nurse Practitioner

## 2022-09-19 VITALS — BP 122/63 | HR 65 | Temp 97.7°F | Wt 158.4 lb

## 2022-09-19 DIAGNOSIS — C760 Malignant neoplasm of head, face and neck: Secondary | ICD-10-CM | POA: Diagnosis not present

## 2022-09-19 DIAGNOSIS — C059 Malignant neoplasm of palate, unspecified: Secondary | ICD-10-CM | POA: Diagnosis not present

## 2022-09-19 DIAGNOSIS — Z79620 Long term (current) use of immunosuppressive biologic: Secondary | ICD-10-CM | POA: Diagnosis not present

## 2022-09-19 DIAGNOSIS — Z87891 Personal history of nicotine dependence: Secondary | ICD-10-CM | POA: Diagnosis not present

## 2022-09-19 DIAGNOSIS — Z923 Personal history of irradiation: Secondary | ICD-10-CM | POA: Insufficient documentation

## 2022-09-19 LAB — CMP (CANCER CENTER ONLY)
ALT: 9 U/L (ref 0–44)
AST: 17 U/L (ref 15–41)
Albumin: 3.8 g/dL (ref 3.5–5.0)
Alkaline Phosphatase: 43 U/L (ref 38–126)
Anion gap: 4 — ABNORMAL LOW (ref 5–15)
BUN: 10 mg/dL (ref 8–23)
CO2: 30 mmol/L (ref 22–32)
Calcium: 8.5 mg/dL — ABNORMAL LOW (ref 8.9–10.3)
Chloride: 106 mmol/L (ref 98–111)
Creatinine: 0.92 mg/dL (ref 0.61–1.24)
GFR, Estimated: >60 mL/min (ref >60)
Glucose, Bld: 93 mg/dL (ref 70–99)
Potassium: 4.2 mmol/L (ref 3.5–5.1)
Sodium: 140 mmol/L (ref 135–145)
Total Bilirubin: 1.7 mg/dL — ABNORMAL HIGH (ref 0.3–1.2)
Total Protein: 6.4 g/dL — ABNORMAL LOW (ref 6.5–8.1)

## 2022-09-19 LAB — CBC WITH DIFFERENTIAL (CANCER CENTER ONLY)
Abs Immature Granulocytes: 0.01 10*3/uL (ref 0.00–0.07)
Basophils Absolute: 0 10*3/uL (ref 0.0–0.1)
Basophils Relative: 0 %
Eosinophils Absolute: 0.2 10*3/uL (ref 0.0–0.5)
Eosinophils Relative: 5 %
HCT: 41.2 % (ref 39.0–52.0)
Hemoglobin: 13.8 g/dL (ref 13.0–17.0)
Immature Granulocytes: 0 %
Lymphocytes Relative: 20 %
Lymphs Abs: 0.9 10*3/uL (ref 0.7–4.0)
MCH: 31.2 pg (ref 26.0–34.0)
MCHC: 33.5 g/dL (ref 30.0–36.0)
MCV: 93.2 fL (ref 80.0–100.0)
Monocytes Absolute: 0.5 10*3/uL (ref 0.1–1.0)
Monocytes Relative: 11 %
Neutro Abs: 2.9 10*3/uL (ref 1.7–7.7)
Neutrophils Relative %: 64 %
Platelet Count: 192 10*3/uL (ref 150–400)
RBC: 4.42 MIL/uL (ref 4.22–5.81)
RDW: 13.2 % (ref 11.5–15.5)
WBC Count: 4.5 10*3/uL (ref 4.0–10.5)
nRBC: 0 % (ref 0.0–0.2)

## 2022-09-19 LAB — T4, FREE: Free T4: 1.52 ng/dL — ABNORMAL HIGH (ref 0.61–1.12)

## 2022-09-19 LAB — TSH: TSH: 1.619 u[IU]/mL (ref 0.350–4.500)

## 2022-09-23 ENCOUNTER — Telehealth: Payer: Self-pay

## 2022-09-23 NOTE — Telephone Encounter (Signed)
Lab results faxed to Molli Knock, PA-C at [facility].  Confirmation received

## 2022-09-23 NOTE — Telephone Encounter (Signed)
-----   Message from Pollyann Samples sent at 09/23/2022 10:43 AM EDT ----- Myriam Jacobson, please fax TSH/T4 results to PCP who manages synthroid.   Thanks, Clayborn Heron NP

## 2022-10-29 DIAGNOSIS — Z85819 Personal history of malignant neoplasm of unspecified site of lip, oral cavity, and pharynx: Secondary | ICD-10-CM | POA: Diagnosis not present

## 2022-10-29 DIAGNOSIS — H6123 Impacted cerumen, bilateral: Secondary | ICD-10-CM | POA: Diagnosis not present

## 2023-07-17 ENCOUNTER — Other Ambulatory Visit: Payer: Self-pay

## 2023-09-19 ENCOUNTER — Other Ambulatory Visit: Payer: Self-pay

## 2023-09-19 DIAGNOSIS — C059 Malignant neoplasm of palate, unspecified: Secondary | ICD-10-CM

## 2023-09-21 NOTE — Progress Notes (Unsigned)
 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   -Pain:  -Nutrition/Diet:  -Dysphagia?:  -Dental issues?: using fluoride trays?  -Last TSH:  -Weight: (LOSS/GAIN) since (last visit)   -Last ENT visit:  Jesus -Last Rad Onc visit: Izell 03/2021 now PRN  -Last Dentist visit:     ADDITIONAL REVIEW OF SYSTEMS:  ROS    CURRENT MEDICATIONS:  Current Outpatient Medications on File Prior to Visit  Medication Sig Dispense Refill   albuterol  (PROVENTIL  HFA;VENTOLIN  HFA) 108 (90 BASE) MCG/ACT inhaler Inhale 1 puff into the lungs every 6 (six) hours as needed for wheezing.      alendronate  (FOSAMAX ) 70 MG tablet Take 70 mg by mouth every Wednesday.  0   aspirin  EC 81 MG tablet Take 81 mg by mouth daily. Swallow whole.     clobetasol ointment (TEMOVATE) 0.05 % Apply 1 application topically 2 (two) times daily as needed (rash).      lactose free  nutrition (BOOST) LIQD Take 237 mLs by mouth daily.     mupirocin  cream (BACTROBAN ) 2 % Apply topically 2 (two) times daily. Apply to rash on the back 15 g 0   OXYGEN  Inhale 2 L into the lungs at bedtime.     polyethylene glycol (MIRALAX  / GLYCOLAX ) 17 g packet Take 17 g by mouth 2 (two) times daily. (Patient taking differently: Take 17 g by mouth daily as needed for mild constipation.) 14 each 0   Tiotropium Bromide -Olodaterol (STIOLTO RESPIMAT ) 2.5-2.5 MCG/ACT AERS Inhale 2 puffs into the lungs daily. 2 each 0   No current facility-administered medications on file prior to visit.    ALLERGIES:  No Known Allergies   PHYSICAL EXAM:  There were no vitals filed for this visit. There were no vitals filed for this visit.   General: Well-nourished, well-appearing male*** in no acute distress.  Accompanied/Unaccompanied today.  HEENT: Head is atraumatic and normocephalic.  Pupils equal and reactive to light. Conjunctivae clear without exudate.  Sclerae anicteric. Oral mucosa is pink and moist without lesions.  Tongue pink, moist, and midline. Oropharynx is pink and moist, without lesions. Lymph: No preauricular, postauricular, cervical, supraclavicular, or infraclavicular lymphadenopathy noted on palpation.   Neck: No palpable masses. Skin on neck is ***.  Cardiovascular: Normal rate and rhythm. Respiratory: Clear to auscultation bilaterally. Chest expansion symmetric without accessory muscle use; breathing non-labored.  GI: Abdomen soft and round. Non-tender, non-distended.  Bowel sounds normoactive.  GU: Deferred.   Neuro: No focal deficits. Steady gait.   Psych: Normal mood and affect for situation. Extremities: No edema.  Skin: Warm and dry.    LABORATORY DATA:  None at this visit.***  DIAGNOSTIC IMAGING:  None at this visit.    ASSESSMENT & PLAN:  Lonnie Schaefer is a pleasant 83 y.o. 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  Patient presents to survivorship clinic today for routine follow-up.  1. Cancer***:  Lonnie Schaefer is clinically without evidence of disease or recurrence on physical exam today.     #. Nutritional status: Lonnie Schaefer reports that he is currently able to consume adequate nutrition by mouth/via tube***.  Weight is stable *** lbs today.  Encouraged to continue to consume adequate hydration and nutrition, as tolerated.    #. At risk for dysphagia: Given Lonnie Schaefer treatment for *** cancer, which included surgery and radiation therapy***, he is at risk for chronic dysphagia.  he reports having difficulty with breads and meats, but is able to consume soft foods and liquids without difficulty.  I encouraged him to continue to perform the swallowing exercises, as directed by Lupita Connor, SLP.  If Lonnie Schaefer requires further swallowing or speech therapy evaluation, I will be happy to place that referral, if needed.  Currently, the patient's reported swallowing concerns are to be expected and stable.    #.  At risk for neck lymphedema:  When patients with head & neck cancers are treated with surgery and/or radiation therapy, there is an associated increased risk of neck lymphedema.  Lonnie Schaefer reports that currently he is experiencing what would be considered mild symptoms. he does/does not*** have a neck compression garment and reports wearing/not wearing*** it, as directed.  I encouraged Lonnie Schaefer to continue to wear the compression garment and practice the massage techniques to reduce the presence of lymphedema.  If his symptoms worsen, I would happy to place a formal referral to physical therapy for further evaluation and treatment.     #.  At risk for hypothyroidism: The thyroid  gland is often affected after treatment for head & neck cancer.  Lonnie Schaefer most recent TSH was normal at *** on (date).  If appropriate, he will be prescribed thyroid  supplement with  Levothyroxine. We discussed that he will continue to have serial TSH monitoring for at least the next 5 years as part of his routine follow-up and post-cancer treatment care.   #. At risk for tooth decay/dental concerns: After treatment with radiation for head & neck cancers, patients often experience xerostomia which increases their risk of dental caries. Lonnie Schaefer was encouraged to see his dentist 3-4 times per year. The patient should also continue to use his fluoride trays daily, as directed by dentistry.  I encouraged him to reach out to either Dr. Cyndee (oncology dentist) or his primary care dentist with additional questions or concerns.   #.  Lung cancer screening:  [facility] now offers eligible patients lung cancer screening with a low-dose chest CT to aid in early detection, provide more effective treatment options, and ultimately improve survival benefits for patients diagnosed early.  Below is the selection criteria for screening:  Medicare patients: 55-77 years; privately insured patients 55-80 years. Active or former smokers who have quit within the last 15 years. 30+ pack-year history of smoking  Exclusion criteria - No signs/symptoms of  lung cancer (i.e., no recent history of hemoptysis and no unexplained weight loss >15 pounds in the last 6 months). Willing and healthy enough to undergo biopsies/surgery if needed.  Lonnie Schaefer currently does/does not*** meet criteria for lung cancer screening.  Therefore, I have/have not*** placed a referral for screening today.    #. Tobacco & alcohol use: Lonnie Schaefer reports that he quit smoking in *** and continues to abstain from all tobacco products.  I congratulated his continued efforts to remain tobacco free.  I also reinforced the importance of avoiding alcohol consumption as well.  Both tobacco and alcohol use in patients with head & neck cancer increases the risk of recurrence.  They also increase the risk of other cancers, as well.  Mr.  Schaefer states that he voiced understanding of the importance of continuing to remain both tobacco and alcohol-free.    #. Health maintenance and wellness promotion: Cancer patients who consume a diet rich in fruits and vegetables have better overall health and decreased risk of cancer recurrence. Lonnie Schaefer was encouraged to consume 5-7 servings of fruits and vegetables per day, as tolerated. Lonnie Schaefer was also encouraged to engage in moderate to vigorous exercise for 30 minutes per day most days of the week.   #. Support services/counseling: It is not uncommon for this period of the patient's cancer care trajectory to be one of many emotions and stressors.  We discussed an opportunity for him to participate in the next session of Head & Neck FYNN (Finding Your New Normal) support group series, designed for patients after they have completed treatment.   Lonnie Schaefer was encouraged to take advantage of our many other support services programs, support groups, and/or counseling in coping with his new life as a cancer survivor after completing anti-cancer treatment. The patient was offered support today through active listening and expressive supportive counseling.     Dispo:  -See Dr. PIERRETTE (ENT) in ***/2017 -Return to cancer center to see Dr. Izell in ***/2017 -Return to cancer center to see Survivorship NP in ***.      A total of *** minutes was spent in the face-to-face care of this patient, with greater than 50% of that time spent in counseling and care-coordination.    Lonnie Stettler, NP Survivorship Program [facility] [phone]

## 2023-09-22 ENCOUNTER — Encounter: Payer: Self-pay | Admitting: Nurse Practitioner

## 2023-09-22 ENCOUNTER — Inpatient Hospital Stay: Payer: Medicare Other | Admitting: Nurse Practitioner

## 2023-09-22 ENCOUNTER — Inpatient Hospital Stay: Payer: Medicare Other | Attending: Nurse Practitioner

## 2023-09-22 ENCOUNTER — Ambulatory Visit: Payer: Self-pay | Admitting: Nurse Practitioner

## 2023-09-22 VITALS — BP 118/60 | HR 61 | Temp 97.6°F | Resp 17 | Wt 151.5 lb

## 2023-09-22 DIAGNOSIS — Z79620 Long term (current) use of immunosuppressive biologic: Secondary | ICD-10-CM | POA: Diagnosis not present

## 2023-09-22 DIAGNOSIS — Z923 Personal history of irradiation: Secondary | ICD-10-CM | POA: Insufficient documentation

## 2023-09-22 DIAGNOSIS — Z87891 Personal history of nicotine dependence: Secondary | ICD-10-CM | POA: Insufficient documentation

## 2023-09-22 DIAGNOSIS — R682 Dry mouth, unspecified: Secondary | ICD-10-CM | POA: Insufficient documentation

## 2023-09-22 DIAGNOSIS — C059 Malignant neoplasm of palate, unspecified: Secondary | ICD-10-CM | POA: Insufficient documentation

## 2023-09-22 LAB — CBC WITH DIFFERENTIAL (CANCER CENTER ONLY)
Abs Immature Granulocytes: 0.02 K/uL (ref 0.00–0.07)
Basophils Absolute: 0 K/uL (ref 0.0–0.1)
Basophils Relative: 0 %
Eosinophils Absolute: 0.2 K/uL (ref 0.0–0.5)
Eosinophils Relative: 6 %
HCT: 38.6 % — ABNORMAL LOW (ref 39.0–52.0)
Hemoglobin: 13 g/dL (ref 13.0–17.0)
Immature Granulocytes: 1 %
Lymphocytes Relative: 23 %
Lymphs Abs: 1 K/uL (ref 0.7–4.0)
MCH: 31.4 pg (ref 26.0–34.0)
MCHC: 33.7 g/dL (ref 30.0–36.0)
MCV: 93.2 fL (ref 80.0–100.0)
Monocytes Absolute: 0.5 K/uL (ref 0.1–1.0)
Monocytes Relative: 11 %
Neutro Abs: 2.5 K/uL (ref 1.7–7.7)
Neutrophils Relative %: 59 %
Platelet Count: 157 K/uL (ref 150–400)
RBC: 4.14 MIL/uL — ABNORMAL LOW (ref 4.22–5.81)
RDW: 13.4 % (ref 11.5–15.5)
WBC Count: 4.2 K/uL (ref 4.0–10.5)
nRBC: 0 % (ref 0.0–0.2)

## 2023-09-22 LAB — CMP (CANCER CENTER ONLY)
ALT: 10 U/L (ref 0–44)
AST: 16 U/L (ref 15–41)
Albumin: 3.8 g/dL (ref 3.5–5.0)
Alkaline Phosphatase: 38 U/L (ref 38–126)
Anion gap: 4 — ABNORMAL LOW (ref 5–15)
BUN: 12 mg/dL (ref 8–23)
CO2: 29 mmol/L (ref 22–32)
Calcium: 8.8 mg/dL — ABNORMAL LOW (ref 8.9–10.3)
Chloride: 107 mmol/L (ref 98–111)
Creatinine: 0.83 mg/dL (ref 0.61–1.24)
GFR, Estimated: >60 mL/min (ref >60)
Glucose, Bld: 97 mg/dL (ref 70–99)
Potassium: 4.6 mmol/L (ref 3.5–5.1)
Sodium: 140 mmol/L (ref 135–145)
Total Bilirubin: 1.5 mg/dL — ABNORMAL HIGH (ref 0.0–1.2)
Total Protein: 6.3 g/dL — ABNORMAL LOW (ref 6.5–8.1)

## 2023-09-22 LAB — TSH: TSH: 1.22 u[IU]/mL (ref 0.350–4.500)

## 2023-09-22 LAB — T4, FREE: Free T4: 1.34 ng/dL — ABNORMAL HIGH (ref 0.61–1.12)

## 2023-09-23 NOTE — Telephone Encounter (Addendum)
 Called patient to relay message below as per Lacie Burton NP, spoke with patients daughter she voiced full understanding and had no further questions at this time.  ----- Message from Lacie K Burton sent at 09/22/2023  1:56 PM EDT ----- Please let pt know to continue current dose synthroid.  Thanks Lacie NP ----- Message ----- From: Rebecka, [facility] [place] Sent: 09/22/2023  10:22 AM EDT To: Lacie K Burton, NP

## 2023-11-30 ENCOUNTER — Emergency Department
Admission: EM | Admit: 2023-11-30 | Discharge: 2023-11-30 | Disposition: A | Discharge status: Home / Self Care | Attending: Emergency Medicine | Admitting: Emergency Medicine

## 2023-11-30 DIAGNOSIS — J449 Chronic obstructive pulmonary disease, unspecified: Secondary | ICD-10-CM | POA: Insufficient documentation

## 2023-11-30 DIAGNOSIS — I499 Cardiac arrhythmia, unspecified: Secondary | ICD-10-CM | POA: Diagnosis present

## 2023-11-30 DIAGNOSIS — I493 Ventricular premature depolarization: Secondary | ICD-10-CM | POA: Diagnosis not present

## 2023-11-30 LAB — CBC
HCT: 40.2 % (ref 39.0–52.0)
Hemoglobin: 13.2 g/dL (ref 13.0–17.0)
MCH: 31.2 pg (ref 26.0–34.0)
MCHC: 32.8 g/dL (ref 30.0–36.0)
MCV: 95 fL (ref 80.0–100.0)
Platelets: 203 K/uL (ref 150–400)
RBC: 4.23 MIL/uL (ref 4.22–5.81)
RDW: 13.3 % (ref 11.5–15.5)
WBC: 5.6 K/uL (ref 4.0–10.5)
nRBC: 0 % (ref 0.0–0.2)

## 2023-11-30 LAB — BASIC METABOLIC PANEL WITH GFR
Anion gap: 8 (ref 5–15)
BUN: 15 mg/dL (ref 8–23)
CO2: 25 mmol/L (ref 22–32)
Calcium: 8.8 mg/dL — ABNORMAL LOW (ref 8.9–10.3)
Chloride: 106 mmol/L (ref 98–111)
Creatinine, Ser: 0.88 mg/dL (ref 0.61–1.24)
GFR, Estimated: >60 mL/min (ref >60)
Glucose, Bld: 93 mg/dL (ref 70–99)
Potassium: 4.6 mmol/L (ref 3.5–5.1)
Sodium: 139 mmol/L (ref 135–145)

## 2023-11-30 LAB — MAGNESIUM: Magnesium: 2.2 mg/dL (ref 1.7–2.4)

## 2023-11-30 NOTE — ED Triage Notes (Signed)
 Pt presents to the ED via POV from home. Pt reports that he had [facility] come to his house on Friday and was listening to his lungs during his examination and told him that he thought that he heard an irregular heart beat. Pt had a wedding to attend yesterday. Denies sx's. Decided to come in today to get checked out.

## 2023-11-30 NOTE — ED Provider Notes (Signed)
 [facility] Provider Note    Event Date/Time   First MD Initiated Contact with Patient 11/30/23 1031     (approximate)  History   Chief Complaint: Irregular Heart Beat  HPI  Lonnie Schaefer is a 83 y.o. male with a past medical history of COPD, presents to the emergency department for an irregular heartbeat.  According to the patient he has a home health nurse that checks on him.  States they listen to his heart on Friday and noted that it sounded irregular and wanted him to come to the emergency department to get evaluated.  Patient states no symptoms denies any chest pain shortness of breath he is not aware of the palpitations.  Physical Exam   Triage Vital Signs: ED Triage Vitals  Encounter Vitals Group     BP 11/30/23 1027 138/75     Girls Systolic BP Percentile --      Girls Diastolic BP Percentile --      Boys Systolic BP Percentile --      Boys Diastolic BP Percentile --      Pulse Rate 11/30/23 1027 67     Resp 11/30/23 1027 18     Temp 11/30/23 1027 97.8 F (36.6 C)     Temp Source 11/30/23 1027 Oral     SpO2 11/30/23 1027 92 %     Weight 11/30/23 1024 145 lb (65.8 kg)     Height 11/30/23 1024 5' 10 (1.778 m)     Head Circumference --      Peak Flow --      Pain Score 11/30/23 1023 0     Pain Loc --      Pain Education --      Exclude from Growth Chart --     Most recent vital signs: Vitals:   11/30/23 1027  BP: 138/75  Pulse: 67  Resp: 18  Temp: 97.8 F (36.6 C)  SpO2: 92%    General: Awake, no distress.  CV:  Good peripheral perfusion.  Somewhat irregular rhythm around 70 or 80 bpm. Resp:  Normal effort.  Equal breath sounds bilaterally.  Abd:  No distention.  Soft  ED Results / Procedures / Treatments   EKG  EKG viewed and interpreted by myself appears to show a sinus rhythm at 67 bpm with premature ventricular complexes every 2-4 beats.  Narrow QRS, normal axis, normal intervals, no concerning ST  changes.  MEDICATIONS ORDERED IN ED: Medications - No data to display   IMPRESSION / MDM / ASSESSMENT AND PLAN / ED COURSE  I reviewed the triage vital signs and the nursing notes.  Patient's presentation is most consistent with acute presentation with potential threat to life or bodily function.  Patient presents to the emergency department for irregular heartbeat found by his home health nurse.  Here the patient appears well he has no symptoms.  Does not feel any ectopic beats or irregular heartbeat does not feel any shortness of breath or chest pain.  EKG appears to show PVCs and an otherwise sinus rhythm.  We will check labs including electrolytes and magnesium  level.  We will continue to closely monitor on cardiac telemetry.  If the patient's labs do not show any significant finding anticipate the patient could be discharged home with cardiology follow-up for a Holter monitor.  Patient agreeable to plan of care.  Patient's workup shows a reassuring CBC with a reassuring chemistry reassuring electrolytes and a normal magnesium  level.  Given the  patient's reassuring workup I believe the patient safe for discharge home.  Will refer to cardiology for consideration of a Holter monitor.  Patient agreeable to plan of care.  FINAL CLINICAL IMPRESSION(S) / ED DIAGNOSES   PVCs Palpitations   Note:  This document was prepared using Dragon voice recognition software and may include unintentional dictation errors.   Dorothyann Drivers, MD 11/30/23 1154

## 2023-11-30 NOTE — ED Notes (Signed)
 Fall risk bracelet on, bed alarm on, and call bell near pt

## 2023-11-30 NOTE — Discharge Instructions (Addendum)
 Please call the number provided for cardiology to arrange a follow-up appointment for consideration of a Holter monitor.  Return to the emergency department for any chest pain, shortness of breath or any other symptom concerning to yourself.

## 2023-12-12 ENCOUNTER — Other Ambulatory Visit: Payer: Self-pay | Admitting: Internal Medicine

## 2023-12-12 DIAGNOSIS — I493 Ventricular premature depolarization: Secondary | ICD-10-CM

## 2023-12-12 DIAGNOSIS — R079 Chest pain, unspecified: Secondary | ICD-10-CM

## 2023-12-30 ENCOUNTER — Encounter (HOSPITAL_COMMUNITY): Payer: Self-pay

## 2024-01-02 ENCOUNTER — Telehealth (HOSPITAL_COMMUNITY): Payer: Self-pay | Admitting: *Deleted

## 2024-01-02 NOTE — Telephone Encounter (Signed)
 Reaching out to patient's daughter (per pt request) to offer assistance regarding upcoming cardiac imaging study; pt's daughter verbalizes understanding of appt date/time, parking situation and where to check in, pre-test NPO status, and verified current allergies; name and call back number provided for further questions should they arise  Chantal Requena RN Navigator Cardiac Imaging [facility] [phone] office [phone] cell

## 2024-01-03 NOTE — Procedures (Signed)
 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

## 2024-01-05 ENCOUNTER — Ambulatory Visit
Admission: RE | Admit: 2024-01-05 | Discharge: 2024-01-05 | Disposition: A | Discharge status: Home / Self Care | Source: Ambulatory Visit | Attending: Internal Medicine | Admitting: Internal Medicine

## 2024-01-05 DIAGNOSIS — R079 Chest pain, unspecified: Secondary | ICD-10-CM

## 2024-01-05 DIAGNOSIS — I493 Ventricular premature depolarization: Secondary | ICD-10-CM

## 2024-01-05 MED ORDER — NITROGLYCERIN 0.4 MG SL SUBL
0.8000 mg | SUBLINGUAL_TABLET | Freq: Once | SUBLINGUAL | Status: DC
  Filled 2024-01-05: qty 25

## 2024-01-05 MED ORDER — DILTIAZEM HCL 25 MG/5ML IV SOLN: 10.0000 mg | INTRAVENOUS | Status: DC | PRN

## 2024-01-05 MED ORDER — IOHEXOL 350 MG/ML SOLN: 100.0000 mL | Freq: Once | INTRAVENOUS | Status: DC | PRN

## 2024-01-05 NOTE — Progress Notes (Signed)
 Pt came in for cardiac CT with HR in the mid 80s, rhythm alternating between bigeminy and trigeminy. No premedication was ordered.   MD Pendyal aware and stated that pt needs to be rescheduled with pre-med for arrhythmia/decreasing HR or consider alternative cardiac scans. MD Pendyal will communicate this with pt's cardiac team. Pt and family made aware and updated, verbalized understanding and all questions answered by this RN. Scan was not completed.

## 2024-01-14 NOTE — Progress Notes (Addendum)
 [facility] PH[facility]ICIAN NOTE  REFERRING PHYSICIANS/PROVIDERS:   Patient Care Team    Relationship Specialty Notifications Start End  Medicine, [facility] PCP - General Family Medicine  12/09/23    Address:  [address], [address] [place] [address]  Florencio Cara Endow  Electrocardiogram and rhythm strip (today): [facility] interpretation follows:]  SR w freq pvcs, v couplet ; 77 bpm pr 160 qrs 78 PVC + in 2, 3, F, V3-6; QS in aVR, aVL; rS in V1V2 c/w LVOT   PRIOR: -08/04/2019 1735  S tach 105 pr 158 qrs 72 -10/16/20 1242 nsr 75 pr 174 qrs 76 -02/24/2020 1055 sr 86 one pvc pos in II, III, neg in I weakly; negative low amplitude w shaped in V1.  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 ECG a few years ago mentioned a PVC, a year later no pvcs noted. No palpitations. Echo normal EF but moderate MR and mod AR. CTA posponed for heart rate; pvcs may impact too. Although asymptomatic will see if can suppress at least for CTA with ranolazine; if not suppressed at f/u ECG consider flecainide for CTA. Would avoid amio given copd and home o2 and likely avoid ablation for same reason and no symptoms. If ranola ineffective probably no Rx for now and follow EF though the moderate MR may make the EF # look better. If no CAD consider flecainide; also could consider mexiletine. BBL and dilt could be tried but less likely to help and more likely to cause s/e than ranolazine.  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 Copd on o2 at night   RECOMMENDATIONS and PLAN:   Ranolazine 500 bid  RN visit 1-2 weeks with ecg (and rhythm strip) for effect on pvcs  Consider flecainide for CTA   Return 3 months  DEMOGRAPHIC INFORMATION   Patient Profile: Lonnie Schaefer is a 83 y.o. male The patient resides in [place] in the county of  [place]    HISTORY:  Chief Complaint: he seen for evaluation of   pvcs  History of Present Illness Lonnie Schaefer is an 83 year old male who presents with an incidental finding of premature ventricular complexes (PVCs).  During a routine annual visit by a home health nurse, an irregular heartbeat was detected, leading to the discovery of premature ventricular complexes (PVCs) on an EKG. He was asymptomatic and unaware of the extra heartbeats prior to this finding. No symptoms such as syncope, chest pain, or palpitations have been experienced.  He has a history of COPD and uses oxygen  at night (two liters) and an inhaler (two puffs in the morning). He reports no recent worsening of his breathing.  His past medical history includes a visit to the emergency room in January 2022 for shortness of breath, generalized weakness, dizziness, and low blood pressure, possibly due to dehydration. 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  He is currently taking Synthroid 75 micrograms daily, aspirin  once a day, Fosamax  once a week, and Miralax  once a day.  There is no family history of heart rhythm problems or sudden death at a young age.     Family history of arrhythmias, sudden death, implanted devices or inherited CV disease:  No scd or arrhythmia  Other critical historical data relating to future EP Procedures:  Any of the following: no  Anesthesia or airway higher  risk  Prior surgical complications Contraindications to MRI  // CTA OR / TEE Potential need for stress dose steroids OR recent/ongoing immunosuppressive therapy: Review of prior external notes and  results:  12/09/23 1. PVC (premature ventricular contraction)   2. Palpitations   3. Chronic obstructive pulmonary disease, unspecified COPD type (CMS/HHS-HCC)   4. Chest pain at rest     Echo 12/22/23 EF 55, calc 57 Mod MR Mod AR  Mild TR Rvsp 48 Mod-severe AR  Holter 12/09/23 3d Avg hr 79 103,360 VE burden 40% (57% morphol 1, 43% morph 2) 5 nsvt longest  3 bts 254 sve <1% 3 svt longest 5 bts   Medications:  has a current medication list which includes the following prescription(s): albuterol  mdi (proventil , ventolin , proair ) hfa, alendronate , aspirin , helium/oxygen , levothyroxine, pedi nutrition,iron,lact-free, polyethylene glycol, and stiolto respimat .  EXAMINATION:  Vital Signs: Vitals:   01/14/24 1018  BP: (!) 146/72  Pulse: 52  SpO2: 92%  Weight: 70.4 kg (155 lb 3.2 oz)  Height: 177.8 cm (5' 10)   Body mass index is 22.27 kg/m. Lungs dcrsd bs Cv irregular ADMINISTRATIVE:  This note was generated in part with voice recognition software; all errors may not have been discovered and edited. TIME: I spent a total of 46-63minutes in both face-to-face or non-face-to-face activities, excluding procedures performed, on date of encounter.  James P. Daubert, MD Professor of Medicine Clinical Cardiac Electrophysiology [facility]

## 2024-01-22 ENCOUNTER — Ambulatory Visit

## 2024-01-25 ENCOUNTER — Other Ambulatory Visit: Payer: Self-pay

## 2024-01-25 ENCOUNTER — Emergency Department
Admission: EM | Admit: 2024-01-25 | Discharge: 2024-01-25 | Disposition: A | Discharge status: Home / Self Care | Attending: Emergency Medicine | Admitting: Emergency Medicine

## 2024-01-25 ENCOUNTER — Emergency Department

## 2024-01-25 DIAGNOSIS — R42 Dizziness and giddiness: Secondary | ICD-10-CM | POA: Insufficient documentation

## 2024-01-25 DIAGNOSIS — R059 Cough, unspecified: Secondary | ICD-10-CM | POA: Diagnosis present

## 2024-01-25 DIAGNOSIS — R0602 Shortness of breath: Secondary | ICD-10-CM | POA: Diagnosis not present

## 2024-01-25 DIAGNOSIS — B349 Viral infection, unspecified: Secondary | ICD-10-CM | POA: Insufficient documentation

## 2024-01-25 DIAGNOSIS — E860 Dehydration: Secondary | ICD-10-CM | POA: Diagnosis not present

## 2024-01-25 DIAGNOSIS — H9319 Tinnitus, unspecified ear: Secondary | ICD-10-CM | POA: Diagnosis not present

## 2024-01-25 HISTORY — DX: Atherosclerotic heart disease of native coronary artery without angina pectoris: I25.10

## 2024-01-25 LAB — CBC
HCT: 43.5 % (ref 39.0–52.0)
Hemoglobin: 14.5 g/dL (ref 13.0–17.0)
MCH: 30.9 pg (ref 26.0–34.0)
MCHC: 33.3 g/dL (ref 30.0–36.0)
MCV: 92.8 fL (ref 80.0–100.0)
Platelets: 220 K/uL (ref 150–400)
RBC: 4.69 MIL/uL (ref 4.22–5.81)
RDW: 12.8 % (ref 11.5–15.5)
WBC: 7.3 K/uL (ref 4.0–10.5)
nRBC: 0 % (ref 0.0–0.2)

## 2024-01-25 LAB — TROPONIN T, HIGH SENSITIVITY
Troponin T High Sensitivity: 15 ng/L (ref 0–19)
Troponin T High Sensitivity: <15 ng/L (ref 0–19)

## 2024-01-25 LAB — RESP PANEL BY RT-PCR (RSV, FLU A&B, COVID)  RVPGX2
Influenza A by PCR: NEGATIVE
Influenza B by PCR: NEGATIVE
Resp Syncytial Virus by PCR: NEGATIVE
SARS Coronavirus 2 by RT PCR: NEGATIVE

## 2024-01-25 LAB — BASIC METABOLIC PANEL WITH GFR
Anion gap: 9 (ref 5–15)
BUN: 15 mg/dL (ref 8–23)
CO2: 26 mmol/L (ref 22–32)
Calcium: 9 mg/dL (ref 8.9–10.3)
Chloride: 102 mmol/L (ref 98–111)
Creatinine, Ser: 1.03 mg/dL (ref 0.61–1.24)
GFR, Estimated: >60 mL/min
Glucose, Bld: 106 mg/dL — ABNORMAL HIGH (ref 70–99)
Potassium: 4.7 mmol/L (ref 3.5–5.1)
Sodium: 136 mmol/L (ref 135–145)

## 2024-01-25 MED ORDER — SODIUM CHLORIDE 0.9 % IV BOLUS
500.0000 mL | Freq: Once | INTRAVENOUS | Status: AC
  Administered 2024-01-25: 500 mL via INTRAVENOUS

## 2024-01-25 MED ORDER — IPRATROPIUM-ALBUTEROL 0.5-2.5 (3) MG/3ML IN SOLN
3.0000 mL | Freq: Once | RESPIRATORY_TRACT | Status: AC
  Administered 2024-01-25: 3 mL via RESPIRATORY_TRACT
  Filled 2024-01-25: qty 3

## 2024-01-25 MED ORDER — ALBUTEROL SULFATE HFA 108 (90 BASE) MCG/ACT IN AERS: 2.0000 | INHALATION_SPRAY | Freq: Four times a day (QID) | RESPIRATORY_TRACT | 2 refills | Status: DC | PRN

## 2024-01-25 NOTE — ED Provider Notes (Signed)
 "  [facility] Provider Note    Event Date/Time   First MD Initiated Contact with Patient 01/25/24 1232     (approximate)   History   Shortness of Breath, Dizziness, and tinnitis   HPI  Lonnie Schaefer is a 83 y.o. male who presents to the emergency department today with primary concern for some cough and shortness of breath.  Patient states that he has been coughing up clear phlegm.  This started yesterday.  Additionally he is had some mild nausea, fatigue and ears ringing today.  Patient did wonder if it could be related to a new medication he was started on 3 days ago.  The patient denies any known sick contacts     Physical Exam   Triage Vital Signs: ED Triage Vitals  Encounter Vitals Group     BP 01/25/24 0821 121/65     Girls Systolic BP Percentile --      Girls Diastolic BP Percentile --      Boys Systolic BP Percentile --      Boys Diastolic BP Percentile --      Pulse Rate 01/25/24 0821 80     Resp 01/25/24 0821 (!) 22     Temp 01/25/24 0825 98.4 F (36.9 C)     Temp Source 01/25/24 0821 Oral     SpO2 01/25/24 0821 93 %     Weight 01/25/24 0820 154 lb (69.9 kg)     Height 01/25/24 0820 5' 10 (1.778 m)     Head Circumference --      Peak Flow --      Pain Score 01/25/24 0818 0     Pain Loc --      Pain Education --      Exclude from Growth Chart --     Most recent vital signs: Vitals:   01/25/24 1150 01/25/24 1200  BP:  (!) 122/59  Pulse:  64  Resp:  (!) 22  Temp:    SpO2: 97% 96%   General: Awake, alert, oriented. CV:  Good peripheral perfusion. Regular rate and rhythm. Resp:  Normal effort. Mild expiratory wheezing. Abd:  No distention.   ED Results / Procedures / Treatments   Labs (all labs ordered are listed, but only abnormal results are displayed) Labs Reviewed  BASIC METABOLIC PANEL WITH GFR - Abnormal; Notable for the following components:      Result Value   Glucose, Bld 106 (*)    All other components within  normal limits  RESP PANEL BY RT-PCR (RSV, FLU A&B, COVID)  RVPGX2  CBC  TROPONIN T, HIGH SENSITIVITY  TROPONIN T, HIGH SENSITIVITY     EKG  I, Guadalupe Eagles, attending physician, personally viewed and interpreted this EKG  EKG Time: 0823 Rate: 80 Rhythm: normal sinus rhythm Axis: normal Intervals: qtc 454 QRS: narrow, q waves v1, v2 ST changes: no st elevation Impression: abnormal ekg    RADIOLOGY I independently interpreted and visualized the CXR. My interpretation: No pneumonia Radiology interpretation:  IMPRESSION:  1. No acute findings.     PROCEDURES:  Critical Care performed: No   MEDICATIONS ORDERED IN ED: Medications - No data to display   IMPRESSION / MDM / ASSESSMENT AND PLAN / ED COURSE  I reviewed the triage vital signs and the nursing notes.                              Differential  diagnosis includes, but is not limited to, covid, influenza, ACS, pneumonia  Patient's presentation is most consistent with acute presentation with potential threat to life or bodily function.   Patient presented to the emergency department today because concerns for cough and shortness of breath.  Also has complaints of some ringing in his ears and generalized fatigue.  On exam patient is awake and alert.  Did have some minimal expiratory wheezing.  Chest x-ray without pneumonia.  Troponin negative x 2.  No concerning leukocytosis or fever.  Patient was given DuoNeb treatment and some IV fluids here.  He stated he felt significant improvement after the treatments.  At this time I think patient likely suffering from a viral illness.  Will plan on discharging with albuterol  inhaler.  Encourage patient to follow-up with primary care and return for any worsening symptoms.     FINAL CLINICAL IMPRESSION(S) / ED DIAGNOSES   Final diagnoses:  Dehydration  Cough, unspecified type  Viral illness    Note:  This document was prepared using Dragon voice recognition software  and may include unintentional dictation errors.    Floy Roberts, MD 01/25/24 [phone]  "

## 2024-01-25 NOTE — ED Triage Notes (Signed)
 Pt to ED for nausea, ears ringing, dizzy, and SOB since yesterday. States PCP put him on a a new heart medicine last week and pt unsure if related. Pt is alert, oriented X4. Speech clear. Respirations unlabored, skin dry.

## 2024-02-14 ENCOUNTER — Emergency Department

## 2024-02-14 ENCOUNTER — Inpatient Hospital Stay
Admission: EM | Admit: 2024-02-14 | Discharge: 2024-03-07 | DRG: 335 | Disposition: E | Attending: Family Medicine | Admitting: Family Medicine

## 2024-02-14 ENCOUNTER — Other Ambulatory Visit: Payer: Self-pay

## 2024-02-14 DIAGNOSIS — R0603 Acute respiratory distress: Secondary | ICD-10-CM | POA: Diagnosis not present

## 2024-02-14 DIAGNOSIS — I468 Cardiac arrest due to other underlying condition: Secondary | ICD-10-CM | POA: Diagnosis not present

## 2024-02-14 DIAGNOSIS — E039 Hypothyroidism, unspecified: Secondary | ICD-10-CM | POA: Diagnosis present

## 2024-02-14 DIAGNOSIS — K4020 Bilateral inguinal hernia, without obstruction or gangrene, not specified as recurrent: Secondary | ICD-10-CM | POA: Diagnosis present

## 2024-02-14 DIAGNOSIS — Z1152 Encounter for screening for COVID-19: Secondary | ICD-10-CM | POA: Diagnosis not present

## 2024-02-14 DIAGNOSIS — K5730 Diverticulosis of large intestine without perforation or abscess without bleeding: Secondary | ICD-10-CM | POA: Diagnosis present

## 2024-02-14 DIAGNOSIS — I2510 Atherosclerotic heart disease of native coronary artery without angina pectoris: Secondary | ICD-10-CM | POA: Diagnosis present

## 2024-02-14 DIAGNOSIS — K56609 Unspecified intestinal obstruction, unspecified as to partial versus complete obstruction: Secondary | ICD-10-CM | POA: Diagnosis not present

## 2024-02-14 DIAGNOSIS — K5651 Intestinal adhesions [bands], with partial obstruction: Principal | ICD-10-CM | POA: Diagnosis present

## 2024-02-14 DIAGNOSIS — Z66 Do not resuscitate: Secondary | ICD-10-CM | POA: Diagnosis present

## 2024-02-14 DIAGNOSIS — M810 Age-related osteoporosis without current pathological fracture: Secondary | ICD-10-CM | POA: Diagnosis present

## 2024-02-14 DIAGNOSIS — N179 Acute kidney failure, unspecified: Secondary | ICD-10-CM | POA: Diagnosis present

## 2024-02-14 DIAGNOSIS — J449 Chronic obstructive pulmonary disease, unspecified: Secondary | ICD-10-CM | POA: Diagnosis not present

## 2024-02-14 DIAGNOSIS — Z923 Personal history of irradiation: Secondary | ICD-10-CM

## 2024-02-14 DIAGNOSIS — Z8711 Personal history of peptic ulcer disease: Secondary | ICD-10-CM | POA: Diagnosis not present

## 2024-02-14 DIAGNOSIS — R112 Nausea with vomiting, unspecified: Secondary | ICD-10-CM | POA: Diagnosis not present

## 2024-02-14 DIAGNOSIS — J988 Other specified respiratory disorders: Secondary | ICD-10-CM | POA: Diagnosis present

## 2024-02-14 DIAGNOSIS — K567 Ileus, unspecified: Secondary | ICD-10-CM | POA: Diagnosis not present

## 2024-02-14 DIAGNOSIS — Z7983 Long term (current) use of bisphosphonates: Secondary | ICD-10-CM | POA: Diagnosis not present

## 2024-02-14 DIAGNOSIS — Z9981 Dependence on supplemental oxygen: Secondary | ICD-10-CM | POA: Diagnosis not present

## 2024-02-14 DIAGNOSIS — J441 Chronic obstructive pulmonary disease with (acute) exacerbation: Secondary | ICD-10-CM | POA: Diagnosis not present

## 2024-02-14 DIAGNOSIS — E43 Unspecified severe protein-calorie malnutrition: Secondary | ICD-10-CM | POA: Diagnosis present

## 2024-02-14 DIAGNOSIS — R339 Retention of urine, unspecified: Secondary | ICD-10-CM | POA: Diagnosis not present

## 2024-02-14 DIAGNOSIS — C050 Malignant neoplasm of hard palate: Secondary | ICD-10-CM | POA: Diagnosis present

## 2024-02-14 DIAGNOSIS — K439 Ventral hernia without obstruction or gangrene: Secondary | ICD-10-CM | POA: Diagnosis present

## 2024-02-14 DIAGNOSIS — Z7982 Long term (current) use of aspirin: Secondary | ICD-10-CM

## 2024-02-14 DIAGNOSIS — Z79890 Hormone replacement therapy (postmenopausal): Secondary | ICD-10-CM | POA: Diagnosis not present

## 2024-02-14 DIAGNOSIS — R0902 Hypoxemia: Secondary | ICD-10-CM | POA: Diagnosis present

## 2024-02-14 DIAGNOSIS — D72829 Elevated white blood cell count, unspecified: Secondary | ICD-10-CM | POA: Diagnosis present

## 2024-02-14 DIAGNOSIS — I2699 Other pulmonary embolism without acute cor pulmonale: Secondary | ICD-10-CM | POA: Diagnosis not present

## 2024-02-14 DIAGNOSIS — Z87891 Personal history of nicotine dependence: Secondary | ICD-10-CM

## 2024-02-14 DIAGNOSIS — K4090 Unilateral inguinal hernia, without obstruction or gangrene, not specified as recurrent: Secondary | ICD-10-CM

## 2024-02-14 DIAGNOSIS — Z6822 Body mass index (BMI) 22.0-22.9, adult: Secondary | ICD-10-CM

## 2024-02-14 DIAGNOSIS — R197 Diarrhea, unspecified: Secondary | ICD-10-CM | POA: Diagnosis not present

## 2024-02-14 DIAGNOSIS — Z79899 Other long term (current) drug therapy: Secondary | ICD-10-CM

## 2024-02-14 LAB — COMPREHENSIVE METABOLIC PANEL WITH GFR
ALT: 14 U/L (ref 0–44)
AST: 22 U/L (ref 15–41)
Albumin: 4.4 g/dL (ref 3.5–5.0)
Alkaline Phosphatase: 53 U/L (ref 38–126)
Anion gap: 12 (ref 5–15)
BUN: 15 mg/dL (ref 8–23)
CO2: 27 mmol/L (ref 22–32)
Calcium: 9.7 mg/dL (ref 8.9–10.3)
Chloride: 99 mmol/L (ref 98–111)
Creatinine, Ser: 1.41 mg/dL — ABNORMAL HIGH (ref 0.61–1.24)
GFR, Estimated: 50 mL/min — ABNORMAL LOW
Glucose, Bld: 139 mg/dL — ABNORMAL HIGH (ref 70–99)
Potassium: 4.8 mmol/L (ref 3.5–5.1)
Sodium: 138 mmol/L (ref 135–145)
Total Bilirubin: 1.9 mg/dL — ABNORMAL HIGH (ref 0.0–1.2)
Total Protein: 7.3 g/dL (ref 6.5–8.1)

## 2024-02-14 LAB — CBC
HCT: 43.7 % (ref 39.0–52.0)
Hemoglobin: 15 g/dL (ref 13.0–17.0)
MCH: 31.7 pg (ref 26.0–34.0)
MCHC: 34.3 g/dL (ref 30.0–36.0)
MCV: 92.4 fL (ref 80.0–100.0)
Platelets: 255 K/uL (ref 150–400)
RBC: 4.73 MIL/uL (ref 4.22–5.81)
RDW: 12.9 % (ref 11.5–15.5)
WBC: 11.5 K/uL — ABNORMAL HIGH (ref 4.0–10.5)
nRBC: 0 % (ref 0.0–0.2)

## 2024-02-14 LAB — RESP PANEL BY RT-PCR (RSV, FLU A&B, COVID)  RVPGX2
Influenza A by PCR: NEGATIVE
Influenza B by PCR: NEGATIVE
Resp Syncytial Virus by PCR: NEGATIVE
SARS Coronavirus 2 by RT PCR: NEGATIVE

## 2024-02-14 LAB — LIPASE, BLOOD: Lipase: 26 U/L (ref 11–51)

## 2024-02-14 LAB — LACTIC ACID, PLASMA: Lactic Acid, Venous: 1.6 mmol/L (ref 0.5–1.9)

## 2024-02-14 MED ORDER — ONDANSETRON HCL 4 MG/2ML IJ SOLN
4.0000 mg | Freq: Once | INTRAMUSCULAR | Status: AC
  Administered 2024-02-14: 4 mg via INTRAVENOUS
  Filled 2024-02-14: qty 2

## 2024-02-14 MED ORDER — SODIUM CHLORIDE 0.9 % IV BOLUS
1000.0000 mL | Freq: Once | INTRAVENOUS | Status: AC
  Administered 2024-02-14: 1000 mL via INTRAVENOUS

## 2024-02-14 MED ORDER — MORPHINE SULFATE (PF) 4 MG/ML IV SOLN
4.0000 mg | Freq: Once | INTRAVENOUS | Status: AC
  Administered 2024-02-14: 4 mg via INTRAVENOUS
  Filled 2024-02-14: qty 1

## 2024-02-14 MED ORDER — IOHEXOL 300 MG/ML  SOLN
100.0000 mL | Freq: Once | INTRAMUSCULAR | Status: AC | PRN
  Administered 2024-02-14: 100 mL via INTRAVENOUS

## 2024-02-14 NOTE — ED Notes (Signed)
 Per pt, has had multiple NG insertions in past. Has small passages and rt side works best with Xray.

## 2024-02-14 NOTE — ED Triage Notes (Signed)
 Pt to ED for NVD and lower bilateral abdominal pain since 2 days ago. Daughter states pt vomited 3 times in the car on way here. States is belching and passing lots of gas. Hx SBO. Appears slightly jaundiced.

## 2024-02-14 NOTE — ED Provider Notes (Signed)
 As suspected small bowel obstruction on CT scan.  NG tube and admission to hospital service.   Cyrena Mylar, MD 02/14/24 [phone]

## 2024-02-14 NOTE — Discharge Instructions (Addendum)

## 2024-02-14 NOTE — ED Provider Notes (Signed)
 "  [facility] Provider Note    Event Date/Time   First MD Initiated Contact with Patient 02/14/24 2107     (approximate)   History   Chief Complaint: Abdominal Pain, Diarrhea, and Emesis   HPI  Lonnie Schaefer is a 84 y.o. male with a history of COPD, SBO, inguinal hernia status postrepair who comes ED complaining of nausea vomiting diarrhea for the past 2 days, unable to eat or drink anything.  No fever or chills, no chest pain or shortness of breath.  No hematemesis or melena        Past Medical History:  Diagnosis Date   Bowel obstruction (HCC) 06/2009-10/01/2013 X 5; 04/03/2016   COPD (chronic obstructive pulmonary disease) (HCC)    Coronary artery disease    H/O hiatal hernia    History of stomach ulcers ~ 1960   Hypoxia    idiopathic sleep related hypoxia   Impaired fasting glucose    On home oxygen  therapy    2L q hs (04/03/2016)   Oral cancer (HCC)    Osteoporosis    Pneumonia 1990s X 1   SBO (small bowel obstruction) (HCC) 04/21/2017   Shortness of breath     Current Outpatient Rx   Order #: [id number] Class: Historical Med   Order #: [id number] Class: Normal   Order #: [id number] Class: Historical Med   Order #: [id number] Class: Historical Med   Order #: [id number] Class: Historical Med   Order #: [id number] Class: Historical Med   Order #: [id number] Class: Historical Med   Order #: [id number] Class: Normal   Order #: [id number] Class: Historical Med   Order #: [id number] Class: Normal   Order #: [id number] Class: Sample    Past Surgical History:  Procedure Laterality Date   ABDOMINAL EXPLORATION SURGERY  06/2009; 10/2009   ABDOMINAL SURGERY     APPENDECTOMY  10/2009   CHOLECYSTECTOMY N/A 09/06/2016   Procedure: LAPAROSCOPIC CHOLECYSTECTOMY WITH  INTRAOPERATIVE CHOLANGIOGRAM;  Surgeon: Rubin Calamity, MD;  Location: [facility] OR;  Service: General;  Laterality: N/A;   COLONOSCOPY     ESOPHAGOGASTRODUODENOSCOPY     Dr Celestia    GUM SURGERY     GUM  SURGERY     upper gum surgery, cancer    HERNIA REPAIR     [facility]   LESION REMOVAL Left 10/04/2019   Procedure: ORAL LESION REMOVAL W/BIOPSY;  Surgeon: Jesus Oliphant, MD;  Location: [facility] OR;  Service: ENT;  Laterality: Left;   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   hard & soft palate   UMBILICAL HERNIA REPAIR  06/2009   w/mesh    Physical Exam   Triage Vital Signs: ED Triage Vitals  Encounter Vitals Group     BP 02/14/24 1827 117/70     Girls Systolic BP Percentile --      Girls Diastolic BP Percentile --      Boys Systolic BP Percentile --      Boys Diastolic BP Percentile --      Pulse Rate 02/14/24 1827 91     Resp 02/14/24 1827 20     Temp 02/14/24 1827 97.6 F (36.4 C)     Temp Source 02/14/24 1827 Oral     SpO2 02/14/24 1827 97 %     Weight 02/14/24 1826 153 lb 14.1 oz (69.8 kg)     Height 02/14/24 1826 5' 10 (1.778 m)     Head Circumference --      Peak Flow --      Pain Score 02/14/24 1826  8     Pain Loc --      Pain Education --      Exclude from Growth Chart --     Most recent vital signs: Vitals:   02/14/24 1827  BP: 117/70  Pulse: 91  Resp: 20  Temp: 97.6 F (36.4 C)  SpO2: 97%    General: Awake, no distress.  CV:  Good peripheral perfusion.  Regular rate rhythm Resp:  Normal effort.  Clear lungs Abd:  No distention.  Soft with generalized tenderness.  There is a tender right inguinal hernia which was reducible at bedside. Other:  Dry oral mucosa   ED Results / Procedures / Treatments   Labs (all labs ordered are listed, but only abnormal results are displayed) Labs Reviewed  COMPREHENSIVE METABOLIC PANEL WITH GFR - Abnormal; Notable for the following components:      Result Value   Glucose, Bld 139 (*)    Creatinine, Ser 1.41 (*)    Total Bilirubin 1.9 (*)    GFR, Estimated 50 (*)    All other components within normal limits  CBC - Abnormal; Notable for the following components:   WBC 11.5 (*)    All other components within normal  limits  RESP PANEL BY RT-PCR (RSV, FLU A&B, COVID)  RVPGX2  LIPASE, BLOOD  URINALYSIS, ROUTINE W REFLEX MICROSCOPIC  LACTIC ACID, PLASMA     EKG    RADIOLOGY CT abdomen pelvis pending   PROCEDURES:  Procedures   MEDICATIONS ORDERED IN ED: Medications  sodium chloride  0.9 % bolus 1,000 mL (1,000 mLs Intravenous New Bag/Given 02/14/24 2207)  ondansetron  (ZOFRAN ) injection 4 mg (4 mg Intravenous Given 02/14/24 2208)  morphine  (PF) 4 MG/ML injection 4 mg (4 mg Intravenous Given 02/14/24 2210)     IMPRESSION / MDM / ASSESSMENT AND PLAN / ED COURSE  I reviewed the triage vital signs and the nursing notes.  DDx: Dehydration, electrolyte derangement, influenza, COVID, pancreatitis, AKI, diverticulitis  Patient's presentation is most consistent with acute presentation with potential threat to life or bodily function.  Patient presents with generalized abdominal pain, vomiting.  Vital signs are normal.  Had an inguinal hernia which was reduced on examination.  Serum labs unremarkable.  Will obtain CT       FINAL CLINICAL IMPRESSION(S) / ED DIAGNOSES   Final diagnoses:  Nausea vomiting and diarrhea  Right inguinal hernia     Rx / DC Orders   ED Discharge Orders     None        Note:  This document was prepared using Dragon voice recognition software and may include unintentional dictation errors.   Viviann Pastor, MD 02/14/24 2221  "

## 2024-02-14 NOTE — Consult Note (Signed)
 Patient ID: Lonnie Schaefer, male   DOB: 1940-08-02, 84 y.o.   MRN: [id number] CC: SBO History of Present Illness Lonnie Schaefer is a 84 y.o. male with past medical history significant for COPD who is on 2 L nasal cannula at night who presents in consultation for abdominal pain and concern for small bowel obstruction.  The patient reports that over the last 2 weeks he has had intermittent nausea and vomiting as well as diarrhea.  He says that it was acutely worse in the last 2 days.  He reports that he has had worsening abdominal pain, distention and vomiting.  He last had a bowel movement today at 1 and has not passed any gas since then.  The pain persisted so much that he came to the emergency department for evaluation.  He had labs that showed a leukocytosis and a CT scan that was concerning for small bowel obstruction.  He has a surgical history significant for laparoscopic cholecystectomy and says he has had a hernia repair and exploratory laparotomy years ago.  He denies any history of coronary artery disease but does say he takes an aspirin  every day.  Of note, the patient has had multiple admissions at [facility] for small bowel obstruction that have all been treated nonoperatively.  It appears that his last admission for this was in 2021  Past Medical History Past Medical History:  Diagnosis Date   Bowel obstruction (HCC) 06/2009-10/01/2013 X 5; 04/03/2016   COPD (chronic obstructive pulmonary disease) (HCC)    Coronary artery disease    H/O hiatal hernia    History of stomach ulcers ~ 1960   Hypoxia    idiopathic sleep related hypoxia   Impaired fasting glucose    On home oxygen  therapy    2L q hs (04/03/2016)   Oral cancer (HCC)    Osteoporosis    Pneumonia 1990s X 1   SBO (small bowel obstruction) (HCC) 04/21/2017   Shortness of breath        Past Surgical History:  Procedure Laterality Date   ABDOMINAL EXPLORATION SURGERY  06/2009; 10/2009   ABDOMINAL SURGERY     APPENDECTOMY  10/2009    CHOLECYSTECTOMY N/A 09/06/2016   Procedure: LAPAROSCOPIC CHOLECYSTECTOMY WITH  INTRAOPERATIVE CHOLANGIOGRAM;  Surgeon: Rubin Calamity, MD;  Location: [facility] OR;  Service: General;  Laterality: N/A;   COLONOSCOPY     ESOPHAGOGASTRODUODENOSCOPY     Dr Celestia    GUM SURGERY     GUM SURGERY     upper gum surgery, cancer    HERNIA REPAIR     [facility]   LESION REMOVAL Left 10/04/2019   Procedure: ORAL LESION REMOVAL W/BIOPSY;  Surgeon: Jesus Oliphant, MD;  Location: [facility] OR;  Service: ENT;  Laterality: Left;   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   hard & soft palate   UMBILICAL HERNIA REPAIR  06/2009   w/mesh    Allergies[1]  No current facility-administered medications for this encounter.   Current Outpatient Medications  Medication Sig Dispense Refill   albuterol  (PROVENTIL  HFA;VENTOLIN  HFA) 108 (90 BASE) MCG/ACT inhaler Inhale 1 puff into the lungs every 6 (six) hours as needed for wheezing.      albuterol  (VENTOLIN  HFA) 108 (90 Base) MCG/ACT inhaler Inhale 2 puffs into the lungs every 6 (six) hours as needed for wheezing or shortness of breath. 8 g 2   alendronate  (FOSAMAX ) 70 MG tablet Take 70 mg by mouth every Wednesday.  0   aspirin  EC 81 MG tablet  Take 81 mg by mouth daily. Swallow whole.     clobetasol  ointment (TEMOVATE ) 0.05 % Apply 1 application topically 2 (two) times daily as needed (rash).      lactose free nutrition (BOOST) LIQD Take 237 mLs by mouth daily.     levothyroxine  (SYNTHROID ) 75 MCG tablet Take 75 mcg by mouth.     mupirocin  cream (BACTROBAN ) 2 % Apply topically 2 (two) times daily. Apply to rash on the back 15 g 0   OXYGEN  Inhale 2 L into the lungs at bedtime.     polyethylene glycol (MIRALAX  / GLYCOLAX ) 17 g packet Take 17 g by mouth 2 (two) times daily. (Patient taking differently: Take 17 g by mouth daily as needed for mild constipation.) 14 each 0   Tiotropium Bromide -Olodaterol (STIOLTO RESPIMAT ) 2.5-2.5 MCG/ACT AERS Inhale 2 puffs into the lungs daily. 2 each  0    Family History Family History  Problem Relation Age of Onset   Pneumonia Mother        Social History Social History[2]  Former smoker   ROS Full ROS of systems performed and is otherwise negative there than what is stated in the HPI  Physical Exam Blood pressure 117/70, pulse 91, temperature 97.7 F (36.5 C), temperature source Oral, resp. rate 20, height 5' 10 (1.778 m), weight 69.8 kg, SpO2 97%.  Alert and oriented x 3, normal work of breathing room air, regular rate and rhythm, abdomen is soft, distended, mild tenderness throughout the abdomen without any rebound tenderness or guarding, negative for peritonitis, midline scar is well-healed.  He does have fat-containing umbilical hernia.  Data Reviewed Labs reviewed and significant for leukocytosis.  CT scan personally reviewed and he has dilated stomach and proximal small bowel.  This does taper down to decompressed distal small bowel.  I do not see an acute transition point no except to me more like there is a focal area where he gradually decompresses.  There is a little bit of free fluid in the abdomen.  I have personally reviewed the patient's imaging and medical records.    Assessment/PLan    Patient with small bowel obstruction on CT scan and 1 day history of worsening nausea and vomiting.  Possible enteritis versus small bowel obstruction secondary to adhesive disease.  Regardless we will treat this same way.  Recommend NG tube decompression.  Recommend fluid resuscitation given his slight increase in his creatinine.  I discussed with his daughter and the patient that if the NG tube decompression does not resolve the obstruction versus enteritis then we would need to proceed with a Gastrografin  challenge.  If that fails then we would need to proceed to the operating room  A total of 55 and a spent reviewing the patient's chart, performing history and physical and discussing treatment options with   Lonnie Schaefer Endow 02/14/2024, 11:55 PM     [1] No Known Allergies [2]  Social History Tobacco Use   Smoking status: Former    Current packs/day: 0.00    Average packs/day: 2.0 packs/day for 50.0 years (100.0 ttl pk-yrs)    Types: Cigarettes    Start date: 10/15/1949    Quit date: 10/16/1999    Years since quitting: 24.3   Smokeless tobacco: Never  Vaping Use   Vaping status: Never Used  Substance Use Topics   Alcohol use: Not Currently    Comment: 04/03/2016 quit drinking 01/1987 occ use    Drug use: No

## 2024-02-15 ENCOUNTER — Inpatient Hospital Stay

## 2024-02-15 DIAGNOSIS — I2510 Atherosclerotic heart disease of native coronary artery without angina pectoris: Secondary | ICD-10-CM | POA: Insufficient documentation

## 2024-02-15 DIAGNOSIS — R112 Nausea with vomiting, unspecified: Secondary | ICD-10-CM | POA: Diagnosis not present

## 2024-02-15 DIAGNOSIS — K56609 Unspecified intestinal obstruction, unspecified as to partial versus complete obstruction: Secondary | ICD-10-CM

## 2024-02-15 DIAGNOSIS — R197 Diarrhea, unspecified: Secondary | ICD-10-CM | POA: Diagnosis not present

## 2024-02-15 DIAGNOSIS — E039 Hypothyroidism, unspecified: Secondary | ICD-10-CM | POA: Insufficient documentation

## 2024-02-15 DIAGNOSIS — J449 Chronic obstructive pulmonary disease, unspecified: Secondary | ICD-10-CM | POA: Insufficient documentation

## 2024-02-15 LAB — CBC
HCT: 39.8 % (ref 39.0–52.0)
Hemoglobin: 13.6 g/dL (ref 13.0–17.0)
MCH: 31.5 pg (ref 26.0–34.0)
MCHC: 34.2 g/dL (ref 30.0–36.0)
MCV: 92.1 fL (ref 80.0–100.0)
Platelets: 222 K/uL (ref 150–400)
RBC: 4.32 MIL/uL (ref 4.22–5.81)
RDW: 13.1 % (ref 11.5–15.5)
WBC: 9.3 K/uL (ref 4.0–10.5)
nRBC: 0 % (ref 0.0–0.2)

## 2024-02-15 LAB — URINALYSIS, ROUTINE W REFLEX MICROSCOPIC
Bilirubin Urine: NEGATIVE
Glucose, UA: NEGATIVE mg/dL
Hgb urine dipstick: NEGATIVE
Ketones, ur: 20 mg/dL — AB
Leukocytes,Ua: NEGATIVE
Nitrite: NEGATIVE
Protein, ur: NEGATIVE mg/dL
Specific Gravity, Urine: >1.046 — ABNORMAL HIGH (ref 1.005–1.030)
pH: 5 (ref 5.0–8.0)

## 2024-02-15 LAB — BASIC METABOLIC PANEL WITH GFR
Anion gap: 10 (ref 5–15)
BUN: 20 mg/dL (ref 8–23)
CO2: 25 mmol/L (ref 22–32)
Calcium: 8.8 mg/dL — ABNORMAL LOW (ref 8.9–10.3)
Chloride: 102 mmol/L (ref 98–111)
Creatinine, Ser: 1.29 mg/dL — ABNORMAL HIGH (ref 0.61–1.24)
GFR, Estimated: 56 mL/min — ABNORMAL LOW
Glucose, Bld: 113 mg/dL — ABNORMAL HIGH (ref 70–99)
Potassium: 4.7 mmol/L (ref 3.5–5.1)
Sodium: 137 mmol/L (ref 135–145)

## 2024-02-15 MED ORDER — POTASSIUM CHLORIDE IN NACL 20-0.9 MEQ/L-% IV SOLN
INTRAVENOUS | Status: AC
  Filled 2024-02-15 (×3): qty 1000

## 2024-02-15 MED ORDER — ONDANSETRON HCL 4 MG/2ML IJ SOLN
4.0000 mg | Freq: Four times a day (QID) | INTRAMUSCULAR | Status: DC | PRN
  Administered 2024-02-15 – 2024-02-18 (×6): 4 mg via INTRAVENOUS
  Filled 2024-02-15 (×6): qty 2

## 2024-02-15 MED ORDER — MORPHINE SULFATE (PF) 2 MG/ML IV SOLN
2.0000 mg | INTRAVENOUS | Status: DC | PRN
  Administered 2024-02-15 – 2024-02-17 (×7): 2 mg via INTRAVENOUS
  Filled 2024-02-15 (×8): qty 1

## 2024-02-15 MED ORDER — LEVOTHYROXINE SODIUM 50 MCG PO TABS
75.0000 ug | ORAL_TABLET | Freq: Every day | ORAL | Status: DC
  Administered 2024-02-15 – 2024-02-18 (×4): 75 ug
  Filled 2024-02-15 (×4): qty 1

## 2024-02-15 MED ORDER — ENOXAPARIN SODIUM 40 MG/0.4ML IJ SOSY: 40.0000 mg | PREFILLED_SYRINGE | INTRAMUSCULAR | Status: DC

## 2024-02-15 MED ORDER — ONDANSETRON HCL 4 MG PO TABS: 4.0000 mg | ORAL_TABLET | Freq: Four times a day (QID) | ORAL | Status: DC | PRN

## 2024-02-15 MED ORDER — ARFORMOTEROL TARTRATE 15 MCG/2ML IN NEBU
15.0000 ug | INHALATION_SOLUTION | Freq: Two times a day (BID) | RESPIRATORY_TRACT | Status: DC
  Administered 2024-02-16 – 2024-02-27 (×20): 15 ug via RESPIRATORY_TRACT
  Filled 2024-02-15 (×27): qty 2

## 2024-02-15 MED ORDER — CLOBETASOL PROPIONATE 0.05 % EX OINT: 1.0000 | TOPICAL_OINTMENT | Freq: Two times a day (BID) | CUTANEOUS | Status: DC | PRN

## 2024-02-15 MED ORDER — ALBUTEROL SULFATE (2.5 MG/3ML) 0.083% IN NEBU
2.5000 mg | INHALATION_SOLUTION | Freq: Four times a day (QID) | RESPIRATORY_TRACT | Status: DC | PRN
  Administered 2024-02-25 – 2024-02-26 (×2): 2.5 mg via RESPIRATORY_TRACT
  Filled 2024-02-15 (×2): qty 3

## 2024-02-15 MED ORDER — ALBUTEROL SULFATE HFA 108 (90 BASE) MCG/ACT IN AERS: 2.0000 | INHALATION_SPRAY | Freq: Four times a day (QID) | RESPIRATORY_TRACT | Status: DC | PRN

## 2024-02-15 MED ORDER — TRAZODONE HCL 50 MG PO TABS: 25.0000 mg | ORAL_TABLET | Freq: Every evening | ORAL | Status: DC | PRN

## 2024-02-15 MED ORDER — RANOLAZINE ER 500 MG PO TB12
500.0000 mg | ORAL_TABLET | Freq: Two times a day (BID) | ORAL | Status: DC
  Administered 2024-02-17 – 2024-02-27 (×18): 500 mg via ORAL
  Filled 2024-02-15 (×28): qty 1

## 2024-02-15 MED ORDER — ACETAMINOPHEN 650 MG RE SUPP: 650.0000 mg | Freq: Four times a day (QID) | RECTAL | Status: DC | PRN

## 2024-02-15 MED ORDER — HEPARIN SODIUM (PORCINE) 5000 UNIT/ML IJ SOLN
5000.0000 [IU] | Freq: Two times a day (BID) | INTRAMUSCULAR | Status: DC
  Administered 2024-02-15 – 2024-02-18 (×7): 5000 [IU] via SUBCUTANEOUS
  Filled 2024-02-15 (×7): qty 1

## 2024-02-15 MED ORDER — UMECLIDINIUM BROMIDE 62.5 MCG/ACT IN AEPB
1.0000 | INHALATION_SPRAY | Freq: Every day | RESPIRATORY_TRACT | Status: DC
  Administered 2024-02-15 – 2024-02-27 (×13): 1 via RESPIRATORY_TRACT
  Filled 2024-02-15 (×3): qty 7

## 2024-02-15 MED ORDER — ACETAMINOPHEN 325 MG PO TABS: 650.0000 mg | ORAL_TABLET | Freq: Four times a day (QID) | ORAL | Status: DC | PRN

## 2024-02-15 NOTE — H&P (Addendum)
 "     Lonnie Schaefer   PATIENT NAME: Lonnie Schaefer    MR#:  [id number]  DATE OF BIRTH:  11-20-1940  DATE OF ADMISSION:  02/14/2024  PRIMARY CARE PHYSICIAN: Samie Frederick, PA-C   Patient is coming from: Home  REQUESTING/REFERRING PHYSICIAN: Cyrena Mylar, MD  CHIEF COMPLAINT:   Chief Complaint  Patient presents with   Abdominal Pain   Diarrhea   Emesis    HISTORY OF PRESENT ILLNESS:  Lonnie Schaefer is a 84 y.o. Caucasian male with medical history significant for COPD, coronary artery disease, inguinal hernia s/p repair and SBO, who presented to the emergency room with acute onset of recurrent nausea and vomiting with associated diarrhea and abdominal pain over the last couple of days.  No bilious vomitus or hematemesis.  No melena or bright red bleeding per rectum.  He had subsequent significant diminished p.o. intake.  He denied any fever or chills.  No chest pain or palpitations.  No cough or wheezing or dyspnea.  No dysuria, oliguria or hematuria or flank pain.  No bleeding diathesis.  ED Course: When he came to the ER, vital signs were within normal.  Labs revealed a creatinine of 1.41 above previous normal level total bili 1.9 and blood glucose 139 with unremarkable CMP otherwise.  CBC showed leukocytosis of 11.5 and lactic acid was 1.6. EKG as reviewed by me : None Imaging: Abdominal pelvic CT scan revealed following: 1. Small-bowel obstruction, with transition point in the distal jejunum right lower quadrant. 2. Colonic diverticulosis without diverticulitis. 3. Small fat containing supraumbilical midline ventral hernia and bilateral inguinal hernias. No bowel herniation. 4.  Aortic Atherosclerosis (ICD10-I70.0).  The patient had an inguinal hernia that was reduced by Dr. Viviann.  After NG tube insertion portable abdomen revealed enteric tube terminating in the mid gastric body.  Dr. Marinda was notified about the patient and evaluated him.  The patient will be admitted to a  telemetry bed for further evaluation and management. PAST MEDICAL HISTORY:   Past Medical History:  Diagnosis Date   Bowel obstruction (HCC) 06/2009-10/01/2013 X 5; 04/03/2016   COPD (chronic obstructive pulmonary disease) (HCC)    Coronary artery disease    H/O hiatal hernia    History of stomach ulcers ~ 1960   Hypoxia    idiopathic sleep related hypoxia   Impaired fasting glucose    On home oxygen  therapy    2L q hs (04/03/2016)   Oral cancer (HCC)    Osteoporosis    Pneumonia 1990s X 1   SBO (small bowel obstruction) (HCC) 04/21/2017   Shortness of breath     PAST SURGICAL HISTORY:   Past Surgical History:  Procedure Laterality Date   ABDOMINAL EXPLORATION SURGERY  06/2009; 10/2009   ABDOMINAL SURGERY     APPENDECTOMY  10/2009   CHOLECYSTECTOMY N/A 09/06/2016   Procedure: LAPAROSCOPIC CHOLECYSTECTOMY WITH  INTRAOPERATIVE CHOLANGIOGRAM;  Surgeon: Rubin Calamity, MD;  Location: [facility] OR;  Service: General;  Laterality: N/A;   COLONOSCOPY     ESOPHAGOGASTRODUODENOSCOPY     Dr Celestia    GUM SURGERY     GUM SURGERY     upper gum surgery, cancer    HERNIA REPAIR     [employer]   LESION REMOVAL Left 10/04/2019   Procedure: ORAL LESION REMOVAL W/BIOPSY;  Surgeon: Jesus Oliphant, MD;  Location: [facility] OR;  Service: ENT;  Laterality: Left;   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   hard & soft palate   UMBILICAL  HERNIA REPAIR  06/2009   w/mesh    SOCIAL HISTORY:   Social History   Tobacco Use   Smoking status: Former    Current packs/day: 0.00    Average packs/day: 2.0 packs/day for 50.0 years (100.0 ttl pk-yrs)    Types: Cigarettes    Start date: 10/15/1949    Quit date: 10/16/1999    Years since quitting: 24.3   Smokeless tobacco: Never  Substance Use Topics   Alcohol use: Not Currently    Comment: 04/03/2016 quit drinking 01/1987 occ use     FAMILY HISTORY:   Family History  Problem Relation Age of Onset   Pneumonia Mother     DRUG ALLERGIES:   Allergies[1]  REVIEW OF SYSTEMS:   ROS As per history of present illness. All pertinent systems were reviewed above. Constitutional, HEENT, cardiovascular, respiratory, GI, GU, musculoskeletal, neuro, psychiatric, endocrine, integumentary and hematologic systems were reviewed and are otherwise negative/unremarkable except for positive findings mentioned above in the HPI.   MEDICATIONS AT HOME:   Prior to Admission medications  Medication Sig Start Date End Date Taking? Authorizing Provider  albuterol  (PROVENTIL  HFA;VENTOLIN  HFA) 108 (90 BASE) MCG/ACT inhaler Inhale 1 puff into the lungs every 6 (six) hours as needed for wheezing.    Yes [provider]  albuterol  (VENTOLIN  HFA) 108 (90 Base) MCG/ACT inhaler Inhale 2 puffs into the lungs every 6 (six) hours as needed for wheezing or shortness of breath. 01/25/24  Yes Floy Roberts, MD  alendronate  (FOSAMAX ) 70 MG tablet Take 70 mg by mouth every Wednesday. 03/25/16  Yes [provider]  aspirin  EC 81 MG tablet Take 81 mg by mouth daily. Swallow whole.   Yes [provider]  clobetasol  ointment (TEMOVATE ) 0.05 % Apply 1 application topically 2 (two) times daily as needed (rash).    Yes [provider]  levothyroxine  (SYNTHROID ) 75 MCG tablet Take 75 mcg by mouth. 05/29/22  Yes [provider]  polyethylene glycol (MIRALAX  / GLYCOLAX ) 17 g packet Take 17 g by mouth 2 (two) times daily. Patient taking differently: Take 17 g by mouth daily as needed for mild constipation. 08/09/19  Yes Perri DELENA Meliton Mickey., MD  ranolazine  (RANEXA ) 500 MG 12 hr tablet Take 500 mg by mouth 2 (two) times daily. 01/14/24 01/13/25 Yes [provider]  Tiotropium Bromide -Olodaterol (STIOLTO RESPIMAT ) 2.5-2.5 MCG/ACT AERS Inhale 2 puffs into the lungs daily. 01/03/20  Yes Tamea Dedra CROME, MD  lactose free nutrition (BOOST) LIQD Take 237 mLs by mouth daily.    [provider]  mupirocin  cream (BACTROBAN )  2 % Apply topically 2 (two) times daily. Apply to rash on the back Patient not taking: Reported on 02/15/2024 04/11/16   Verdene Purchase, MD  OXYGEN  Inhale 2 L into the lungs at bedtime.    [provider]      VITAL SIGNS:  Blood pressure (!) 148/74, pulse 73, temperature 97.7 F (36.5 C), temperature source Oral, resp. rate 20, height 5' 10 (1.778 m), weight 69.8 kg, SpO2 92%.  PHYSICAL EXAMINATION:  Physical Exam  GENERAL:  84 y.o.-year-old Caucasian male patient lying in the bed with no acute distress.  EYES: Pupils equal, round, reactive to light and accommodation. No scleral icterus. Extraocular muscles intact.  HEENT: Head atraumatic, normocephalic. Oropharynx and nasopharynx with intact NG tube and 600 cc of dark green aspirate. NECK:  Supple, no jugular venous distention. No thyroid  enlargement, no tenderness.  LUNGS: Normal breath sounds bilaterally, no wheezing, rales,rhonchi or crepitation.  No use of accessory muscles of respiration.  CARDIOVASCULAR: Regular rate and rhythm, S1, S2 normal. No murmurs, rubs, or gallops.  ABDOMEN: Soft with mild generalized tenderness without rebound tenderness guarding or rigidity.  He had diminished bowel sounds.  He had a fat-containing umbilical hernia and midline scar that is healed. EXTREMITIES: No pedal edema, cyanosis, or clubbing.  NEUROLOGIC: Cranial nerves II through XII are intact. Muscle strength 5/5 in all extremities. Sensation intact. Gait not checked.  PSYCHIATRIC: The patient is alert and oriented x 3.  Normal affect and good eye contact. SKIN: No obvious rash, lesion, or ulcer.   LABORATORY PANEL:   CBC Recent Labs  Lab 02/14/24 1828  WBC 11.5*  HGB 15.0  HCT 43.7  PLT 255   ------------------------------------------------------------------------------------------------------------------  Chemistries  Recent Labs  Lab 02/14/24 1828  NA 138  K 4.8  CL 99  CO2 27  GLUCOSE 139*  BUN 15  CREATININE 1.41*   CALCIUM  9.7  AST 22  ALT 14  ALKPHOS 53  BILITOT 1.9*   ------------------------------------------------------------------------------------------------------------------  Cardiac Enzymes No results for input(s): TROPONINI in the last 168 hours. ------------------------------------------------------------------------------------------------------------------  RADIOLOGY:  DG Abd Portable 1 View Result Date: 02/15/2024 EXAM: 1 VIEW XRAY OF THE ABDOMEN 02/15/2024 01:31:00 AM COMPARISON: None available. CLINICAL HISTORY: NG Placement. FINDINGS: LINES, TUBES AND DEVICES: Enteric tube terminates in the mid gastric body. BOWEL: Nonobstructive bowel gas pattern. SOFT TISSUES: No abnormal calcifications. BONES: No acute fracture. LUNGS: Mild basilar atelectasis. IMPRESSION: 1. Enteric tube terminates in the mid gastric body. Electronically signed by: Pinkie Pebbles MD MD 02/15/2024 01:32 AM EST RP Workstation: [id number]   CT ABDOMEN PELVIS W CONTRAST Result Date: 02/14/2024 CLINICAL DATA:  Abdominal pain, nausea and vomiting, diarrhea for 2 days EXAM: CT ABDOMEN AND PELVIS WITH CONTRAST TECHNIQUE: Multidetector CT imaging of the abdomen and pelvis was performed using the standard protocol following bolus administration of intravenous contrast. RADIATION DOSE REDUCTION: This exam was performed according to the departmental dose-optimization program which includes automated exposure control, adjustment of the mA and/or kV according to patient size and/or use of iterative reconstruction technique. CONTRAST:  OMNIPAQUE  IOHEXOL  300 MG/ML  SOLN COMPARISON:  08/05/2019 FINDINGS: Lower chest: No acute pleural or parenchymal lung disease. Hepatobiliary: No focal liver abnormality is seen. Status post cholecystectomy. No biliary dilatation. Pancreas: Unremarkable. No pancreatic ductal dilatation or surrounding inflammatory changes. Spleen: Normal in size without focal abnormality. Adrenals/Urinary Tract:  Adrenals are unremarkable. Simple cyst lower pole left kidney does not require specific follow-up. Otherwise the kidneys enhance normally. No urinary tract calculi or obstructive uropathy. Bladder is unremarkable. Stomach/Bowel: Dilation of the small bowel to the level of the distal jejunum, measuring up to 4.2 cm in maximal diameter with multiple scattered gas fluid levels, consistent with small-bowel obstruction. Transition from dilated to nondilated small bowel is seen within the right lower quadrant image 71/2. The colon is decompressed. Diffuse colonic diverticulosis without diverticulitis. No bowel wall thickening or inflammatory change. Prior appendectomy. Vascular/Lymphatic: Aortic atherosclerosis. No enlarged abdominal or pelvic lymph nodes. Reproductive: Prostate is unremarkable. Other: Trace pelvic free fluid. No free intraperitoneal gas. Small fat containing supraumbilical midline ventral hernia. Bilateral fat containing inguinal hernias. Musculoskeletal: No acute or destructive bony abnormalities. Reconstructed images demonstrate no additional findings. IMPRESSION: 1. Small-bowel obstruction, with transition point in the distal jejunum right lower quadrant. 2. Colonic diverticulosis without diverticulitis. 3. Small fat containing supraumbilical midline ventral hernia and bilateral inguinal hernias. No bowel herniation. 4.  Aortic Atherosclerosis (ICD10-I70.0). Electronically Signed  By: Ozell Daring M.D.   On: 02/14/2024 23:12      IMPRESSION AND PLAN:  Assessment and Plan: * Small bowel obstruction (HCC) - The patient will be admitted to a telemetry bed. - He will be kept NPO. - The patient had an NG tube inserted for decompression. - Will continue hydration with IV normal saline. - General Surgery consult was obtained and the patient was seen by Dr. Marinda will follow with us . - Will obtain two-view abdomen x-ray in a.m. for follow-up.  Hypothyroidism - Will continue Synthroid  and  check TSH  Coronary artery disease - Will continue Ranexa  and hold off aspirin .  Chronic obstructive pulmonary disease (COPD) (HCC) - Will continue his inhalers.   DVT prophylaxis: SCDs.  Medical prophylaxis is postponed for potential need for surgical intervention. Advanced Care Planning:  Code Status: full code.  Family Communication:  The plan of care was discussed in details with the patient (and family). I answered all questions. The patient agreed to proceed with the above mentioned plan. Further management will depend upon hospital course. Disposition Plan: Back to previous home environment Consults called: General Surgery All the records are reviewed and case discussed with ED provider.  Status is: Inpatient  At the time of the admission, it appears that the appropriate admission status for this patient is inpatient.  This is judged to be reasonable and necessary in order to provide the required intensity of service to ensure the patient's safety given the presenting symptoms, physical exam findings and initial radiographic and laboratory data in the context of comorbid conditions.  The patient requires inpatient status due to high intensity of service, high risk of further deterioration and high frequency of surveillance required.  I certify that at the time of admission, it is my clinical judgment that the patient will require inpatient hospital care extending more than 2 midnights.                            Dispo: The patient is from: Home              Anticipated d/c is to: Home              Patient currently is not medically stable to d/c.              Difficult to place patient: No  [place]ison DELENA Peaches M.D on 02/15/2024 at 4:02 AM  [facility]   From 7 PM-7 AM, contact night-coverage [email or web address]  CC: Primary care physician; Samie Frederick, PA-C     [1] No Known Allergies  "

## 2024-02-15 NOTE — Assessment & Plan Note (Signed)
-   Will continue his inhalers.

## 2024-02-15 NOTE — Progress Notes (Signed)
 " PROGRESS NOTE    Lonnie Schaefer  FMW:[id number] DOB: 04-26-1940 DOA: 02/14/2024 PCP: Samie Frederick, PA-C  Chief Complaint  Patient presents with   Abdominal Pain   Diarrhea   Emesis    Hospital Course:  Lonnie Schaefer is an 84 year old male with COPD, CAD, inguinal hernia s/p repair and prior SBO, who presented to the emergency department with acute onset recurrent nausea and vomiting as well as diarrhea and abdominal pain.  Labs consistent with mild AKI, leukocytosis of 11.5.  CT abdomen pelvis with SBO, transition point in the distal jejunum.  Also revealed small fat-containing supraumbilical midline ventral hernia and bilateral inguinal hernias without bowel herniation.  Colonic diverticulosis without diverticulitis. Inguinal hernia was reduced by Dr. Viviann.  NG tube was inserted.  General surgery was consulted.  Subjective: Patient reports he is feeling much better.  Reports he did have 1 small flatus just prior to arrival.   Objective: Vitals:   02/15/24 0423 02/15/24 0618 02/15/24 0642 02/15/24 1029  BP:  (!) 151/73 (!) 154/69 127/68  Pulse:  73 84 72  Resp:  20 18 18   Temp: 98.2 F (36.8 C)  97.8 F (36.6 C) 97.9 F (36.6 C)  TempSrc: Oral     SpO2:  95% 92% 92%  Weight:   67.6 kg   Height:   5' 10 (1.778 m)    No intake or output data in the 24 hours ending 02/15/24 1228 Filed Weights   02/14/24 1826 02/15/24 0642  Weight: 69.8 kg 67.6 kg    Examination: General exam: Appears calm and comfortable, NAD  Respiratory system: No work of breathing, symmetric chest wall expansion Cardiovascular system: S1 & S2 heard, RRR.  Gastrointestinal system: Abdomen is nondistended, soft and nontender.  No bowel sounds x 4 Neuro: Alert and oriented.  Psychiatry: Demonstrates appropriate judgement and insight. Mood & affect appropriate for situation.   Assessment & Plan:  Principal Problem:   Small bowel obstruction (HCC) Active Problems:   SBO (small bowel obstruction)  (HCC)   Chronic obstructive pulmonary disease (COPD) (HCC)   Coronary artery disease   Hypothyroidism    Bowel obstruction - Currently on NG to LIS.  Continue with decompression - Okay to remove suction briefly for 15 to 20 minutes so patient may ambulate around the unit - Continue with fluid resuscitation - General Surgery consulted and following. - If patient does not have bowel function returned today will likely receive Gastrografin  challenge tomorrow. - Of note he has had multiple prior small bowel obstructions, all of which have been treated nonoperatively.  COPD, not currently in exacerbation Nocturnal hypoxia - Resume home inhalers - O2 at night  CAD - Stable.  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  Post radiation therapy - Following outpatient with ENT and oncology  Acquired hypothyroidism - Continue Synthroid   Osteoporosis - Continue outpatient follow-up  DVT prophylaxis: Heparin    Code Status: Do not attempt resuscitation (DNR) PRE-ARREST INTERVENTIONS DESIRED Disposition:  inpatient pending clinical resolution  Consultants:  Treatment Team:  Consulting Physician: Marinda Jayson KIDD, MD  Procedures:    Antimicrobials:  Anti-infectives (From admission, onward)    None       Data Reviewed: I have personally reviewed following labs and imaging studies CBC: Recent Labs  Lab 02/14/24 1828 02/15/24 0611  WBC 11.5* 9.3  HGB 15.0 13.6  HCT 43.7 39.8  MCV 92.4 92.1  PLT  255 222   Basic Metabolic Panel: Recent Labs  Lab 02/14/24 1828 02/15/24 0611  NA 138 137  K 4.8 4.7  CL 99 102  CO2 27 25  GLUCOSE 139* 113*  BUN 15 20  CREATININE 1.41* 1.29*  CALCIUM  9.7 8.8*   GFR: Estimated Creatinine Clearance: 42.9 mL/min (A) (by C-G formula based on SCr of 1.29 mg/dL (H)). Liver Function Tests: Recent Labs  Lab 02/14/24 1828  AST 22  ALT 14  ALKPHOS 53  BILITOT  1.9*  PROT 7.3  ALBUMIN 4.4   CBG: No results for input(s): GLUCAP in the last 168 hours.  Recent Results (from the past 240 hours)  Resp panel by RT-PCR (RSV, Flu A&B, Covid) Anterior Nasal Swab     Status: None   Collection Time: 02/14/24  9:53 PM   Specimen: Anterior Nasal Swab  Result Value Ref Range Status   SARS Coronavirus 2 by RT PCR NEGATIVE NEGATIVE Final    Comment: (NOTE) SARS-CoV-2 target nucleic acids are NOT DETECTED.  The SARS-CoV-2 RNA is generally detectable in upper respiratory specimens during the acute phase of infection. The lowest concentration of SARS-CoV-2 viral copies this assay can detect is 138 copies/mL. A negative result does not preclude SARS-Cov-2 infection and should not be used as the sole basis for treatment or other patient management decisions. A negative result may occur with  improper specimen collection/handling, submission of specimen other than nasopharyngeal swab, presence of viral mutation(s) within the areas targeted by this assay, and inadequate number of viral copies(<138 copies/mL). A negative result must be combined with clinical observations, patient history, and epidemiological information. The expected result is Negative.  Fact Sheet for Patients:  bloggercourse.com  Fact Sheet for Healthcare Providers:  seriousbroker.it  This test is no t yet approved or cleared by the [place]  FDA and  has been authorized for detection and/or diagnosis of SARS-CoV-2 by FDA under an Emergency Use Authorization (EUA). This EUA will remain  in effect (meaning this test can be used) for the duration of the COVID-19 declaration under Section 564(b)(1) of the Act, 21 U.S.C.section 360bbb-3(b)(1), unless the authorization is terminated  or revoked sooner.       Influenza A by PCR NEGATIVE NEGATIVE Final   Influenza B by PCR NEGATIVE NEGATIVE Final    Comment: (NOTE) The Xpert Xpress  SARS-CoV-2/FLU/RSV plus assay is intended as an aid in the diagnosis of influenza from Nasopharyngeal swab specimens and should not be used as a sole basis for treatment. Nasal washings and aspirates are unacceptable for Xpert Xpress SARS-CoV-2/FLU/RSV testing.  Fact Sheet for Patients: bloggercourse.com  Fact Sheet for Healthcare Providers: seriousbroker.it  This test is not yet approved or cleared by the [place]  FDA and has been authorized for detection and/or diagnosis of SARS-CoV-2 by FDA under an Emergency Use Authorization (EUA). This EUA will remain in effect (meaning this test can be used) for the duration of the COVID-19 declaration under Section 564(b)(1) of the Act, 21 U.S.C. section 360bbb-3(b)(1), unless the authorization is terminated or revoked.     Resp Syncytial Virus by PCR NEGATIVE NEGATIVE Final    Comment: (NOTE) Fact Sheet for Patients: bloggercourse.com  Fact Sheet for Healthcare Providers: seriousbroker.it  This test is not yet approved or cleared by the [place]  FDA and has been authorized for detection and/or diagnosis of SARS-CoV-2 by FDA under an Emergency Use Authorization (EUA). This EUA will remain in effect (meaning this test can be used) for the duration of  the COVID-19 declaration under Section 564(b)(1) of the Act, 21 U.S.C. section 360bbb-3(b)(1), unless the authorization is terminated or revoked.  Performed at [facility], [address] [place], [place] [address]      Radiology Studies: DG Abd 2 Views Result Date: 02/15/2024 CLINICAL DATA:  Small bowel obstruction. EXAM: ABDOMEN - 2 VIEW COMPARISON:  Earlier same day FINDINGS: NG tube tip is in the distal stomach in the region the pylorus. Transpyloric extension into the proximal duodenum is not excluded. NG tube could be withdrawn 3-4 cm to ensure placement in the  distal stomach. There is diffuse gaseous dilatation of small bowel up to 4.7 cm diameter. No substantial colonic gas although there may be some minimal gas in the right colon. Contrast material in the bladder is consistent with recent CT imaging. IMPRESSION: 1. NG tube tip is in the distal stomach in the region of the pylorus. Transpyloric extension into the proximal duodenum is not excluded. NG tube could be withdrawn 3-4 cm to ensure placement in the distal stomach. 2. Diffuse gaseous dilatation of small bowel with minimal gas in the right colon. Imaging features remain compatible with small bowel obstruction with no substantial change when comparing to scout film from CT scan yesterday. Electronically Signed   By: Camellia Candle M.D.   On: 02/15/2024 07:47   DG Abd Portable 1 View Result Date: 02/15/2024 EXAM: 1 VIEW XRAY OF THE ABDOMEN 02/15/2024 01:31:00 AM COMPARISON: None available. CLINICAL HISTORY: NG Placement. FINDINGS: LINES, TUBES AND DEVICES: Enteric tube terminates in the mid gastric body. BOWEL: Nonobstructive bowel gas pattern. SOFT TISSUES: No abnormal calcifications. BONES: No acute fracture. LUNGS: Mild basilar atelectasis. IMPRESSION: 1. Enteric tube terminates in the mid gastric body. Electronically signed by: Pinkie Pebbles MD MD 02/15/2024 01:32 AM EST RP Workstation: [id number]   CT ABDOMEN PELVIS W CONTRAST Result Date: 02/14/2024 CLINICAL DATA:  Abdominal pain, nausea and vomiting, diarrhea for 2 days EXAM: CT ABDOMEN AND PELVIS WITH CONTRAST TECHNIQUE: Multidetector CT imaging of the abdomen and pelvis was performed using the standard protocol following bolus administration of intravenous contrast. RADIATION DOSE REDUCTION: This exam was performed according to the departmental dose-optimization program which includes automated exposure control, adjustment of the mA and/or kV according to patient size and/or use of iterative reconstruction technique. CONTRAST:  OMNIPAQUE   IOHEXOL  300 MG/ML  SOLN COMPARISON:  08/05/2019 FINDINGS: Lower chest: No acute pleural or parenchymal lung disease. Hepatobiliary: No focal liver abnormality is seen. Status post cholecystectomy. No biliary dilatation. Pancreas: Unremarkable. No pancreatic ductal dilatation or surrounding inflammatory changes. Spleen: Normal in size without focal abnormality. Adrenals/Urinary Tract: Adrenals are unremarkable. Simple cyst lower pole left kidney does not require specific follow-up. Otherwise the kidneys enhance normally. No urinary tract calculi or obstructive uropathy. Bladder is unremarkable. Stomach/Bowel: Dilation of the small bowel to the level of the distal jejunum, measuring up to 4.2 cm in maximal diameter with multiple scattered gas fluid levels, consistent with small-bowel obstruction. Transition from dilated to nondilated small bowel is seen within the right lower quadrant image 71/2. The colon is decompressed. Diffuse colonic diverticulosis without diverticulitis. No bowel wall thickening or inflammatory change. Prior appendectomy. Vascular/Lymphatic: Aortic atherosclerosis. No enlarged abdominal or pelvic lymph nodes. Reproductive: Prostate is unremarkable. Other: Trace pelvic free fluid. No free intraperitoneal gas. Small fat containing supraumbilical midline ventral hernia. Bilateral fat containing inguinal hernias. Musculoskeletal: No acute or destructive bony abnormalities. Reconstructed images demonstrate no additional findings. IMPRESSION: 1. Small-bowel obstruction, with transition point in the  distal jejunum right lower quadrant. 2. Colonic diverticulosis without diverticulitis. 3. Small fat containing supraumbilical midline ventral hernia and bilateral inguinal hernias. No bowel herniation. 4.  Aortic Atherosclerosis (ICD10-I70.0). Electronically Signed   By: Ozell Daring M.D.   On: 02/14/2024 23:12    Scheduled Meds:  arformoterol   15 mcg Nebulization BID   And   umeclidinium bromide   1  puff Inhalation Daily   levothyroxine   75 mcg Per Tube Q0600   ranolazine   500 mg Oral BID   Continuous Infusions:  0.9 % NaCl with KCl 20 mEq / L 100 mL/hr at 02/15/24 0908     LOS: 1 day  MDM: Patient is high risk for one or more organ failure.  They necessitate ongoing hospitalization for continued IV therapies and subsequent lab monitoring. Total time spent interpreting labs and vitals, reviewing the medical record, coordinating care amongst consultants and care team members, directly assessing and discussing care with the patient and/or family: 55 min  Axten Pascucci, DO [facility]  To contact the attending physician between 7A-7P please use Epic Chat. To contact the covering physician during after hours 7P-7A, please review Amion.  02/15/2024, 12:28 PM   *This document has been created with the assistance of dictation software. Please excuse typographical errors. *   "

## 2024-02-15 NOTE — ED Notes (Signed)
 Gastric content green with sediment

## 2024-02-15 NOTE — Plan of Care (Signed)

## 2024-02-15 NOTE — Assessment & Plan Note (Signed)
-   Will continue Synthroid and check TSH.

## 2024-02-15 NOTE — Progress Notes (Signed)
 CC: SBO Subjective: Patient admitted overnight with SBO. NGT placed and he had 550ccs out. HE reports feeling much better. Denies flatus.   Objective: Vital signs in last 24 hours: Temp:  [97.6 F (36.4 C)-98.2 F (36.8 C)] 97.9 F (36.6 C) (01/11 1029) Pulse Rate:  [72-91] 72 (01/11 1029) Resp:  [18-20] 18 (01/11 1029) BP: (117-154)/(68-74) 127/68 (01/11 1029) SpO2:  [92 %-97 %] 92 % (01/11 1029) Weight:  [67.6 kg-69.8 kg] 67.6 kg (01/11 0642) Last BM Date : 02/14/24  Intake/Output from previous day: No intake/output data recorded. Intake/Output this shift: No intake/output data recorded.  Physical exam:  Abdomen is still distended, but improved. Soft, mild tenderness in RLQ. Midline scar is well healed.   Lab Results: CBC  Recent Labs    02/14/24 1828 02/15/24 0611  WBC 11.5* 9.3  HGB 15.0 13.6  HCT 43.7 39.8  PLT 255 222   BMET Recent Labs    02/14/24 1828 02/15/24 0611  NA 138 137  K 4.8 4.7  CL 99 102  CO2 27 25  GLUCOSE 139* 113*  BUN 15 20  CREATININE 1.41* 1.29*  CALCIUM  9.7 8.8*   PT/INR No results for input(s): LABPROT, INR in the last 72 hours. ABG No results for input(s): PHART, HCO3 in the last 72 hours.  Invalid input(s): PCO2, PO2  Studies/Results: DG Abd 2 Views Result Date: 02/15/2024 CLINICAL DATA:  Small bowel obstruction. EXAM: ABDOMEN - 2 VIEW COMPARISON:  Earlier same day FINDINGS: NG tube tip is in the distal stomach in the region the pylorus. Transpyloric extension into the proximal duodenum is not excluded. NG tube could be withdrawn 3-4 cm to ensure placement in the distal stomach. There is diffuse gaseous dilatation of small bowel up to 4.7 cm diameter. No substantial colonic gas although there may be some minimal gas in the right colon. Contrast material in the bladder is consistent with recent CT imaging. IMPRESSION: 1. NG tube tip is in the distal stomach in the region of the pylorus. Transpyloric extension into the  proximal duodenum is not excluded. NG tube could be withdrawn 3-4 cm to ensure placement in the distal stomach. 2. Diffuse gaseous dilatation of small bowel with minimal gas in the right colon. Imaging features remain compatible with small bowel obstruction with no substantial change when comparing to scout film from CT scan yesterday. Electronically Signed   By: Camellia Candle M.D.   On: 02/15/2024 07:47   DG Abd Portable 1 View Result Date: 02/15/2024 EXAM: 1 VIEW XRAY OF THE ABDOMEN 02/15/2024 01:31:00 AM COMPARISON: None available. CLINICAL HISTORY: NG Placement. FINDINGS: LINES, TUBES AND DEVICES: Enteric tube terminates in the mid gastric body. BOWEL: Nonobstructive bowel gas pattern. SOFT TISSUES: No abnormal calcifications. BONES: No acute fracture. LUNGS: Mild basilar atelectasis. IMPRESSION: 1. Enteric tube terminates in the mid gastric body. Electronically signed by: Pinkie Pebbles MD MD 02/15/2024 01:32 AM EST RP Workstation: [id number]   CT ABDOMEN PELVIS W CONTRAST Result Date: 02/14/2024 CLINICAL DATA:  Abdominal pain, nausea and vomiting, diarrhea for 2 days EXAM: CT ABDOMEN AND PELVIS WITH CONTRAST TECHNIQUE: Multidetector CT imaging of the abdomen and pelvis was performed using the standard protocol following bolus administration of intravenous contrast. RADIATION DOSE REDUCTION: This exam was performed according to the departmental dose-optimization program which includes automated exposure control, adjustment of the mA and/or kV according to patient size and/or use of iterative reconstruction technique. CONTRAST:  OMNIPAQUE  IOHEXOL  300 MG/ML  SOLN COMPARISON:  08/05/2019 FINDINGS: Lower chest:  No acute pleural or parenchymal lung disease. Hepatobiliary: No focal liver abnormality is seen. Status post cholecystectomy. No biliary dilatation. Pancreas: Unremarkable. No pancreatic ductal dilatation or surrounding inflammatory changes. Spleen: Normal in size without focal abnormality.  Adrenals/Urinary Tract: Adrenals are unremarkable. Simple cyst lower pole left kidney does not require specific follow-up. Otherwise the kidneys enhance normally. No urinary tract calculi or obstructive uropathy. Bladder is unremarkable. Stomach/Bowel: Dilation of the small bowel to the level of the distal jejunum, measuring up to 4.2 cm in maximal diameter with multiple scattered gas fluid levels, consistent with small-bowel obstruction. Transition from dilated to nondilated small bowel is seen within the right lower quadrant image 71/2. The colon is decompressed. Diffuse colonic diverticulosis without diverticulitis. No bowel wall thickening or inflammatory change. Prior appendectomy. Vascular/Lymphatic: Aortic atherosclerosis. No enlarged abdominal or pelvic lymph nodes. Reproductive: Prostate is unremarkable. Other: Trace pelvic free fluid. No free intraperitoneal gas. Small fat containing supraumbilical midline ventral hernia. Bilateral fat containing inguinal hernias. Musculoskeletal: No acute or destructive bony abnormalities. Reconstructed images demonstrate no additional findings. IMPRESSION: 1. Small-bowel obstruction, with transition point in the distal jejunum right lower quadrant. 2. Colonic diverticulosis without diverticulitis. 3. Small fat containing supraumbilical midline ventral hernia and bilateral inguinal hernias. No bowel herniation. 4.  Aortic Atherosclerosis (ICD10-I70.0). Electronically Signed   By: Ozell Daring M.D.   On: 02/14/2024 23:12    Anti-infectives: Anti-infectives (From admission, onward)    None       Assessment/Plan:  Patient with small bowel obstruction.  He has a history of small bowel obstructions that have all been treated so far nonoperatively.  Still having bilious output from his NG tube.  Recommend continue decompression today.  He can have it on August to walk around the unit for 15 to 20 minutes at a time.  Continue fluid resuscitation and correcting any  electrolyte imbalances.  If does not have bowel function today will likely start Gastrografin  challenge tomorrow morning.  Jayson Endow, M.D. [facility]

## 2024-02-15 NOTE — Assessment & Plan Note (Signed)
-   The patient will be admitted to a telemetry bed. - He will be kept NPO. - The patient had an NG tube inserted for decompression. - Will continue hydration with IV normal saline. - General Surgery consult was obtained and the patient was seen by Dr. Marinda will follow with us . - Will obtain two-view abdomen x-ray in a.m. for follow-up.

## 2024-02-15 NOTE — Assessment & Plan Note (Signed)
-   Will continue Ranexa  and hold off aspirin .

## 2024-02-15 NOTE — ED Notes (Signed)
 Per xray unable to use for NG placement without radiologist on hand.  No radiologist until Monday and requires at least 2 attempts by RN.

## 2024-02-16 ENCOUNTER — Inpatient Hospital Stay

## 2024-02-16 DIAGNOSIS — R197 Diarrhea, unspecified: Secondary | ICD-10-CM | POA: Diagnosis not present

## 2024-02-16 DIAGNOSIS — R112 Nausea with vomiting, unspecified: Secondary | ICD-10-CM

## 2024-02-16 DIAGNOSIS — K56609 Unspecified intestinal obstruction, unspecified as to partial versus complete obstruction: Secondary | ICD-10-CM | POA: Diagnosis not present

## 2024-02-16 LAB — COMPREHENSIVE METABOLIC PANEL WITH GFR
ALT: 11 U/L (ref 0–44)
AST: 18 U/L (ref 15–41)
Albumin: 3.8 g/dL (ref 3.5–5.0)
Alkaline Phosphatase: 49 U/L (ref 38–126)
Anion gap: 10 (ref 5–15)
BUN: 15 mg/dL (ref 8–23)
CO2: 26 mmol/L (ref 22–32)
Calcium: 8.5 mg/dL — ABNORMAL LOW (ref 8.9–10.3)
Chloride: 103 mmol/L (ref 98–111)
Creatinine, Ser: 0.95 mg/dL (ref 0.61–1.24)
GFR, Estimated: >60 mL/min
Glucose, Bld: 89 mg/dL (ref 70–99)
Potassium: 4.7 mmol/L (ref 3.5–5.1)
Sodium: 140 mmol/L (ref 135–145)
Total Bilirubin: 1.6 mg/dL — ABNORMAL HIGH (ref 0.0–1.2)
Total Protein: 6.3 g/dL — ABNORMAL LOW (ref 6.5–8.1)

## 2024-02-16 LAB — CBC WITH DIFFERENTIAL/PLATELET
Abs Immature Granulocytes: 0.04 K/uL (ref 0.00–0.07)
Basophils Absolute: 0 K/uL (ref 0.0–0.1)
Basophils Relative: 0 %
Eosinophils Absolute: 0.2 K/uL (ref 0.0–0.5)
Eosinophils Relative: 2 %
HCT: 41.8 % (ref 39.0–52.0)
Hemoglobin: 14 g/dL (ref 13.0–17.0)
Immature Granulocytes: 0 %
Lymphocytes Relative: 7 %
Lymphs Abs: 0.7 K/uL (ref 0.7–4.0)
MCH: 31.7 pg (ref 26.0–34.0)
MCHC: 33.5 g/dL (ref 30.0–36.0)
MCV: 94.6 fL (ref 80.0–100.0)
Monocytes Absolute: 0.9 K/uL (ref 0.1–1.0)
Monocytes Relative: 9 %
Neutro Abs: 8.3 K/uL — ABNORMAL HIGH (ref 1.7–7.7)
Neutrophils Relative %: 82 %
Platelets: 221 K/uL (ref 150–400)
RBC: 4.42 MIL/uL (ref 4.22–5.81)
RDW: 13.3 % (ref 11.5–15.5)
WBC: 10.2 K/uL (ref 4.0–10.5)
nRBC: 0 % (ref 0.0–0.2)

## 2024-02-16 MED ORDER — DIATRIZOATE MEGLUMINE & SODIUM 66-10 % PO SOLN
90.0000 mL | Freq: Once | ORAL | Status: AC
  Administered 2024-02-16: 90 mL via NASOGASTRIC

## 2024-02-16 MED ORDER — LACTATED RINGERS IV SOLN: INTRAVENOUS | Status: AC

## 2024-02-16 NOTE — Plan of Care (Signed)
  Problem: Clinical Measurements: Goal: Diagnostic test results will improve Outcome: Progressing   Problem: Clinical Measurements: Goal: Will remain free from infection Outcome: Progressing   Problem: Clinical Measurements: Goal: Ability to maintain clinical measurements within normal limits will improve Outcome: Progressing   Problem: Health Behavior/Discharge Planning: Goal: Ability to manage health-related needs will improve Outcome: Progressing

## 2024-02-16 NOTE — Plan of Care (Signed)
  Problem: Health Behavior/Discharge Planning: Goal: Ability to manage health-related needs will improve Outcome: Progressing   Problem: Activity: Goal: Risk for activity intolerance will decrease Outcome: Progressing   Problem: Nutrition: Goal: Adequate nutrition will be maintained Outcome: Progressing   

## 2024-02-16 NOTE — Progress Notes (Signed)
 [facility] SURGICAL PROGRESS NOTE (cpt [phone])  Hospital Day(s): 2.   Interval History: Patient seen and examined, no acute events or new complaints overnight. Patient reports he is feeling better. He remains distended but reports his abdominal pain is improved. No fever, chills, nausea,. No new labs this morning. No new imaging studies this AM. NGT output not recorded in chart. He has about 400 ccs in canister. He does report 3 episodes of flatus in last 24 hours.    Review of Systems:  Constitutional: denies fever, chills  HEENT: denies cough or congestion  Respiratory: denies any shortness of breath  Cardiovascular: denies chest pain or palpitations  Gastrointestinal: + distension, denied abdominal pain, nausea  Genitourinary: denies burning with urination or urinary frequency Musculoskeletal: denies pain, decreased motor or sensation  Vital signs in last 24 hours: [min-max] current  Temp:  [97.5 F (36.4 C)-98.4 F (36.9 C)] 98 F (36.7 C) (01/12 0821) Pulse Rate:  [72-88] 84 (01/12 0821) Resp:  [16-18] 18 (01/12 0821) BP: (127-162)/(68-76) 131/74 (01/12 0821) SpO2:  [90 %-95 %] 91 % (01/12 0821)     Height: 5' 10 (177.8 cm) Weight: 67.6 kg BMI (Calculated): 21.38   Intake/Output last 2 shifts:  01/11 0701 - 01/12 0700 In: 2054 [I.V.:2054] Out: 40 [Urine:40]   Physical Exam:  Constitutional: alert, cooperative and no distress  HENT: normocephalic without obvious abnormality; NGT in place; flushes easily, output stagnant appearing fluid  Eyes: PERRL, EOM's grossly intact and symmetric  Respiratory: breathing non-labored at rest  Cardiovascular: regular rate and sinus rhythm  Gastrointestinal: soft, non-tender, still distended and tympanic. No rebound/guarding.   Labs:     Latest Ref Rng & Units 02/15/2024    6:11 AM 02/14/2024    6:28 PM 01/25/2024    8:25 AM  CBC  WBC 4.0 - 10.5 K/uL 9.3  11.5  7.3   Hemoglobin 13.0 - 17.0 g/dL 86.3  84.9  85.4    Hematocrit 39.0 - 52.0 % 39.8  43.7  43.5   Platelets 150 - 400 K/uL 222  255  220       Latest Ref Rng & Units 02/15/2024    6:11 AM 02/14/2024    6:28 PM 01/25/2024    8:25 AM  CMP  Glucose 70 - 99 mg/dL 886  860  893   BUN 8 - 23 mg/dL 20  15  15    Creatinine 0.61 - 1.24 mg/dL 8.70  8.58  8.96   Sodium 135 - 145 mmol/L 137  138  136   Potassium 3.5 - 5.1 mmol/L 4.7  4.8  4.7   Chloride 98 - 111 mmol/L 102  99  102   CO2 22 - 32 mmol/L 25  27  26    Calcium  8.9 - 10.3 mg/dL 8.8  9.7  9.0   Total Protein 6.5 - 8.1 g/dL  7.3    Total Bilirubin 0.0 - 1.2 mg/dL  1.9    Alkaline Phos 38 - 126 U/L  53    AST 15 - 41 U/L  22    ALT 0 - 44 U/L  14       Imaging studies: No new pertinent imaging studies   Assessment/Plan:  84 y.o. male with small bowel obstruction   - I will get KUB this morning for more objective reassessment, although clinically, he appears to be resolving. If this is reassuring, we will continue NGT decompression for another 24 hours or so. If there remains concern  for obstruction/lack of improvements, I will proceed with gastrografin  challenge today   - No need for emergent surgical interventions   - Please record NGT output   - Continue NPO for now   - Monitor abdominal examination; on-going bowel function    - Pain control prn; antiemetics prn - Okay to clamp NGT to allow mobilization - Further management per primary service; we will follow    All of the above findings and recommendations were discussed with the patient,, and the medical team, and all of patient's questions were answered to his expressed satisfaction.  -- Arthea Platt, PA-C [facility] 02/16/2024, 8:59 AM M-F: 7am - 4pm

## 2024-02-16 NOTE — Progress Notes (Signed)
 " PROGRESS NOTE    Lonnie Schaefer  FMW:[id number] DOB: [birth year] DOA: 02/14/2024 PCP: Samie Frederick, PA-C  Chief Complaint  Patient presents with   Abdominal Pain   Diarrhea   Emesis    Hospital Course:  Lonnie Schaefer is an 84 year old male with COPD, CAD, inguinal hernia s/p repair and prior SBO, who presented to the emergency department with acute onset recurrent nausea and vomiting as well as diarrhea and abdominal pain.  Labs consistent with mild AKI, leukocytosis of 11.5.  CT abdomen pelvis with SBO, transition point in the distal jejunum.  Also revealed small fat-containing supraumbilical midline ventral hernia and bilateral inguinal hernias without bowel herniation.  Colonic diverticulosis without diverticulitis. Inguinal hernia was reduced by Dr. Viviann.  NG tube was inserted.  General surgery was consulted.  Subjective: No acute events overnight.  Continues to report that he is having flatus.  Objective: Vitals:   02/15/24 1958 02/16/24 0335 02/16/24 0731 02/16/24 0821  BP: (!) 148/71 129/76  131/74  Pulse: 88 87  84  Resp: 16 16  18   Temp: 98.4 F (36.9 C) 98.2 F (36.8 C)  98 F (36.7 C)  TempSrc:      SpO2: 90% 95% 93% 91%  Weight:      Height:        Intake/Output Summary (Last 24 hours) at 02/16/2024 1401 Last data filed at 02/16/2024 1300 Gross per 24 hour  Intake 2143.96 ml  Output 840 ml  Net 1303.96 ml   Filed Weights   02/14/24 1826 02/15/24 0642  Weight: 69.8 kg 67.6 kg    Examination: General exam: Appears calm and comfortable, NAD  Respiratory system: No work of breathing, symmetric chest wall expansion Cardiovascular system: S1 & S2 heard, RRR.  Gastrointestinal system: Abdomen is nondistended, soft and nontender.  Bowel sounds present Neuro: Alert and oriented.  Psychiatry: Demonstrates appropriate judgement and insight. Mood & affect appropriate for situation.   Assessment & Plan:  Principal Problem:   Small bowel obstruction (HCC) Active  Problems:   SBO (small bowel obstruction) (HCC)   Chronic obstructive pulmonary disease (COPD) (HCC)   Coronary artery disease   Hypothyroidism    Bowel obstruction - Does appear to be improving slowly - Currently on NG to LIS.  For now continue with decompression - Okay to remove suction briefly for 15 to 20 minutes so patient may ambulate around the unit - Continue with fluid resuscitation, has had minimal urine output.  Very small PVR - General Surgery proceeding with Gastrografin  challenge today. - Of note he has had multiple prior small bowel obstructions, all of which have been treated nonoperatively.  COPD, not currently in exacerbation Nocturnal hypoxia - Continue home inhalers - O2 at night  CAD - Stable.  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  S/p radiation therapy - Following outpatient with ENT and oncology  Acquired hypothyroidism - Continue Synthroid   Osteoporosis - Continue outpatient follow-up  DVT prophylaxis: Heparin    Code Status: Do not attempt resuscitation (DNR) PRE-ARREST INTERVENTIONS DESIRED Disposition:  Inpatient pending clinical resolution  Consultants:  Treatment Team:  Consulting Physician: Marinda Jayson KIDD, MD  Procedures:    Antimicrobials:  Anti-infectives (From admission, onward)    None       Data Reviewed: I have personally reviewed following labs and imaging studies CBC: Recent Labs  Lab 02/14/24 1828 02/15/24 0611 02/16/24 0924  WBC 11.5* 9.3 10.2  NEUTROABS  --   --  8.3*  HGB 15.0 13.6 14.0  HCT 43.7 39.8 41.8  MCV 92.4 92.1 94.6  PLT 255 222 221   Basic Metabolic Panel: Recent Labs  Lab 02/14/24 1828 02/15/24 0611 02/16/24 0924  NA 138 137 140  K 4.8 4.7 4.7  CL 99 102 103  CO2 27 25 26   GLUCOSE 139* 113* 89  BUN 15 20 15   CREATININE 1.41* 1.29* 0.95  CALCIUM  9.7 8.8* 8.5*   GFR: Estimated Creatinine Clearance: 58.3  mL/min (by C-G formula based on SCr of 0.95 mg/dL). Liver Function Tests: Recent Labs  Lab 02/14/24 1828 02/16/24 0924  AST 22 18  ALT 14 11  ALKPHOS 53 49  BILITOT 1.9* 1.6*  PROT 7.3 6.3*  ALBUMIN 4.4 3.8   CBG: No results for input(s): GLUCAP in the last 168 hours.  Recent Results (from the past 240 hours)  Resp panel by RT-PCR (RSV, Flu A&B, Covid) Anterior Nasal Swab     Status: None   Collection Time: 02/14/24  9:53 PM   Specimen: Anterior Nasal Swab  Result Value Ref Range Status   SARS Coronavirus 2 by RT PCR NEGATIVE NEGATIVE Final    Comment: (NOTE) SARS-CoV-2 target nucleic acids are NOT DETECTED.  The SARS-CoV-2 RNA is generally detectable in upper respiratory specimens during the acute phase of infection. The lowest concentration of SARS-CoV-2 viral copies this assay can detect is 138 copies/mL. A negative result does not preclude SARS-Cov-2 infection and should not be used as the sole basis for treatment or other patient management decisions. A negative result may occur with  improper specimen collection/handling, submission of specimen other than nasopharyngeal swab, presence of viral mutation(s) within the areas targeted by this assay, and inadequate number of viral copies(<138 copies/mL). A negative result must be combined with clinical observations, patient history, and epidemiological information. The expected result is Negative.  Fact Sheet for Patients:  bloggercourse.com  Fact Sheet for Healthcare Providers:  seriousbroker.it  This test is no t yet approved or cleared by the [place]  FDA and  has been authorized for detection and/or diagnosis of SARS-CoV-2 by FDA under an Emergency Use Authorization (EUA). This EUA will remain  in effect (meaning this test can be used) for the duration of the COVID-19 declaration under Section 564(b)(1) of the Act, 21 U.S.C.section 360bbb-3(b)(1), unless the  authorization is terminated  or revoked sooner.       Influenza A by PCR NEGATIVE NEGATIVE Final   Influenza B by PCR NEGATIVE NEGATIVE Final    Comment: (NOTE) The Xpert Xpress SARS-CoV-2/FLU/RSV plus assay is intended as an aid in the diagnosis of influenza from Nasopharyngeal swab specimens and should not be used as a sole basis for treatment. Nasal washings and aspirates are unacceptable for Xpert Xpress SARS-CoV-2/FLU/RSV testing.  Fact Sheet for Patients: bloggercourse.com  Fact Sheet for Healthcare Providers: seriousbroker.it  This test is not yet approved or cleared by the [place]  FDA and has been authorized for detection and/or diagnosis of SARS-CoV-2 by FDA under an Emergency Use Authorization (EUA). This EUA will remain in effect (meaning this test can be used) for the duration of the COVID-19 declaration under Section 564(b)(1) of the Act, 21 U.S.C. section 360bbb-3(b)(1), unless the authorization is terminated or revoked.     Resp Syncytial Virus by PCR NEGATIVE NEGATIVE Final    Comment: (NOTE) Fact Sheet for Patients: bloggercourse.com  Fact Sheet for Healthcare Providers: seriousbroker.it  This test is not yet  approved or cleared by the [place]  FDA and has been authorized for detection and/or diagnosis of SARS-CoV-2 by FDA under an Emergency Use Authorization (EUA). This EUA will remain in effect (meaning this test can be used) for the duration of the COVID-19 declaration under Section 564(b)(1) of the Act, 21 U.S.C. section 360bbb-3(b)(1), unless the authorization is terminated or revoked.  Performed at [facility], [address] [place], [place] [address]      Radiology Studies: DG Abd 2 Views Result Date: 02/15/2024 CLINICAL DATA:  Small bowel obstruction. EXAM: ABDOMEN - 2 VIEW COMPARISON:  Earlier same day FINDINGS: NG tube  tip is in the distal stomach in the region the pylorus. Transpyloric extension into the proximal duodenum is not excluded. NG tube could be withdrawn 3-4 cm to ensure placement in the distal stomach. There is diffuse gaseous dilatation of small bowel up to 4.7 cm diameter. No substantial colonic gas although there may be some minimal gas in the right colon. Contrast material in the bladder is consistent with recent CT imaging. IMPRESSION: 1. NG tube tip is in the distal stomach in the region of the pylorus. Transpyloric extension into the proximal duodenum is not excluded. NG tube could be withdrawn 3-4 cm to ensure placement in the distal stomach. 2. Diffuse gaseous dilatation of small bowel with minimal gas in the right colon. Imaging features remain compatible with small bowel obstruction with no substantial change when comparing to scout film from CT scan yesterday. Electronically Signed   By: Camellia Candle M.D.   On: 02/15/2024 07:47   DG Abd Portable 1 View Result Date: 02/15/2024 EXAM: 1 VIEW XRAY OF THE ABDOMEN 02/15/2024 01:31:00 AM COMPARISON: None available. CLINICAL HISTORY: NG Placement. FINDINGS: LINES, TUBES AND DEVICES: Enteric tube terminates in the mid gastric body. BOWEL: Nonobstructive bowel gas pattern. SOFT TISSUES: No abnormal calcifications. BONES: No acute fracture. LUNGS: Mild basilar atelectasis. IMPRESSION: 1. Enteric tube terminates in the mid gastric body. Electronically signed by: Pinkie Pebbles MD MD 02/15/2024 01:32 AM EST RP Workstation: [id number]   CT ABDOMEN PELVIS W CONTRAST Result Date: 02/14/2024 CLINICAL DATA:  Abdominal pain, nausea and vomiting, diarrhea for 2 days EXAM: CT ABDOMEN AND PELVIS WITH CONTRAST TECHNIQUE: Multidetector CT imaging of the abdomen and pelvis was performed using the standard protocol following bolus administration of intravenous contrast. RADIATION DOSE REDUCTION: This exam was performed according to the departmental dose-optimization  program which includes automated exposure control, adjustment of the mA and/or kV according to patient size and/or use of iterative reconstruction technique. CONTRAST:  OMNIPAQUE  IOHEXOL  300 MG/ML  SOLN COMPARISON:  08/05/2019 FINDINGS: Lower chest: No acute pleural or parenchymal lung disease. Hepatobiliary: No focal liver abnormality is seen. Status post cholecystectomy. No biliary dilatation. Pancreas: Unremarkable. No pancreatic ductal dilatation or surrounding inflammatory changes. Spleen: Normal in size without focal abnormality. Adrenals/Urinary Tract: Adrenals are unremarkable. Simple cyst lower pole left kidney does not require specific follow-up. Otherwise the kidneys enhance normally. No urinary tract calculi or obstructive uropathy. Bladder is unremarkable. Stomach/Bowel: Dilation of the small bowel to the level of the distal jejunum, measuring up to 4.2 cm in maximal diameter with multiple scattered gas fluid levels, consistent with small-bowel obstruction. Transition from dilated to nondilated small bowel is seen within the right lower quadrant image 71/2. The colon is decompressed. Diffuse colonic diverticulosis without diverticulitis. No bowel wall thickening or inflammatory change. Prior appendectomy. Vascular/Lymphatic: Aortic atherosclerosis. No enlarged abdominal or pelvic lymph nodes. Reproductive: Prostate is unremarkable. Other:  Trace pelvic free fluid. No free intraperitoneal gas. Small fat containing supraumbilical midline ventral hernia. Bilateral fat containing inguinal hernias. Musculoskeletal: No acute or destructive bony abnormalities. Reconstructed images demonstrate no additional findings. IMPRESSION: 1. Small-bowel obstruction, with transition point in the distal jejunum right lower quadrant. 2. Colonic diverticulosis without diverticulitis. 3. Small fat containing supraumbilical midline ventral hernia and bilateral inguinal hernias. No bowel herniation. 4.  Aortic  Atherosclerosis (ICD10-I70.0). Electronically Signed   By: Ozell Daring M.D.   On: 02/14/2024 23:12    Scheduled Meds:  arformoterol   15 mcg Nebulization BID   And   umeclidinium bromide   1 puff Inhalation Daily   heparin  injection (subcutaneous)  5,000 Units Subcutaneous Q12H   levothyroxine   75 mcg Per Tube Q0600   ranolazine   500 mg Oral BID   Continuous Infusions:  lactated ringers  100 mL/hr at 02/16/24 0919     LOS: 2 days  MDM: Patient is high risk for one or more organ failure.  They necessitate ongoing hospitalization for continued IV therapies and subsequent lab monitoring. Total time spent interpreting labs and vitals, reviewing the medical record, coordinating care amongst consultants and care team members, directly assessing and discussing care with the patient and/or family: 55 min  Kenzlie Disch, DO [facility]  To contact the attending physician between 7A-7P please use Epic Chat. To contact the covering physician during after hours 7P-7A, please review Amion.  02/16/2024, 2:01 PM   *This document has been created with the assistance of dictation software. Please excuse typographical errors. *   "

## 2024-02-17 ENCOUNTER — Inpatient Hospital Stay

## 2024-02-17 DIAGNOSIS — K56609 Unspecified intestinal obstruction, unspecified as to partial versus complete obstruction: Secondary | ICD-10-CM | POA: Diagnosis not present

## 2024-02-17 LAB — CBC WITH DIFFERENTIAL/PLATELET
Abs Immature Granulocytes: 0.04 K/uL (ref 0.00–0.07)
Basophils Absolute: 0 K/uL (ref 0.0–0.1)
Basophils Relative: 0 %
Eosinophils Absolute: 0.2 K/uL (ref 0.0–0.5)
Eosinophils Relative: 2 %
HCT: 38.8 % — ABNORMAL LOW (ref 39.0–52.0)
Hemoglobin: 13 g/dL (ref 13.0–17.0)
Immature Granulocytes: 0 %
Lymphocytes Relative: 8 %
Lymphs Abs: 0.7 K/uL (ref 0.7–4.0)
MCH: 31.9 pg (ref 26.0–34.0)
MCHC: 33.5 g/dL (ref 30.0–36.0)
MCV: 95.1 fL (ref 80.0–100.0)
Monocytes Absolute: 1 K/uL (ref 0.1–1.0)
Monocytes Relative: 11 %
Neutro Abs: 7.3 K/uL (ref 1.7–7.7)
Neutrophils Relative %: 79 %
Platelets: 204 K/uL (ref 150–400)
RBC: 4.08 MIL/uL — ABNORMAL LOW (ref 4.22–5.81)
RDW: 13 % (ref 11.5–15.5)
WBC: 9.3 K/uL (ref 4.0–10.5)
nRBC: 0 % (ref 0.0–0.2)

## 2024-02-17 LAB — COMPREHENSIVE METABOLIC PANEL WITH GFR
ALT: 18 U/L (ref 0–44)
AST: 26 U/L (ref 15–41)
Albumin: 3.6 g/dL (ref 3.5–5.0)
Alkaline Phosphatase: 44 U/L (ref 38–126)
Anion gap: 9 (ref 5–15)
BUN: 12 mg/dL (ref 8–23)
CO2: 30 mmol/L (ref 22–32)
Calcium: 8.7 mg/dL — ABNORMAL LOW (ref 8.9–10.3)
Chloride: 101 mmol/L (ref 98–111)
Creatinine, Ser: 0.83 mg/dL (ref 0.61–1.24)
GFR, Estimated: >60 mL/min
Glucose, Bld: 98 mg/dL (ref 70–99)
Potassium: 4.1 mmol/L (ref 3.5–5.1)
Sodium: 139 mmol/L (ref 135–145)
Total Bilirubin: 1.5 mg/dL — ABNORMAL HIGH (ref 0.0–1.2)
Total Protein: 6.1 g/dL — ABNORMAL LOW (ref 6.5–8.1)

## 2024-02-17 NOTE — Progress Notes (Signed)
 [facility] SURGICAL PROGRESS NOTE (cpt [id number])  Hospital Day(s): 3.   Interval History: Patient seen and examined, no acute events or new complaints overnight. Patient reports he is feeling better. Abdomen is sore but not overtly tender. No fever, chills, nausea, emesis. No new labs this morning. NGT output continues to be elevated; 1550 ccs in last 24 hours. He did have KUB with gastrografin  and at 8 hours this was seen in right colon. He does have BM recorded in last 24 hours ands is passing flatus.   Review of Systems:  Constitutional: denies fever, chills  HEENT: denies cough or congestion  Respiratory: denies any shortness of breath  Cardiovascular: denies chest pain or palpitations  Gastrointestinal: denied abdominal pain, nausea  Genitourinary: denies burning with urination or urinary frequency Musculoskeletal: denies pain, decreased motor or sensation  Vital signs in last 24 hours: [min-max] current  Temp:  [97.8 F (36.6 C)-98.1 F (36.7 C)] 98.1 F (36.7 C) (01/13 0855) Pulse Rate:  [84-95] 84 (01/13 0855) Resp:  [18] 18 (01/13 0855) BP: (129-147)/(58-77) 129/58 (01/13 0855) SpO2:  [92 %-95 %] 92 % (01/13 0855)     Height: 5' 10 (177.8 cm) Weight: 67.6 kg BMI (Calculated): 21.38   Intake/Output last 2 shifts:  01/12 0701 - 01/13 0700 In: 2034.5 [I.V.:1744.5; NG/GT:290] Out: 1900 [Urine:350; Emesis/NG output:1550]   Physical Exam:  Constitutional: alert, cooperative and no distress  HENT: normocephalic without obvious abnormality; NGT in place; flushes easily, output thinning  Eyes: PERRL, EOM's grossly intact and symmetric  Respiratory: breathing non-labored at rest  Cardiovascular: regular rate and sinus rhythm  Gastrointestinal: soft, non-tender, still distended and tympanic. No rebound/guarding.   Labs:     Latest Ref Rng & Units 02/16/2024    9:24 AM 02/15/2024    6:11 AM 02/14/2024    6:28 PM  CBC  WBC 4.0 - 10.5 K/uL 10.2  9.3  11.5    Hemoglobin 13.0 - 17.0 g/dL 85.9  86.3  84.9   Hematocrit 39.0 - 52.0 % 41.8  39.8  43.7   Platelets 150 - 400 K/uL 221  222  255       Latest Ref Rng & Units 02/16/2024    9:24 AM 02/15/2024    6:11 AM 02/14/2024    6:28 PM  CMP  Glucose 70 - 99 mg/dL 89  886  860   BUN 8 - 23 mg/dL 15  20  15    Creatinine 0.61 - 1.24 mg/dL 9.04  8.70  8.58   Sodium 135 - 145 mmol/L 140  137  138   Potassium 3.5 - 5.1 mmol/L 4.7  4.7  4.8   Chloride 98 - 111 mmol/L 103  102  99   CO2 22 - 32 mmol/L 26  25  27    Calcium  8.9 - 10.3 mg/dL 8.5  8.8  9.7   Total Protein 6.5 - 8.1 g/dL 6.3   7.3   Total Bilirubin 0.0 - 1.2 mg/dL 1.6   1.9   Alkaline Phos 38 - 126 U/L 49   53   AST 15 - 41 U/L 18   22   ALT 0 - 44 U/L 11   14      Imaging studies:   KUB (02/17/2024) personally reviewed with decrease in small bowel distension, contrast throughout the colon, and radiologist report pending...   Assessment/Plan:  84 y.o. male with small bowel obstruction   - He is having bowel function and KUB with contrast  throughout the colon. His NGT output was still elevated, so I will plan for NGT gravity trial today x4 hours. If residuals are <150 ccs, we can consider removal and starting diet   - Continue NPO for now  - No need for emergent surgical interventions   - Monitor abdominal examination; on-going bowel function    - Pain control prn; antiemetics prn - Okay to clamp NGT to allow mobilization - Further management per primary service; we will follow    All of the above findings and recommendations were discussed with the patient,, and the medical team, and all of patient's questions were answered to his expressed satisfaction.  -- Arthea Platt, PA-C  [facility] 02/17/2024, 9:21 AM M-F: 7am - 4pm

## 2024-02-17 NOTE — Evaluation (Signed)
 Occupational Therapy Evaluation Patient Details Name: Lonnie Schaefer MRN: [id number] DOB: Apr 26, 1940 Today's Date: 02/17/2024   History of Present Illness   Pt is an 84 yo male that presented to ED for SBO, NG tube placed. PMH of  COPD, CAD, inguinal hernia s/p repair and prior SBO, carcinoma of junction of hard/soft palate.     Clinical Impressions Pt was seen for OT evaluation this date. Prior to hospital admission, pt was independent and providing some limited assist for spouse. Pt presents with mild deficits in activity tolerance and BLE strength limiting their ability to perform ADL management at baseline level. Pt currently requires increased time/effort to complete ADL and mobility using RW. Pt edu in ECS including activity pacing, work simplification, AE/DME, home/routines modifications, PLB, and benefits of using a pulse oximeter at home to monitor vitals. Pt verbalized understanding. Provided with handout to support recall and carryover. Pt able to verbalize good understanding of learned education/training as well as prior home routines/habits that support gradual return to PLOF. Do not anticipate the need for additional follow up OT services at this time. Will sign off.    If plan is discharge home, recommend the following:         Functional Status Assessment   Patient has had a recent decline in their functional status and demonstrates the ability to make significant improvements in function in a reasonable and predictable amount of time.     Equipment Recommendations   None recommended by OT     Recommendations for Other Services         Precautions/Restrictions   Precautions Precautions: Fall Recall of Precautions/Restrictions: Intact Restrictions Weight Bearing Restrictions Per Provider Order: No     Mobility Bed Mobility Overal bed mobility: Modified Independent                  Transfers Overall transfer level: Needs assistance Equipment  used: Rolling walker (2 wheels) Transfers: Sit to/from Stand Sit to Stand: Supervision                  Balance Overall balance assessment: Mild deficits observed, not formally tested                                         ADL either performed or assessed with clinical judgement   ADL Overall ADL's : Modified independent                                             Vision         Perception         Praxis         Pertinent Vitals/Pain Pain Assessment Pain Assessment: No/denies pain     Extremity/Trunk Assessment Upper Extremity Assessment Upper Extremity Assessment: Difficult to assess due to impaired cognition   Lower Extremity Assessment Lower Extremity Assessment: Generalized weakness       Communication Communication Communication: No apparent difficulties   Cognition Arousal: Alert Behavior During Therapy: WFL for tasks assessed/performed Cognition: No apparent impairments                               Following commands: Intact       Cueing  General Comments   Cueing Techniques: Verbal cues      Exercises Other Exercises Other Exercises: Pt edu in ECS including activity pacing, work simplification, AE/DME, home/routines modifications, PLB, and benefits of using a pulse oximeter at home to monitor vitals.   Shoulder Instructions      Home Living Family/patient expects to be discharged to:: Private residence Living Arrangements: Spouse/significant other Available Help at Discharge: Available 24 hours/day Type of Home: House Home Access: Stairs to enter [employer] of Steps: 3 Entrance Stairs-Rails: Left Home Layout: One level     Bathroom Shower/Tub: Chief Strategy Officer: Standard     Home Equipment: Cane - single point;Wheelchair - Forensic Psychologist (2 wheels);Wheelchair - power;Shower seat;Grab bars - tub/shower;Grab bars - toilet           Prior Functioning/Environment Prior Level of Function : Independent/Modified Independent;Driving                    OT Problem List: Decreased strength;Decreased activity tolerance   OT Treatment/Interventions:        OT Goals(Current goals can be found in the care plan section)   Acute Rehab OT Goals Patient Stated Goal: get better and go home OT Goal Formulation: All assessment and education complete, DC therapy   OT Frequency:       Co-evaluation              AM-PAC OT 6 Clicks Daily Activity     Outcome Measure Help from another person eating meals?: None Help from another person taking care of personal grooming?: None Help from another person toileting, which includes using toliet, bedpan, or urinal?: None Help from another person bathing (including washing, rinsing, drying)?: None Help from another person to put on and taking off regular upper body clothing?: None Help from another person to put on and taking off regular lower body clothing?: None 6 Click Score: 24   End of Session Equipment Utilized During Treatment: Oxygen   Activity Tolerance: Patient tolerated treatment well Patient left: in bed;with call bell/phone within reach;with bed alarm set  OT Visit Diagnosis: Other abnormalities of gait and mobility (R26.89)                Time: 8381-8368 OT Time Calculation (min): 13 min Charges:  OT General Charges $OT Visit: 1 Visit OT Evaluation $OT Eval Low Complexity: 1 Low OT Treatments $Therapeutic Activity: 8-22 mins  Warren SAUNDERS., MPH, MS, OTR/L ascom [phone] 02/17/2024, 4:44 PM

## 2024-02-17 NOTE — Plan of Care (Signed)

## 2024-02-17 NOTE — Evaluation (Signed)
 Physical Therapy Evaluation Patient Details Name: Lonnie Schaefer MRN: [id number] DOB: [birth year] Today's Date: 02/17/2024  History of Present Illness  Pt is an 84 yo male that presented to ED for SBO, NG tube placed. PMH of  COPD, CAD, inguinal hernia s/p repair and prior SBO, carcinoma of junction of hard/soft palate.  Clinical Impression  Patient alert, agreeable to PT, seated EOB with broth. He reported at baseline he is ambulatory without AD, performs ADLs and IADLs independently, helps his wife as needed. Reported mild abdominal pain but did not quantify. He was able to sit <> stand with supervision, and ambulated with RW, >311ft. He did display flexed trunk and RW outside BOS, but no LOB. Pt resistive to ambulating without RW at this time citing he felt safer with it currently. Due to this the patient would benefit from further skilled PT intervention to ensure return to PLOF.         If plan is discharge home, recommend the following: Help with stairs or ramp for entrance   Can travel by private vehicle        Equipment Recommendations None recommended by PT  Recommendations for Other Services       Functional Status Assessment Patient has had a recent decline in their functional status and demonstrates the ability to make significant improvements in function in a reasonable and predictable amount of time.     Precautions / Restrictions Precautions Precautions: Fall Recall of Precautions/Restrictions: Intact Restrictions Weight Bearing Restrictions Per Provider Order: No      Mobility  Bed Mobility               General bed mobility comments: seated EOB start/end of session    Transfers Overall transfer level: Needs assistance Equipment used: Rolling walker (2 wheels) Transfers: Sit to/from Stand Sit to Stand: Supervision                Ambulation/Gait Ambulation/Gait assistance: Supervision Gait Distance (Feet): 350 Feet Assistive device: Rolling  walker (2 wheels)         General Gait Details: RW tends to be outside BOS because of kyphotic posture but no LOB  Stairs            Wheelchair Mobility     Tilt Bed    Modified Rankin (Stroke Patients Only)       Balance Overall balance assessment: Needs assistance Sitting-balance support: Feet supported Sitting balance-Leahy Scale: Normal     Standing balance support: Bilateral upper extremity supported Standing balance-Leahy Scale: Good                               Pertinent Vitals/Pain Pain Assessment Pain Assessment: Faces Faces Pain Scale: No hurt    Home Living Family/patient expects to be discharged to:: Private residence Living Arrangements: Spouse/significant other Available Help at Discharge: Available 24 hours/day Type of Home: House Home Access: Stairs to enter Entrance Stairs-Rails: Left Entrance Stairs-Number of Steps: 3   Home Layout: One level Home Equipment: Cane - single point;Wheelchair - Forensic Psychologist (2 wheels);Wheelchair - power;Shower seat      Prior Function Prior Level of Function : Independent/Modified Independent;Driving                     Extremity/Trunk Assessment   Upper Extremity Assessment Upper Extremity Assessment: Overall WFL for tasks assessed    Lower Extremity Assessment Lower Extremity Assessment: Generalized weakness  Communication        Cognition Arousal: Alert Behavior During Therapy: WFL for tasks assessed/performed   PT - Cognitive impairments: No apparent impairments                         Following commands: Intact       Cueing       General Comments      Exercises     Assessment/Plan    PT Assessment Patient needs continued PT services  PT Problem List Decreased activity tolerance;Decreased mobility       PT Treatment Interventions DME instruction;Therapeutic activities;Gait training;Therapeutic exercise;Patient/family  education;Stair training;Balance training;Functional mobility training;Neuromuscular re-education    PT Goals (Current goals can be found in the Care Plan section)  Acute Rehab PT Goals Patient Stated Goal: to go home PT Goal Formulation: With patient Time For Goal Achievement: 03/02/24 Potential to Achieve Goals: Good    Frequency Min 2X/week     Co-evaluation               AM-PAC PT 6 Clicks Mobility  Outcome Measure Help needed turning from your back to your side while in a flat bed without using bedrails?: None Help needed moving from lying on your back to sitting on the side of a flat bed without using bedrails?: None Help needed moving to and from a bed to a chair (including a wheelchair)?: None Help needed standing up from a chair using your arms (e.g., wheelchair or bedside chair)?: None Help needed to walk in hospital room?: None Help needed climbing 3-5 steps with a railing? : None 6 Click Score: 24    End of Session   Activity Tolerance: Patient tolerated treatment well Patient left: Other (comment);with call bell/phone within reach (seated EOB)   PT Visit Diagnosis: Other abnormalities of gait and mobility (R26.89)    Time: 1339-1350 PT Time Calculation (min) (ACUTE ONLY): 11 min   Charges:   PT Evaluation $PT Eval Low Complexity: 1 Low   PT General Charges $$ ACUTE PT VISIT: 1 Visit         Doyal Shams PT, DPT 3:43 PM,02/17/2024

## 2024-02-17 NOTE — Care Management Important Message (Signed)
 Important Message  Patient Details  Name: Lonnie Schaefer MRN: [id number] Date of Birth: 1940/09/11   Important Message Given:  Yes - Medicare IM     Mahamud Metts W, CMA 02/17/2024, 11:55 AM

## 2024-02-17 NOTE — Progress Notes (Addendum)
 " PROGRESS NOTE    RAFAN Schaefer  FMW:[id number] DOB: 1940-07-09 DOA: 02/14/2024 PCP: Samie Frederick, PA-C  Chief Complaint  Patient presents with   Abdominal Pain   Diarrhea   Emesis    Hospital Course:  Lonnie Schaefer is an 84 year old male with COPD, CAD, inguinal hernia s/p repair and prior SBO, who presented to the emergency department with acute onset recurrent nausea and vomiting as well as diarrhea and abdominal pain.  Labs consistent with mild AKI, leukocytosis of 11.5.  CT abdomen pelvis with SBO, transition point in the distal jejunum.  Also revealed small fat-containing supraumbilical midline ventral hernia and bilateral inguinal hernias without bowel herniation.  Colonic diverticulosis without diverticulitis. Inguinal hernia was reduced by Dr. Viviann.  NG tube was inserted.  General surgery was consulted.  Patient has had gradual return of bowel function, even though his NGT output has been high.  NG tube clamped with minimal residual on 1/13, patient was started on liquids.  Subjective: No acute events overnight.  Patient reports ongoing flatulence.  Also endorses large bowel movement earlier this morning.  Denies any abdominal pain.  He is very anxious to begin eating  Objective: Vitals:   02/16/24 1703 02/16/24 1941 02/17/24 0350 02/17/24 0855  BP: (!) 141/68 (!) 147/77 (!) 141/71 (!) 129/58  Pulse: 95 89 87 84  Resp: 18 18 18 18   Temp: 98 F (36.7 C) 98.1 F (36.7 C) 97.8 F (36.6 C) 98.1 F (36.7 C)  TempSrc:  Oral    SpO2: 94% 95% 92% 92%  Weight:      Height:        Intake/Output Summary (Last 24 hours) at 02/17/2024 1300 Last data filed at 02/17/2024 1232 Gross per 24 hour  Intake 1944.51 ml  Output 1105 ml  Net 839.51 ml   Filed Weights   02/14/24 1826 02/15/24 0642  Weight: 69.8 kg 67.6 kg    Examination: General exam: Appears calm and comfortable, NAD  Respiratory system: No work of breathing, symmetric chest wall expansion Cardiovascular system:  S1 & S2 heard, RRR.  Gastrointestinal system: Abdomen is nondistended, soft and nontender.  Bowel sounds present.  NG tube in place Neuro: Alert and oriented.  Psychiatry: Demonstrates appropriate judgement and insight. Mood & affect appropriate for situation.   Assessment & Plan:  Principal Problem:   Small bowel obstruction (HCC) Active Problems:   Nausea vomiting and diarrhea   SBO (small bowel obstruction) (HCC)   Chronic obstructive pulmonary disease (COPD) (HCC)   Coronary artery disease   Hypothyroidism    Bowel obstruction - Had returned of bowel function.  Flatus and BM this a.m. - NGT clamped this a.m.  Minimal residual after 4 hours clamping trial - Will introduce clear liquid diet and remove NGT. - If patient remains without nausea vomiting can further advance diet - Of note he has had multiple prior small bowel obstructions, all of which have been treated nonoperatively. - General Surgery still following - PT/OT ordered  COPD, not currently in exacerbation Nocturnal hypoxia - Continue home inhalers - O2 at night  CAD - Stable.  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  S/p radiation therapy - Following outpatient with ENT and oncology  Acquired hypothyroidism - Continue Synthroid   Osteoporosis - Continue outpatient follow-up  DVT prophylaxis: Heparin    Code Status: Do not attempt resuscitation (DNR) PRE-ARREST INTERVENTIONS DESIRED Disposition: Initiating diet today.  If  tolerating and able to advance we will plan for DC in a.m.  Consultants:  Treatment Team:  Consulting Physician: Marinda Jayson KIDD, MD  Procedures:    Antimicrobials:  Anti-infectives (From admission, onward)    None       Data Reviewed: I have personally reviewed following labs and imaging studies CBC: Recent Labs  Lab 02/14/24 1828 02/15/24 0611 02/16/24 0924  WBC 11.5* 9.3 10.2  NEUTROABS   --   --  8.3*  HGB 15.0 13.6 14.0  HCT 43.7 39.8 41.8  MCV 92.4 92.1 94.6  PLT 255 222 221   Basic Metabolic Panel: Recent Labs  Lab 02/14/24 1828 02/15/24 0611 02/16/24 0924  NA 138 137 140  K 4.8 4.7 4.7  CL 99 102 103  CO2 27 25 26   GLUCOSE 139* 113* 89  BUN 15 20 15   CREATININE 1.41* 1.29* 0.95  CALCIUM  9.7 8.8* 8.5*   GFR: Estimated Creatinine Clearance: 58.3 mL/min (by C-G formula based on SCr of 0.95 mg/dL). Liver Function Tests: Recent Labs  Lab 02/14/24 1828 02/16/24 0924  AST 22 18  ALT 14 11  ALKPHOS 53 49  BILITOT 1.9* 1.6*  PROT 7.3 6.3*  ALBUMIN 4.4 3.8   CBG: No results for input(s): GLUCAP in the last 168 hours.  Recent Results (from the past 240 hours)  Resp panel by RT-PCR (RSV, Flu A&B, Covid) Anterior Nasal Swab     Status: None   Collection Time: 02/14/24  9:53 PM   Specimen: Anterior Nasal Swab  Result Value Ref Range Status   SARS Coronavirus 2 by RT PCR NEGATIVE NEGATIVE Final    Comment: (NOTE) SARS-CoV-2 target nucleic acids are NOT DETECTED.  The SARS-CoV-2 RNA is generally detectable in upper respiratory specimens during the acute phase of infection. The lowest concentration of SARS-CoV-2 viral copies this assay can detect is 138 copies/mL. A negative result does not preclude SARS-Cov-2 infection and should not be used as the sole basis for treatment or other patient management decisions. A negative result may occur with  improper specimen collection/handling, submission of specimen other than nasopharyngeal swab, presence of viral mutation(s) within the areas targeted by this assay, and inadequate number of viral copies(<138 copies/mL). A negative result must be combined with clinical observations, patient history, and epidemiological information. The expected result is Negative.  Fact Sheet for Patients:  bloggercourse.com  Fact Sheet for Healthcare Providers:   seriousbroker.it  This test is no t yet approved or cleared by the [place]  FDA and  has been authorized for detection and/or diagnosis of SARS-CoV-2 by FDA under an Emergency Use Authorization (EUA). This EUA will remain  in effect (meaning this test can be used) for the duration of the COVID-19 declaration under Section 564(b)(1) of the Act, 21 U.S.C.section 360bbb-3(b)(1), unless the authorization is terminated  or revoked sooner.       Influenza A by PCR NEGATIVE NEGATIVE Final   Influenza B by PCR NEGATIVE NEGATIVE Final    Comment: (NOTE) The Xpert Xpress SARS-CoV-2/FLU/RSV plus assay is intended as an aid in the diagnosis of influenza from Nasopharyngeal swab specimens and should not be used as a sole basis for treatment. Nasal washings and aspirates are unacceptable for Xpert Xpress SARS-CoV-2/FLU/RSV testing.  Fact Sheet for Patients: bloggercourse.com  Fact Sheet for Healthcare Providers: seriousbroker.it  This test is not yet approved or cleared by the [place]  FDA and has been authorized for detection and/or diagnosis of SARS-CoV-2 by FDA under an Emergency Use  Authorization (EUA). This EUA will remain in effect (meaning this test can be used) for the duration of the COVID-19 declaration under Section 564(b)(1) of the Act, 21 U.S.C. section 360bbb-3(b)(1), unless the authorization is terminated or revoked.     Resp Syncytial Virus by PCR NEGATIVE NEGATIVE Final    Comment: (NOTE) Fact Sheet for Patients: bloggercourse.com  Fact Sheet for Healthcare Providers: seriousbroker.it  This test is not yet approved or cleared by the [place]  FDA and has been authorized for detection and/or diagnosis of SARS-CoV-2 by FDA under an Emergency Use Authorization (EUA). This EUA will remain in effect (meaning this test can be used) for  the duration of the COVID-19 declaration under Section 564(b)(1) of the Act, 21 U.S.C. section 360bbb-3(b)(1), unless the authorization is terminated or revoked.  Performed at [facility], [address], [place], [place] [address]      Radiology Studies: DG Abd 2 Views Result Date: 02/17/2024 CLINICAL DATA:  Small bowel obstruction. EXAM: ABDOMEN - 2 VIEW COMPARISON:  02/16/2024 FINDINGS: Nasogastric tube has tip just right of midline in the mid abdomen likely over the distal stomach. Air and contrast are present throughout the colon as contrast is present over the rectum. There are a couple air-filled mildly dilated small bowel loops in the left abdomen without significant change. No free peritoneal air. Remainder of the exam is unchanged. IMPRESSION: Findings suggesting partial small bowel obstruction without significant change. Electronically Signed   By: Toribio Agreste M.D.   On: 02/17/2024 12:20   DG Abd Portable 1V-Small Bowel Obstruction Protocol-initial, 8 hr delay Result Date: 02/16/2024 CLINICAL DATA:  8 hour delay small bowel protocol EXAM: PORTABLE ABDOMEN - 1 VIEW COMPARISON:  02/16/2024 FINDINGS: Enteric tube tip overlies the distal stomach/gastroduodenal region. Dilute contrast within dilated small bowel in the left mid quadrant. Small bowel distension persists. Contrast is present in the right colon. Residual contrast in the bladder. IMPRESSION: Persistent small bowel distension with dilute contrast in dilated left mid quadrant small bowel, but with some contrast present in the right colon suggesting partial obstruction. Electronically Signed   By: Luke Bun M.D.   On: 02/16/2024 22:56   DG Abd 2 Views Result Date: 02/16/2024 CLINICAL DATA:  Bowel obstruction EXAM: ABDOMEN - 2 VIEW COMPARISON:  02/15/2024 FINDINGS: Enteric tube tip overlies the distal stomach. No gross free air beneath the diaphragm. Persistent dilatation of small bowel loops measuring up to 5.2 cm,  with fluid levels on upright exam. Excreted contrast in the bladder. Some colorectal gas is present. IMPRESSION: Persistent dilatation of small bowel loops with fluid levels on upright exam, consistent with small bowel obstruction. Electronically Signed   By: Luke Bun M.D.   On: 02/16/2024 22:53    Scheduled Meds:  arformoterol   15 mcg Nebulization BID   And   umeclidinium bromide   1 puff Inhalation Daily   heparin  injection (subcutaneous)  5,000 Units Subcutaneous Q12H   levothyroxine   75 mcg Per Tube Q0600   ranolazine   500 mg Oral BID   Continuous Infusions:     LOS: 3 days  MDM: Patient is high risk for one or more organ failure.  They necessitate ongoing hospitalization for continued IV therapies and subsequent lab monitoring. Total time spent interpreting labs and vitals, reviewing the medical record, coordinating care amongst consultants and care team members, directly assessing and discussing care with the patient and/or family: 55 min  Shaima Sardinas, DO [facility]  To contact the attending physician between 7A-7P  please use Epic Chat. To contact the covering physician during after hours 7P-7A, please review Amion.  02/17/2024, 1:00 PM   *This document has been created with the assistance of dictation software. Please excuse typographical errors. *   "

## 2024-02-18 DIAGNOSIS — K56609 Unspecified intestinal obstruction, unspecified as to partial versus complete obstruction: Secondary | ICD-10-CM | POA: Diagnosis not present

## 2024-02-18 MED ORDER — ONDANSETRON HCL 4 MG PO TABS
4.0000 mg | ORAL_TABLET | Freq: Four times a day (QID) | ORAL | Status: DC | PRN
  Administered 2024-02-24: 4 mg via ORAL
  Filled 2024-02-18: qty 1

## 2024-02-18 MED ORDER — ENOXAPARIN SODIUM 40 MG/0.4ML IJ SOSY: 40.0000 mg | PREFILLED_SYRINGE | INTRAMUSCULAR | Status: DC

## 2024-02-18 MED ORDER — ACETAMINOPHEN 325 MG PO TABS: 650.0000 mg | ORAL_TABLET | Freq: Four times a day (QID) | ORAL | Status: DC | PRN

## 2024-02-18 MED ORDER — TRAZODONE HCL 50 MG PO TABS
25.0000 mg | ORAL_TABLET | Freq: Every evening | ORAL | Status: DC | PRN
  Administered 2024-02-19 – 2024-02-21 (×3): 25 mg via ORAL
  Filled 2024-02-18 (×3): qty 1

## 2024-02-18 MED ORDER — HYDROCODONE-ACETAMINOPHEN 5-325 MG PO TABS
1.0000 | ORAL_TABLET | ORAL | Status: DC | PRN
  Administered 2024-02-18 – 2024-02-20 (×4): 1 via ORAL
  Filled 2024-02-18 (×5): qty 1

## 2024-02-18 MED ORDER — ONDANSETRON HCL 4 MG/2ML IJ SOLN
4.0000 mg | Freq: Four times a day (QID) | INTRAMUSCULAR | Status: DC | PRN
  Administered 2024-02-18 – 2024-02-26 (×5): 4 mg via INTRAVENOUS
  Filled 2024-02-18 (×4): qty 2

## 2024-02-18 MED ORDER — ACETAMINOPHEN 650 MG RE SUPP: 650.0000 mg | Freq: Four times a day (QID) | RECTAL | Status: DC | PRN

## 2024-02-18 MED ORDER — MORPHINE SULFATE (PF) 2 MG/ML IV SOLN
2.0000 mg | INTRAVENOUS | Status: DC | PRN
  Administered 2024-02-18: 2 mg via INTRAVENOUS

## 2024-02-18 MED ORDER — ENOXAPARIN SODIUM 40 MG/0.4ML IJ SOSY
40.0000 mg | PREFILLED_SYRINGE | INTRAMUSCULAR | Status: AC
  Administered 2024-02-19 – 2024-02-23 (×4): 40 mg via SUBCUTANEOUS
  Filled 2024-02-18 (×4): qty 0.4

## 2024-02-18 MED ORDER — POLYETHYLENE GLYCOL 3350 17 G PO PACK
17.0000 g | PACK | Freq: Every day | ORAL | Status: DC
  Administered 2024-02-18 – 2024-02-19 (×2): 17 g via ORAL
  Filled 2024-02-18 (×3): qty 1

## 2024-02-18 MED ORDER — ACETAMINOPHEN 325 MG PO TABS
650.0000 mg | ORAL_TABLET | Freq: Four times a day (QID) | ORAL | Status: DC | PRN
  Filled 2024-02-18: qty 2

## 2024-02-18 MED ORDER — LEVOTHYROXINE SODIUM 50 MCG PO TABS
75.0000 ug | ORAL_TABLET | Freq: Every day | ORAL | Status: DC
  Administered 2024-02-19 – 2024-02-27 (×8): 75 ug via ORAL
  Filled 2024-02-18 (×9): qty 1

## 2024-02-18 NOTE — Plan of Care (Signed)

## 2024-02-18 NOTE — Progress Notes (Signed)
 [facility] SURGICAL PROGRESS NOTE (cpt [id number])  Hospital Day(s): 4.   Interval History: Patient seen and examined, no acute events or new complaints overnight. Patient reports he is having some nausea this AM. Abdomen is sore but he has reported this daily and no worsening. He is mild distended, some burping. No emesis. He does report continued flatus and passing of BM. He did pass NGT clamp trial yesterday with 5 ccs residual. No new labs this AM. No new images.   Review of Systems:  Constitutional: denies fever, chills  HEENT: denies cough or congestion  Respiratory: denies any shortness of breath  Cardiovascular: denies chest pain or palpitations  Gastrointestinal: + nausea; denied abdominal pain, emesis Genitourinary: denies burning with urination or urinary frequency Musculoskeletal: denies pain, decreased motor or sensation  Vital signs in last 24 hours: [min-max] current  Temp:  [97.9 F (36.6 C)-99.3 F (37.4 C)] 99.3 F (37.4 C) (01/14 0737) Pulse Rate:  [87-98] 87 (01/14 0737) Resp:  [17-18] 18 (01/14 0737) BP: (126-142)/(63-74) 131/63 (01/14 0737) SpO2:  [92 %-94 %] 94 % (01/14 0747)     Height: 5' 10 (177.8 cm) Weight: 67.6 kg BMI (Calculated): 21.38   Intake/Output last 2 shifts:  01/13 0701 - 01/14 0700 In: 200 [P.O.:200] Out: 5 [Emesis/NG output:5]   Physical Exam:  Constitutional: alert, cooperative and no distress  HENT: normocephalic without obvious abnormality Eyes: PERRL, EOM's grossly intact and symmetric  Respiratory: breathing non-labored at rest  Cardiovascular: regular rate and sinus rhythm  Gastrointestinal: soft, non-tender, still distended. No rebound/guarding.   Labs:     Latest Ref Rng & Units 02/17/2024   10:34 PM 02/16/2024    9:24 AM 02/15/2024    6:11 AM  CBC  WBC 4.0 - 10.5 K/uL 9.3  10.2  9.3   Hemoglobin 13.0 - 17.0 g/dL 86.9  85.9  86.3   Hematocrit 39.0 - 52.0 % 38.8  41.8  39.8   Platelets 150 - 400 K/uL 204  221   222       Latest Ref Rng & Units 02/17/2024   10:34 PM 02/16/2024    9:24 AM 02/15/2024    6:11 AM  CMP  Glucose 70 - 99 mg/dL 98  89  886   BUN 8 - 23 mg/dL 12  15  20    Creatinine 0.61 - 1.24 mg/dL 9.16  9.04  8.70   Sodium 135 - 145 mmol/L 139  140  137   Potassium 3.5 - 5.1 mmol/L 4.1  4.7  4.7   Chloride 98 - 111 mmol/L 101  103  102   CO2 22 - 32 mmol/L 30  26  25    Calcium  8.9 - 10.3 mg/dL 8.7  8.5  8.8   Total Protein 6.5 - 8.1 g/dL 6.1  6.3    Total Bilirubin 0.0 - 1.2 mg/dL 1.5  1.6    Alkaline Phos 38 - 126 U/L 44  49    AST 15 - 41 U/L 26  18    ALT 0 - 44 U/L 18  11       Imaging studies:  No new imaging studies   Assessment/Plan:  84 y.o. male with small bowel obstruction   - He is having some nausea this morning; however, continues to have flatus and BM and abdominal examination is not worse this AM which is encouraging. He did also pass gastrografin  challenge and NGT clamp trial previously this admission. I think he may be in  a grey area right now. Encouraged him to take it easy with CLD today and monitor symptoms. Low threshold to return to NPO if needed.   - We can hold off on NGT replacement  unless emesis recurs.   - No need for emergent surgical interventions. He understands if he deteriorates, or fails to improve, we may need to consider this.   - Monitor abdominal examination; on-going bowel function    - Serial KUB as needed  - Pain control prn; antiemetics prn - Continue to mobilize  - Further management per primary service; we will follow    All of the above findings and recommendations were discussed with the patient,, and the medical team, and all of patient's questions were answered to his expressed satisfaction.  -- Arthea Platt, PA-C [facility] 02/18/2024, 9:16 AM M-F: 7am - 4pm

## 2024-02-18 NOTE — Progress Notes (Signed)
" °  PROGRESS NOTE    Lonnie Schaefer  FMW:[id number] DOB: December 23, 1940 DOA: 02/14/2024 PCP: Samie Frederick, PA-C  146A/[id number]-AA  LOS: 4 days   Brief hospital course:   Assessment & Plan: Lonnie Schaefer is an 84 year old male with COPD, CAD, inguinal hernia s/p repair and prior SBO, who presented to the emergency department with acute onset recurrent nausea and vomiting as well as diarrhea and abdominal pain.  Labs consistent with mild AKI, leukocytosis of 11.5.  CT abdomen pelvis with SBO, transition point in the distal jejunum.  Also revealed small fat-containing supraumbilical midline ventral hernia and bilateral inguinal hernias without bowel herniation.  Colonic diverticulosis without diverticulitis. Inguinal hernia was reduced by Dr. Viviann.  NG tube was inserted.  General surgery was consulted.  Patient has had gradual return of bowel function, even though his NGT output has been high.  NG tube clamped with minimal residual on 1/13, patient was started on liquids.   SBO - Had returned of bowel function.  Flatus and BM, however, continued to have nausea and abdominal pain. - General Surgery still following --cont clear liquid   COPD, not currently in exacerbation Nocturnal hypoxia on 2L O2 nightly --cont bronchodilators   CAD - Stable.     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  S/p radiation therapy - Following outpatient with ENT and oncology   Acquired hypothyroidism --cont Syndhroid   Osteoporosis - Continue outpatient follow-up   DVT prophylaxis: Lovenox  SQ Code Status: DNR  Family Communication: family updated at bedside today Level of care: Telemetry Dispo:   The patient is from: home Anticipated d/c is to: home Anticipated d/c date is: 2-3 days   Subjective and Interval History:  Pt reported continued nausea and abdominal pain.   Objective: Vitals:   02/18/24 0747 02/18/24 1639 02/18/24 1934 02/18/24  2113  BP:  122/81 94/78   Pulse:  99 85   Resp:  18 18   Temp:  98.2 F (36.8 C) 97.8 F (36.6 C)   TempSrc:      SpO2: 94% 91% 93% 92%  Weight:      Height:        Intake/Output Summary (Last 24 hours) at 02/18/2024 2241 Last data filed at 02/18/2024 1300 Gross per 24 hour  Intake 480 ml  Output --  Net 480 ml   Filed Weights   02/14/24 1826 02/15/24 0642  Weight: 69.8 kg 67.6 kg    Examination:   Constitutional: NAD, AAOx3 HEENT: conjunctivae and lids normal, EOMI CV: No cyanosis.   RESP: normal respiratory effort, on 2L Extremities: No effusions, edema in BLE SKIN: warm, dry Neuro: II - XII grossly intact.   Psych: Normal mood and affect.  Appropriate judgement and reason   Data Reviewed: I have personally reviewed labs and imaging studies  Time spent: 50 minutes  Ellouise Haber, MD [facility] If 7PM-7AM, please contact night-coverage 02/18/2024, 10:41 PM   "

## 2024-02-18 NOTE — Progress Notes (Signed)
 Mobility Specialist Progress Note:    02/18/24 1232  Mobility  Activity Stood with assistance;Pivoted/transferred from chair to bed  Level of Assistance Contact guard assist, steadying assist  Assistive Device Front wheel walker  Range of Motion/Exercises Active;All extremities  Activity Response Tolerated well  Mobility visit 1 Mobility  Mobility Specialist Start Time (ACUTE ONLY) 1203  Mobility Specialist Stop Time (ACUTE ONLY) 1214  Mobility Specialist Time Calculation (min) (ACUTE ONLY) 11 min   Pt received in chair, not agreeable to ambulation at this time d/t fatigue but requests assistance to bed. Required CGA to stand and transfer with RW. Tolerated well, will re attempt further mobility at a different time. Alarm on and belongings in reach, all needs met.  Sherrilee Ditty Mobility Specialist Please contact via Special Educational Needs Teacher or  Rehab office at [phone]

## 2024-02-19 ENCOUNTER — Inpatient Hospital Stay

## 2024-02-19 DIAGNOSIS — K56609 Unspecified intestinal obstruction, unspecified as to partial versus complete obstruction: Secondary | ICD-10-CM | POA: Diagnosis not present

## 2024-02-19 LAB — MAGNESIUM: Magnesium: 2.2 mg/dL (ref 1.7–2.4)

## 2024-02-19 LAB — BASIC METABOLIC PANEL WITH GFR
Anion gap: 9 (ref 5–15)
BUN: 19 mg/dL (ref 8–23)
CO2: 30 mmol/L (ref 22–32)
Calcium: 8.7 mg/dL — ABNORMAL LOW (ref 8.9–10.3)
Chloride: 102 mmol/L (ref 98–111)
Creatinine, Ser: 0.95 mg/dL (ref 0.61–1.24)
GFR, Estimated: >60 mL/min
Glucose, Bld: 98 mg/dL (ref 70–99)
Potassium: 4.4 mmol/L (ref 3.5–5.1)
Sodium: 141 mmol/L (ref 135–145)

## 2024-02-19 LAB — PHOSPHORUS: Phosphorus: 2.7 mg/dL (ref 2.5–4.6)

## 2024-02-19 MED ORDER — HYDROCODONE-ACETAMINOPHEN 5-325 MG PO TABS
1.0000 | ORAL_TABLET | Freq: Once | ORAL | Status: AC
  Administered 2024-02-19: 1 via ORAL
  Filled 2024-02-19: qty 1

## 2024-02-19 MED ORDER — METOCLOPRAMIDE HCL 5 MG/ML IJ SOLN
5.0000 mg | Freq: Once | INTRAMUSCULAR | Status: AC
  Administered 2024-02-19: 5 mg via INTRAVENOUS
  Filled 2024-02-19: qty 2

## 2024-02-19 NOTE — Progress Notes (Addendum)
 Pt is complaining of pain 7/10 of abdominal pain and nausea. PRN zofran  administer at 1800 and norco at 1937; NP Donati-Garmon made aware.  Update 2213 and 2214: See new orders.

## 2024-02-19 NOTE — Progress Notes (Signed)
 Physical Therapy Treatment Patient Details Name: Lonnie Schaefer MRN: [id number] DOB: 07/30/1940 Today's Date: 02/19/2024   History of Present Illness Pt is an 84 yo male that presented to ED for SBO, NG tube placed. PMH of  COPD, CAD, inguinal hernia s/p repair and prior SBO, carcinoma of junction of hard/soft palate.    PT Comments  Patient received in bed, he is pleasant and agrees to PT session. Patient is independent with bed mobility and transfers. Ambulated 175 feet without AD and supervision. No lob, appears to be at baseline level of mobility. Some fatigue with this, and will continue to benefit from ambulation with mobility or nursing staff. PT signing off.      If plan is discharge home, recommend the following:  N/A   Can travel by private vehicle      yes  Equipment Recommendations  None recommended by PT    Recommendations for Other Services       Precautions / Restrictions Precautions Precaution/Restrictions Comments: mod fall Restrictions Weight Bearing Restrictions Per Provider Order: No     Mobility  Bed Mobility Overal bed mobility: Independent                  Transfers Overall transfer level: Independent Equipment used: None Transfers: Sit to/from Stand Sit to Stand: Supervision                Ambulation/Gait Ambulation/Gait assistance: Supervision Gait Distance (Feet): 175 Feet Assistive device: None Gait Pattern/deviations: Step-through pattern Gait velocity: WNL     General Gait Details: patient ambulated without AD, no LOB or difficulty noted. Appears to be at baseline   Optometrist     Tilt Bed    Modified Rankin (Stroke Patients Only)       Balance Overall balance assessment: Mild deficits observed, not formally tested Sitting-balance support: Feet supported Sitting balance-Leahy Scale: Normal     Standing balance support: During functional activity, No upper extremity  supported Standing balance-Leahy Scale: Good                              Communication Communication Communication: No apparent difficulties  Cognition Arousal: Alert Behavior During Therapy: WFL for tasks assessed/performed   PT - Cognitive impairments: No apparent impairments                         Following commands: Intact      Cueing Cueing Techniques: Verbal cues  Exercises      General Comments        Pertinent Vitals/Pain Pain Assessment Pain Assessment: No/denies pain    Home Living                          Prior Function            PT Goals (current goals can now be found in the care plan section) Acute Rehab PT Goals Patient Stated Goal: to go home PT Goal Formulation: With patient Time For Goal Achievement: 03/02/24 Potential to Achieve Goals: Good Progress towards PT goals: Goals met/education completed, patient discharged from PT    Frequency           PT Plan      Co-evaluation              AM-PAC  PT 6 Clicks Mobility   Outcome Measure  Help needed turning from your back to your side while in a flat bed without using bedrails?: None Help needed moving from lying on your back to sitting on the side of a flat bed without using bedrails?: None Help needed moving to and from a bed to a chair (including a wheelchair)?: None Help needed standing up from a chair using your arms (e.g., wheelchair or bedside chair)?: None Help needed to walk in hospital room?: None Help needed climbing 3-5 steps with a railing? : None 6 Click Score: 24    End of Session Equipment Utilized During Treatment: Oxygen  Activity Tolerance: Patient tolerated treatment well Patient left: in chair;with call bell/phone within reach;with chair alarm set Nurse Communication: Mobility status       Time: 1150-1206 PT Time Calculation (min) (ACUTE ONLY): 16 min  Charges:    $Gait Training: 8-22 mins PT General Charges $$  ACUTE PT VISIT: 1 Visit                     Bronwyn Belasco, PT, GCS 02/19/24,12:48 PM

## 2024-02-19 NOTE — TOC Progression Note (Signed)
 Transition of Care [facility]) - Progression Note    Patient Details  Name: Lonnie Schaefer MRN: [id number] Date of Birth: May 20, 1940  Transition of Care [facility]) CM/SW Contact  Alvaro Louder, [place] Phone Number: 02/19/2024, 12:14 PM  Clinical Narrative:   Patient selected HH company [facility] they will follow at DC.   TOC to follow for discharge                      Expected Discharge Plan and Services                                               Social Drivers of Health (SDOH) Interventions SDOH Screenings   Food Insecurity: No Food Insecurity (02/15/2024)  Housing: Low Risk (02/15/2024)  Transportation Needs: No Transportation Needs (02/15/2024)  Utilities: Not At Risk (02/15/2024)  Financial Resource Strain: Low Risk  (12/09/2023)   Received from [facility]  Physical Activity: Insufficiently Active (05/20/2023)   Received from [facility]  Social Connections: Moderately Isolated (02/15/2024)  Stress: No Stress Concern Present (05/20/2023)   Received from [facility]  Tobacco Use: Medium Risk (02/14/2024)    Readmission Risk Interventions     No data to display

## 2024-02-19 NOTE — Plan of Care (Signed)

## 2024-02-19 NOTE — TOC Initial Note (Signed)
 Transition of Care [facility]) - Initial/Assessment Note    Patient Details  Name: Lonnie Schaefer MRN: [id number] Date of Birth: 27-Oct-1940  Transition of Care [facility]) CM/SW Contact:    Alvaro Louder, LCSW Phone Number: 02/19/2024, 4:24 PM  Clinical Narrative:     Per chart review patient is from Home. PCP is Tylene Meres LCSWA faxed out patient to [facility] in [place]. TOC to present [facility] facilities to patient at the bedside.             TOC to follow for discharge       Patient Goals and CMS Choice            Expected Discharge Plan and Services                                              Prior Living Arrangements/Services                       Activities of Daily Living   ADL Screening (condition at time of admission) Independently performs ADLs?: Yes (appropriate for developmental age) Is the patient deaf or have difficulty hearing?: No Does the patient have difficulty seeing, even when wearing glasses/contacts?: No Does the patient have difficulty concentrating, remembering, or making decisions?: No  Permission Sought/Granted                  Emotional Assessment              Admission diagnosis:  Small bowel obstruction (HCC) [K56.609] SBO (small bowel obstruction) (HCC) [K56.609] Right inguinal hernia [K40.90] Nausea vomiting and diarrhea [R11.2, R19.7] Patient Active Problem List   Diagnosis Date Noted   Small bowel obstruction (HCC) 02/15/2024   Chronic obstructive pulmonary disease (COPD) (HCC) 02/15/2024   Coronary artery disease 02/15/2024   Hypothyroidism 02/15/2024   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/26/2019   Cancer of junction of hard and soft palate (HCC) 10/26/2019   Supplemental oxygen  dependent 08/09/2019   Lactic acidosis 08/05/2019   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HCC) 08/05/2019   Acute abdominal pain    Acute cholecystitis 09/05/2016   Leukocytosis 04/03/2016   SBO (small bowel obstruction) (HCC)  04/03/2016   Hyperbilirubinemia 04/03/2016   AKI (acute kidney injury) 04/03/2016   Nausea vomiting and diarrhea 10/01/2013   COPD (chronic obstructive pulmonary disease) (HCC) 02/23/2007   PCP:  Meres Tylene, PA-C Pharmacy:   [id number] [place], [place] - [address] AT [address] [place] [address] Phone: [phone] Fax: [phone]     Social Drivers of Health (SDOH) Social History: SDOH Screenings   Food Insecurity: No Food Insecurity (02/15/2024)  Housing: Low Risk (02/15/2024)  Transportation Needs: No Transportation Needs (02/15/2024)  Utilities: Not At Risk (02/15/2024)  Financial Resource Strain: Low Risk  (12/09/2023)   Received from [facility]  Physical Activity: Insufficiently Active (05/20/2023)   Received [facility]  Social Connections: Moderately Isolated (02/15/2024)  Stress: No Stress Concern Present (05/20/2023)   Received from [facility]  Tobacco Use: Medium Risk (02/14/2024)   SDOH Interventions: Social Connections Interventions: Inpatient TOC, Other (Comment) (Resources added to AVS)   Readmission Risk Interventions     No data to display

## 2024-02-19 NOTE — Progress Notes (Signed)
 [facility] SURGICAL PROGRESS NOTE (cpt [phone])  Hospital Day(s): 5.   Interval History: Patient seen and examined, no acute events or new complaints overnight. Patient reports he feels much better this morning. His previous soreness is resolving. No longer with nausea. No emesis. No fever, chills. Labs this morning are reassuring. KUB this morning still with contrast in the colon; however, small bowel remains dilated without significant improvement.   Review of Systems:  Constitutional: denies fever, chills  HEENT: denies cough or congestion  Respiratory: denies any shortness of breath  Cardiovascular: denies chest pain or palpitations  Gastrointestinal: denied abdominal pain, nausea, emesis Genitourinary: denies burning with urination or urinary frequency Musculoskeletal: denies pain, decreased motor or sensation  Vital signs in last 24 hours: [min-max] current  Temp:  [97.8 F (36.6 C)-99.3 F (37.4 C)] 98.5 F (36.9 C) (01/15 0443) Pulse Rate:  [73-99] 73 (01/15 0443) Resp:  [18] 18 (01/15 0443) BP: (94-131)/(63-81) 117/63 (01/15 0443) SpO2:  [91 %-94 %] 93 % (01/15 0443)     Height: 5' 10 (177.8 cm) Weight: 67.6 kg BMI (Calculated): 21.38   Intake/Output last 2 shifts:  01/14 0701 - 01/15 0700 In: 480 [P.O.:480] Out: -    Physical Exam:  Constitutional: alert, cooperative and no distress  HENT: normocephalic without obvious abnormality Eyes: PERRL, EOM's grossly intact and symmetric  Respiratory: breathing non-labored at rest  Cardiovascular: regular rate and sinus rhythm  Gastrointestinal: soft, non-tender, still distended but improved. No rebound/guarding.   Labs:     Latest Ref Rng & Units 02/17/2024   10:34 PM 02/16/2024    9:24 AM 02/15/2024    6:11 AM  CBC  WBC 4.0 - 10.5 K/uL 9.3  10.2  9.3   Hemoglobin 13.0 - 17.0 g/dL 86.9  85.9  86.3   Hematocrit 39.0 - 52.0 % 38.8  41.8  39.8   Platelets 150 - 400 K/uL 204  221  222       Latest Ref  Rng & Units 02/19/2024    5:15 AM 02/17/2024   10:34 PM 02/16/2024    9:24 AM  CMP  Glucose 70 - 99 mg/dL 98  98  89   BUN 8 - 23 mg/dL 19  12  15    Creatinine 0.61 - 1.24 mg/dL 9.04  9.16  9.04   Sodium 135 - 145 mmol/L 141  139  140   Potassium 3.5 - 5.1 mmol/L 4.4  4.1  4.7   Chloride 98 - 111 mmol/L 102  101  103   CO2 22 - 32 mmol/L 30  30  26    Calcium  8.9 - 10.3 mg/dL 8.7  8.7  8.5   Total Protein 6.5 - 8.1 g/dL  6.1  6.3   Total Bilirubin 0.0 - 1.2 mg/dL  1.5  1.6   Alkaline Phos 38 - 126 U/L  44  49   AST 15 - 41 U/L  26  18   ALT 0 - 44 U/L  18  11      Imaging studies:   KUB (02/19/2024) personally reviewed with continued small bowel dilation, no significant improvement, there remains contrast/stool in colon, and radiologist report reviewed:  IMPRESSION: Evidence of known partial small bowel obstruction with interval increased number of dilated small bowel loops.    Assessment/Plan:  84 y.o. male with small bowel obstruction, clinically improving   - Although KUB with some increase in distension, he clinically looks much better this AM with continued bowel function.  We will cautiously trial FLD.   - We can hold off on NGT replacement unless emesis recurs.   - No need for emergent surgical interventions.  He is certainly not out of the woods just yet. He understands if he deteriorates, or fails to improve, we may need to consider this.   - Monitor abdominal examination; on-going bowel function    - Serial KUB as needed  - Pain control prn; antiemetics prn - Continue to mobilize  - Further management per primary service; we will follow    All of the above findings and recommendations were discussed with the patient,, and the medical team, and all of patient's questions were answered to his expressed satisfaction.  -- Arthea Platt, PA-C [facility] 02/19/2024, 7:32 AM M-F: 7am - 4pm

## 2024-02-19 NOTE — Progress Notes (Signed)
" °  PROGRESS NOTE    Lonnie Schaefer  FMW:[id number] DOB: 01-29-1941 DOA: 02/14/2024 PCP: Samie Frederick, PA-C  [id number]  LOS: 5 days   Brief hospital course:   Assessment & Plan: Lonnie Schaefer is an 84 year old male with COPD, CAD, inguinal hernia s/p repair and prior SBO, who presented to the emergency department with acute onset recurrent nausea and vomiting as well as diarrhea and abdominal pain.  Labs consistent with mild AKI, leukocytosis of 11.5.  CT abdomen pelvis with SBO, transition point in the distal jejunum.  Also revealed small fat-containing supraumbilical midline ventral hernia and bilateral inguinal hernias without bowel herniation.  Colonic diverticulosis without diverticulitis. Inguinal hernia was reduced by Dr. Viviann.  NG tube was inserted.  General surgery was consulted.  Patient has had gradual return of bowel function, even though his NGT output has been high.  NG tube clamped with minimal residual on 1/13, patient was started on liquids.   SBO - Had returned of bowel function.  Flatus and BM, however, continued to have nausea and abdominal pain. - General Surgery following --advance to full liquid --management per GenSurg   COPD, not currently in exacerbation Nocturnal hypoxia on 2L O2 nightly --cont bronchodilators   CAD - Stable.     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  S/p radiation therapy - Following outpatient with ENT and oncology   Acquired hypothyroidism --cont Synthroid    Osteoporosis - Continue outpatient follow-up   DVT prophylaxis: Lovenox  SQ Code Status: DNR  Family Communication:  Level of care: Telemetry Dispo:   The patient is from: home Anticipated d/c is to: home Anticipated d/c date is: to be determined   Subjective and Interval History:  Diet advanced to Full liquid today, however, pt reported not tolerating it, and had nausea.   Objective: Vitals:   02/19/24  0741 02/19/24 1632 02/19/24 1936 02/19/24 2138  BP: 107/69 136/63 (!) 145/70   Pulse: 81 83 87   Resp: 17 18 16    Temp: 98.3 F (36.8 C) 98.4 F (36.9 C) 98.3 F (36.8 C)   TempSrc:      SpO2: (!) 88% 94% 91% 93%  Weight:      Height:        Intake/Output Summary (Last 24 hours) at 02/19/2024 2228 Last data filed at 02/19/2024 1918 Gross per 24 hour  Intake 480 ml  Output --  Net 480 ml   Filed Weights   02/14/24 1826 02/15/24 0642  Weight: 69.8 kg 67.6 kg    Examination:   Constitutional: NAD, AAOx3 HEENT: conjunctivae and lids normal, EOMI CV: No cyanosis.   RESP: normal respiratory effort, on RA Neuro: II - XII grossly intact.     Data Reviewed: I have personally reviewed labs and imaging studies  Time spent: 35 minutes  Ellouise Haber, MD [facility] If 7PM-7AM, please contact night-coverage 02/19/2024, 10:28 PM   "

## 2024-02-20 ENCOUNTER — Other Ambulatory Visit: Payer: Self-pay

## 2024-02-20 ENCOUNTER — Inpatient Hospital Stay

## 2024-02-20 LAB — GLUCOSE, CAPILLARY
Glucose-Capillary: 102 mg/dL — ABNORMAL HIGH (ref 70–99)
Glucose-Capillary: 94 mg/dL (ref 70–99)

## 2024-02-20 LAB — COMPREHENSIVE METABOLIC PANEL WITH GFR
ALT: 35 U/L (ref 0–44)
AST: 24 U/L (ref 15–41)
Albumin: 3.4 g/dL — ABNORMAL LOW (ref 3.5–5.0)
Alkaline Phosphatase: 44 U/L (ref 38–126)
Anion gap: 10 (ref 5–15)
BUN: 18 mg/dL (ref 8–23)
CO2: 26 mmol/L (ref 22–32)
Calcium: 8.5 mg/dL — ABNORMAL LOW (ref 8.9–10.3)
Chloride: 103 mmol/L (ref 98–111)
Creatinine, Ser: 0.88 mg/dL (ref 0.61–1.24)
GFR, Estimated: >60 mL/min
Glucose, Bld: 93 mg/dL (ref 70–99)
Potassium: 4 mmol/L (ref 3.5–5.1)
Sodium: 140 mmol/L (ref 135–145)
Total Bilirubin: 0.9 mg/dL (ref 0.0–1.2)
Total Protein: 5.8 g/dL — ABNORMAL LOW (ref 6.5–8.1)

## 2024-02-20 LAB — MAGNESIUM: Magnesium: 2.1 mg/dL (ref 1.7–2.4)

## 2024-02-20 LAB — PHOSPHORUS: Phosphorus: 2.8 mg/dL (ref 2.5–4.6)

## 2024-02-20 MED ORDER — IOHEXOL 300 MG/ML  SOLN
100.0000 mL | Freq: Once | INTRAMUSCULAR | Status: AC | PRN
  Administered 2024-02-20: 100 mL via INTRAVENOUS

## 2024-02-20 MED ORDER — INSULIN ASPART 100 UNIT/ML IJ SOLN
0.0000 [IU] | Freq: Three times a day (TID) | INTRAMUSCULAR | Status: DC
  Administered 2024-02-21 – 2024-02-22 (×2): 1 [IU] via SUBCUTANEOUS
  Filled 2024-02-20 (×2): qty 1

## 2024-02-20 MED ORDER — SODIUM CHLORIDE 0.9% FLUSH
10.0000 mL | INTRAVENOUS | Status: DC | PRN
  Administered 2024-02-21: 20 mL

## 2024-02-20 MED ORDER — SODIUM CHLORIDE 0.9% FLUSH
10.0000 mL | Freq: Two times a day (BID) | INTRAVENOUS | Status: DC
  Administered 2024-02-20 – 2024-02-25 (×11): 10 mL
  Administered 2024-02-26: 20 mL
  Administered 2024-02-26 – 2024-02-27 (×2): 10 mL

## 2024-02-20 MED ORDER — THIAMINE HCL 100 MG/ML IJ SOLN
100.0000 mg | INTRAMUSCULAR | Status: AC
  Administered 2024-02-20 – 2024-02-26 (×7): 100 mg via INTRAVENOUS
  Filled 2024-02-20 (×7): qty 2

## 2024-02-20 MED ORDER — CEFAZOLIN SODIUM-DEXTROSE 2-4 GM/100ML-% IV SOLN: 2.0000 g | INTRAVENOUS | Status: DC

## 2024-02-20 MED ORDER — CHLORHEXIDINE GLUCONATE CLOTH 2 % EX PADS
6.0000 | MEDICATED_PAD | Freq: Every day | CUTANEOUS | Status: DC
  Administered 2024-02-20 – 2024-02-27 (×8): 6 via TOPICAL

## 2024-02-20 MED ORDER — SODIUM CHLORIDE 0.9 % IV SOLN
2.0000 g | Freq: Once | INTRAVENOUS | Status: AC
  Administered 2024-02-21: 2 g via INTRAVENOUS
  Filled 2024-02-20: qty 2

## 2024-02-20 MED ORDER — TRAVASOL 10 % IV SOLN
INTRAVENOUS | Status: AC
  Filled 2024-02-20: qty 374.4

## 2024-02-20 NOTE — Consult Note (Addendum)
 PHARMACY - TOTAL PARENTERAL NUTRITION CONSULT NOTE   Indication: Small bowel obstruction  Patient Measurements: Height: 5' 10 (177.8 cm) Weight: 67.6 kg (149 lb) IBW/kg (Calculated) : 73 TPN AdjBW (KG): 67.6 Body mass index is 21.38 kg/m.   Assessment:  84 yo male presented on 02/14/24 to ED with complaint of abdominal pain and constipation.  Imaging was concerning for SBO.  Patient had NGT placed for decompression.  Patient had had some improvement and was started on diet, however symptoms have persisted and patient not wanting to eat.  Per surgery note current plan is for patient to be NPO again and initiate TPN.  Repeat CT imaging ordered by surgery, surgical intervention may be needed.  Glucose / Insulin : No history of diabetes.  Will order sensitive SSI with q8 hours CBG monitoring with initiation of TPN. Electrolytes: Electolytes WNL Renal: Scr < 1 Hepatic: LFTs WNL Intake / Output; MIVF:   Intake/Output Summary (Last 24 hours) at 02/20/2024 9062 Last data filed at 02/20/2024 0326 Gross per 24 hour  Intake 480 ml  Output 100 ml  Net 380 ml    GI Imaging: 1/15 KUB: Evidence of known partial small bowel obstruction with interval increased number of dilated small bowel loops. GI Surgeries / Procedures:  No surgical procedure at this time Central access: Order in for PICC placement TPN start date: 02/20/24  Nutritional Goals: Goal TPN rate is 80 mL/hr (provides 99.8 g of protein and 1858 kcals per day)  RD Assessment: Estimated Needs Total Energy Estimated Needs: 1800-2000 Total Protein Estimated Needs: 90-105 grams Total Fluid Estimated Needs: 1.8-2.0 L  Current Nutrition:  NPO  Plan:  Start TPN at 30 mL/hr at 1800 (approximately 1/3 rate due to high refeeding risk) Electrolytes in TPN: Na 46mEq/L, K 50mEq/L, Ca 65mEq/L, Mg 77mEq/L, and Phos 15mmol/L. Cl:Ac 1:1 Add standard MVI and trace elements to TPN Initiate Sensitive q8h SSI and adjust as needed  No MIVF at  this time. Thiamine  100 mg IV daily x 7 days per dietary recommendations Monitor TPN labs on Mon/Thurs, will initially monitor daily labs due to high refeeding risk  Kayla JULIANNA Blew, PharmD, BCPS 02/20/2024,9:34 AM

## 2024-02-20 NOTE — Progress Notes (Signed)
 Initial Nutrition Assessment  DOCUMENTATION CODES:   Severe malnutrition in context of chronic illness  INTERVENTION:   -TPN management per pharmacy -Recommend MVI daily -Recommend 100 mg thiamine  daily x 7 days -Recommend monitor Mg, K, and Phos and replete as needed  -RD will follow for diet advancement and add supplements as appropriate   NUTRITION DIAGNOSIS:   Severe Malnutrition related to chronic illness (COPD) as evidenced by severe fat depletion, severe muscle depletion.  GOAL:   Patient will meet greater than or equal to 90% of their needs  MONITOR:   Diet advancement  REASON FOR ASSESSMENT:   Consult New TPN/TNA  ASSESSMENT:   67 rear old male with medical history significant for COPD, coronary artery disease, inguinal hernia s/p repair and SBO, who presented to the emergency room with acute onset of recurrent nausea and vomiting with associated diarrhea and abdominal pain over the last couple of days PTA.  No bilious vomitus or hematemesis.  No melena or bright red bleeding per rectum.  He had subsequent significant diminished p.o. intake.  Patient admitted with SBO.   1/11- NGT placed for decompression 1/16- PICC placed, TPN 1/12- gastrograffin challenge 1/13- clamping trial, advanced to clear liquids, NGT removed 1/14- successful gastrograffin challenge 1/15- advanced to full liquid diet 1/16- KUB reveals more dilated loops of small bowel, NPO  Reviewed I/O's: +380 ml x 24 hours and +3.3 L since admission  UOP: 100 ml x 24 hours  Per general surgery notes, plan for ex lap tomorrow (02/21/24). He is having bowel movements, but complains of bloating and does not want to eat.   Spoke with patient at bedside, who was pleasant and in good spirits today. Patient shares that he has had good days and bad days since hospitalization, but feels good today due to good sleep and faith in the hospital staff. He expressed appreciation for the care he was receiving.    Patient reports that he has had a lot of health challenges and hospitalizations over the past 1-2 years. Patient shares that he has been diagnosed with COPD and is followed by a pulmonologist, but does his best to stay active (patient talked about his love for outdoors and spending his time sitting under the carport, people watching and feeding squirrels). He shares that he has had multiple admissions for a-fib and  SBO over the past year secondary to a hernia.   Patient reports he had a fair appetite PTA, but acknowledges that he has not been eating much lately due to SBO. Patient acknowledges understanding of NPO status and was able to teachback plan for PICC placement and TPN today. Discussed how this will assist with healing and help prevent further weight loss, muscle loss, and debility. Patient is hopeful that when he leaves the hospital, he can return home with home health, like he did during previous visits. He also has support from his two daughters.   Patient admits to weight loss, but unsure of UBW or how much he has lost. He suspects weight loss has occurred over the past 1-2 years due to hospitalizations. Reviewed weight history; weight has ranged from 65.8-696.9 kg over the past 3 months.   Case discussed with pharmacy, who confirmed plan for TPN. Discussed recommendations for nutritional goals and refeeding risk. Plan to initiate TPN at 1800 at 30 ml/hr, which provides 697 kcals and 36 grams protein, meeting 39% estimated kcal needs and 41% of estimated protein needs.   Medications reviewed and include lovenox   Labs  reviewed. Inpatient orders for glycemic control are 0-6 units insulin  aspart every 8 hours.   NUTRITION - FOCUSED PHYSICAL EXAM:  Flowsheet Row Most Recent Value  Orbital Region Severe depletion  Upper Arm Region Severe depletion  Thoracic and Lumbar Region Severe depletion  Buccal Region Severe depletion  Temple Region Severe depletion  Clavicle Bone Region Severe  depletion  Clavicle and Acromion Bone Region Severe depletion  Scapular Bone Region Severe depletion  Dorsal Hand Severe depletion  Patellar Region Severe depletion  Anterior Thigh Region Severe depletion  Posterior Calf Region Severe depletion  Edema (RD Assessment) None  Hair Reviewed  Eyes Reviewed  Mouth Reviewed  Skin Reviewed  Nails Reviewed    Diet Order:   Diet Order             Diet NPO time specified Except for: Sips with Meds  Diet effective now                   EDUCATION NEEDS:   Education needs have been addressed  Skin:  Skin Assessment: Reviewed RN Assessment  Last BM:  02/20/24 (type 5)  Height:   Ht Readings from Last 1 Encounters:  02/15/24 5' 10 (1.778 m)    Weight:   Wt Readings from Last 1 Encounters:  02/15/24 67.6 kg    Ideal Body Weight:  75.5 kg  BMI:  Body mass index is 21.38 kg/m.  Estimated Nutritional Needs:   Kcal:  1800-2000  Protein:  90-105 grams  Fluid:  1.8-2.0 L    Margery ORN, RD, LDN, CDCES Registered Dietitian III Certified Diabetes Care and Education Specialist If unable to reach this RD, please use RD Inpatient group chat on secure chat between hours of 8am-4 pm daily

## 2024-02-20 NOTE — Plan of Care (Signed)

## 2024-02-20 NOTE — Progress Notes (Signed)
 [facility] SURGICAL PROGRESS NOTE   Hospital Day(s): 6.   Interval History: Continues to have waxing and waning obstructive symptoms, says he feels bloated and does not want to eat but also has bowel movemetns. KUB reviewed and more dilated loops of small bowel.   Review of Systems:  Constitutional: denies fever, chills  HEENT: denies cough or congestion  Respiratory: denies any shortness of breath  Cardiovascular: denies chest pain or palpitations  Gastrointestinal: denied abdominal pain, nausea, emesis Genitourinary: denies burning with urination or urinary frequency Musculoskeletal: denies pain, decreased motor or sensation  Vital signs in last 24 hours: [min-max] current  Temp:  [98.3 F (36.8 C)-98.4 F (36.9 C)] 98.4 F (36.9 C) (01/16 0327) Pulse Rate:  [83-87] 85 (01/16 0327) Resp:  [16-18] 18 (01/16 0327) BP: (134-145)/(60-70) 134/60 (01/16 0327) SpO2:  [91 %-96 %] 96 % (01/16 0327)     Height: 5' 10 (177.8 cm) Weight: 67.6 kg BMI (Calculated): 21.38   Intake/Output last 2 shifts:  01/15 0701 - 01/16 0700 In: 480 [P.O.:480] Out: 100 [Urine:100]   Physical Exam:  Constitutional: alert, cooperative and no distress  HENT: normocephalic without obvious abnormality Eyes: PERRL, EOM's grossly intact and symmetric  Respiratory: breathing non-labored at rest  Cardiovascular: regular rate and sinus rhythm  Gastrointestinal: soft, non-tender, distention worse today. No rebound/guarding.   Labs:     Latest Ref Rng & Units 02/17/2024   10:34 PM 02/16/2024    9:24 AM 02/15/2024    6:11 AM  CBC  WBC 4.0 - 10.5 K/uL 9.3  10.2  9.3   Hemoglobin 13.0 - 17.0 g/dL 86.9  85.9  86.3   Hematocrit 39.0 - 52.0 % 38.8  41.8  39.8   Platelets 150 - 400 K/uL 204  221  222       Latest Ref Rng & Units 02/19/2024    5:15 AM 02/17/2024   10:34 PM 02/16/2024    9:24 AM  CMP  Glucose 70 - 99 mg/dL 98  98  89   BUN 8 - 23 mg/dL 19  12  15    Creatinine 0.61 - 1.24 mg/dL  9.04  9.16  9.04   Sodium 135 - 145 mmol/L 141  139  140   Potassium 3.5 - 5.1 mmol/L 4.4  4.1  4.7   Chloride 98 - 111 mmol/L 102  101  103   CO2 22 - 32 mmol/L 30  30  26    Calcium  8.9 - 10.3 mg/dL 8.7  8.7  8.5   Total Protein 6.5 - 8.1 g/dL  6.1  6.3   Total Bilirubin 0.0 - 1.2 mg/dL  1.5  1.6   Alkaline Phos 38 - 126 U/L  44  49   AST 15 - 41 U/L  26  18   ALT 0 - 44 U/L  18  11      Imaging studies:   KUB (02/19/2024) personally reviewed with worsening small bowel dilatoin    Assessment/Plan:  84 y.o. male with small bowel obstruction, clinically improving   - concern that he has partial obstruction: Having bowel movements but SB still distended and abdominal exam worsened. Discussed with him that we will get repeat CT to make sure there are no new findings save for partial obstruction. I also discussed that it is likely he will need surgical intervention pending CT scan. For now will make NPO. He may also need NGT depending on findings of CT scan.            -  Will place order for PICC line as I think he would benefit from TPN  35 minutes spent reviewing chart, performing interval H and P and discussing tx options  -- Jayson MALVA Endow

## 2024-02-20 NOTE — Progress Notes (Signed)
" ° ° °  PROCEDURAL EXPEDITER PROGRESS NOTE  Patient Name: Lonnie Schaefer  DOB:December 21, 1940 Date of Admission: 02/14/2024  Date of Assessment:02/20/24   -------------------------------------------------------------------------------------------------------------------   Brief clinical summary: Pt to OR tomorrow for exploratory laparotomy  Orders in place:  No   Communication with surgical team if no orders: IB MD  Labs, test, and orders reviewed: Y  Requires surgical clearance:  No  Barriers noted: N/A  -------------------------------------------------------------------------------------------------------------------  [facility] [place], [place], [place] Please contact us  directly via secure chat (search for [facility]) or by calling us  at [phone] [facility]).  "

## 2024-02-20 NOTE — Progress Notes (Signed)
" °  PROGRESS NOTE    Lonnie Schaefer  FMW:[id number] DOB: 1940/08/07 DOA: 02/14/2024 PCP: Samie Frederick, PA-C  146A/[id number]-AA  LOS: 6 days   Brief hospital course:   Lonnie Schaefer is an 84 year old male with COPD, CAD, inguinal hernia s/p repair and prior SBO, who presented to the emergency department with acute onset recurrent nausea and vomiting as well as diarrhea and abdominal pain.  Labs consistent with mild AKI, leukocytosis of 11.5.  CT abdomen pelvis with SBO, transition point in the distal jejunum.  Also revealed small fat-containing supraumbilical midline ventral hernia and bilateral inguinal hernias without bowel herniation.  Colonic diverticulosis without diverticulitis. Inguinal hernia was reduced by Dr. Viviann.  NG tube was inserted.  General surgery was consulted.  Patient has had gradual return of bowel function, even though his NGT output has been high.  NG tube clamped with minimal residual on 1/13, patient was started on liquids.   SBO - Had returned of bowel function.  Flatus and BM, however, continued to have nausea and abdominal pain. - General Surgery following, likely partial SBO but still with significant distension  -- made NPO, placed on TPN, Repeat CT abdomen, May need surgery if no improvement  -- Increase ambulation, Keep K >4    COPD, not currently in exacerbation Nocturnal hypoxia on 2L O2 nightly --cont bronchodilators   CAD - Stable.     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  S/p radiation therapy - Following outpatient with ENT and oncology   Acquired hypothyroidism --cont Synthroid    Osteoporosis - Continue outpatient follow-up   DVT prophylaxis: Lovenox  SQ Code Status: DNR  Family Communication:  Level of care: Telemetry Dispo:   The patient is from: home Anticipated d/c is to: home Anticipated d/c date is: to be determined   Subjective and Interval History:    Patient seen and examined Kept NPO by surgery due to persistent distension and symptoms Denies any abdominal pain   Objective: Vitals:   02/19/24 1936 02/19/24 2138 02/20/24 0327 02/20/24 0919  BP: (!) 145/70  134/60 (!) 106/52  Pulse: 87  85 82  Resp: 16  18 17   Temp: 98.3 F (36.8 C)  98.4 F (36.9 C) 98 F (36.7 C)  TempSrc:      SpO2: 91% 93% 96% 92%  Weight:      Height:        Intake/Output Summary (Last 24 hours) at 02/20/2024 1314 Last data filed at 02/20/2024 0326 Gross per 24 hour  Intake 480 ml  Output 100 ml  Net 380 ml   Filed Weights   02/14/24 1826 02/15/24 0642  Weight: 69.8 kg 67.6 kg    Examination:   Constitutional: NAD, AAOx3 HEENT: conjunctivae and lids normal, EOMI CV: No cyanosis.   RESP: normal respiratory effort, on RA Neuro: II - XII grossly intact.     Data Reviewed: I have personally reviewed labs and imaging studies  Time spent: 35 minutes  Genell JONELLE Overcast, MD [facility] If 7PM-7AM, please contact night-coverage 02/20/2024, 1:14 PM   "

## 2024-02-20 NOTE — Progress Notes (Signed)
 Mobility Specialist - Progress Note   02/20/24 1433  Mobility  Activity Ambulated with assistance  Level of Assistance Standby assist, set-up cues, supervision of patient - no hands on  Assistive Device Front wheel walker  Distance Ambulated (ft) 320 ft  Activity Response Tolerated well  Mobility visit 1 Mobility  Mobility Specialist Start Time (ACUTE ONLY) 1414  Mobility Specialist Stop Time (ACUTE ONLY) 1430  Mobility Specialist Time Calculation (min) (ACUTE ONLY) 16 min   Pt supine upon entry, utilizing O2--- placed on RA during amb, O2 >90%. Pt completed bed mob ModI, STS to RW and amb two laps around the NS with sup--- denies SOB or fatigue. He returned to the room, left seated EOB with alarm set and needs within reach.  America Silvan Mobility Specialist 02/20/24 2:39 PM

## 2024-02-20 NOTE — Progress Notes (Signed)
 Peripherally Inserted Central Catheter Placement  The IV Nurse has discussed with the patient and/or persons authorized to consent for the patient, the purpose of this procedure and the potential benefits and risks involved with this procedure.  The benefits include less needle sticks, lab draws from the catheter, and the patient may be discharged home with the catheter. Risks include, but not limited to, infection, bleeding, blood clot (thrombus formation), and puncture of an artery; nerve damage and irregular heartbeat and possibility to perform a PICC exchange if needed/ordered by physician.  Alternatives to this procedure were also discussed.  Bard Power PICC patient education guide, fact sheet on infection prevention and patient information card has been provided to patient /or left at bedside.    PICC Placement Documentation  PICC Double Lumen 02/20/24 Right Basilic 40 cm 0 cm (Active)  Indication for Insertion or Continuance of Line Administration of hyperosmolar/irritating solutions (i.e. TPN, Vancomycin, etc.) 02/20/24 1332  Exposed Catheter (cm) 0 cm 02/20/24 1332  Site Assessment Clean, Dry, Intact 02/20/24 1332  Lumen #1 Status Flushed;Blood return noted;Saline locked 02/20/24 1332  Lumen #2 Status Flushed;Blood return noted;Saline locked 02/20/24 1332  Dressing Type Transparent 02/20/24 1332  Dressing Status Antimicrobial disc/dressing in place 02/20/24 1332  Line Care Connections checked and tightened 02/20/24 1332  Line Adjustment (NICU/IV Team Only) No 02/20/24 1332  Dressing Intervention New dressing 02/20/24 1332  Dressing Change Due 03/02/2024 02/20/24 1332       Peony Barner Ramos 02/20/2024, 1:33 PM

## 2024-02-21 ENCOUNTER — Inpatient Hospital Stay: Admitting: Certified Registered"

## 2024-02-21 ENCOUNTER — Encounter: Admission: EM | Payer: Self-pay | Source: Home / Self Care | Attending: Obstetrics and Gynecology

## 2024-02-21 DIAGNOSIS — K56609 Unspecified intestinal obstruction, unspecified as to partial versus complete obstruction: Secondary | ICD-10-CM | POA: Diagnosis not present

## 2024-02-21 DIAGNOSIS — E43 Unspecified severe protein-calorie malnutrition: Secondary | ICD-10-CM | POA: Insufficient documentation

## 2024-02-21 HISTORY — PX: LYSIS OF ADHESION: SHX5961

## 2024-02-21 HISTORY — PX: LAPAROTOMY: SHX154

## 2024-02-21 LAB — BASIC METABOLIC PANEL WITH GFR
Anion gap: 5 (ref 5–15)
BUN: 15 mg/dL (ref 8–23)
CO2: 30 mmol/L (ref 22–32)
Calcium: 8.6 mg/dL — ABNORMAL LOW (ref 8.9–10.3)
Chloride: 105 mmol/L (ref 98–111)
Creatinine, Ser: 0.83 mg/dL (ref 0.61–1.24)
GFR, Estimated: >60 mL/min
Glucose, Bld: 114 mg/dL — ABNORMAL HIGH (ref 70–99)
Potassium: 4.8 mmol/L (ref 3.5–5.1)
Sodium: 141 mmol/L (ref 135–145)

## 2024-02-21 LAB — CBC
HCT: 36.9 % — ABNORMAL LOW (ref 39.0–52.0)
Hemoglobin: 12.3 g/dL — ABNORMAL LOW (ref 13.0–17.0)
MCH: 31.8 pg (ref 26.0–34.0)
MCHC: 33.3 g/dL (ref 30.0–36.0)
MCV: 95.3 fL (ref 80.0–100.0)
Platelets: 216 K/uL (ref 150–400)
RBC: 3.87 MIL/uL — ABNORMAL LOW (ref 4.22–5.81)
RDW: 13.5 % (ref 11.5–15.5)
WBC: 5.1 K/uL (ref 4.0–10.5)
nRBC: 0 % (ref 0.0–0.2)

## 2024-02-21 LAB — GLUCOSE, CAPILLARY
Glucose-Capillary: 114 mg/dL — ABNORMAL HIGH (ref 70–99)
Glucose-Capillary: 129 mg/dL — ABNORMAL HIGH (ref 70–99)
Glucose-Capillary: 154 mg/dL — ABNORMAL HIGH (ref 70–99)
Glucose-Capillary: 171 mg/dL — ABNORMAL HIGH (ref 70–99)

## 2024-02-21 LAB — PHOSPHORUS: Phosphorus: 2.3 mg/dL — ABNORMAL LOW (ref 2.5–4.6)

## 2024-02-21 LAB — MAGNESIUM: Magnesium: 2.2 mg/dL (ref 1.7–2.4)

## 2024-02-21 LAB — TRIGLYCERIDES: Triglycerides: 77 mg/dL

## 2024-02-21 MED ORDER — FENTANYL CITRATE (PF) 100 MCG/2ML IJ SOLN
25.0000 ug | INTRAMUSCULAR | Status: DC | PRN
  Administered 2024-02-21 (×2): 50 ug via INTRAVENOUS

## 2024-02-21 MED ORDER — SUCCINYLCHOLINE CHLORIDE 200 MG/10ML IV SOSY
PREFILLED_SYRINGE | INTRAVENOUS | Status: DC | PRN
  Administered 2024-02-21: 100 mg via INTRAVENOUS

## 2024-02-21 MED ORDER — SODIUM CHLORIDE 0.9 % IV SOLN
2.0000 g | Freq: Two times a day (BID) | INTRAVENOUS | Status: AC
  Administered 2024-02-21 – 2024-02-22 (×2): 2 g via INTRAVENOUS
  Filled 2024-02-21 (×2): qty 2

## 2024-02-21 MED ORDER — SEPRAFILM FOR OPTIME
ORAL_FILM | TOPICAL | Status: DC | PRN
  Administered 2024-02-21: 4 via TOPICAL

## 2024-02-21 MED ORDER — ONDANSETRON HCL 4 MG/2ML IJ SOLN
INTRAMUSCULAR | Status: AC
  Filled 2024-02-21: qty 2

## 2024-02-21 MED ORDER — FENTANYL CITRATE (PF) 100 MCG/2ML IJ SOLN
INTRAMUSCULAR | Status: AC
  Filled 2024-02-21: qty 2

## 2024-02-21 MED ORDER — ROCURONIUM BROMIDE 100 MG/10ML IV SOLN
INTRAVENOUS | Status: DC | PRN
  Administered 2024-02-21: 20 mg via INTRAVENOUS
  Administered 2024-02-21: 40 mg via INTRAVENOUS
  Administered 2024-02-21: 20 mg via INTRAVENOUS

## 2024-02-21 MED ORDER — BUPIVACAINE LIPOSOME 1.3 % IJ SUSP
INTRAMUSCULAR | Status: AC
  Filled 2024-02-21: qty 20

## 2024-02-21 MED ORDER — SUCCINYLCHOLINE CHLORIDE 200 MG/10ML IV SOSY
PREFILLED_SYRINGE | INTRAVENOUS | Status: AC
  Filled 2024-02-21: qty 10

## 2024-02-21 MED ORDER — SUGAMMADEX SODIUM 200 MG/2ML IV SOLN
INTRAVENOUS | Status: DC | PRN
  Administered 2024-02-21: 150 mg via INTRAVENOUS

## 2024-02-21 MED ORDER — DEXMEDETOMIDINE HCL IN NACL 80 MCG/20ML IV SOLN
INTRAVENOUS | Status: DC | PRN
  Administered 2024-02-21: 8 ug via INTRAVENOUS

## 2024-02-21 MED ORDER — TRAVASOL 10 % IV SOLN
INTRAVENOUS | Status: AC
  Filled 2024-02-21: qty 748.8

## 2024-02-21 MED ORDER — LACTATED RINGERS IV SOLN: INTRAVENOUS | Status: DC | PRN

## 2024-02-21 MED ORDER — OXYCODONE HCL 5 MG PO TABS
5.0000 mg | ORAL_TABLET | Freq: Four times a day (QID) | ORAL | Status: DC | PRN
  Administered 2024-02-21 – 2024-02-24 (×3): 10 mg via ORAL
  Administered 2024-02-24 – 2024-02-25 (×4): 5 mg via ORAL
  Administered 2024-02-25: 10 mg via ORAL
  Filled 2024-02-21: qty 1
  Filled 2024-02-21 (×3): qty 2
  Filled 2024-02-21 (×2): qty 1
  Filled 2024-02-21: qty 2
  Filled 2024-02-21: qty 1
  Filled 2024-02-21: qty 2

## 2024-02-21 MED ORDER — ROCURONIUM BROMIDE 10 MG/ML (PF) SYRINGE
PREFILLED_SYRINGE | INTRAVENOUS | Status: AC
  Filled 2024-02-21: qty 10

## 2024-02-21 MED ORDER — ACETAMINOPHEN 10 MG/ML IV SOLN
INTRAVENOUS | Status: DC | PRN
  Administered 2024-02-21: 1000 mg via INTRAVENOUS

## 2024-02-21 MED ORDER — PROPOFOL 10 MG/ML IV BOLUS
INTRAVENOUS | Status: DC | PRN
  Administered 2024-02-21: 100 mg via INTRAVENOUS

## 2024-02-21 MED ORDER — SODIUM CHLORIDE (PF) 0.9 % IJ SOLN
INTRAMUSCULAR | Status: AC
  Filled 2024-02-21: qty 50

## 2024-02-21 MED ORDER — ACETAMINOPHEN 10 MG/ML IV SOLN
INTRAVENOUS | Status: AC
  Filled 2024-02-21: qty 100

## 2024-02-21 MED ORDER — DEXAMETHASONE SOD PHOSPHATE PF 10 MG/ML IJ SOLN
INTRAMUSCULAR | Status: AC
  Filled 2024-02-21: qty 1

## 2024-02-21 MED ORDER — SODIUM CHLORIDE (PF) 0.9 % IJ SOLN
INTRAMUSCULAR | Status: DC | PRN
  Administered 2024-02-21: 100 mL

## 2024-02-21 MED ORDER — SODIUM PHOSPHATES 45 MMOLE/15ML IV SOLN
15.0000 mmol | Freq: Once | INTRAVENOUS | Status: AC
  Administered 2024-02-21: 15 mmol via INTRAVENOUS
  Filled 2024-02-21: qty 5

## 2024-02-21 MED ORDER — ONDANSETRON HCL 4 MG/2ML IJ SOLN
INTRAMUSCULAR | Status: DC | PRN
  Administered 2024-02-21: 4 mg via INTRAVENOUS

## 2024-02-21 MED ORDER — PHENYLEPHRINE 80 MCG/ML (10ML) SYRINGE FOR IV PUSH (FOR BLOOD PRESSURE SUPPORT)
PREFILLED_SYRINGE | INTRAVENOUS | Status: AC
  Filled 2024-02-21: qty 10

## 2024-02-21 MED ORDER — LIDOCAINE HCL (CARDIAC) PF 100 MG/5ML IV SOSY
PREFILLED_SYRINGE | INTRAVENOUS | Status: DC | PRN
  Administered 2024-02-21: 10 mg via INTRAVENOUS

## 2024-02-21 MED ORDER — MORPHINE SULFATE (PF) 2 MG/ML IV SOLN
2.0000 mg | INTRAVENOUS | Status: AC | PRN
  Administered 2024-02-21 – 2024-02-22 (×2): 2 mg via INTRAVENOUS
  Filled 2024-02-21 (×3): qty 1

## 2024-02-21 MED ORDER — DEXAMETHASONE SOD PHOSPHATE PF 10 MG/ML IJ SOLN
INTRAMUSCULAR | Status: DC | PRN
  Administered 2024-02-21: 10 mg via INTRAVENOUS

## 2024-02-21 MED ORDER — FENTANYL CITRATE (PF) 100 MCG/2ML IJ SOLN
INTRAMUSCULAR | Status: DC | PRN
  Administered 2024-02-21: 25 ug via INTRAVENOUS
  Administered 2024-02-21: 50 ug via INTRAVENOUS
  Administered 2024-02-21: 25 ug via INTRAVENOUS

## 2024-02-21 MED ORDER — 0.9 % SODIUM CHLORIDE (POUR BTL) OPTIME
TOPICAL | Status: DC | PRN
  Administered 2024-02-21: 500 mL

## 2024-02-21 MED ORDER — PHENYLEPHRINE HCL (PRESSORS) 10 MG/ML IV SOLN
INTRAVENOUS | Status: DC | PRN
  Administered 2024-02-21: 80 ug via INTRAVENOUS

## 2024-02-21 MED ORDER — LIDOCAINE HCL (PF) 2 % IJ SOLN
INTRAMUSCULAR | Status: AC
  Filled 2024-02-21: qty 5

## 2024-02-21 MED ORDER — BUPIVACAINE-EPINEPHRINE (PF) 0.25% -1:200000 IJ SOLN
INTRAMUSCULAR | Status: AC
  Filled 2024-02-21: qty 30

## 2024-02-21 MED ORDER — GLYCOPYRROLATE 0.2 MG/ML IJ SOLN
INTRAMUSCULAR | Status: AC
  Filled 2024-02-21: qty 1

## 2024-02-21 NOTE — Progress Notes (Signed)
" °  PROGRESS NOTE    [name]  FMW:[id number] DOB: 1940-07-27 DOA: 02/14/2024 PCP: Samie Frederick, PA-C  146A/[id number]-AA  LOS: 7 days   Brief hospital course:   Assessment & Plan: Lonnie Schaefer is an 84 year old male with COPD, CAD, inguinal hernia s/p repair and prior SBO, who presented to the emergency department with acute onset recurrent nausea and vomiting as well as diarrhea and abdominal pain.  Labs consistent with mild AKI, leukocytosis of 11.5.  CT abdomen pelvis with SBO, transition point in the distal jejunum.  Also revealed small fat-containing supraumbilical midline ventral hernia and bilateral inguinal hernias without bowel herniation.  Colonic diverticulosis without diverticulitis. Inguinal hernia was reduced by Dr. Viviann.  NG tube was inserted.  General surgery was consulted.  Patient has had gradual return of bowel function, even though his NGT output has been high.  NG tube clamped with minimal residual on 1/13, patient was started on liquids.   SBO Failed conservative mgmt. Ex lap today w/ lysis of adhesions, repair of two ventral hernias.  - continue tpn - diet per gen surg   COPD, not currently in exacerbation Nocturnal hypoxia on 2L O2 nightly --cont bronchodilators   CAD - Stable.     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  S/p radiation therapy - Following outpatient with ENT and oncology   Acquired hypothyroidism --cont Synthroid    Osteoporosis - Continue outpatient follow-up   DVT prophylaxis: Lovenox  SQ Code Status: DNR  Family Communication: wife and daughter both telephoned today, no answer Level of care: Telemetry Dispo:   The patient is from: home Anticipated d/c is to: home pending PT re-eval Anticipated d/c date is: to be determined   Subjective and Interval History:   Seen after surgery. Pain controlled. No nausea/vomiting  Objective: Vitals:   02/21/24 1345 02/21/24 1350  02/21/24 1400 02/21/24 1426  BP: (!) 128/58  135/64 (!) 132/57  Pulse: 87 87 89 92  Resp: 16 19 15 18   Temp:   98.4 F (36.9 C) 98 F (36.7 C)  TempSrc:      SpO2: 92% 93% 90% 91%  Weight:      Height:        Intake/Output Summary (Last 24 hours) at 02/21/2024 1551 Last data filed at 02/21/2024 1440 Gross per 24 hour  Intake 1467.57 ml  Output 250 ml  Net 1217.57 ml   Filed Weights   02/14/24 1826 02/15/24 0642 02/21/24 0504  Weight: 69.8 kg 67.6 kg 64.9 kg    Examination:   Constitutional: NAD, AAOx3 HEENT: conjunctivae and lids normal, EOMI Abdomen: soft, mildly tender, not distended, midline incision stapled c/d/i CV: No cyanosis.   RESP: normal respiratory effor =t Neuro: moving all 4   Data Reviewed: I have personally reviewed labs and imaging studies    Lonnie KATHEE Ban, MD [facility] If 7PM-7AM, please contact night-coverage 02/21/2024, 3:51 PM   "

## 2024-02-21 NOTE — Anesthesia Preprocedure Evaluation (Signed)
"                                    Anesthesia Evaluation  Patient identified by MRN, date of birth, ID band Patient awake    Reviewed: Allergy & Precautions, H&P , NPO status , Patient's Chart, lab work & pertinent test results  Airway Mallampati: II  TM Distance: >3 FB Neck ROM: Full    Dental no notable dental hx. (+) Edentulous Upper, Edentulous Lower   Pulmonary shortness of breath and Long-Term Oxygen  Therapy, pneumonia, COPD, former smoker   Pulmonary exam normal breath sounds clear to auscultation       Cardiovascular + CAD  Normal cardiovascular exam Rhythm:Regular Rate:Normal     Neuro/Psych negative neurological ROS  negative psych ROS   GI/Hepatic Neg liver ROS, hiatal hernia,,,  Endo/Other  Hypothyroidism    Renal/GU Renal disease  negative genitourinary   Musculoskeletal negative musculoskeletal ROS (+)    Abdominal   Peds negative pediatric ROS (+)  Hematology negative hematology ROS (+)   Anesthesia Other Findings   Reproductive/Obstetrics negative OB ROS                              Anesthesia Physical Anesthesia Plan  ASA: 3  Anesthesia Plan: General   Post-op Pain Management:    Induction: Intravenous  PONV Risk Score and Plan: 0  Airway Management Planned: Oral ETT  Additional Equipment:   Intra-op Plan:   Post-operative Plan: Extubation in OR  Informed Consent: I have reviewed the patients History and Physical, chart, labs and discussed the procedure including the risks, benefits and alternatives for the proposed anesthesia with the patient or authorized representative who has indicated his/her understanding and acceptance.     Dental advisory given  Plan Discussed with: CRNA  Anesthesia Plan Comments:          Anesthesia Quick Evaluation  "

## 2024-02-21 NOTE — Progress Notes (Signed)
 Preoperative Review   Patient is met in the preoperatively. The history is reviewed in the chart and with the patient. I personally reviewed the options and rationale as well as the risks of this procedure that have been previously discussed with the patient. All questions asked by the patient and/or family were answered to their satisfaction. Plan for laparotomy and possible bowel resection depending on operative findings. Procedure d/w him, R, B and possible complications including but not limited to bleeding, infection, fistula, hernia, pain.  Patient agrees to proceed with this procedure at this time.  Laneta Luna M.D. FACS

## 2024-02-21 NOTE — Anesthesia Procedure Notes (Signed)
 Procedure Name: Intubation Date/Time: 02/21/2024 11:27 AM  Performed by: Leontine Katz, CRNAPre-anesthesia Checklist: Patient identified, Patient being monitored, Timeout performed, Emergency Drugs available and Suction available Patient Re-evaluated:Patient Re-evaluated prior to induction Oxygen  Delivery Method: Circle system utilized Preoxygenation: Pre-oxygenation with 100% oxygen  Induction Type: IV induction and Rapid sequence Laryngoscope Size: 3 and McGrath Grade View: Grade I Tube type: Oral Tube size: 7.5 mm Number of attempts: 1 Airway Equipment and Method: Stylet and Video-laryngoscopy Placement Confirmation: ETT inserted through vocal cords under direct vision, positive ETCO2 and breath sounds checked- equal and bilateral Secured at: 21 cm Tube secured with: Tape Dental Injury: Teeth and Oropharynx as per pre-operative assessment

## 2024-02-21 NOTE — Consult Note (Signed)
 PHARMACY - TOTAL PARENTERAL NUTRITION CONSULT NOTE   Indication: Small bowel obstruction  Patient Measurements: Height: 5' 10 (177.8 cm) Weight: 64.9 kg (143 lb 1.3 oz) IBW/kg (Calculated) : 73 TPN AdjBW (KG): 67.6 Body mass index is 20.53 kg/m.   Assessment:  84 yo male presented on 02/14/24 to ED with complaint of abdominal pain and constipation.  Imaging was concerning for SBO.  Patient had NGT placed for decompression.  Patient had had some improvement and was started on diet, however symptoms have persisted and patient not wanting to eat.  Per surgery note current plan is for patient to be NPO again and initiate TPN.  Repeat CT imaging ordered by surgery, surgical intervention may be needed.  Glucose / Insulin : No history of diabetes.  Will order sensitive SSI with q8 hours CBG monitoring with initiation of TPN. Electrolytes: Electolytes WNL Renal: Scr < 1 Hepatic: LFTs WNL Intake / Output; MIVF:   Intake/Output Summary (Last 24 hours) at 02/21/2024 0839 Last data filed at 02/21/2024 0700 Gross per 24 hour  Intake 357.06 ml  Output --  Net 357.06 ml    GI Imaging: 1/15 KUB: Evidence of known partial small bowel obstruction with interval increased number of dilated small bowel loops. GI Surgeries / Procedures:  No surgical procedure at this time Central access: Order in for PICC placement TPN start date: 02/20/24  Nutritional Goals: Goal TPN rate is 80 mL/hr (provides 99.8 g of protein and 1858 kcals per day)  RD Assessment: Estimated Needs Total Energy Estimated Needs: 1800-2000 Total Protein Estimated Needs: 90-105 grams Total Fluid Estimated Needs: 1.8-2.0 L  Current Nutrition:  NPO  Plan:  Increase TPN rate to 60 mL/hr at 1800 (approximately 1/3 rate due to high refeeding risk) Electrolytes in TPN: Na 56mEq/L, K 70mEq/L, Ca 69mEq/L, Mg 6mEq/L, and Phos 15mmol/L. Cl:Ac 1:1 Add standard MVI and trace elements to TPN Initiate Sensitive q8h SSI and adjust as needed   No MIVF at this time. Thiamine  100 mg IV daily x 7 days per dietary recommendations Replace Phos with Na{Phos 15mmol x 1 today Monitor TPN labs on Mon/Thurs, will initially monitor daily labs due to high refeeding risk  Sanae Willetts Rodriguez-Guzman PharmD, BCPS 02/21/2024 8:41 AM

## 2024-02-21 NOTE — Transfer of Care (Signed)
 Immediate Anesthesia Transfer of Care Note  Patient: Lonnie Schaefer  Procedure(s) Performed: LAPAROTOMY, EXPLORATORY LAPAROTOMY, FOR LYSIS OF ADHESIONS  Patient Location: PACU  Anesthesia Type:General  Level of Consciousness: sedated  Airway & Oxygen  Therapy: Patient Spontanous Breathing and Patient connected to face mask oxygen   Post-op Assessment: Report given to RN and Post -op Vital signs reviewed and stable  Post vital signs: Reviewed  Last Vitals:  Vitals Value Taken Time  BP 160/64 02/21/24 13:08  Temp    Pulse 79 02/21/24 13:11  Resp 25 02/21/24 13:11  SpO2 97 % 02/21/24 13:11  Vitals shown include unfiled device data.  Last Pain:  Vitals:   02/21/24 0940  TempSrc:   PainSc: 0-No pain         Complications: No notable events documented.

## 2024-02-21 NOTE — Op Note (Signed)
 PROCEDURES: Laparotomy with extensive enterolysis Repair of ventral hernia x 2 primarily  Pre-operative Diagnosis: SBO  Post-operative Diagnosis: same  Surgeon: Laneta FALCON Jenavive Lamboy   Anesthesia: General endotracheal anesthesia  ASA Class: 3   Surgeon: Laneta Luna , MD FACS  Anesthesia: Gen. with endotracheal tube  Findings: One band from omentum causing obstruction within proximal ileum, extensive adhesions from abdominal wall to bowel, bowel to bowel,  Periumbilical ventral hernia 2 cm Epigastric ventral hernia 1 cm  Estimated Blood Loss: 20cc               Complications: none               Condition: stable  Procedure Details  The patient was seen again in the Holding Room. The benefits, complications, treatment options, and expected outcomes were discussed with the patient. The risks of bleeding, infection, recurrence of symptoms, failure to resolve symptoms,  bowel injury, any of which could require further surgery were reviewed with the patient.   The patient was taken to Operating Room, identified as Lonnie Schaefer and the procedure verified.  A Time Out was held and the above information confirmed.  Prior to the induction of general anesthesia, antibiotic prophylaxis was administered. VTE prophylaxis was in place. General endotracheal anesthesia was then administered and tolerated well. After the induction, the abdomen was prepped with Chloraprep and draped in the sterile fashion. The patient was positioned in the supine position. Midline laparotomy incision was created removing scar tissue and umbilicus due to poor perfusion of the later. The abdominal cavity was entered under direct visualization after dividing the fascia and elevating the peritoneum and gaining access to the abdominal cavity w/o injuries. There were two separate midline ventral hernia, sac dissected and defects were incorporated into our fascial opening. There was dense adhesions from the omentum to the  abdominal wall and small bowel to abd wall, please note that lysis of adhesion was  very labor intensive due to the extensive nature of them. We were able to lyse them with metz and electra cautery where it was deemed safe. Transition point was caused by a band of omentum pinching the bowel this was divided. I was able to restore the anatomy and relieve the obstruction. The bowel was run from treitz to cecum, no injuries, masses or injuries were seen.   Note that enterolysis took about 80 minutes of operative time. Seprafilm was placed to prevent further adhesions Liposomal Marcaine   was injected as bilateral TAP block under direct visualization.  A second look showed no evidence of any bleeding or any other injuries. We changed gloves and closed the   abdomen with -0 PDS suture in a running fashion incorporating the two hernia defects in our closure and the skin was closed with staples. Needle and laparotomy count were correct and there were no immediate occasions  Note that this procedure was significantly more complex, difficult and time-consuming than typically required due to extensive adhesions, scar tissue and two ventral hernias that needed to be addressed to perform an appropriate and safe surgery.  SABRA Laneta Luna, MD, FACS

## 2024-02-21 NOTE — Anesthesia Postprocedure Evaluation (Signed)
"   Anesthesia Post Note  Patient: Lonnie Schaefer  Procedure(s) Performed: LAPAROTOMY, EXPLORATORY LAPAROTOMY, FOR LYSIS OF ADHESIONS  Patient location during evaluation: PACU Anesthesia Type: General Level of consciousness: awake and alert Pain management: pain level controlled Vital Signs Assessment: post-procedure vital signs reviewed and stable Respiratory status: spontaneous breathing, nonlabored ventilation, respiratory function stable and patient connected to nasal cannula oxygen  Cardiovascular status: blood pressure returned to baseline and stable Postop Assessment: no apparent nausea or vomiting Anesthetic complications: no   No notable events documented.   Last Vitals:  Vitals:   02/21/24 1315 02/21/24 1335  BP: (!) 150/66   Pulse: 81 82  Resp: (!) 27 (!) 22  Temp:    SpO2: 97% 99%    Last Pain:  Vitals:   02/21/24 1309  TempSrc:   PainSc: Asleep                 Fairy A Nirvan Laban      "

## 2024-02-21 NOTE — Plan of Care (Signed)
  Problem: Education: Goal: Knowledge of General Education information will improve Description: Including pain rating scale, medication(s)/side effects and non-pharmacologic comfort measures Outcome: Progressing   Problem: Activity: Goal: Risk for activity intolerance will decrease Outcome: Progressing   Problem: Coping: Goal: Level of anxiety will decrease Outcome: Progressing   Problem: Elimination: Goal: Will not experience complications related to urinary retention Outcome: Progressing   Problem: Pain Managment: Goal: General experience of comfort will improve and/or be controlled Outcome: Progressing   Problem: Safety: Goal: Ability to remain free from injury will improve Outcome: Progressing

## 2024-02-22 ENCOUNTER — Inpatient Hospital Stay

## 2024-02-22 ENCOUNTER — Encounter: Payer: Self-pay | Admitting: Surgery

## 2024-02-22 DIAGNOSIS — K56609 Unspecified intestinal obstruction, unspecified as to partial versus complete obstruction: Secondary | ICD-10-CM | POA: Diagnosis not present

## 2024-02-22 LAB — BASIC METABOLIC PANEL WITH GFR
Anion gap: 8 (ref 5–15)
BUN: 15 mg/dL (ref 8–23)
CO2: 29 mmol/L (ref 22–32)
Calcium: 8.7 mg/dL — ABNORMAL LOW (ref 8.9–10.3)
Chloride: 107 mmol/L (ref 98–111)
Creatinine, Ser: 0.96 mg/dL (ref 0.61–1.24)
GFR, Estimated: >60 mL/min
Glucose, Bld: 144 mg/dL — ABNORMAL HIGH (ref 70–99)
Potassium: 4.9 mmol/L (ref 3.5–5.1)
Sodium: 144 mmol/L (ref 135–145)

## 2024-02-22 LAB — PHOSPHORUS: Phosphorus: 3 mg/dL (ref 2.5–4.6)

## 2024-02-22 LAB — GLUCOSE, CAPILLARY
Glucose-Capillary: 171 mg/dL — ABNORMAL HIGH (ref 70–99)
Glucose-Capillary: 149 mg/dL — ABNORMAL HIGH (ref 70–99)
Glucose-Capillary: 127 mg/dL — ABNORMAL HIGH (ref 70–99)

## 2024-02-22 LAB — MAGNESIUM: Magnesium: 2.2 mg/dL (ref 1.7–2.4)

## 2024-02-22 MED ORDER — TRAVASOL 10 % IV SOLN
INTRAVENOUS | Status: AC
  Filled 2024-02-22: qty 998.4

## 2024-02-22 MED ORDER — MORPHINE SULFATE (PF) 2 MG/ML IV SOLN
2.0000 mg | INTRAVENOUS | Status: DC | PRN
  Administered 2024-02-22 – 2024-02-24 (×8): 2 mg via INTRAVENOUS
  Filled 2024-02-22 (×7): qty 1

## 2024-02-22 MED ORDER — TAMSULOSIN HCL 0.4 MG PO CAPS
0.4000 mg | ORAL_CAPSULE | Freq: Every day | ORAL | Status: DC
  Administered 2024-02-22 – 2024-02-27 (×6): 0.4 mg via ORAL
  Filled 2024-02-22 (×6): qty 1

## 2024-02-22 MED ORDER — SODIUM CHLORIDE 0.9 % IV SOLN: INTRAVENOUS | Status: AC

## 2024-02-22 NOTE — Consult Note (Signed)
 PHARMACY - TOTAL PARENTERAL NUTRITION CONSULT NOTE   Indication: Small bowel obstruction  Patient Measurements: Height: 5' 10 (177.8 cm) Weight: 64.7 kg (142 lb 10.2 oz) IBW/kg (Calculated) : 73 TPN AdjBW (KG): 67.6 Body mass index is 20.47 kg/m.  Assessment:  84 yo male presented on 02/14/24 to ED with complaint of abdominal pain and constipation.  Imaging was concerning for SBO.  Patient had NGT placed for decompression.  Patient had had some improvement and was started on diet, however symptoms have persisted and patient not wanting to eat.  Per surgery note current plan is for patient to be NPO again and initiate TPN.  Repeat CT imaging ordered by surgery, surgical intervention may be needed.  Glucose / Insulin : No history of diabetes.  Will order sensitive SSI with q8 hours CBG monitoring with initiation of TPN. 2 units given in last 24 hours Electrolytes: Electolytes WNL Renal: Scr < 1 Hepatic: LFTs WNL Intake / Output; MIVF:   Intake/Output Summary (Last 24 hours) at 02/22/2024 0829 Last data filed at 02/22/2024 0410 Gross per 24 hour  Intake 2019.21 ml  Output 975 ml  Net 1044.21 ml    GI Imaging: 1/15 KUB: Evidence of known partial small bowel obstruction with interval increased number of dilated small bowel loops. GI Surgeries / Procedures: Laparotomy with extensive enterolysis Repair of ventral hernia x 2  Central access:02/20/24 TPN start date: 02/20/24  Nutritional Goals: Goal TPN rate is 80 mL/hr (provides 99.8 g of protein and 1858 kcals per day)  RD Assessment: Estimated Needs Total Energy Estimated Needs: 1800-2000 Total Protein Estimated Needs: 90-105 grams Total Fluid Estimated Needs: 1.8-2.0 L  Current Nutrition:  NPO  Plan:  Increase TPN rate to 80 mL/hr at 1800 (Goal rate) Electrolytes in TPN: Na 37mEq/L, K 70mEq/L, Ca 17mEq/L, Mg 99mEq/L, and Phos 15mmol/L. Cl:Ac 1:1 Add standard MVI and trace elements to TPN Initiate Sensitive q8h SSI and adjust  as needed  No MIVF at this time. Thiamine  100 mg IV daily x 7 days per dietary recommendations All electrolytes WNL after Kphos given 1/17 Monitor TPN labs on Mon/Thurs, will initially monitor daily labs due to high refeeding risk  Ocie Stanzione Rodriguez-Guzman PharmD, BCPS 02/22/2024 8:29 AM

## 2024-02-22 NOTE — Plan of Care (Signed)

## 2024-02-22 NOTE — Evaluation (Signed)
 Physical Therapy Evaluation Patient Details Name: Lonnie Schaefer MRN: [id number] DOB: Jun 14, 1940 Today's Date: 02/22/2024  History of Present Illness  Lonnie Schaefer is an 84 year old male with COPD, CAD, inguinal hernia s/p repair and prior SBO, who presented to the emergency department with acute onset recurrent nausea and vomiting as well as diarrhea and abdominal pain.  Labs consistent with mild AKI, leukocytosis of 11.5.  CT abdomen pelvis with SBO, transition point in the distal jejunum.  Also revealed small fat-containing supraumbilical midline ventral hernia and bilateral inguinal hernias without bowel herniation.  Colonic diverticulosis without diverticulitis. Inguinal hernia was reduced by Dr. Viviann.  NG tube was inserted.  General surgery was consulted.  Patient has had gradual return of bowel function, even though his NGT output has been high.  NG tube clamped with minimal residual on 1/13, patient was started on liquids.  Clinical Impression  Patient noted to be in supine position at PT arrival in room, for an initial PT evaluation due to a decline in functional status, with baseline mobility reported as independent, and currently requiring minA for 20' feet with RW. Gait was assessed with RW. Gait mechanic observations noted antalgic and cautious . The overall clinical impression is that the patient presents with mld to moderate mobility limitations mainly limited by pain. Recommended skilled PT will address safety, mobility, and discharge planning.        If plan is discharge home, recommend the following: Help with stairs or ramp for entrance   Can travel by private vehicle        Equipment Recommendations None recommended by PT  Recommendations for Other Services       Functional Status Assessment Patient has had a recent decline in their functional status and demonstrates the ability to make significant improvements in function in a reasonable and predictable amount of time.      Precautions / Restrictions Precautions Precautions: Fall Recall of Precautions/Restrictions: Intact Precaution/Restrictions Comments: mod fall Restrictions Weight Bearing Restrictions Per Provider Order: No      Mobility  Bed Mobility Overal bed mobility: Needs Assistance Bed Mobility: Supine to Sit     Supine to sit: Min assist          Transfers Overall transfer level: Needs assistance Equipment used: Rolling walker (2 wheels) Transfers: Sit to/from Stand Sit to Stand: Min assist                Ambulation/Gait Ambulation/Gait assistance: Min Chemical Engineer (Feet): 20 Feet Assistive device: Rolling walker (2 wheels) Gait Pattern/deviations: Step-through pattern, Antalgic Gait velocity: decreased     General Gait Details: cautious but stable; limited mostly by pain  Stairs            Wheelchair Mobility     Tilt Bed    Modified Rankin (Stroke Patients Only)       Balance Overall balance assessment: Mild deficits observed, not formally tested Sitting-balance support: Feet supported Sitting balance-Leahy Scale: Normal     Standing balance support: During functional activity, No upper extremity supported Standing balance-Leahy Scale: Good                               Pertinent Vitals/Pain Pain Assessment Pain Assessment: 0-10 Pain Score: 8  Pain Location: abdomen Pain Descriptors / Indicators: Aching Pain Intervention(s): Monitored during session, Limited activity within patient's tolerance    Home Living Family/patient expects to be discharged to:: Private residence Living  Arrangements: Spouse/significant other Available Help at Discharge: Available 24 hours/day Type of Home: House Home Access: Stairs to enter Entrance Stairs-Rails: Left Entrance Stairs-Number of Steps: 3   Home Layout: One level Home Equipment: Cane - single point;Wheelchair - Forensic Psychologist (2 wheels);Wheelchair - power;Shower  seat;Grab bars - tub/shower;Grab bars - toilet      Prior Function Prior Level of Function : Independent/Modified Independent;Driving                     Extremity/Trunk Assessment   Upper Extremity Assessment Upper Extremity Assessment: Generalized weakness    Lower Extremity Assessment Lower Extremity Assessment: Generalized weakness    Cervical / Trunk Assessment Cervical / Trunk Assessment: Normal  Communication   Communication Communication: No apparent difficulties    Cognition Arousal: Alert Behavior During Therapy: WFL for tasks assessed/performed   PT - Cognitive impairments: No apparent impairments                         Following commands: Intact       Cueing Cueing Techniques: Verbal cues     General Comments      Exercises     Assessment/Plan    PT Assessment Patient needs continued PT services  PT Problem List Decreased activity tolerance;Decreased mobility       PT Treatment Interventions DME instruction;Therapeutic activities;Gait training;Therapeutic exercise;Patient/family education;Stair training;Balance training;Functional mobility training;Neuromuscular re-education    PT Goals (Current goals can be found in the Care Plan section)  Acute Rehab PT Goals Patient Stated Goal: to go home PT Goal Formulation: With patient Time For Goal Achievement: 03/02/24 Potential to Achieve Goals: Good    Frequency Min 2X/week     Co-evaluation               AM-PAC PT 6 Clicks Mobility  Outcome Measure Help needed turning from your back to your side while in a flat bed without using bedrails?: None Help needed moving from lying on your back to sitting on the side of a flat bed without using bedrails?: None Help needed moving to and from a bed to a chair (including a wheelchair)?: None Help needed standing up from a chair using your arms (e.g., wheelchair or bedside chair)?: None Help needed to walk in hospital room?:  None Help needed climbing 3-5 steps with a railing? : None 6 Click Score: 24    End of Session Equipment Utilized During Treatment: Oxygen  Activity Tolerance: Patient tolerated treatment well;Patient limited by pain Patient left: in chair;with call bell/phone within reach;with chair alarm set Nurse Communication: Mobility status PT Visit Diagnosis: Other abnormalities of gait and mobility (R26.89)    Time: 9041-8984 PT Time Calculation (min) (ACUTE ONLY): 17 min   Charges:   PT Evaluation $PT Eval Low Complexity: 1 Low   PT General Charges $$ ACUTE PT VISIT: 1 Visit         Sherlean Lesches DPT, PT    Lonnie Schaefer 02/22/2024, 10:29 AM

## 2024-02-22 NOTE — Progress Notes (Signed)
" °  PROGRESS NOTE    Lonnie Schaefer  FMW:[id number] DOB: 04/10/1940 DOA: 02/14/2024 PCP: Samie Frederick, PA-C  146A/[id number]-AA  LOS: 8 days   Brief hospital course:  Lonnie Schaefer is an 84 year old male with COPD, CAD, inguinal hernia s/p repair and prior SBO, who presented to the emergency department with acute onset recurrent nausea and vomiting as well as diarrhea and abdominal pain.  Labs consistent with mild AKI, leukocytosis of 11.5.  CT abdomen pelvis with SBO, transition point in the distal jejunum.  Also revealed small fat-containing supraumbilical midline ventral hernia and bilateral inguinal hernias without bowel herniation.  Colonic diverticulosis without diverticulitis. Inguinal hernia was reduced by Dr. Viviann.  NG tube was inserted.  General surgery was consulted.  Patient has had gradual return of bowel function, even though his NGT output has been high.  NG tube clamped with minimal residual on 1/13, patient was started on liquids.  Assessment & Plan:    SBO Failed conservative mgmt. Ex lap 1/17 w/ lysis of adhesions, repair of two ventral hernias.  - continue tpn - diet per gen surg  Acute urinary retention Earlier today 750 ml - add flomax  - monitor - I/o cath prn   COPD, not currently in exacerbation Nocturnal hypoxia on 2L O2 nightly --cont bronchodilators, prn o2   CAD - Stable.     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  S/p radiation therapy - Following outpatient with ENT and oncology   Acquired hypothyroidism --cont Synthroid    Osteoporosis - Continue outpatient follow-up   DVT prophylaxis: Lovenox  SQ Code Status: DNR  Family Communication: daughter at bedside 1/18 Dispo:   The patient is from: home Anticipated d/c is to: home (no home health needs per pt) Anticipated d/c date is: to be determined   Subjective and Interval History:    Pain controlled. No nausea/vomiting. No bm or  flatus  Objective: Vitals:   02/22/24 0439 02/22/24 0500 02/22/24 0740 02/22/24 0814  BP: (!) 127/45  (!) 138/53   Pulse: 84  79   Resp: 19  19   Temp: 98 F (36.7 C)  98.2 F (36.8 C)   TempSrc:   Oral   SpO2: 97%  95% 94%  Weight:  64.7 kg    Height:        Intake/Output Summary (Last 24 hours) at 02/22/2024 1355 Last data filed at 02/22/2024 1130 Gross per 24 hour  Intake 1117.12 ml  Output 775 ml  Net 342.12 ml   Filed Weights   02/15/24 0642 02/21/24 0504 02/22/24 0500  Weight: 67.6 kg 64.9 kg 64.7 kg    Examination:   Constitutional: NAD, AAOx3 HEENT: conjunctivae and lids normal, EOMI Abdomen: soft, mildly tender, not distended, midline incision stapled c/d/i CV: No cyanosis.   RESP: normal respiratory effor =t Neuro: moving all 4   Data Reviewed: I have personally reviewed labs and imaging studies    Lonnie KATHEE Ban, MD [facility] If 7PM-7AM, please contact night-coverage 02/22/2024, 1:55 PM   "

## 2024-02-22 NOTE — Progress Notes (Signed)
 CC: s/p LOA extensive POD # 1 Subjective: Doing ok, no emesis, no NG Some urinary retention  Objective: Vital signs in last 24 hours: Temp:  [98 F (36.7 C)-98.5 F (36.9 C)] 98.3 F (36.8 C) (01/18 1530) Pulse Rate:  [79-86] 82 (01/18 1530) Resp:  [18-20] 20 (01/18 1530) BP: (127-165)/(45-88) 165/61 (01/18 1530) SpO2:  [94 %-97 %] 95 % (01/18 1530) FiO2 (%):  [28 %] 28 % (01/18 0814) Weight:  [64.7 kg] 64.7 kg (01/18 0500) Last BM Date : 02/21/24  Intake/Output from previous day: 01/17 0701 - 01/18 0700 In: 2019.2 [P.O.:120; I.V.:1599.2; IV Piggyback:300] Out: 975 [Urine:975] Intake/Output this shift: Total I/O In: 760.1 [I.V.:662.2; IV Piggyback:97.9] Out: 50 [Urine:50]  Physical exam:  NAD alert Abd: soft, appropriate incisional  tenderness , no rebound  Lab Results: CBC  Recent Labs    02/21/24 0458  WBC 5.1  HGB 12.3*  HCT 36.9*  PLT 216   BMET Recent Labs    02/21/24 0458 02/22/24 0456  NA 141 144  K 4.8 4.9  CL 105 107  CO2 30 29  GLUCOSE 114* 144*  BUN 15 15  CREATININE 0.83 0.96  CALCIUM  8.6* 8.7*   PT/INR No results for input(s): LABPROT, INR in the last 72 hours. ABG No results for input(s): PHART, HCO3 in the last 72 hours.  Invalid input(s): PCO2, PO2  Studies/Results: DG ABD ACUTE 2+V W 1V CHEST Result Date: 02/22/2024 EXAM: Upright and Supine XRAY Views of the Abdomen and 4 or 2 or 1 View(s) of the Chest 02/22/2024 07:05:29 AM COMPARISON: 02/19/2023 CLINICAL HISTORY: Ileus following gastrointestinal surgery (HCC) FINDINGS: LUNGS AND PLEURA: Patchy atelectasis at the left lung base. Blunting of left lateral costophrenic angle suggesting small effusion. HEART AND MEDIASTINUM: No acute abnormality of the cardiac and mediastinal silhouettes. BOWEL: There is gaseous distention of the stomach with mild gaseous distention of the large and small bowel loops. Previously noted pathologic dilatation of the small bowel loops has  resolved in the interval. PERITONEUM AND SOFT TISSUES: Free intraperitoneal gas. Midline skin staples. BONES: No acute osseous abnormality. VASCULATURE: Right PICC line tip is in the low SVC. Aortoiliac arterial calcifications. Aortic atherosclerosis. IMPRESSION: 1. Gaseous distention of the stomach with mild gaseous distention of large and small bowel loops, most consistent with postoperative ileus 2. Free intraperitoneal gas, presumably postoperative. Electronically signed by: Waddell Calk MD 02/22/2024 07:20 AM EST RP Workstation: [id number]    Anti-infectives: Anti-infectives (From admission, onward)    Start     Dose/Rate Route Frequency Ordered Stop   02/21/24 2200  cefoTEtan  (CEFOTAN ) 2 g in sodium chloride  0.9 % 100 mL IVPB        2 g 200 mL/hr over 30 Minutes Intravenous Every 12 hours 02/21/24 1419 02/22/24 1029   02/21/24 0900  cefoTEtan  (CEFOTAN ) 2 g in sodium chloride  0.9 % 100 mL IVPB        2 g 200 mL/hr over 30 Minutes Intravenous  Once 02/20/24 1558 02/21/24 1155   02/21/24 0600  ceFAZolin  (ANCEF ) IVPB 2g/100 mL premix  Status:  Discontinued        2 g 200 mL/hr over 30 Minutes Intravenous On call to O.R. 02/20/24 1224 02/20/24 1558       Assessment/Plan: Ileus Keep npo Hold off ng Mobilize Continue tpn    Laneta Luna, MD, [facility]  02/22/2024

## 2024-02-23 DIAGNOSIS — K56609 Unspecified intestinal obstruction, unspecified as to partial versus complete obstruction: Secondary | ICD-10-CM | POA: Diagnosis not present

## 2024-02-23 LAB — COMPREHENSIVE METABOLIC PANEL WITH GFR
ALT: 42 U/L (ref 0–44)
AST: 41 U/L (ref 15–41)
Albumin: 3.3 g/dL — ABNORMAL LOW (ref 3.5–5.0)
Alkaline Phosphatase: 46 U/L (ref 38–126)
Anion gap: 8 (ref 5–15)
BUN: 15 mg/dL (ref 8–23)
CO2: 28 mmol/L (ref 22–32)
Calcium: 8.7 mg/dL — ABNORMAL LOW (ref 8.9–10.3)
Chloride: 105 mmol/L (ref 98–111)
Creatinine, Ser: 0.69 mg/dL (ref 0.61–1.24)
GFR, Estimated: >60 mL/min
Glucose, Bld: 138 mg/dL — ABNORMAL HIGH (ref 70–99)
Potassium: 5.3 mmol/L — ABNORMAL HIGH (ref 3.5–5.1)
Sodium: 141 mmol/L (ref 135–145)
Total Bilirubin: 0.5 mg/dL (ref 0.0–1.2)
Total Protein: 6.1 g/dL — ABNORMAL LOW (ref 6.5–8.1)

## 2024-02-23 LAB — POTASSIUM: Potassium: 4.4 mmol/L (ref 3.5–5.1)

## 2024-02-23 LAB — GLUCOSE, CAPILLARY
Glucose-Capillary: 131 mg/dL — ABNORMAL HIGH (ref 70–99)
Glucose-Capillary: 131 mg/dL — ABNORMAL HIGH (ref 70–99)
Glucose-Capillary: 134 mg/dL — ABNORMAL HIGH (ref 70–99)

## 2024-02-23 LAB — TRIGLYCERIDES: Triglycerides: 97 mg/dL

## 2024-02-23 LAB — MAGNESIUM: Magnesium: 2.2 mg/dL (ref 1.7–2.4)

## 2024-02-23 LAB — PHOSPHORUS: Phosphorus: 2.5 mg/dL (ref 2.5–4.6)

## 2024-02-23 MED ORDER — TRAVASOL 10 % IV SOLN: INTRAVENOUS | Status: DC

## 2024-02-23 MED ORDER — TRAVASOL 10 % IV SOLN
INTRAVENOUS | Status: AC
  Filled 2024-02-23: qty 998.4

## 2024-02-23 NOTE — Plan of Care (Signed)

## 2024-02-23 NOTE — Progress Notes (Signed)
 Nutrition Follow-up  DOCUMENTATION CODES:   Severe malnutrition in context of chronic illness  INTERVENTION:   -TPN management per pharmacy -Recommend MVI daily -Recommend 100 mg thiamine  daily x 7 days -Recommend monitor Mg, K, and Phos and replete as needed  -RD will follow for diet advancement and add supplements as appropriate   NUTRITION DIAGNOSIS:   Severe Malnutrition related to chronic illness (COPD) as evidenced by severe fat depletion, severe muscle depletion.  Ongoing  GOAL:   Patient will meet greater than or equal to 90% of their needs  Met with TPN  MONITOR:   Diet advancement  REASON FOR ASSESSMENT:   Consult New TPN/TNA  ASSESSMENT:   84 year old male with medical history significant for COPD, coronary artery disease, inguinal hernia s/p repair and SBO, who presented to the emergency room with acute onset of recurrent nausea and vomiting with associated diarrhea and abdominal pain over the last couple of days PTA.  No bilious vomitus or hematemesis.  No melena or bright red bleeding per rectum.  He had subsequent significant diminished p.o. intake.  1/11- NGT placed for decompression 1/16- PICC placed, TPN 1/12- gastrograffin challenge 1/13- clamping trial, advanced to clear liquids, NGT removed 1/14- successful gastrograffin challenge 1/15- advanced to full liquid diet 1/16- KUB reveals more dilated loops of small bowel, NPO, PICC placed, TPN initiated 1/17- s/p laparotomy with extensive enterolysis, repair of ventral hernia x 2  Reviewed I/O's: +2 L x 24 hours and +6.7 L since admission   UOP: 400 ml x 24 hours   Reviewed weight history; weight has ranged from 64.7-67.6 kg over the past 8 days.   Per general surgery notes, surgical findings are as follows: 1) One band from omentum causing obstruction within proximal ileum, extensive adhesions from abdominal wall to bowel, bowel to bowel; 2) Periumbilical ventral hernia 2 cm; and 3) Epigastric  ventral hernia 1 cm. Per general surgery notes, likely with ileus (patient with distention, lack of flatus, and belch). Will hold off on diet advancement. Plan to continue to hold off on NGT placement unless emesis develops.   Patient is NPO and receiving TPN for sole source nutrition at this time. Patient receiving TPN at 80 ml/hr, which provides 1859 kcals and 100 grams protein, which provides 100% of estimated kcal and protein needs. Per pharmacy, plan to reduce K today.   Medications reviewed and include lovenox , ranexa , thiamine , 0.9% sodium chloride  infusion @ 75 ml/hr,   Labs reviewed: K: 5.3, Mg and Phos WDL. CBGS: 127-171 (inpatient orders for glycemic control are 0-6 units insulin  aspart every 8 hours).    Diet Order:   Diet Order             Diet NPO time specified Except for: Sips with Meds, Ice Chips  Diet effective now                   EDUCATION NEEDS:   Education needs have been addressed  Skin:  Skin Assessment: Skin Integrity Issues: Skin Integrity Issues:: Incisions Incisions: closed abdomen  Last BM:  02/21/24  Height:   Ht Readings from Last 1 Encounters:  02/15/24 5' 10 (1.778 m)    Weight:   Wt Readings from Last 1 Encounters:  02/23/24 66.6 kg    Ideal Body Weight:  75.5 kg  BMI:  Body mass index is 21.06 kg/m.  Estimated Nutritional Needs:   Kcal:  1800-2000  Protein:  90-105 grams  Fluid:  1.8-2.0 L  Margery ORN, RD, LDN, CDCES Registered Dietitian III Certified Diabetes Care and Education Specialist If unable to reach this RD, please use RD Inpatient group chat on secure chat between hours of 8am-4 pm daily

## 2024-02-23 NOTE — Consult Note (Addendum)
 PHARMACY - TOTAL PARENTERAL NUTRITION CONSULT NOTE   Indication: Small bowel obstruction  Patient Measurements: Height: 5' 10 (177.8 cm) Weight: 66.6 kg (146 lb 12.8 oz) IBW/kg (Calculated) : 73 TPN AdjBW (KG): 67.6 Body mass index is 21.06 kg/m.  Assessment:  84 yo male presented on 02/14/24 to ED with complaint of abdominal pain and constipation.  Imaging was concerning for SBO.  Patient had NGT placed for decompression.  Patient had had some improvement and was started on diet, however symptoms have persisted and patient not wanting to eat.  Per surgery note current plan is for patient to be NPO again and initiate TPN.  Repeat CT imaging ordered by surgery, surgical intervention may be needed.  Glucose / Insulin : No history of diabetes.  Will order sensitive SSI with q8 hours CBG monitoring with initiation of TPN. No given in last 24 hours Electrolytes: K 5.3 - hemolyzed, Mag 2.2, Phos 2.5 Renal: Scr < 1 Hepatic: LFTs WNL, TG 97 Intake / Output; MIVF:   Intake/Output Summary (Last 24 hours) at 02/23/2024 1006 Last data filed at 02/23/2024 9378 Gross per 24 hour  Intake 2423.8 ml  Output 400 ml  Net 2023.8 ml    GI Imaging: 1/15 KUB: Evidence of known partial small bowel obstruction with interval increased number of dilated small bowel loops. GI Surgeries / Procedures: Laparotomy with extensive enterolysis Repair of ventral hernia x 2  Central access:02/20/24 TPN start date: 02/20/24  Nutritional Goals: Goal TPN rate is 80 mL/hr (provides 99.8 g of protein and 1858 kcals per day)  RD Assessment: Estimated Needs Total Energy Estimated Needs: 1800-2000 Total Protein Estimated Needs: 90-105 grams Total Fluid Estimated Needs: 1.8-2.0 L  Current Nutrition:  NPO  Plan:  Continue TPN rate at 80 mL/hr at 1800 (Goal rate) Electrolytes in TPN: Na 54mEq/L, K 29mEq/L, Ca 57mEq/L, Mg 35mEq/L, and Phos 15mmol/L. Cl:Ac 1:1 K hemolyzed, will order STAT repeat this AM Will slightly  reduce potassium content today Add standard MVI and trace elements to TPN Initiate Sensitive q8h SSI and adjust as needed  No MIVF at this time. Thiamine  100 mg IV daily x 7 days per dietary recommendations All electrolytes WNL after Kphos given 1/17 Monitor TPN labs on Mon/Thurs, will initially monitor daily labs due to high refeeding risk  Thank you for involving pharmacy in this patient's care.   Damien Napoleon, PharmD Clinical Pharmacist 02/23/2024 10:27 AM

## 2024-02-23 NOTE — Progress Notes (Signed)
" °  PROGRESS NOTE    Lonnie Schaefer  FMW:[id number] DOB: [birth year] DOA: 02/14/2024 PCP: Lonnie Frederick, PA-C  146A/[id number]-AA  LOS: 9 days   Brief hospital course:  Lonnie Schaefer is an 84 year old male with COPD, CAD, inguinal hernia s/p repair and prior SBO, who presented to the emergency department with acute onset recurrent nausea and vomiting as well as diarrhea and abdominal pain.  Labs consistent with mild AKI, leukocytosis of 11.5.  CT abdomen pelvis with SBO, transition point in the distal jejunum.  Also revealed small fat-containing supraumbilical midline ventral hernia and bilateral inguinal hernias without bowel herniation.  Colonic diverticulosis without diverticulitis. Inguinal hernia was reduced by Dr. Viviann.  NG tube was inserted.  General surgery was consulted.  Patient has had gradual return of bowel function, even though his NGT output has been high.  NG tube clamped with minimal residual on 1/13, patient was started on liquids.  Assessment & Plan:    SBO Ileus Failed conservative mgmt. Ex lap 1/17 w/ lysis of adhesions, repair of two ventral hernias. Now with post op ileus. No nausea/vomiting - continue tpn - diet per gen surg, currently npo  Acute urinary retention Appears resolved. Required one I/o cath - continue flomax  for now   COPD, not currently in exacerbation Nocturnal hypoxia on 2L O2 nightly --cont bronchodilators, prn o2   CAD - Stable.     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  S/p radiation therapy - Following outpatient with ENT and oncology   Acquired hypothyroidism --cont Synthroid    Osteoporosis - Continue outpatient follow-up   DVT prophylaxis: Lovenox  SQ Code Status: DNR  Family Communication: daughter telephonically 1/19 Dispo:   The patient is from: home Anticipated d/c is to: home (no home health needs per pt) Anticipated d/c date is: to be determined   Subjective and  Interval History:    Pain controlled. No nausea/vomiting. No bm or flatus  Objective: Vitals:   02/23/24 0313 02/23/24 0335 02/23/24 0725 02/23/24 0743  BP: (!) 130/58   134/63  Pulse: 86   77  Resp: 20   17  Temp: 97.9 F (36.6 C)   97.7 F (36.5 C)  TempSrc:      SpO2: 93%  91% 94%  Weight:  66.6 kg    Height:        Intake/Output Summary (Last 24 hours) at 02/23/2024 1200 Last data filed at 02/23/2024 9378 Gross per 24 hour  Intake 2325.89 ml  Output 350 ml  Net 1975.89 ml   Filed Weights   02/21/24 0504 02/22/24 0500 02/23/24 0335  Weight: 64.9 kg 64.7 kg 66.6 kg    Examination:   Constitutional: NAD, AAOx3 HEENT: atraumatic normocephalic Abdomen: soft, mildly tender, mildly distended, midline incision stapled c/d/I. Hypoactive bowel sounds CV: No cyanosis.   RESP: normal respiratory effor   Neuro: moving all 4   Data Reviewed: I have personally reviewed labs and imaging studies    Lonnie KATHEE Ban, MD [facility] If 7PM-7AM, please contact night-coverage 02/23/2024, 12:00 PM   "

## 2024-02-23 NOTE — Plan of Care (Signed)
" °  Problem: Education: Goal: Knowledge of General Education information will improve Description: Including pain rating scale, medication(s)/side effects and non-pharmacologic comfort measures Outcome: Progressing   Problem: Coping: Goal: Level of anxiety will decrease Outcome: Progressing   Problem: Safety: Goal: Ability to remain free from injury will improve Outcome: Progressing   Problem: Neurological: Goal: Will regain or maintain usual level of consciousness Outcome: Progressing   "

## 2024-02-23 NOTE — Progress Notes (Signed)
 Brief Progress Note Patient reassessed this afternoon Had oozing from laparotomy today; controlled with pressure Scant ooze this afternoon, stopped with gentle pressure Otherwise abdominal exam unchanged; awaiting ROBF  Continue superficial dressings PRN Lovenox  stopped Will check morning CBC Otherwise no acute changes  -- Arthea Platt, PA-C [facility] 02/23/2024, 2:38 PM M-F: 7am - 4pm

## 2024-02-23 NOTE — Progress Notes (Signed)
 [facility] SURGICAL PROGRESS NOTE  Hospital Day(s): 9.   Post op day(s): 2 Days Post-Op.   Interval History:  Patient seen and examined No acute events or new complaints overnight.  Patient reports he is doing well No significant abdominal pain He is still distended No fever, chills, nausea, emesis He does report frequent belching No new labs this morning No NGT No flatus  Ambulating   Vital signs in last 24 hours: [min-max] current  Temp:  [97.7 F (36.5 C)-98.3 F (36.8 C)] 97.7 F (36.5 C) (01/19 0743) Pulse Rate:  [77-86] 77 (01/19 0743) Resp:  [17-20] 17 (01/19 0743) BP: (130-165)/(58-74) 134/63 (01/19 0743) SpO2:  [91 %-95 %] 94 % (01/19 0743) FiO2 (%):  [28 %] 28 % (01/18 2102) Weight:  [66.6 kg] 66.6 kg (01/19 0335)     Height: 5' 10 (177.8 cm) Weight: 66.6 kg BMI (Calculated): 21.06   Intake/Output last 2 shifts:  01/18 0701 - 01/19 0700 In: 2423.8 [I.V.:2325.9; IV Piggyback:97.9] Out: 400 [Urine:400]   Physical Exam:  Constitutional: alert, cooperative and no distress  Respiratory: breathing non-labored at rest  Cardiovascular: regular rate and sinus rhythm  Gastrointestinal: soft, non-tender, he is distended Integumentary: Laparotomy is CDI with staples, honeycomb removed, no erythema   Labs:     Latest Ref Rng & Units 02/21/2024    4:58 AM 02/17/2024   10:34 PM 02/16/2024    9:24 AM  CBC  WBC 4.0 - 10.5 K/uL 5.1  9.3  10.2   Hemoglobin 13.0 - 17.0 g/dL 87.6  86.9  85.9   Hematocrit 39.0 - 52.0 % 36.9  38.8  41.8   Platelets 150 - 400 K/uL 216  204  221       Latest Ref Rng & Units 02/22/2024    4:56 AM 02/21/2024    4:58 AM 02/20/2024    8:30 AM  CMP  Glucose 70 - 99 mg/dL 855  885  93   BUN 8 - 23 mg/dL 15  15  18    Creatinine 0.61 - 1.24 mg/dL 9.03  9.16  9.11   Sodium 135 - 145 mmol/L 144  141  140   Potassium 3.5 - 5.1 mmol/L 4.9  4.8  4.0   Chloride 98 - 111 mmol/L 107  105  103   CO2 22 - 32 mmol/L 29  30  26     Calcium  8.9 - 10.3 mg/dL 8.7  8.6  8.5   Total Protein 6.5 - 8.1 g/dL   5.8   Total Bilirubin 0.0 - 1.2 mg/dL   0.9   Alkaline Phos 38 - 126 U/L   44   AST 15 - 41 U/L   24   ALT 0 - 44 U/L   35      Imaging studies: No new pertinent imaging studies   Assessment/Plan:  84 y.o. male with post-operative ileus 2 Days Post-Op s/p exploratory laparotomy and LOA for SBO   - Given his distension, lack of flatus, and belching, will hold off on formal diet just yet. Likely with ileus. Okay for water, ice chips, sips with medications  - Continue TPN; monitor electrolytes - I think we can continue to hold off on NGT unless emesis develops   - Monitor abdominal examination; on-going bowel function   - Pain control prn; antiemetics prn - Mobilize - Further management per primary service; we will follow    All of the above findings and recommendations were discussed with the patient, and  the medical team, and all of patient's questions were answered to his expressed satisfaction.  -- Arthea Platt, PA-C [facility] 02/23/2024, 9:41 AM M-F: 7am - 4pm

## 2024-02-24 ENCOUNTER — Inpatient Hospital Stay

## 2024-02-24 LAB — GLUCOSE, CAPILLARY
Glucose-Capillary: 127 mg/dL — ABNORMAL HIGH (ref 70–99)
Glucose-Capillary: 134 mg/dL — ABNORMAL HIGH (ref 70–99)
Glucose-Capillary: 138 mg/dL — ABNORMAL HIGH (ref 70–99)

## 2024-02-24 LAB — CBC
HCT: 36.5 % — ABNORMAL LOW (ref 39.0–52.0)
Hemoglobin: 12.1 g/dL — ABNORMAL LOW (ref 13.0–17.0)
MCH: 31.6 pg (ref 26.0–34.0)
MCHC: 33.2 g/dL (ref 30.0–36.0)
MCV: 95.3 fL (ref 80.0–100.0)
Platelets: 259 K/uL (ref 150–400)
RBC: 3.83 MIL/uL — ABNORMAL LOW (ref 4.22–5.81)
RDW: 14.2 % (ref 11.5–15.5)
WBC: 9.5 K/uL (ref 4.0–10.5)
nRBC: 0 % (ref 0.0–0.2)

## 2024-02-24 MED ORDER — TRAVASOL 10 % IV SOLN
INTRAVENOUS | Status: AC
  Filled 2024-02-24: qty 998.4

## 2024-02-24 NOTE — Progress Notes (Signed)
 [facility] SURGICAL PROGRESS NOTE  Hospital Day(s): 10.   Post op day(s): 3 Days Post-Op.   Interval History:  Patient seen and examined No acute events or new complaints overnight.  Patient reports he feels good this morning No significant abdominal pain No fever, chills, nausea, emesis He is burping still  He remains without leukocytosis; WBC 9.5K Hgb to 12.1; stable KUB with gaseous distension of the stomach, air in colon Does report flatus Ambulating   Vital signs in last 24 hours: [min-max] current  Temp:  [97.6 F (36.4 C)-98.9 F (37.2 C)] 97.9 F (36.6 C) (01/20 0724) Pulse Rate:  [89-94] 93 (01/20 0400) Resp:  [18-20] 20 (01/20 0724) BP: (104-122)/(69-83) 116/83 (01/20 0724) SpO2:  [93 %-96 %] 96 % (01/20 0724) Weight:  [69.9 kg] 69.9 kg (01/20 0500)     Height: 5' 10 (177.8 cm) Weight: 69.9 kg BMI (Calculated): 22.11   Intake/Output last 2 shifts:  01/19 0701 - 01/20 0700 In: 951.5 [I.V.:951.5] Out: 500 [Urine:500]   Physical Exam:  Constitutional: alert, cooperative and no distress  Respiratory: breathing non-labored at rest  Cardiovascular: regular rate and sinus rhythm  Gastrointestinal: soft, non-tender, he is distended Integumentary: Laparotomy is CDI with staples, previous oozing to central portion seems to have stopped, dressing changed   Labs:     Latest Ref Rng & Units 02/24/2024    3:02 AM 02/21/2024    4:58 AM 02/17/2024   10:34 PM  CBC  WBC 4.0 - 10.5 K/uL 9.5  5.1  9.3   Hemoglobin 13.0 - 17.0 g/dL 87.8  87.6  86.9   Hematocrit 39.0 - 52.0 % 36.5  36.9  38.8   Platelets 150 - 400 K/uL 259  216  204       Latest Ref Rng & Units 02/23/2024   11:31 AM 02/23/2024    8:37 AM 02/22/2024    4:56 AM  CMP  Glucose 70 - 99 mg/dL  861  855   BUN 8 - 23 mg/dL  15  15   Creatinine 9.38 - 1.24 mg/dL  9.30  9.03   Sodium 864 - 145 mmol/L  141  144   Potassium 3.5 - 5.1 mmol/L 4.4  5.3  4.9   Chloride 98 - 111 mmol/L  105  107    CO2 22 - 32 mmol/L  28  29   Calcium  8.9 - 10.3 mg/dL  8.7  8.7   Total Protein 6.5 - 8.1 g/dL  6.1    Total Bilirubin 0.0 - 1.2 mg/dL  0.5    Alkaline Phos 38 - 126 U/L  46    AST 15 - 41 U/L  41    ALT 0 - 44 U/L  42       Imaging studies:   KUB (02/24/2024) personally reviewed with gaseous distension of the stomach, air in colon, and radiologist report reviewed:  IMPRESSION: 1. Slight interval worsening of multiple air-filled dilated central small bowel loops measuring up to 5.1 cm in diameter. Findings may be due to postoperative ileus versus persistent partial small bowel obstruction. 2. Less free subdiaphragmatic air from patient's recent exploratory laparotomy/lysis of adhesions.   Assessment/Plan:  84 y.o. male with post-operative ileus 3 Days Post-Op s/p exploratory laparotomy and LOA for SBO   - KUB certainly still consistent with ileus. Will leave NPO, okay for ice chips, sips of water  - Continue TPN; monitor electrolytes - I think we can continue to hold off on NGT  unless emesis develops   - Monitor abdominal examination; on-going bowel function   - Continue superficial dressings to midline wound; change PRN  - Pain control prn; antiemetics prn - Mobilize; doing well   - Hold DVT prophylaxis another 24 hours given bleeding from midline wound; Hgb stable  - Further management per primary service; we will follow    All of the above findings and recommendations were discussed with the patient, and the medical team, and all of patient's questions were answered to his expressed satisfaction.  -- Arthea Platt, PA-C [facility] 02/24/2024, 8:30 AM M-F: 7am - 4pm

## 2024-02-24 NOTE — Progress Notes (Signed)
 PT Cancellation Note  Patient Details Name: Lonnie Schaefer MRN: [id number] DOB: 07-08-1940   Cancelled Treatment:     Pt had just returned to bed upon arrival and politely declined PT session. Will re-attempt at a later date per POC.    Darice JAYSON Bohr 02/24/2024, 3:53 PM

## 2024-02-24 NOTE — Plan of Care (Signed)
" °  Problem: Education: Goal: Knowledge of General Education information will improve Description: Including pain rating scale, medication(s)/side effects and non-pharmacologic comfort measures Outcome: Progressing   Problem: Health Behavior/Discharge Planning: Goal: Ability to manage health-related needs will improve Outcome: Progressing   Problem: Clinical Measurements: Goal: Ability to maintain clinical measurements within normal limits will improve Outcome: Progressing Goal: Will remain Lonnie Schaefer from infection Outcome: Progressing Goal: Diagnostic test results will improve Outcome: Progressing Goal: Respiratory complications will improve Outcome: Progressing Goal: Cardiovascular complication will be avoided Outcome: Progressing   Problem: Activity: Goal: Risk for activity intolerance will decrease Outcome: Progressing   Problem: Nutrition: Goal: Adequate nutrition will be maintained Outcome: Progressing   Problem: Coping: Goal: Level of anxiety will decrease Outcome: Progressing   Problem: Elimination: Goal: Will not experience complications related to bowel motility Outcome: Progressing Note: Pt passing gas and mobilizing as tolerated, no BM yet. Goal: Will not experience complications related to urinary retention Outcome: Progressing Note: Urinary retention appears to be resolved.   Problem: Pain Managment: Goal: General experience of comfort will improve and/or be controlled Outcome: Progressing   Problem: Safety: Goal: Ability to remain Lonnie Schaefer from injury will improve Outcome: Progressing   Problem: Skin Integrity: Goal: Risk for impaired skin integrity will decrease Outcome: Progressing   Problem: Education: Goal: Knowledge of the prescribed therapeutic regimen will improve Outcome: Progressing   Problem: Bowel/Gastric: Goal: Gastrointestinal status for postoperative course will improve Outcome: Progressing   Problem: Cardiac: Goal: Ability to maintain  an adequate cardiac output Outcome: Progressing Goal: Will show no evidence of cardiac arrhythmias Outcome: Progressing   Problem: Nutritional: Goal: Will attain and maintain optimal nutritional status Outcome: Progressing   Problem: Neurological: Goal: Will regain or maintain usual level of consciousness Outcome: Progressing   Problem: Clinical Measurements: Goal: Ability to maintain clinical measurements within normal limits Outcome: Progressing Goal: Postoperative complications will be avoided or minimized Outcome: Progressing   Problem: Respiratory: Goal: Will regain and/or maintain adequate ventilation Outcome: Progressing Goal: Respiratory status will improve Outcome: Progressing   Problem: Skin Integrity: Goal: Demonstrates signs of wound healing without infection Outcome: Progressing   Problem: Urinary Elimination: Goal: Will remain Lonnie Schaefer from infection Outcome: Progressing Goal: Ability to achieve and maintain adequate urine output Outcome: Progressing   "

## 2024-02-24 NOTE — Consult Note (Signed)
 PHARMACY - TOTAL PARENTERAL NUTRITION CONSULT NOTE   Indication: Small bowel obstruction  Patient Measurements: Height: 5' 10 (177.8 cm) Weight: 69.9 kg (154 lb 1.6 oz) IBW/kg (Calculated) : 73 TPN AdjBW (KG): 67.6 Body mass index is 22.11 kg/m.  Assessment:  84 yo male presented on 02/14/24 to ED with complaint of abdominal pain and constipation.  Imaging was concerning for SBO.  Patient had NGT placed for decompression.  Patient had had some improvement and was started on diet, however symptoms have persisted and patient not wanting to eat.  Per surgery note current plan is for patient to be NPO again and initiate TPN.  Repeat CT imaging ordered by surgery, surgical intervention may be needed.  Glucose / Insulin : No history of diabetes.  Will order sensitive SSI with q8 hours CBG monitoring with initiation of TPN. None given in last 24 hours Electrolytes: K 4.4, Mag 2.2, Phos 2.5 Renal: Scr < 1 Hepatic: LFTs WNL, TG 97 Intake / Output; MIVF:   Intake/Output Summary (Last 24 hours) at 02/24/2024 9074 Last data filed at 02/24/2024 0300 Gross per 24 hour  Intake 594.2 ml  Output 500 ml  Net 94.2 ml    GI Imaging: 1/15 KUB: Evidence of known partial small bowel obstruction with interval increased number of dilated small bowel loops. 1/18 KUB: Gaseous distention of the stomach with mild gaseous distention of large and small bowel loops, most consistent with postoperative ileus 1/20 KUB: Slight interval worsening of multiple air-filled dilated central small bowel loops consistent with  postoperative ileus versus persistent partial small bowel obstruction.  GI Surgeries / Procedures: Laparotomy with extensive enterolysis Repair of ventral hernia x 2  Central access:02/20/24 TPN start date: 02/20/24  Nutritional Goals: Goal TPN rate is 80 mL/hr (provides 99.8 g of protein and 1858 kcals per day)  RD Assessment: Estimated Needs Total Energy Estimated Needs: 1800-2000 Total Protein  Estimated Needs: 90-105 grams Total Fluid Estimated Needs: 1.8-2.0 L  Current Nutrition:  NPO  Plan:  Imaging consistent with persistent ileus - Surgery to hold off on advancing diet today Continue TPN rate at 80 mL/hr at 1800 (Goal rate) Electrolytes in TPN: Na 47mEq/L, K 50mEq/L, Ca 32mEq/L, Mg 88mEq/L, and Phos 15mmol/L. Cl:Ac 1:1 Add standard MVI and trace elements to TPN Initiate Sensitive q8h SSI and adjust as needed  No MIVF at this time. Thiamine  100 mg IV daily x 7 days per dietary recommendations All electrolytes WNL after Kphos given 1/17 Monitor TPN labs on Mon/Thurs, will initially monitor daily labs due to high refeeding risk  Thank you for involving pharmacy in this patient's care.   Damien Napoleon, PharmD Clinical Pharmacist 02/24/2024 9:25 AM

## 2024-02-24 NOTE — Progress Notes (Addendum)
" °  PROGRESS NOTE    Lonnie Schaefer  FMW:[id number] DOB: [birth year] DOA: 02/14/2024 PCP: Samie Frederick, PA-C  [id number]  LOS: 10 days   Brief hospital course:  Lonnie Schaefer is an 84 year old male with COPD, CAD, inguinal hernia s/p repair and prior SBO, who presented to the emergency department with acute onset recurrent nausea and vomiting as well as diarrhea and abdominal pain.  Labs consistent with mild AKI, leukocytosis of 11.5.  CT abdomen pelvis with SBO, transition point in the distal jejunum.  Also revealed small fat-containing supraumbilical midline ventral hernia and bilateral inguinal hernias without bowel herniation.  Colonic diverticulosis without diverticulitis. Inguinal hernia was reduced by Dr. Viviann.  NG tube was inserted.  General surgery was consulted.  Patient has had gradual return of bowel function, even though his NGT output has been high.  NG tube clamped with minimal residual on 1/13, patient was started on liquids.  Assessment & Plan:    SBO Ileus Failed conservative mgmt. Ex lap 1/17 w/ lysis of adhesions, repair of two ventral hernias. Now with post op ileus. No nausea/vomiting. Some flatus today, which is an improvement. No BM. KUB from this morning shows ongoing ileus vs obstruction. - continue tpn - diet and management per gen surg, currently npo  Acute urinary retention Appears resolved. 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  S/p radiation therapy - Following outpatient with ENT and oncology   Acquired hypothyroidism --cont Synthroid    Osteoporosis - Continue outpatient follow-up   DVT prophylaxis: Lovenox  SQ Code Status: DNR  Family Communication: daughter telephonically 1/20 Dispo:   The  patient is from: home Anticipated d/c is to: home (no home health needs per pt) Anticipated d/c date is: to be determined   Subjective and Interval History:    Pain controlled. No nausea/vomiting. Some flatus  Objective: Vitals:   02/23/24 2024 02/24/24 0400 02/24/24 0500 02/24/24 0724  BP:  104/70  116/83  Pulse:  93    Resp:  18  20  Temp:  98.9 F (37.2 C)  97.9 F (36.6 C)  TempSrc:    Oral  SpO2: 93% 95%  96%  Weight:   69.9 kg   Height:        Intake/Output Summary (Last 24 hours) at 02/24/2024 1456 Last data filed at 02/24/2024 0300 Gross per 24 hour  Intake --  Output 500 ml  Net -500 ml   Filed Weights   02/22/24 0500 02/23/24 0335 02/24/24 0500  Weight: 64.7 kg 66.6 kg 69.9 kg    Examination:   Constitutional: NAD, AAOx3 HEENT: atraumatic normocephalic Abdomen: soft, mildly tender, mildly distended, midline incision stapled c/d/I. Hypoactive bowel sounds but improved from yesterday CV: No cyanosis.   RESP: normal respiratory effort  Neuro: moving all 4   Data Reviewed: I have personally reviewed labs and imaging studies    Lonnie KATHEE Ban, MD [facility] If 7PM-7AM, please contact night-coverage 02/24/2024, 2:56 PM   "

## 2024-02-24 NOTE — Plan of Care (Signed)

## 2024-02-25 ENCOUNTER — Inpatient Hospital Stay

## 2024-02-25 DIAGNOSIS — K56609 Unspecified intestinal obstruction, unspecified as to partial versus complete obstruction: Secondary | ICD-10-CM | POA: Diagnosis not present

## 2024-02-25 LAB — COMPREHENSIVE METABOLIC PANEL WITH GFR
ALT: 29 U/L (ref 0–44)
AST: 23 U/L (ref 15–41)
Albumin: 3.3 g/dL — ABNORMAL LOW (ref 3.5–5.0)
Alkaline Phosphatase: 45 U/L (ref 38–126)
Anion gap: 7 (ref 5–15)
BUN: 22 mg/dL (ref 8–23)
CO2: 27 mmol/L (ref 22–32)
Calcium: 9.1 mg/dL (ref 8.9–10.3)
Chloride: 104 mmol/L (ref 98–111)
Creatinine, Ser: 0.63 mg/dL (ref 0.61–1.24)
GFR, Estimated: >60 mL/min
Glucose, Bld: 127 mg/dL — ABNORMAL HIGH (ref 70–99)
Potassium: 5 mmol/L (ref 3.5–5.1)
Sodium: 138 mmol/L (ref 135–145)
Total Bilirubin: 0.6 mg/dL (ref 0.0–1.2)
Total Protein: 6.1 g/dL — ABNORMAL LOW (ref 6.5–8.1)

## 2024-02-25 LAB — GLUCOSE, CAPILLARY
Glucose-Capillary: 125 mg/dL — ABNORMAL HIGH (ref 70–99)
Glucose-Capillary: 128 mg/dL — ABNORMAL HIGH (ref 70–99)
Glucose-Capillary: 140 mg/dL — ABNORMAL HIGH (ref 70–99)
Glucose-Capillary: 144 mg/dL — ABNORMAL HIGH (ref 70–99)

## 2024-02-25 LAB — MAGNESIUM: Magnesium: 2.3 mg/dL (ref 1.7–2.4)

## 2024-02-25 LAB — PHOSPHORUS: Phosphorus: 3.2 mg/dL (ref 2.5–4.6)

## 2024-02-25 MED ORDER — ACETAMINOPHEN 500 MG PO TABS
1000.0000 mg | ORAL_TABLET | Freq: Four times a day (QID) | ORAL | Status: DC
  Administered 2024-02-25 – 2024-02-27 (×8): 1000 mg via ORAL
  Filled 2024-02-25 (×9): qty 2

## 2024-02-25 MED ORDER — TRAVASOL 10 % IV SOLN
INTRAVENOUS | Status: AC
  Filled 2024-02-25: qty 998.4

## 2024-02-25 NOTE — Progress Notes (Signed)
 " PROGRESS NOTE    Lonnie Schaefer  FMW:[id number] DOB: 01-Nov-1940 DOA: 02/14/2024 PCP: Samie Frederick, PA-C  Chief Complaint  Patient presents with   Abdominal Pain   Diarrhea   Emesis    Hospital Course:  Lonnie Schaefer is an 84 year old male with COPD, CAD, inguinal hernia s/p repair and prior SBO, who presented to the emergency department with acute onset recurrent nausea and vomiting as well as diarrhea and abdominal pain. Labs consistent with mild AKI, leukocytosis of 11.5. CT abdomen pelvis with SBO, transition point in the distal jejunum. Also revealed small fat-containing supraumbilical midline ventral hernia and bilateral inguinal hernias without bowel herniation. Colonic diverticulosis without diverticulitis. Inguinal hernia was reduced by Dr. Viviann. NG tube was inserted. General surgery was consulted. Patient has had gradual return of bowel function, and NG tube was removed on 1/13.  Ultimately SBO recurred.  He underwent ex lap on 1/17 with lysis of adhesions, repair of 2 ventral hernias.  Postoperative course further complicated by postop ileus.  Subjective: No acute events overnight. On evaluation today patient is nauseated. Zofran  is helping. Has had belching but not flatulence.    Objective: Vitals:   02/24/24 1957 02/24/24 2027 02/25/24 0339 02/25/24 0749  BP:  127/71 126/73 (!) 161/78  Pulse:  87 87 93  Resp:  18 18 15   Temp:  98.3 F (36.8 C) 97.8 F (36.6 C) (!) 97.5 F (36.4 C)  TempSrc:   Oral   SpO2: 93% 93% 93% 92%  Weight:      Height:       No intake or output data in the 24 hours ending 02/25/24 0809 Filed Weights   02/22/24 0500 02/23/24 0335 02/24/24 0500  Weight: 64.7 kg 66.6 kg 69.9 kg    Examination: General exam: Appears calm and comfortable, NAD  Respiratory system: No work of breathing, symmetric chest wall expansion Cardiovascular system: S1 & S2 heard, RRR.  Gastrointestinal system: Abdomen is nondistended, soft and nontender. BS  present Neuro: Alert and oriented.  Assessment & Plan:  Principal Problem:   Small bowel obstruction (HCC) Active Problems:   Nausea vomiting and diarrhea   SBO (small bowel obstruction) (HCC)   Chronic obstructive pulmonary disease (COPD) (HCC)   Coronary artery disease   Hypothyroidism   Protein-calorie malnutrition, severe    SBO Postop ileus - Failed conservative management - Ex lap 1/17: Lysis of adhesion, repair of 2 ventral hernias - Now with postop ileus - Having some flatus - Continue TPN - Diet advancement per general surgery.  Currently n.p.o. okay for ice chips and sips of water - Currently holding DVT prophylaxis given bleeding from midline wound-plan to restart tomorrow 1/22 if dizziness stopped. 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  DVT prophylaxis: SCDs for now. Likely Resume AC tomorrow   Code Status: Do not attempt resuscitation (DNR) PRE-ARREST INTERVENTIONS DESIRED Disposition: Inpatient pending clinical improvement  Consultants:  Treatment Team:  Consulting Physician: Marinda Jayson KIDD, MD  Procedures:  Ex lap 1/17: Lysis of adhesion, repair of 2 ventral hernias  Antimicrobials:  Anti-infectives (From admission, onward)    Start     Dose/Rate Route Frequency Ordered Stop   02/21/24 2200  cefoTEtan  (CEFOTAN ) 2 g in sodium chloride  0.9 % 100 mL  IVPB        2 g 200 mL/hr over 30 Minutes Intravenous Every 12 hours 02/21/24 1419 02/22/24 1029   02/21/24 0900  cefoTEtan  (CEFOTAN ) 2 g in sodium chloride  0.9 % 100 mL IVPB        2 g 200 mL/hr over 30 Minutes Intravenous  Once 02/20/24 1558 02/21/24 1155   02/21/24 0600  ceFAZolin  (ANCEF ) IVPB 2g/100 mL premix  Status:  Discontinued        2 g 200 mL/hr over 30 Minutes Intravenous On call to O.R. 02/20/24 1224 02/20/24 1558       Data Reviewed: I have personally reviewed following labs and imaging studies CBC: Recent Labs  Lab 02/21/24 0458 02/24/24 0302  WBC 5.1 9.5  HGB 12.3* 12.1*  HCT 36.9* 36.5*  MCV 95.3 95.3  PLT 216 259   Basic Metabolic Panel: Recent Labs  Lab 02/19/24 0515 02/20/24 0830 02/21/24 0458 02/22/24 0456 02/23/24 0837 02/23/24 1131  NA 141 140 141 144 141  --   K 4.4 4.0 4.8 4.9 5.3* 4.4  CL 102 103 105 107 105  --   CO2 30 26 30 29 28   --   GLUCOSE 98 93 114* 144* 138*  --   BUN 19 18 15 15 15   --   CREATININE 0.95 0.88 0.83 0.96 0.69  --   CALCIUM  8.7* 8.5* 8.6* 8.7* 8.7*  --   MG 2.2 2.1 2.2 2.2 2.2  --   PHOS 2.7 2.8 2.3* 3.0 2.5  --    GFR: Estimated Creatinine Clearance: 71.6 mL/min (by C-G formula based on SCr of 0.69 mg/dL). Liver Function Tests: Recent Labs  Lab 02/20/24 0830 02/23/24 0837  AST 24 41  ALT 35 42  ALKPHOS 44 46  BILITOT 0.9 0.5  PROT 5.8* 6.1*  ALBUMIN 3.4* 3.3*   CBG: Recent Labs  Lab 02/24/24 0022 02/24/24 0802 02/24/24 1644 02/25/24 0103 02/25/24 0752  GLUCAP 134* 127* 138* 125* 128*    No results found for this or any previous visit (from the past 240 hours).   Radiology Studies: DG Abd 2 Views Result Date: 02/24/2024 CLINICAL DATA:  Ileus. Post exploratory laparotomy with lysis of adhesions 02/21/2024. EXAM: ABDOMEN - 2 VIEW COMPARISON:  02/22/2024 FINDINGS: Gaseous distention of the stomach. Less right-sided free subdiaphragmatic air from patient's recent exploratory laparotomy/lysis of  adhesions. Slight interval worsening of multiple air-filled dilated central small bowel loops measuring up to 5.1 cm in diameter. Air is present over the: As air is seen over the rectum. Skin staples vertically over the abdomen/pelvis. Remainder of the exam is unchanged. IMPRESSION: 1. Slight interval worsening of multiple air-filled dilated central small bowel loops measuring up to 5.1 cm in diameter. Findings may be due to postoperative ileus versus persistent partial small bowel obstruction. 2. Less free subdiaphragmatic air from patient's recent exploratory laparotomy/lysis of adhesions. Electronically Signed   By: Toribio Agreste M.D.   On: 02/24/2024 08:09    Scheduled Meds:  arformoterol   15 mcg Nebulization BID   And   umeclidinium bromide   1 puff Inhalation Daily   Chlorhexidine  Gluconate Cloth  6 each  Topical Daily   insulin  aspart  0-6 Units Subcutaneous Q8H   levothyroxine   75 mcg Oral Q0600   ranolazine   500 mg Oral BID   sodium chloride  flush  10-40 mL Intracatheter Q12H   tamsulosin   0.4 mg Oral Daily   thiamine  (VITAMIN B1) injection  100 mg Intravenous Q24H   Continuous Infusions:  TPN ADULT (ION) 80 mL/hr at 02/24/24 1806     LOS: 11 days  MDM: Patient is high risk for one or more organ failure.  They necessitate ongoing hospitalization for continued IV therapies and subsequent lab monitoring. Total time spent interpreting labs and vitals, reviewing the medical record, coordinating care amongst consultants and care team members, directly assessing and discussing care with the patient and/or family: 55 min  Lashana Spang, DO [facility]  To contact the attending physician between 7A-7P please use Epic Chat. To contact the covering physician during after hours 7P-7A, please review Amion.  02/25/2024, 8:09 AM   *This document has been created with the assistance of dictation software. Please excuse typographical errors. *   "

## 2024-02-25 NOTE — Progress Notes (Signed)
 Nutrition Follow-up  DOCUMENTATION CODES:   Severe malnutrition in context of chronic illness  INTERVENTION:   -TPN management per pharmacy -Recommend MVI daily -Recommend 100 mg thiamine  daily x 7 days -Recommend monitor Mg, K, and Phos and replete as needed  -RD will follow for diet advancement and add supplements as appropriate   NUTRITION DIAGNOSIS:   Severe Malnutrition related to chronic illness (COPD) as evidenced by severe fat depletion, severe muscle depletion.  Ongoing  GOAL:   Patient will meet greater than or equal to 90% of their needs  Met with TPN  MONITOR:   Diet advancement  REASON FOR ASSESSMENT:   Consult New TPN/TNA  ASSESSMENT:   84 year old male with medical history significant for COPD, coronary artery disease, inguinal hernia s/p repair and SBO, who presented to the emergency room with acute onset of recurrent nausea and vomiting with associated diarrhea and abdominal pain over the last couple of days PTA.  No bilious vomitus or hematemesis.  No melena or bright red bleeding per rectum.  He had subsequent significant diminished p.o. intake.  1/11- NGT placed for decompression 1/16- PICC placed, TPN 1/12- gastrograffin challenge 1/13- clamping trial, advanced to clear liquids, NGT removed 1/14- successful gastrograffin challenge 1/15- advanced to full liquid diet 1/16- KUB reveals more dilated loops of small bowel, NPO, PICC placed, TPN initiated 1/17- s/p laparotomy with extensive enterolysis, repair of ventral hernia x 2 1/20- KUB revealed dilated stomach, slightly distended, passing small amounts of flatus, keep NPO 1/21- KUB consistent with ileus, continue NPO  Reviewed I/O's: +7.2 L since admission  Per general surgery notes, surgical findings are as follows: 1) One band from omentum causing obstruction within proximal ileum, extensive adhesions from abdominal wall to bowel, bowel to bowel; 2) Periumbilical ventral hernia 2 cm; and 3)  Epigastric ventral hernia 1 cm. Per general surgery notes, Patient with ileus; consider repeating CT of abdomen and pelvis to evaluate possible intra-abdominal etiology. Plan to continue NPO and to hold off on NGT placement unless emesis develops. Patient no significant flatus or BM.    Patient is NPO and receiving TPN for sole source nutrition at this time. Patient receiving TPN at 80 ml/hr, which provides 1859 kcals and 100 grams protein, which provides 100% of estimated kcal and protein needs.  Reviewed weight history; weight has ranfed from 64.9-69.9 kg since admission.  64.9-69.9 since admits  Medications reviewed and include thiamine .   Labs reviewed: K, Mg, and Phos WDL. CBGS: 125-138 (inpatient orders for glycemic control are 0-6 units insulin  aspart every 8 hours).    Diet Order:   Diet Order             Diet NPO time specified Except for: Sips with Meds, Ice Chips  Diet effective now                   EDUCATION NEEDS:   Education needs have been addressed  Skin:  Skin Assessment: Skin Integrity Issues: Skin Integrity Issues:: Incisions Incisions: closed abdomen  Last BM:  02/21/24  Height:   Ht Readings from Last 1 Encounters:  02/15/24 5' 10 (1.778 m)    Weight:   Wt Readings from Last 1 Encounters:  02/24/24 69.9 kg    Ideal Body Weight:  75.5 kg  BMI:  Body mass index is 22.11 kg/m.  Estimated Nutritional Needs:   Kcal:  1800-2000  Protein:  90-105 grams  Fluid:  1.8-2.0 L    Margery ORN, RD, LDN,  CDCES Registered Dietitian III Certified Diabetes Care and Education Specialist If unable to reach this RD, please use RD Inpatient group chat on secure chat between hours of 8am-4 pm daily

## 2024-02-25 NOTE — Progress Notes (Signed)
 [facility] SURGICAL PROGRESS NOTE  Hospital Day(s): 11.   Post op day(s): 4 Days Post-Op.   Interval History:  Patient seen and examined No acute events or new complaints overnight.  Patient reports he is doing the same Abdomen is still distended and sore No fever, chills, nausea, emesis  Labs are pending this AM UO - unmeasured x3 KUB this AM still with dilated small bowel and stomach No significant flatus, no BM NPO + TPN  Vital signs in last 24 hours: [min-max] current  Temp:  [97.8 F (36.6 C)-98.3 F (36.8 C)] 97.8 F (36.6 C) (01/21 0339) Pulse Rate:  [87-92] 87 (01/21 0339) Resp:  [18] 18 (01/21 0339) BP: (117-127)/(70-73) 126/73 (01/21 0339) SpO2:  [92 %-93 %] 93 % (01/21 0339)     Height: 5' 10 (177.8 cm) Weight: 69.9 kg BMI (Calculated): 22.11   Intake/Output last 2 shifts:  No intake/output data recorded.   Physical Exam:  Constitutional: alert, cooperative and no distress  Respiratory: breathing non-labored at rest  Cardiovascular: regular rate and sinus rhythm  Gastrointestinal: soft, non-tender, he is distended Integumentary: Laparotomy is CDI with staples, previous oozing to central portion seems to have stopped, dressing changed   Labs:     Latest Ref Rng & Units 02/24/2024    3:02 AM 02/21/2024    4:58 AM 02/17/2024   10:34 PM  CBC  WBC 4.0 - 10.5 K/uL 9.5  5.1  9.3   Hemoglobin 13.0 - 17.0 g/dL 87.8  87.6  86.9   Hematocrit 39.0 - 52.0 % 36.5  36.9  38.8   Platelets 150 - 400 K/uL 259  216  204       Latest Ref Rng & Units 02/23/2024   11:31 AM 02/23/2024    8:37 AM 02/22/2024    4:56 AM  CMP  Glucose 70 - 99 mg/dL  861  855   BUN 8 - 23 mg/dL  15  15   Creatinine 9.38 - 1.24 mg/dL  9.30  9.03   Sodium 864 - 145 mmol/L  141  144   Potassium 3.5 - 5.1 mmol/L 4.4  5.3  4.9   Chloride 98 - 111 mmol/L  105  107   CO2 22 - 32 mmol/L  28  29   Calcium  8.9 - 10.3 mg/dL  8.7  8.7   Total Protein 6.5 - 8.1 g/dL  6.1    Total  Bilirubin 0.0 - 1.2 mg/dL  0.5    Alkaline Phos 38 - 126 U/L  46    AST 15 - 41 U/L  41    ALT 0 - 44 U/L  42       Imaging studies:   KUB (02/25/2024) personally reviewed still with gaseous distension of the stomach, air in colon, and radiologist report reviewed:  IMPRESSION: 1. No acute cardiopulmonary process. 2. Persistent small bowel dilatation. 3. Trace right subdiaphragmatic pneumoperitoneum, likely related to recent surgery.   Assessment/Plan:  84 y.o. male with post-operative ileus 4 Days Post-Op s/p exploratory laparotomy and LOA for SBO   - KUB certainly still consistent with ileus. Will leave NPO, okay for ice chips, sips of water. He may also try hard candies, gum  - We can consider repeating CT Abdomen/Pelvis to ensure no missed intra-abdominal etiology if fails to progress, although clinical suspicion is low   - Continue TPN; at goal; monitor electrolytes - I think we can continue to hold off on NGT unless emesis develops - He  wishes to avoid this if possible   - Monitor abdominal examination; on-going bowel function   - Continue superficial dressings to midline wound; change PRN  - Pain control prn; antiemetics prn - Mobilize; doing well   - Hold DVT prophylaxis another 24 hours given bleeding from midline wound; okay to restart tomorrow (01/22) if oozing remains stopped  - Further management per primary service; we will follow    All of the above findings and recommendations were discussed with the patient, and the medical team, and all of patient's questions were answered to his expressed satisfaction.  -- Arthea Platt, PA-C [facility] 02/25/2024, 7:44 AM M-F: 7am - 4pm

## 2024-02-25 NOTE — Plan of Care (Signed)
" °  Problem: Education: Goal: Knowledge of General Education information will improve Description: Including pain rating scale, medication(s)/side effects and non-pharmacologic comfort measures Outcome: Progressing   Problem: Health Behavior/Discharge Planning: Goal: Ability to manage health-related needs will improve Outcome: Progressing   Problem: Clinical Measurements: Goal: Ability to maintain clinical measurements within normal limits will improve Outcome: Progressing Goal: Will remain Latisa Belay from infection Outcome: Progressing Goal: Diagnostic test results will improve Outcome: Progressing Goal: Respiratory complications will improve Outcome: Progressing Goal: Cardiovascular complication will be avoided Outcome: Progressing   Problem: Nutrition: Goal: Adequate nutrition will be maintained Outcome: Progressing   Problem: Coping: Goal: Level of anxiety will decrease Outcome: Progressing   Problem: Elimination: Goal: Will not experience complications related to urinary retention Outcome: Progressing   Problem: Pain Managment: Goal: General experience of comfort will improve and/or be controlled Outcome: Progressing   Problem: Safety: Goal: Ability to remain Nayely Dingus from injury will improve Outcome: Progressing   Problem: Skin Integrity: Goal: Risk for impaired skin integrity will decrease Outcome: Progressing   Problem: Education: Goal: Knowledge of the prescribed therapeutic regimen will improve Outcome: Progressing   Problem: Cardiac: Goal: Ability to maintain an adequate cardiac output Outcome: Progressing Goal: Will show no evidence of cardiac arrhythmias Outcome: Progressing   Problem: Nutritional: Goal: Will attain and maintain optimal nutritional status Outcome: Progressing   Problem: Neurological: Goal: Will regain or maintain usual level of consciousness Outcome: Progressing   Problem: Clinical Measurements: Goal: Ability to maintain clinical  measurements within normal limits Outcome: Progressing Goal: Postoperative complications will be avoided or minimized Outcome: Progressing   Problem: Respiratory: Goal: Will regain and/or maintain adequate ventilation Outcome: Progressing Goal: Respiratory status will improve Outcome: Progressing   Problem: Skin Integrity: Goal: Demonstrates signs of wound healing without infection Outcome: Progressing   Problem: Urinary Elimination: Goal: Will remain Julieta Rogalski from infection Outcome: Progressing Goal: Ability to achieve and maintain adequate urine output Outcome: Progressing   Problem: Activity: Goal: Risk for activity intolerance will decrease Outcome: Not Progressing Note: Pt did not have the energy to ambulate or work with PT today.   Problem: Elimination: Goal: Will not experience complications related to bowel motility Outcome: Not Progressing Note: No BM this shift.   Problem: Bowel/Gastric: Goal: Gastrointestinal status for postoperative course will improve Outcome: Not Progressing Note: No BM this shift.   "

## 2024-02-25 NOTE — Plan of Care (Signed)

## 2024-02-25 NOTE — Consult Note (Signed)
 PHARMACY - TOTAL PARENTERAL NUTRITION CONSULT NOTE   Indication: Small bowel obstruction  Patient Measurements: Height: 5' 10 (177.8 cm) Weight: 69.9 kg (154 lb 1.6 oz) IBW/kg (Calculated) : 73 TPN AdjBW (KG): 67.6 Body mass index is 22.11 kg/m.  Assessment:  84 yo male presented on 02/14/24 to ED with complaint of abdominal pain and constipation.  Imaging was concerning for SBO.  Patient had NGT placed for decompression.  Patient had had some improvement and was started on diet, however symptoms have persisted and patient not wanting to eat.  Per surgery note current plan is for patient to be NPO again and initiate TPN.  Repeat CT imaging ordered by surgery, surgical intervention may be needed.  Glucose / Insulin : No history of diabetes.  Will order sensitive SSI with q8 hours CBG monitoring with initiation of TPN. None given in last 24 hours Electrolytes: K 5.0, Mag 2.3, Phos 3.2 Renal: Scr < 1 Hepatic: LFTs WNL, TG 97 Intake / Output; MIVF:  No intake or output data in the 24 hours ending 02/25/24 0855   GI Imaging: 1/15 KUB: Evidence of known partial small bowel obstruction with interval increased number of dilated small bowel loops. 1/18 KUB: Gaseous distention of the stomach with mild gaseous distention of large and small bowel loops, most consistent with postoperative ileus 1/20 KUB: Slight interval worsening of multiple air-filled dilated central small bowel loops consistent with  postoperative ileus versus persistent partial small bowel obstruction. 1/21 KUB: Persistent small bowel dilatation  GI Surgeries / Procedures: Laparotomy with extensive enterolysis Repair of ventral hernia x 2  Central access:02/20/24 TPN start date: 02/20/24  Nutritional Goals: Goal TPN rate is 80 mL/hr (provides 99.8 g of protein and 1858 kcals per day)  RD Assessment: Estimated Needs Total Energy Estimated Needs: 1800-2000 Total Protein Estimated Needs: 90-105 grams Total Fluid Estimated  Needs: 1.8-2.0 L  Current Nutrition:  NPO  Plan:  Imaging consistent with persistent ileus - Surgery to hold off on advancing diet today  Continue TPN rate at 80 mL/hr at 1800 (Goal rate) Electrolytes in TPN: Na 59mEq/L, K 86mEq/L, Ca 10mEq/L, Mg 23mEq/L, and Phos 15mmol/L. Cl:Ac 1:1 K mildly elevated at 5.0 - will slightly reduce potassium content in TPN and recheck labs in AM Add standard MVI and trace elements to TPN Initiate Sensitive q8h SSI and adjust as needed  No MIVF at this time. Thiamine  100 mg IV daily x 7 days per dietary recommendations Monitor TPN labs on Mon/Thurs, will initially monitor daily labs due to high refeeding risk  Thank you for involving pharmacy in this patient's care.   Damien Napoleon, PharmD Clinical Pharmacist 02/25/2024 8:55 AM

## 2024-02-25 NOTE — Progress Notes (Signed)
 PT Cancellation Note  Patient Details Name: Lonnie Schaefer MRN: [id number] DOB: 1940/05/13   Cancelled Treatment:     Pt receiving care from nursing this am, returned and pt with c/o nausea, received Zofran , yet continues to c/o nausea and SOB with minimal exertion. Pt requested to return tomorrow. Continue PT per POC.    Darice JAYSON Bohr 02/25/2024, 1:06 PM

## 2024-02-26 ENCOUNTER — Inpatient Hospital Stay

## 2024-02-26 DIAGNOSIS — K56609 Unspecified intestinal obstruction, unspecified as to partial versus complete obstruction: Secondary | ICD-10-CM | POA: Diagnosis not present

## 2024-02-26 LAB — COMPREHENSIVE METABOLIC PANEL WITH GFR
ALT: 44 U/L (ref 0–44)
AST: 38 U/L (ref 15–41)
Albumin: 3.4 g/dL — ABNORMAL LOW (ref 3.5–5.0)
Alkaline Phosphatase: 51 U/L (ref 38–126)
Anion gap: 7 (ref 5–15)
BUN: 24 mg/dL — ABNORMAL HIGH (ref 8–23)
CO2: 27 mmol/L (ref 22–32)
Calcium: 8.8 mg/dL — ABNORMAL LOW (ref 8.9–10.3)
Chloride: 104 mmol/L (ref 98–111)
Creatinine, Ser: 0.66 mg/dL (ref 0.61–1.24)
GFR, Estimated: >60 mL/min
Glucose, Bld: 131 mg/dL — ABNORMAL HIGH (ref 70–99)
Potassium: 4.8 mmol/L (ref 3.5–5.1)
Sodium: 138 mmol/L (ref 135–145)
Total Bilirubin: 0.6 mg/dL (ref 0.0–1.2)
Total Protein: 5.9 g/dL — ABNORMAL LOW (ref 6.5–8.1)

## 2024-02-26 LAB — GLUCOSE, CAPILLARY
Glucose-Capillary: 124 mg/dL — ABNORMAL HIGH (ref 70–99)
Glucose-Capillary: 131 mg/dL — ABNORMAL HIGH (ref 70–99)
Glucose-Capillary: 136 mg/dL — ABNORMAL HIGH (ref 70–99)
Glucose-Capillary: 139 mg/dL — ABNORMAL HIGH (ref 70–99)

## 2024-02-26 LAB — PHOSPHORUS: Phosphorus: 3.2 mg/dL (ref 2.5–4.6)

## 2024-02-26 LAB — MAGNESIUM: Magnesium: 2.3 mg/dL (ref 1.7–2.4)

## 2024-02-26 MED ORDER — ENOXAPARIN SODIUM 40 MG/0.4ML IJ SOSY
40.0000 mg | PREFILLED_SYRINGE | INTRAMUSCULAR | Status: DC
  Administered 2024-02-26: 40 mg via SUBCUTANEOUS
  Filled 2024-02-26: qty 0.4

## 2024-02-26 MED ORDER — TRAVASOL 10 % IV SOLN
INTRAVENOUS | Status: AC
  Filled 2024-02-26: qty 998.4

## 2024-02-26 MED ORDER — IOHEXOL 300 MG/ML  SOLN
100.0000 mL | Freq: Once | INTRAMUSCULAR | Status: AC | PRN
  Administered 2024-02-26: 100 mL via INTRAVENOUS

## 2024-02-26 MED ORDER — IOHEXOL 9 MG/ML PO SOLN
500.0000 mL | ORAL | Status: AC
  Administered 2024-02-26: 500 mL via ORAL

## 2024-02-26 NOTE — Plan of Care (Signed)
   Problem: Education: Goal: Knowledge of General Education information will improve Description Including pain rating scale, medication(s)/side effects and non-pharmacologic comfort measures Outcome: Progressing

## 2024-02-26 NOTE — Progress Notes (Signed)
 " PROGRESS NOTE    Lonnie Schaefer  FMW:[id number] DOB: [birth year] DOA: 02/14/2024 PCP: Samie Frederick, PA-C  Chief Complaint  Patient presents with   Abdominal Pain   Diarrhea   Emesis    Hospital Course:  Lonnie Schaefer is an 83 year old male with COPD, CAD, inguinal hernia s/p repair and prior SBO, who presented to the emergency department with acute onset recurrent nausea and vomiting as well as diarrhea and abdominal pain. Labs consistent with mild AKI, leukocytosis of 11.5. CT abdomen pelvis with SBO, transition point in the distal jejunum. Also revealed small fat-containing supraumbilical midline ventral hernia and bilateral inguinal hernias without bowel herniation. Colonic diverticulosis without diverticulitis. Inguinal hernia was reduced by Dr. Viviann. NG tube was inserted. General surgery was consulted. Patient has had gradual return of bowel function, and NG tube was removed on 1/13.  Ultimately SBO recurred.  He underwent ex lap on 1/17 with lysis of adhesions, repair of 2 ventral hernias.  Postoperative course further complicated by postop ileus.  Subjective: Overnight.  On evaluation this morning patient appears to be out of breath.  He denies any new pain or new problems but agrees that he feels more short of breath than normal.  He continues to endorse flatulence, no bowel movements.  No nausea.   Objective: Vitals:   02/25/24 2106 02/26/24 0345 02/26/24 0736 02/26/24 1246  BP: 122/73 125/72 (!) 127/55 (!) 156/67  Pulse: 92 92 (!) 101 94  Resp: 20 19 19  (!) 21  Temp: 97.9 F (36.6 C) 97.8 F (36.6 C) 97.8 F (36.6 C) 98.1 F (36.7 C)  TempSrc:      SpO2: 95% 92% 95% 95%  Weight:      Height:        Intake/Output Summary (Last 24 hours) at 02/26/2024 1352 Last data filed at 02/26/2024 0600 Gross per 24 hour  Intake 2846.91 ml  Output --  Net 2846.91 ml   Filed Weights   02/22/24 0500 02/23/24 0335 02/24/24 0500  Weight: 64.7 kg 66.6 kg 69.9 kg     Examination: General exam: Appears calm and comfortable, NAD  Respiratory system: Tachypneic, symmetric chest wall expansion, no wheezing, CTAB Cardiovascular system: S1 & S2 heard, RRR.  Gastrointestinal system: Abdomen is nondistended, soft and nontender. BS present Neuro: Alert and oriented.  Assessment & Plan:  Principal Problem:   Small bowel obstruction (HCC) Active Problems:   Nausea vomiting and diarrhea   SBO (small bowel obstruction) (HCC)   Chronic obstructive pulmonary disease (COPD) (HCC)   Coronary artery disease   Hypothyroidism   Protein-calorie malnutrition, severe    SBO Postop ileus - Failed conservative management - Ex lap 1/17: Lysis of adhesion, repair of 2 ventral hernias - Now with postop ileus - Having some flatus - Repeat CT scan ordered today, will monitor for results - Continue with TPN for now - Diet advancement per general surgery.  Currently n.p.o. okay for ice chips and sips of water - Has been holding DVT prophylaxis given bleeding from midline wound.  Will restart today - Superficial dressing changes with RN  Acute urinary retention - Resolved now - Resolved, required 1 In-and Out cath - Continue Flomax  - Continue to monitor for retention.  Bladder scan as needed  COPD, not currently in exacerbation Nocturnal hypoxia on 2 L O2 nightly - Continue with home dose bronchodilators - Patient does appear to have some increasing tachypnea today though no wheezing.  Have discussed with RN to administer additional breathing  treatment.  Will continue to monitor closely.  CXR as needed.  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  Continue to work with PT/OT as much as able.  Would likely benefit from SNF at DC  Body mass index is 22.11 kg/m.  DVT prophylaxis: Lovenox    Code Status: Do not attempt resuscitation (DNR) PRE-ARREST INTERVENTIONS DESIRED Disposition: Inpatient pending clinical improvement  Consultants:  Treatment Team:  Consulting Physician: Marinda Jayson KIDD, MD  Procedures:  Ex lap 1/17: Lysis of adhesion, repair of 2 ventral hernias  Antimicrobials:  Anti-infectives (From admission, onward)    Start     Dose/Rate Route Frequency Ordered Stop   02/21/24 2200  cefoTEtan  (CEFOTAN ) 2 g in sodium chloride  0.9 % 100 mL IVPB        2 g 200 mL/hr over 30 Minutes Intravenous Every 12 hours 02/21/24 1419 02/22/24 1029   02/21/24 0900  cefoTEtan  (CEFOTAN ) 2 g in sodium chloride  0.9 % 100 mL IVPB        2 g 200 mL/hr over 30 Minutes Intravenous  Once 02/20/24 1558 02/21/24 1155   02/21/24 0600  ceFAZolin  (ANCEF ) IVPB 2g/100 mL premix  Status:  Discontinued        2 g 200 mL/hr over 30 Minutes Intravenous On call to O.R. 02/20/24 1224 02/20/24 1558       Data Reviewed: I have personally reviewed following labs and imaging studies CBC: Recent Labs  Lab 02/21/24 0458 02/24/24 0302  WBC 5.1 9.5  HGB 12.3* 12.1*  HCT 36.9* 36.5*  MCV 95.3 95.3  PLT 216 259   Basic Metabolic Panel: Recent Labs  Lab 02/21/24 0458 02/22/24 0456 02/23/24 0837 02/23/24 1131 02/25/24 0739 02/26/24 0449  NA 141 144 141  --  138 138  K 4.8 4.9 5.3* 4.4 5.0 4.8  CL 105 107 105  --  104 104  CO2 30 29 28   --  27 27  GLUCOSE 114* 144* 138*  --  127* 131*  BUN 15 15 15   --  22 24*  CREATININE 0.83 0.96 0.69  --  0.63 0.66  CALCIUM  8.6* 8.7* 8.7*  --  9.1 8.8*  MG 2.2 2.2 2.2  --  2.3 2.3  PHOS 2.3* 3.0 2.5  --  3.2 3.2   GFR: Estimated Creatinine Clearance: 71.6 mL/min (by C-G formula based on SCr of 0.66 mg/dL). Liver Function Tests: Recent Labs   Lab 02/20/24 0830 02/23/24 0837 02/25/24 0739 02/26/24 0449  AST 24 41 23 38  ALT 35 42 29 44  ALKPHOS 44 46 45 51  BILITOT 0.9 0.5 0.6 0.6  PROT 5.8* 6.1* 6.1* 5.9*  ALBUMIN 3.4* 3.3* 3.3* 3.4*   CBG: Recent Labs  Lab 02/25/24 0752 02/25/24 1646 02/25/24 2328 02/26/24 0218 02/26/24 0927  GLUCAP 128* 140* 144* 139* 136*    No results found for this or any previous visit (from the past 240 hours).   Radiology Studies: DG ABD ACUTE 2+V W 1V CHEST Result Date: 02/26/2024 CLINICAL DATA:  Ileus. EXAM: DG ABDOMEN ACUTE WITH 1 VIEW CHEST COMPARISON:  02/25/2024 FINDINGS: Low lung volumes. Subtle nodular density in the lateral right lung  base is new in the interval, likely atelectatic given the rapid appearance. Similar streaky basilar atelectasis in both lungs. Right PICC line tip overlies the distal SVC level. AP upright chest x-ray again shows lucency under the right hemidiaphragm compatible with intraperitoneal free air. There is marked gaseous distention of the stomach. Supine imaging shows diffuse gaseous small bowel dilatation. Small bowel loop in the midline upper abdomen measures up to 5.6 cm diameter. There is probably some colonic gas in the hepatic flexure IMPRESSION: 1. Persistent intraperitoneal free air, not unexpected 5 days after surgery. 2. Marked gaseous distention of the stomach with diffuse gaseous small bowel dilatation. Imaging features compatible with persistent ileus or obstruction. 3. Low lung volumes with bibasilar atelectasis. 4. New subtle nodular density in the lateral right lung base, likely atelectatic given the rapid appearance. Attention on follow-up recommended. Electronically Signed   By: Camellia Candle M.D.   On: 02/26/2024 07:53   DG ABD ACUTE 2+V W 1V CHEST Result Date: 02/25/2024 CLINICAL DATA:  Ileus. EXAM: DG ABDOMEN ACUTE WITH 1 VIEW CHEST COMPARISON:  Abdominal radiograph dated 02/24/2024. FINDINGS: Right-sided Port-A-Cath with tip at the cavoatrial  junction. There is shallow inspiration with bibasilar atelectasis. No focal consolidation, pleural effusion or pneumothorax. The cardiac silhouette is within limits. Diffuse air distention of the small bowel measure up to 4.5 cm relatively similar to prior radiograph. Air is noted in the colon. There is a trace right subdiaphragmatic pneumoperitoneum, likely related to recent surgery. Degenerative changes of the spine. No acute osseous pathology. Midline anterior abdominal wall cutaneous staples. IMPRESSION: 1. No acute cardiopulmonary process. 2. Persistent small bowel dilatation. 3. Trace right subdiaphragmatic pneumoperitoneum, likely related to recent surgery. Electronically Signed   By: Vanetta Chou M.D.   On: 02/25/2024 08:30    Scheduled Meds:  acetaminophen   1,000 mg Oral Q6H   arformoterol   15 mcg Nebulization BID   And   umeclidinium bromide   1 puff Inhalation Daily   Chlorhexidine  Gluconate Cloth  6 each Topical Daily   insulin  aspart  0-6 Units Subcutaneous Q8H   iohexol   500 mL Oral Q1H   levothyroxine   75 mcg Oral Q0600   ranolazine   500 mg Oral BID   sodium chloride  flush  10-40 mL Intracatheter Q12H   tamsulosin   0.4 mg Oral Daily   thiamine  (VITAMIN B1) injection  100 mg Intravenous Q24H   Continuous Infusions:  TPN ADULT (ION) 80 mL/hr at 02/25/24 1754   TPN ADULT (ION)       LOS: 12 days  MDM: Patient is high risk for one or more organ failure.  They necessitate ongoing hospitalization for continued IV therapies and subsequent lab monitoring. Total time spent interpreting labs and vitals, reviewing the medical record, coordinating care amongst consultants and care team members, directly assessing and discussing care with the patient and/or family: 55 min  Shalunda Lindh, DO [facility]  To contact the attending physician between 7A-7P please use Epic Chat. To contact the covering physician during after hours 7P-7A, please review Amion.  02/26/2024, 1:52 PM    *This document has been created with the assistance of dictation software. Please excuse typographical errors. *   "

## 2024-02-26 NOTE — TOC Progression Note (Signed)
 Transition of Care [facility]) - Progression Note    Patient Details  Name: Lonnie Schaefer MRN: [id number] Date of Birth: 1941-01-09  [facility] CM/SW Contact  Daved JONETTA Hamilton, RN Phone Number: 02/26/2024, 8:08 PM  Clinical Narrative:     TOC chart review.   Patient unable to participate in PT since 02/22/24, reasons include polite refusal, nausea, and SOB. PT recommendations at discharge are: Follow physician's recommendations for discharge plan and follow up therapies.    TOC will continue to follow for discharge planning needs.                     Expected Discharge Plan and Services                                               Social Drivers of Health (SDOH) Interventions SDOH Screenings   Food Insecurity: No Food Insecurity (02/15/2024)  Housing: Low Risk (02/15/2024)  Transportation Needs: No Transportation Needs (02/15/2024)  Utilities: Not At Risk (02/15/2024)  Financial Resource Strain: Low Risk  (12/09/2023)   Received from [facility]  Physical Activity: Insufficiently Active (05/20/2023)   Received from [facility]  Social Connections: Moderately Isolated (02/15/2024)  Stress: No Stress Concern Present (05/20/2023)   Received from [facility]  Tobacco Use: Medium Risk (02/14/2024)    Readmission Risk Interventions     No data to display

## 2024-02-26 NOTE — Progress Notes (Signed)
 PT Cancellation Note  Patient Details Name: MARTIN BELLING MRN: [id number] DOB: [birth year]   Cancelled Treatment:     Therapist in this am, pt wheezing on 2L O2 with c/o not feeling well and abdominal distention. BP 152/63 (90), HR 47, SpO2 96%. Pt unable to tolerate mobilizing OOB. Modified session consisting of repositioning, education, and minimal tolerance to LE ROM. Pt awaiting Abd CT today. Discussed pt's significant decline with MD and Nursing. Pt was ambulating 353ft less than a week ago on RA with SpO2 >90%. Will continue to assess/attempt PT treatment per POC and pt tolerance.    Darice JAYSON Bohr 02/26/2024, 1:04 PM

## 2024-02-26 NOTE — Consult Note (Signed)
 PHARMACY - TOTAL PARENTERAL NUTRITION CONSULT NOTE   Indication: Small bowel obstruction  Patient Measurements: Height: 5' 10 (177.8 cm) Weight: 69.9 kg (154 lb 1.6 oz) IBW/kg (Calculated) : 73 TPN AdjBW (KG): 67.6 Body mass index is 22.11 kg/m.  Assessment:  84 yo male presented on 02/14/24 to ED with complaint of abdominal pain and constipation.  Imaging was concerning for SBO.  Patient had NGT placed for decompression.  Patient had had some improvement and was started on diet, however symptoms have persisted and patient not wanting to eat.  Per surgery note current plan is for patient to be NPO again and initiate TPN.  Repeat CT imaging ordered by surgery, surgical intervention may be needed.  Glucose / Insulin : No history of diabetes.  Will order sensitive SSI with q8 hours CBG monitoring with initiation of TPN. None given in last 24 hours Electrolytes: K 4.8, Mag 2.3, Phos 3.2 Renal: Scr < 1 Hepatic: LFTs WNL, TG 97 Intake / Output; MIVF:   Intake/Output Summary (Last 24 hours) at 02/26/2024 1011 Last data filed at 02/26/2024 0600 Gross per 24 hour  Intake 2846.91 ml  Output --  Net 2846.91 ml     GI Imaging: 1/15 KUB: Evidence of known partial small bowel obstruction with interval increased number of dilated small bowel loops. 1/18 KUB: Gaseous distention of the stomach with mild gaseous distention of large and small bowel loops, most consistent with postoperative ileus 1/20 KUB: Slight interval worsening of multiple air-filled dilated central small bowel loops consistent with  postoperative ileus versus persistent partial small bowel obstruction. 1/21 KUB: Persistent small bowel dilatation 1/22 KUB: Marked gaseous distention of the stomach with diffuse gaseous small bowel dilatation. Imaging features compatible with persistent ileus or obstruction.   GI Surgeries / Procedures: Laparotomy with extensive enterolysis Repair of ventral hernia x 2  Central access:02/20/24 TPN  start date: 02/20/24  Nutritional Goals: Goal TPN rate is 80 mL/hr (provides 99.8 g of protein and 1858 kcals per day)  RD Assessment: Estimated Needs Total Energy Estimated Needs: 1800-2000 Total Protein Estimated Needs: 90-105 grams Total Fluid Estimated Needs: 1.8-2.0 L  Current Nutrition:  NPO  Plan:  Imaging consistent with persistent ileus - Surgery to hold off on advancing diet today Will f/u on CT abd imaging Continue TPN rate at 80 mL/hr at 1800 (Goal rate) Electrolytes in TPN: Na 4mEq/L, K 53mEq/L, Ca 7mEq/L, Mg 80mEq/L, and Phos 15mmol/L. Cl:Ac 1:1 Add standard MVI and trace elements to TPN Initiate Sensitive q8h SSI and adjust as needed  No MIVF at this time. Thiamine  100 mg IV daily x 7 days per dietary recommendations Monitor TPN labs on Mon/Thurs, will initially monitor daily labs due to high refeeding risk  Thank you for involving pharmacy in this patient's care.   Damien Napoleon, PharmD Clinical Pharmacist 02/26/2024 10:11 AM

## 2024-02-26 NOTE — Progress Notes (Addendum)
 [facility] SURGICAL PROGRESS NOTE  Hospital Day(s): 12.   Post op day(s): 5 Days Post-Op.   Interval History:  Patient seen and examined No acute events or new complaints overnight.  Patient reports he feels about the same Still distended, sore Intermittent nausea, no emesis Labs are reassuring He continues to pass flatus  Did have BM recorded but he notes these are quite small  Vital signs in last 24 hours: [min-max] current  Temp:  [97.8 F (36.6 C)-98.1 F (36.7 C)] 97.8 F (36.6 C) (01/22 0736) Pulse Rate:  [88-101] 101 (01/22 0736) Resp:  [16-20] 19 (01/22 0736) BP: (122-127)/(55-73) 127/55 (01/22 0736) SpO2:  [92 %-95 %] 95 % (01/22 0736)     Height: 5' 10 (177.8 cm) Weight: 69.9 kg BMI (Calculated): 22.11   Intake/Output last 2 shifts:  01/21 0701 - 01/22 0700 In: 2846.9 [I.V.:2846.9] Out: -    Physical Exam:  Constitutional: alert, cooperative and no distress  Respiratory: breathing non-labored at rest  Cardiovascular: regular rate and sinus rhythm  Gastrointestinal: soft, non-tender, he is distended Integumentary: Laparotomy is CDI with staples, previous oozing to central portion seems to have stopped, dressing changed   Labs:     Latest Ref Rng & Units 02/24/2024    3:02 AM 02/21/2024    4:58 AM 02/17/2024   10:34 PM  CBC  WBC 4.0 - 10.5 K/uL 9.5  5.1  9.3   Hemoglobin 13.0 - 17.0 g/dL 87.8  87.6  86.9   Hematocrit 39.0 - 52.0 % 36.5  36.9  38.8   Platelets 150 - 400 K/uL 259  216  204       Latest Ref Rng & Units 02/26/2024    4:49 AM 02/25/2024    7:39 AM 02/23/2024   11:31 AM  CMP  Glucose 70 - 99 mg/dL 868  872    BUN 8 - 23 mg/dL 24  22    Creatinine 9.38 - 1.24 mg/dL 9.33  9.36    Sodium 864 - 145 mmol/L 138  138    Potassium 3.5 - 5.1 mmol/L 4.8  5.0  4.4   Chloride 98 - 111 mmol/L 104  104    CO2 22 - 32 mmol/L 27  27    Calcium  8.9 - 10.3 mg/dL 8.8  9.1    Total Protein 6.5 - 8.1 g/dL 5.9  6.1    Total Bilirubin 0.0 -  1.2 mg/dL 0.6  0.6    Alkaline Phos 38 - 126 U/L 51  45    AST 15 - 41 U/L 38  23    ALT 0 - 44 U/L 44  29       Imaging studies:   KUB (02/26/2024) personally reviewed with continue dilation of stomach and small bowel, and radiologist report reviewed:  IMPRESSION: 1. Persistent intraperitoneal free air, not unexpected 5 days after surgery. 2. Marked gaseous distention of the stomach with diffuse gaseous small bowel dilatation. Imaging features compatible with persistent ileus or obstruction. 3. Low lung volumes with bibasilar atelectasis. 4. New subtle nodular density in the lateral right lung base, likely atelectatic given the rapid appearance. Attention on follow-up recommended.   Assessment/Plan:  84 y.o. male with post-operative ileus 5 Days Post-Op s/p exploratory laparotomy and LOA for SBO   - Although he has endorsed flatus in last 24 hours, he continues to be distended, nauseous, and KUB still with dilation of stomach and small bowel. I will go ahead and get CT Abdomen/Pelvis  this morning to reassess intra-abdominal process and ensure nothing missed. We will attempt PO contrast; however, anticipate patient will not be able to tolerate this. If he cannot, okay to do IV contrast only.    - Will leave NPO, okay for ice chips, sips of water. He may also try hard candies, gum  - Continue TPN; at goal; monitor electrolytes - I think we can continue to hold off on NGT unless emesis develops - He wishes to avoid this if possible; however, I think he may benefit from this if we fail to progress given massive distension of stomach on KUB   - Monitor abdominal examination; on-going bowel function   - Continue superficial dressings to midline wound; change PRN  - Pain control prn; antiemetics prn - Mobilize; doing well   - Okay to resume DVT prophylaxis - Further management per primary service; we will follow    All of the above findings and recommendations were discussed with the  patient, and the medical team, and all of patient's questions were answered to his expressed satisfaction.  -- Arthea Platt, PA-C [facility] 02/26/2024, 9:17 AM M-F: 7am - 4pm

## 2024-02-27 ENCOUNTER — Inpatient Hospital Stay

## 2024-02-27 DIAGNOSIS — K56609 Unspecified intestinal obstruction, unspecified as to partial versus complete obstruction: Secondary | ICD-10-CM | POA: Diagnosis not present

## 2024-02-27 LAB — BLOOD GAS, VENOUS
Acid-base deficit: 0.3 mmol/L (ref 0.0–2.0)
Bicarbonate: 28.5 mmol/L — ABNORMAL HIGH (ref 20.0–28.0)
O2 Saturation: 36.2 %
Patient temperature: 37
pCO2, Ven: 65 mmHg — ABNORMAL HIGH (ref 44–60)
pH, Ven: 7.25 (ref 7.25–7.43)
pO2, Ven: <31 mmHg — CL (ref 32–45)

## 2024-02-27 LAB — BLOOD GAS, ARTERIAL
Acid-base deficit: 2.4 mmol/L — ABNORMAL HIGH (ref 0.0–2.0)
Bicarbonate: 25.9 mmol/L (ref 20.0–28.0)
O2 Saturation: 86.7 %
Patient temperature: 37
pCO2 arterial: 59 mmHg — ABNORMAL HIGH (ref 32–48)
pH, Arterial: 7.25 — ABNORMAL LOW (ref 7.35–7.45)
pO2, Arterial: 55 mmHg — ABNORMAL LOW (ref 83–108)

## 2024-02-27 LAB — GLUCOSE, CAPILLARY
Glucose-Capillary: 134 mg/dL — ABNORMAL HIGH (ref 70–99)
Glucose-Capillary: 167 mg/dL — ABNORMAL HIGH (ref 70–99)
Glucose-Capillary: 225 mg/dL — ABNORMAL HIGH (ref 70–99)

## 2024-02-27 MED ORDER — BOOST / RESOURCE BREEZE PO LIQD CUSTOM: 1.0000 | Freq: Three times a day (TID) | ORAL | Status: DC

## 2024-02-27 MED ORDER — LORAZEPAM 2 MG/ML IJ SOLN
INTRAMUSCULAR | Status: AC
  Filled 2024-02-27: qty 1

## 2024-02-27 MED ORDER — LORAZEPAM 2 MG/ML IJ SOLN
0.5000 mg | Freq: Once | INTRAMUSCULAR | Status: AC | PRN
  Administered 2024-02-27: 0.5 mg via INTRAVENOUS
  Filled 2024-02-27: qty 1

## 2024-02-27 MED ORDER — LORAZEPAM 2 MG/ML IJ SOLN
1.0000 mg | Freq: Once | INTRAMUSCULAR | Status: AC
  Administered 2024-02-27: 1 mg via INTRAVENOUS

## 2024-02-27 MED ORDER — METHYLPREDNISOLONE SODIUM SUCC 125 MG IJ SOLR
125.0000 mg | Freq: Once | INTRAMUSCULAR | Status: AC
  Administered 2024-02-27: 125 mg via INTRAVENOUS
  Filled 2024-02-27: qty 2

## 2024-02-27 MED ORDER — ACETAMINOPHEN 325 MG PO TABS: 650.0000 mg | ORAL_TABLET | Freq: Four times a day (QID) | ORAL | Status: DC

## 2024-02-27 MED ORDER — TRAVASOL 10 % IV SOLN
INTRAVENOUS | Status: DC
  Filled 2024-02-27: qty 998.4

## 2024-02-27 MED ORDER — METOCLOPRAMIDE HCL 5 MG/ML IJ SOLN
10.0000 mg | Freq: Three times a day (TID) | INTRAMUSCULAR | Status: DC
  Administered 2024-02-27: 10 mg via INTRAVENOUS
  Filled 2024-02-27: qty 2

## 2024-03-07 NOTE — Consult Note (Signed)
 PHARMACY - TOTAL PARENTERAL NUTRITION CONSULT NOTE   Indication: Small bowel obstruction  Patient Measurements: Height: 5' 10 (177.8 cm) Weight: 68.8 kg (151 lb 10.8 oz) IBW/kg (Calculated) : 73 TPN AdjBW (KG): 67.6 Body mass index is 21.76 kg/m.  Assessment:  84 yo male presented on 02/14/24 to ED with complaint of abdominal pain and constipation.  Imaging was concerning for SBO.  Patient had NGT placed for decompression.  Patient had had some improvement and was started on diet, however symptoms have persisted and patient not wanting to eat.  Per surgery note current plan is for patient to be NPO again and initiate TPN.  Repeat CT imaging ordered by surgery, surgical intervention may be needed.  Glucose / Insulin : No history of diabetes.  Will order sensitive SSI with q8 hours CBG monitoring with initiation of TPN. None given in last 24 hours Electrolytes: K 4.8, Mag 2.3, Phos 3.2 Renal: Scr < 1 Hepatic: LFTs WNL, TG 97 Intake / Output; MIVF:   Intake/Output Summary (Last 24 hours) at 03-07-2024 1020 Last data filed at 03-07-2024 0454 Gross per 24 hour  Intake 1033.12 ml  Output --  Net 1033.12 ml     GI Imaging: 1/15 KUB: Evidence of known partial small bowel obstruction with interval increased number of dilated small bowel loops. 1/18 KUB: Gaseous distention of the stomach with mild gaseous distention of large and small bowel loops, most consistent with postoperative ileus 1/20 KUB: Slight interval worsening of multiple air-filled dilated central small bowel loops consistent with  postoperative ileus versus persistent partial small bowel obstruction. 1/21 KUB: Persistent small bowel dilatation 1/22 KUB: Marked gaseous distention of the stomach with diffuse gaseous small bowel dilatation. Imaging features compatible with persistent ileus or obstruction. 1/22 CT ABD: Dilated small bowel loops may represent postoperative ileus. Oral contrast has passed into the colon and extends to  the level of the rectum.  GI Surgeries / Procedures: Laparotomy with extensive enterolysis Repair of ventral hernia x 2  Central access: 02/20/24 TPN start date: 02/20/24  Nutritional Goals: Goal TPN rate is 80 mL/hr (provides 99.8 g of protein and 1858 kcals per day)  RD Assessment: Estimated Needs Total Energy Estimated Needs: 1800-2000 Total Protein Estimated Needs: 90-105 grams Total Fluid Estimated Needs: 1.8-2.0 L  Current Nutrition:  NPO  Plan:  CT negative for acute complications, consistent with post-op ileus - Surgery advancing to clear liquid diet today Continue TPN rate at 80 mL/hr at 1800 (Goal rate) Electrolytes in TPN: Na 17mEq/L, K 25mEq/L, Ca 27mEq/L, Mg 6mEq/L, and Phos 15mmol/L. Cl:Ac 1:1 Add standard MVI and trace elements to TPN Initiate Sensitive q8h SSI and adjust as needed  No MIVF at this time. Thiamine  100 mg IV daily x 7 days per dietary recommendations Monitor TPN labs on Mon/Thurs  Thank you for involving pharmacy in this patient's care.   Damien Napoleon, PharmD Clinical Pharmacist 03/07/24 10:20 AM

## 2024-03-07 NOTE — Plan of Care (Signed)

## 2024-03-07 NOTE — Progress Notes (Signed)
 RR called pt has been in respiratory distress most of the day continuing to get worse  pt was placed on bipap at approx 1520, however, pt will not leave the mask on and his respiratory distress is not getting any better  Pt wa given more ativan  to see if that helps with the anxiety from the mask

## 2024-03-07 NOTE — Progress Notes (Addendum)
 PT Cancellation Note  Patient Details Name: Lonnie Schaefer MRN: [id number] DOB: 05-17-1940   Cancelled Treatment:     Pt currently MEWS 5, with high RR at 31 and Pulse at 138. SpO2 76% on 2L O2. Pt not medically stable to tolerate PT or exertional activity at this time. Will continue to follow acutely per POC.   Addendum: RR called at 4:16pm   Darice JAYSON Bohr 03-26-24, 4:06 PM

## 2024-03-07 NOTE — Progress Notes (Signed)
 SPIRITUAL CARE AND COUNSELING CONSULT NOTE   VISIT SUMMARY -  Chaplain responded to rapid and the patient kept removing their oxygen . So the medical staff was attending to patient and encouraging they .  SPIRITUAL ENCOUNTER                                                                                                                                                                      Type of Visit: Initial Care provided to:: Pt not available Conversation partners present during encounter: Nurse Referral source: Code page Reason for visit: Code OnCall Visit: Yes   SPIRITUAL FRAMEWORK      GOALS       INTERVENTIONS   Spiritual Care Interventions Made: Compassionate presence, Established relationship of care and support    INTERVENTION OUTCOMES   Outcomes: Connection to spiritual care  SPIRITUAL CARE PLAN        If immediate needs arise, please contact [facility] 24 hour on call [phone]   DeJuan MALVA Lesches  03-27-2024 4:49 PM

## 2024-03-07 NOTE — Progress Notes (Signed)
 [facility] SURGICAL PROGRESS NOTE  Hospital Day(s): 13.   Post op day(s): 6 Days Post-Op.   Interval History:  Patient seen and examined No acute events or new complaints overnight.  Patient reports he is feeling better this AM Distension improved; no significant pain  No nausea, emesis, fever No new labs this morning He continues to pass flatus; + BM x2 CT consistent with ileus  Vital signs in last 24 hours: [min-max] current  Temp:  [98.1 F (36.7 C)-98.6 F (37 C)] 98.5 F (36.9 C) 02/28/24 0404) Pulse Rate:  [62-95] 95 02/28/2024 0404) Resp:  [18-22] 19 February 28, 2024 0404) BP: (139-156)/(64-80) 150/80 2024-02-28 0404) SpO2:  [91 %-95 %] 95 % 02/28/2024 0404) Weight:  [68.8 kg] 68.8 kg 2024-02-28 0454)     Height: 5' 10 (177.8 cm) Weight: 68.8 kg BMI (Calculated): 21.76   Intake/Output last 2 shifts:  01/22 0701 - 28-Feb-2024 0700 In: 1033.1 [P.O.:240; I.V.:793.1] Out: -    Physical Exam:  Constitutional: alert, cooperative and no distress  Respiratory: breathing non-labored at rest  Cardiovascular: regular rate and sinus rhythm  Gastrointestinal: soft, non-tender, distension is improved Integumentary: Laparotomy is CDI with staples, previous oozing to central portion seems to have stopped, dressing changed   Labs:     Latest Ref Rng & Units 02/24/2024    3:02 AM 02/21/2024    4:58 AM 02/17/2024   10:34 PM  CBC  WBC 4.0 - 10.5 K/uL 9.5  5.1  9.3   Hemoglobin 13.0 - 17.0 g/dL 87.8  87.6  86.9   Hematocrit 39.0 - 52.0 % 36.5  36.9  38.8   Platelets 150 - 400 K/uL 259  216  204       Latest Ref Rng & Units 02/26/2024    4:49 AM 02/25/2024    7:39 AM 02/23/2024   11:31 AM  CMP  Glucose 70 - 99 mg/dL 868  872    BUN 8 - 23 mg/dL 24  22    Creatinine 9.38 - 1.24 mg/dL 9.33  9.36    Sodium 864 - 145 mmol/L 138  138    Potassium 3.5 - 5.1 mmol/L 4.8  5.0  4.4   Chloride 98 - 111 mmol/L 104  104    CO2 22 - 32 mmol/L 27  27    Calcium  8.9 - 10.3 mg/dL 8.8  9.1    Total  Protein 6.5 - 8.1 g/dL 5.9  6.1    Total Bilirubin 0.0 - 1.2 mg/dL 0.6  0.6    Alkaline Phos 38 - 126 U/L 51  45    AST 15 - 41 U/L 38  23    ALT 0 - 44 U/L 44  29       Imaging studies:   CT Abdomen/Pelvis (02/26/2024) personally reviewed, appears consistent with ileus, no free air, no abscess, and radiologist report reviewed below: IMPRESSION: 1. Postsurgical changes of the abdomen with pneumoperitoneum. 2. Dilated small bowel loops may represent postoperative ileus. Oral contrast has passed into the colon and extends to the level of the rectum. Continued follow-up recommended. 3. Small ascites and diffuse mesenteric edema. 4. Sigmoid diverticulosis. 5.  Aortic Atherosclerosis (ICD10-I70.0).   Assessment/Plan:  84 y.o. male with post-operative ileus 6 Days Post-Op s/p exploratory laparotomy and LOA for SBO   - CT was reassuring without complication (ie: bowel injury, abscess) and appears consistent with ileus. Clinically, he has made good progress in the last 24 hours with continued flatus and multiple BM.  He wishes to trial CLD today, which is reasonable. If he has any issues with this (nausea, worsening distension/pain, emesis), we will back down to NPO and place NGT. He is in agreement   - Continue TPN; at goal; monitor electrolytes  - Monitor abdominal examination; on-going bowel function   - Continue superficial dressings to midline wound; change PRN  - Pain control prn; antiemetics prn - Mobilize; doing well   - Okay to resume DVT prophylaxis - Further management per primary service; we will follow    All of the above findings and recommendations were discussed with the patient, and the medical team, and all of patient's questions were answered to his expressed satisfaction.  -- Arthea Platt, PA-C [facility] 03/14/24, 7:40 AM M-F: 7am - 4pm

## 2024-03-07 NOTE — Death Summary Note (Signed)
 "  DEATH SUMMARY   Patient Details  Name: Lonnie Schaefer MRN: [id number] DOB: 13-Oct-1940 ERE:Azjo, Tylene, PA-C Admission/Discharge Information   Admit Date:  03/05/24  Date of Death: Date of Death: 03/18/2024  Time of Death: Time of Death: 03/29/1828  Length of Stay: 04-08-24   Principle Cause of death: unknown, respiratory distress may have caused sudden cardiac arrest  Hospital Diagnoses: Principal Problem:   Small bowel obstruction (HCC) Active Problems:   Nausea vomiting and diarrhea   SBO (small bowel obstruction) (HCC)   Chronic obstructive pulmonary disease (COPD) (HCC)   Coronary artery disease   Hypothyroidism   Protein-calorie malnutrition, severe   Hospital Course: GEROLD SAR is an 84 year old male with COPD, CAD, inguinal hernia s/p repair and prior SBO, who presented to the emergency department with acute onset recurrent nausea and vomiting as well as diarrhea and abdominal pain. Labs consistent with mild AKI, leukocytosis of 11.5. CT abdomen pelvis with SBO, transition point in the distal jejunum. Also revealed small fat-containing supraumbilical midline ventral hernia and bilateral inguinal hernias without bowel herniation. Colonic diverticulosis without diverticulitis. Inguinal hernia was reduced by Dr. Viviann. NG tube was inserted. General surgery was consulted. Patient has had gradual return of bowel function, and NG tube was removed on 1/13. Ultimately SBO recurred. He underwent ex lap on 1/17 with lysis of adhesions, repair of 2 ventral hernias. Postoperative course further complicated by postop ileus. Patient has been managed on TPN gradual diet advancement.  He was advanced to clear liquid diet today but did not eat anything due to persistent nausea. On 03-18-24 patient began to have respiratory decompensation.  Initial VBG 7.25/65/<31/28.  He was placed on BiPAP.  CXR without new infiltrate or large effusion.  Pulmonary embolism suspected but patient was too unstable to perform  CTA. ABG 2 hours after BiPAP 7.25/59/55/25.  Bedside RN was performing vitals when she noted no pulses.  Patient was DNR. Time of death: 39.   I notified his wife, Candis, by phone.  I discussed directly with his daughter, Stephane, at bedside.   The results of significant diagnostics from this hospitalization (including imaging, microbiology, ancillary and laboratory) are listed below for reference.   Significant Diagnostic Studies: DG Chest Port 1 View Result Date: 2024-03-18 EXAM: 1 VIEW XRAY OF THE CHEST 03/18/24 11:30:00 AM COMPARISON: 02/26/2024 CLINICAL HISTORY: 84 year old male. Dyspnea. Postoperative day 6 status post laparotomy with lysis of adhesions and repair of ventral abdominal hernias. FINDINGS: LINES, TUBES AND DEVICES: Right upper extremity PICC in place with tip at superior cavoatrial junction. LUNGS AND PLEURA: Low lung volumes. Bibasilar atelectasis. Blunting of left costophrenic angle. No focal pulmonary opacity. No pleural effusion. No pneumothorax. HEART AND MEDIASTINUM: Aortic calcification. No acute abnormality of the cardiac and mediastinal silhouettes. BONES AND SOFT TISSUES: Decreased epigastric bowel gas but ongoing gas filled bowel loops in the visible mid abdomen. Lucency below right hemidiaphragm consistent with small volume pneumoperitoneum. No acute osseous abnormality. IMPRESSION: 1. Low lung volumes, bibasilar atelectasis, and small left pleural effusion. 2. Small volume pneumoperitoneum on postoperative day 6 abdominal surgery. Electronically signed by: Helayne Hurst MD Mar 18, 2024 11:51 AM EST RP Workstation: [id number]   CT ABDOMEN PELVIS W CONTRAST Result Date: 02/26/2024 CLINICAL DATA:  Abdominal pain. EXAM: CT ABDOMEN AND PELVIS WITH CONTRAST TECHNIQUE: Multidetector CT imaging of the abdomen and pelvis was performed using the standard protocol following bolus administration of intravenous contrast. RADIATION DOSE REDUCTION: This exam was performed according to the  departmental dose-optimization program which  includes automated exposure control, adjustment of the mA and/or kV according to patient size and/or use of iterative reconstruction technique. CONTRAST:  OMNIPAQUE  IOHEXOL  300 MG/ML  SOLN COMPARISON:  CT abdomen pelvis dated 02/20/2024. FINDINGS: Evaluation of this exam is limited due to respiratory motion. Lower chest: There is elevation of the diaphragms with bibasilar atelectasis. Pneumonia is not excluded. Background of emphysema. There is pneumoperitoneum under the right hemidiaphragm, likely postsurgical. Small ascites and diffuse mesenteric edema Hepatobiliary: The liver is grossly unremarkable.  Cholecystectomy. Pancreas: Unremarkable. No pancreatic ductal dilatation or surrounding inflammatory changes. Spleen: Normal in size without focal abnormality. Adrenals/Urinary Tract: The adrenal glands unremarkable small left renal cyst. There is no hydronephrosis on either side. There is symmetric enhancement and excretion of contrast by both kidneys. The visualized ureters and urinary bladder appear unremarkable. Stomach/Bowel: Evaluation of the bowel is very limited due to severe respiratory motion. There is distension of the stomach with fluid content and air. Several dilated small bowel loops measure up to 5.2 cm in caliber in the upper abdomen. Oral contrast has passed into the colon and extends to the level of the rectum. There is extensive sigmoid diverticulosis. Vascular/Lymphatic: Advanced aortoiliac atherosclerotic disease. The IVC is unremarkable. No portal venous gas. There is no adenopathy. Reproductive: The prostate is grossly unremarkable Other: There is diffuse subcutaneous edema. Small fat containing bilateral inguinal hernia with small amount of fluid in the right inguinal canal. Midline vertical anterior pelvic wall incisional scar and cutaneous staples. Musculoskeletal: No acute osseous pathology. IMPRESSION: 1. Postsurgical changes of the  abdomen with pneumoperitoneum. 2. Dilated small bowel loops may represent postoperative ileus. Oral contrast has passed into the colon and extends to the level of the rectum. Continued follow-up recommended. 3. Small ascites and diffuse mesenteric edema. 4. Sigmoid diverticulosis. 5.  Aortic Atherosclerosis (ICD10-I70.0). Electronically Signed   By: Vanetta Chou M.D.   On: 02/26/2024 19:17   DG ABD ACUTE 2+V W 1V CHEST Result Date: 02/26/2024 CLINICAL DATA:  Ileus. EXAM: DG ABDOMEN ACUTE WITH 1 VIEW CHEST COMPARISON:  02/25/2024 FINDINGS: Low lung volumes. Subtle nodular density in the lateral right lung base is new in the interval, likely atelectatic given the rapid appearance. Similar streaky basilar atelectasis in both lungs. Right PICC line tip overlies the distal SVC level. AP upright chest x-ray again shows lucency under the right hemidiaphragm compatible with intraperitoneal free air. There is marked gaseous distention of the stomach. Supine imaging shows diffuse gaseous small bowel dilatation. Small bowel loop in the midline upper abdomen measures up to 5.6 cm diameter. There is probably some colonic gas in the hepatic flexure IMPRESSION: 1. Persistent intraperitoneal free air, not unexpected 5 days after surgery. 2. Marked gaseous distention of the stomach with diffuse gaseous small bowel dilatation. Imaging features compatible with persistent ileus or obstruction. 3. Low lung volumes with bibasilar atelectasis. 4. New subtle nodular density in the lateral right lung base, likely atelectatic given the rapid appearance. Attention on follow-up recommended. Electronically Signed   By: Camellia Candle M.D.   On: 02/26/2024 07:53   DG ABD ACUTE 2+V W 1V CHEST Result Date: 02/25/2024 CLINICAL DATA:  Ileus. EXAM: DG ABDOMEN ACUTE WITH 1 VIEW CHEST COMPARISON:  Abdominal radiograph dated 02/24/2024. FINDINGS: Right-sided Port-A-Cath with tip at the cavoatrial junction. There is shallow inspiration with  bibasilar atelectasis. No focal consolidation, pleural effusion or pneumothorax. The cardiac silhouette is within limits. Diffuse air distention of the small bowel measure up to 4.5 cm relatively similar to  prior radiograph. Air is noted in the colon. There is a trace right subdiaphragmatic pneumoperitoneum, likely related to recent surgery. Degenerative changes of the spine. No acute osseous pathology. Midline anterior abdominal wall cutaneous staples. IMPRESSION: 1. No acute cardiopulmonary process. 2. Persistent small bowel dilatation. 3. Trace right subdiaphragmatic pneumoperitoneum, likely related to recent surgery. Electronically Signed   By: Vanetta Chou M.D.   On: 02/25/2024 08:30   DG Abd 2 Views Result Date: 02/24/2024 CLINICAL DATA:  Ileus. Post exploratory laparotomy with lysis of adhesions 02/21/2024. EXAM: ABDOMEN - 2 VIEW COMPARISON:  02/22/2024 FINDINGS: Gaseous distention of the stomach. Less right-sided free subdiaphragmatic air from patient's recent exploratory laparotomy/lysis of adhesions. Slight interval worsening of multiple air-filled dilated central small bowel loops measuring up to 5.1 cm in diameter. Air is present over the: As air is seen over the rectum. Skin staples vertically over the abdomen/pelvis. Remainder of the exam is unchanged. IMPRESSION: 1. Slight interval worsening of multiple air-filled dilated central small bowel loops measuring up to 5.1 cm in diameter. Findings may be due to postoperative ileus versus persistent partial small bowel obstruction. 2. Less free subdiaphragmatic air from patient's recent exploratory laparotomy/lysis of adhesions. Electronically Signed   By: Toribio Agreste M.D.   On: 02/24/2024 08:09   DG ABD ACUTE 2+V W 1V CHEST Result Date: 02/22/2024 EXAM: Upright and Supine XRAY Views of the Abdomen and 4 or 2 or 1 View(s) of the Chest 02/22/2024 07:05:29 AM COMPARISON: 02/19/2023 CLINICAL HISTORY: Ileus following gastrointestinal surgery (HCC)  FINDINGS: LUNGS AND PLEURA: Patchy atelectasis at the left lung base. Blunting of left lateral costophrenic angle suggesting small effusion. HEART AND MEDIASTINUM: No acute abnormality of the cardiac and mediastinal silhouettes. BOWEL: There is gaseous distention of the stomach with mild gaseous distention of the large and small bowel loops. Previously noted pathologic dilatation of the small bowel loops has resolved in the interval. PERITONEUM AND SOFT TISSUES: Free intraperitoneal gas. Midline skin staples. BONES: No acute osseous abnormality. VASCULATURE: Right PICC line tip is in the low SVC. Aortoiliac arterial calcifications. Aortic atherosclerosis. IMPRESSION: 1. Gaseous distention of the stomach with mild gaseous distention of large and small bowel loops, most consistent with postoperative ileus 2. Free intraperitoneal gas, presumably postoperative. Electronically signed by: Waddell Calk MD 02/22/2024 07:20 AM EST RP Workstation: [id number]   CT ABDOMEN PELVIS W CONTRAST Result Date: 02/20/2024 EXAM: CT ABDOMEN AND PELVIS WITH CONTRAST 02/20/2024 10:25:00 AM TECHNIQUE: CT of the abdomen and pelvis was performed with the administration of 100 mL of iohexol  (OMNIPAQUE ) 300 MG/ML solution. Multiplanar reformatted images are provided for review. Automated exposure control, iterative reconstruction, and/or weight-based adjustment of the mA/kV was utilized to reduce the radiation dose to as low as reasonably achievable. COMPARISON: 02/14/2024 CLINICAL HISTORY: Bowel obstruction suspected. FINDINGS: LOWER CHEST: Unchanged asymmetric elevation of the right hemidiaphragm. Scarring noted within the posterior left base. LIVER: The liver is unremarkable. GALLBLADDER AND BILE DUCTS: Status post cholecystectomy. Unchanged mild fusiform dilatation of the CBD measuring up to 9 mm. No intrahepatic bile duct dilatation. SPLEEN: The spleen is within normal limits in size and appearance. PANCREAS: Pancreas is normal in size  and contour without focal lesion or ductal dilatation. ADRENAL GLANDS: Normal size and morphology bilaterally. No nodule, thickening, or hemorrhage. No periadrenal stranding. KIDNEYS, URETERS AND BLADDER: Bosniak class II cyst of the lower pole of the left kidney measures 2.7 cm, image 39/2. Per consensus, no follow-up is needed for simple Bosniak type 1 and 2 renal cysts,  unless the patient has a malignancy history or risk factors. No stones in the kidneys or ureters. No hydronephrosis. No perinephric or periureteral stranding. Urinary bladder is unremarkable. GI AND BOWEL: There is diffuse gaseous distention of the stomach. Since the previous exam, there has been interval improvement previously dilated small bowel loops in the left hemiabdomen. The index bowel loop in the left hemiabdomen measures 1.5 cm, image 50/2. Previously 3 cm. Index right abdominal small bowel loop measures 4 cm, image 42/2. Previously 4.7 cm. The terminal ileum has a normal caliber. A transition point is noted within the right hemiabdomen where dilated and nondilated small bowel loops converge, coronal image 25/5 and axial image 47/2. The colon is nondilated. Enteric contrast material is identified within the colon up to the level of the rectum. Small Richter type hernia at the level of the umbilicus is noted containing an anterior wall dilated small bowel loop. PERITONEUM AND RETROPERITONEUM: There is no free fluid or fluid collections within the abdomen or pelvis. No free air. VASCULATURE: Aorta is normal in caliber. Aortic atherosclerotic calcification. LYMPH NODES: No abdominal or pelvic adenopathy identified. REPRODUCTIVE ORGANS: Prostate gland enlargement. BONES AND SOFT TISSUES: Thoracolumbar scoliosis and degenerative disc disease. No acute osseous abnormality. No focal soft tissue abnormality. IMPRESSION: 1. Persistent abnormal small bowel dilatation in the right hemiabdomen with transition point in the right hemiabdomen. Interval  improvement in small bowel dilatation in the left hemiabdomen. Antegrade progression of enteric contrast material through the small bowel loops into the colon up to the rectum. Imaging findings are compatible with improving small bowel obstruction with persistent partial obstructive changes in the right hemiabdomen. Electronically signed by: Waddell Calk MD 02/20/2024 11:00 AM EST RP Workstation: [id number]   US  EKG SITE RITE Result Date: 02/20/2024 If Site Rite image not attached, placement could not be confirmed due to current cardiac rhythm.  DG Abd 1 View Result Date: 02/19/2024 CLINICAL DATA:  Small bowel obstruction. EXAM: ABDOMEN - 1 VIEW COMPARISON:  02/17/2024 FINDINGS: Bowel gas pattern demonstrates evidence of multiple air-filled dilated small bowel loops measuring up to 5.2 cm in diameter. Interval increased number of these dilated small bowel loops. Air and contrast is present throughout the colon. No free peritoneal air. Remainder of the exam is unchanged. IMPRESSION: Evidence of known partial small bowel obstruction with interval increased number of dilated small bowel loops. Electronically Signed   By: Toribio Agreste M.D.   On: 02/19/2024 07:54   DG Abd 2 Views Result Date: 02/17/2024 CLINICAL DATA:  Small bowel obstruction. EXAM: ABDOMEN - 2 VIEW COMPARISON:  02/16/2024 FINDINGS: Nasogastric tube has tip just right of midline in the mid abdomen likely over the distal stomach. Air and contrast are present throughout the colon as contrast is present over the rectum. There are a couple air-filled mildly dilated small bowel loops in the left abdomen without significant change. No free peritoneal air. Remainder of the exam is unchanged. IMPRESSION: Findings suggesting partial small bowel obstruction without significant change. Electronically Signed   By: Toribio Agreste M.D.   On: 02/17/2024 12:20   DG Abd Portable 1V-Small Bowel Obstruction Protocol-initial, 8 hr delay Result Date:  02/16/2024 CLINICAL DATA:  8 hour delay small bowel protocol EXAM: PORTABLE ABDOMEN - 1 VIEW COMPARISON:  02/16/2024 FINDINGS: Enteric tube tip overlies the distal stomach/gastroduodenal region. Dilute contrast within dilated small bowel in the left mid quadrant. Small bowel distension persists. Contrast is present in the right colon. Residual contrast in the bladder. IMPRESSION: Persistent small  bowel distension with dilute contrast in dilated left mid quadrant small bowel, but with some contrast present in the right colon suggesting partial obstruction. Electronically Signed   By: Luke Bun M.D.   On: 02/16/2024 22:56   DG Abd 2 Views Result Date: 02/16/2024 CLINICAL DATA:  Bowel obstruction EXAM: ABDOMEN - 2 VIEW COMPARISON:  02/15/2024 FINDINGS: Enteric tube tip overlies the distal stomach. No gross free air beneath the diaphragm. Persistent dilatation of small bowel loops measuring up to 5.2 cm, with fluid levels on upright exam. Excreted contrast in the bladder. Some colorectal gas is present. IMPRESSION: Persistent dilatation of small bowel loops with fluid levels on upright exam, consistent with small bowel obstruction. Electronically Signed   By: Luke Bun M.D.   On: 02/16/2024 22:53   DG Abd 2 Views Result Date: 02/15/2024 CLINICAL DATA:  Small bowel obstruction. EXAM: ABDOMEN - 2 VIEW COMPARISON:  Earlier same day FINDINGS: NG tube tip is in the distal stomach in the region the pylorus. Transpyloric extension into the proximal duodenum is not excluded. NG tube could be withdrawn 3-4 cm to ensure placement in the distal stomach. There is diffuse gaseous dilatation of small bowel up to 4.7 cm diameter. No substantial colonic gas although there may be some minimal gas in the right colon. Contrast material in the bladder is consistent with recent CT imaging. IMPRESSION: 1. NG tube tip is in the distal stomach in the region of the pylorus. Transpyloric extension into the proximal duodenum is not  excluded. NG tube could be withdrawn 3-4 cm to ensure placement in the distal stomach. 2. Diffuse gaseous dilatation of small bowel with minimal gas in the right colon. Imaging features remain compatible with small bowel obstruction with no substantial change when comparing to scout film from CT scan yesterday. Electronically Signed   By: Camellia Candle M.D.   On: 02/15/2024 07:47   DG Abd Portable 1 View Result Date: 02/15/2024 EXAM: 1 VIEW XRAY OF THE ABDOMEN 02/15/2024 01:31:00 AM COMPARISON: None available. CLINICAL HISTORY: NG Placement. FINDINGS: LINES, TUBES AND DEVICES: Enteric tube terminates in the mid gastric body. BOWEL: Nonobstructive bowel gas pattern. SOFT TISSUES: No abnormal calcifications. BONES: No acute fracture. LUNGS: Mild basilar atelectasis. IMPRESSION: 1. Enteric tube terminates in the mid gastric body. Electronically signed by: Pinkie Pebbles MD MD 02/15/2024 01:32 AM EST RP Workstation: [id number]   CT ABDOMEN PELVIS W CONTRAST Result Date: 02/14/2024 CLINICAL DATA:  Abdominal pain, nausea and vomiting, diarrhea for 2 days EXAM: CT ABDOMEN AND PELVIS WITH CONTRAST TECHNIQUE: Multidetector CT imaging of the abdomen and pelvis was performed using the standard protocol following bolus administration of intravenous contrast. RADIATION DOSE REDUCTION: This exam was performed according to the departmental dose-optimization program which includes automated exposure control, adjustment of the mA and/or kV according to patient size and/or use of iterative reconstruction technique. CONTRAST:  OMNIPAQUE  IOHEXOL  300 MG/ML  SOLN COMPARISON:  08/05/2019 FINDINGS: Lower chest: No acute pleural or parenchymal lung disease. Hepatobiliary: No focal liver abnormality is seen. Status post cholecystectomy. No biliary dilatation. Pancreas: Unremarkable. No pancreatic ductal dilatation or surrounding inflammatory changes. Spleen: Normal in size without focal abnormality. Adrenals/Urinary Tract:  Adrenals are unremarkable. Simple cyst lower pole left kidney does not require specific follow-up. Otherwise the kidneys enhance normally. No urinary tract calculi or obstructive uropathy. Bladder is unremarkable. Stomach/Bowel: Dilation of the small bowel to the level of the distal jejunum, measuring up to 4.2 cm in maximal diameter with multiple scattered gas fluid  levels, consistent with small-bowel obstruction. Transition from dilated to nondilated small bowel is seen within the right lower quadrant image 71/2. The colon is decompressed. Diffuse colonic diverticulosis without diverticulitis. No bowel wall thickening or inflammatory change. Prior appendectomy. Vascular/Lymphatic: Aortic atherosclerosis. No enlarged abdominal or pelvic lymph nodes. Reproductive: Prostate is unremarkable. Other: Trace pelvic free fluid. No free intraperitoneal gas. Small fat containing supraumbilical midline ventral hernia. Bilateral fat containing inguinal hernias. Musculoskeletal: No acute or destructive bony abnormalities. Reconstructed images demonstrate no additional findings. IMPRESSION: 1. Small-bowel obstruction, with transition point in the distal jejunum right lower quadrant. 2. Colonic diverticulosis without diverticulitis. 3. Small fat containing supraumbilical midline ventral hernia and bilateral inguinal hernias. No bowel herniation. 4.  Aortic Atherosclerosis (ICD10-I70.0). Electronically Signed   By: Ozell Daring M.D.   On: 02/14/2024 23:12    Microbiology: No results found for this or any previous visit (from the past 240 hours).   Signed: Gedalia Mcmillon, DO    "

## 2024-03-07 NOTE — Progress Notes (Signed)
 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  Ongoing  GOAL:   Patient will meet greater than or equal to 90% of their needs  Met with TPN  MONITOR:   Diet advancement  REASON FOR ASSESSMENT:   Consult New TPN/TNA  ASSESSMENT:   84 year old male with medical history significant for COPD, coronary artery disease, inguinal hernia s/p repair and SBO, who presented to the emergency room with acute onset of recurrent nausea and vomiting with associated diarrhea and abdominal pain over the last couple of days PTA.  No bilious vomitus or hematemesis.  No melena or bright red bleeding per rectum.  He had subsequent significant diminished p.o. intake.  1/11- NGT placed for decompression 1/16- PICC placed, TPN 1/12- gastrograffin challenge 1/13- clamping trial, advanced to clear liquids, NGT removed 1/14- successful gastrograffin challenge 1/15- advanced to full liquid diet 1/16- KUB reveals more dilated loops of small bowel, NPO, PICC placed, TPN initiated 1/17- s/p laparotomy with extensive enterolysis, repair of ventral hernia x 2 1/20- KUB revealed dilated stomach, slightly distended, passing small amounts of flatus, keep NPO 1/21- KUB consistent with ileus, continue NPO 1/22- plan for CT, patient with persistent ileus  03/10/24- patient remains with ileus but with slight improvement, CT reveals no abscess or missed bowel perforation, Flatus, advanced to clear liquid diet  Reviewed I/O's: +1 L x hours and  +11.1 L since admission   Per general surgery notes, surgical findings are as follows: 1) One band from omentum causing obstruction within proximal ileum, extensive adhesions from abdominal wall to bowel, bowel to bowel; 2) Periumbilical ventral hernia 2 cm; and 3) Epigastric ventral hernia 1 cm.   Per general surgery notes, patient with flatus and multiple BMs. He has been advanced to clear liquid diet today.   Patient receiving TPN at 80 ml/hr, which provides 1859 kcals and 100 grams protein, which provides 100% of estimated kcal and protein needs.   Case discussed with pharmacy; will likely need TPN for at least the nest few days in order to monitor to diet advancement and adequate oral intake. Plan to continue at goal rate.   Reviewed weights, which have ranged from 64.9-69.9 kg over the past 6 days.   Medications reviewed and include lovenox  and reglan .   Labs reviewed: K, Mg, and Phos WDL. CBGS: 131-144 (inpatient orders for glycemic control are 0-6 units insulin  aspart every 8 hours).    Diet Order:   Diet Order             Diet clear liquid Fluid consistency: Thin  Diet effective now                   EDUCATION NEEDS:   Education needs have been addressed  Skin:  Skin Assessment: Skin Integrity Issues: Skin Integrity Issues:: Incisions Incisions: closed abdomen  Last BM:  03-10-24 (type 4)  Height:   Ht Readings from Last 1 Encounters:  02/15/24 5' 10 (1.778 m)    Weight:   Wt Readings from Last 1 Encounters:  2024-03-13 68.8 kg    Ideal Body Weight:  75.5 kg  BMI:  Body mass index is 21.76 kg/m.  Estimated Nutritional Needs:   Kcal:  1800-2000  Protein:  90-105 grams  Fluid:  1.8-2.0 L    Margery ORN, RD, LDN, CDCES Registered Dietitian III Certified Diabetes Care and Education Specialist If unable to reach this RD, please use RD Inpatient group chat on secure chat between hours of 8am-4 pm daily

## 2024-03-07 NOTE — Progress Notes (Signed)
 Pt transported to room 257 on bipap

## 2024-03-07 NOTE — Progress Notes (Signed)
 " PROGRESS NOTE    Lonnie Schaefer  FMW:[id number] DOB: [birth year] DOA: 02/14/2024 PCP: Samie Frederick, PA-C  Chief Complaint  Patient presents with   Abdominal Pain   Diarrhea   Emesis    Hospital Course:  Lonnie Schaefer is an 84 year old male with COPD, CAD, inguinal hernia s/p repair and prior SBO, who presented to the emergency department with acute onset recurrent nausea and vomiting as well as diarrhea and abdominal pain. Labs consistent with mild AKI, leukocytosis of 11.5. CT abdomen pelvis with SBO, transition point in the distal jejunum. Also revealed small fat-containing supraumbilical midline ventral hernia and bilateral inguinal hernias without bowel herniation. Colonic diverticulosis without diverticulitis. Inguinal hernia was reduced by Dr. Viviann. NG tube was inserted. General surgery was consulted. Patient has had gradual return of bowel function, and NG tube was removed on 1/13.  Ultimately SBO recurred.  He underwent ex lap on 1/17 with lysis of adhesions, repair of 2 ventral hernias.  Postoperative course further complicated by postop ileus.  Subjective: This morning patient has some tachypnea and increased work of breathing.  He also endorses nausea.  He is having flatus and bowel movements but reports there is no way he could attempt to eat at this time due to his nausea.   Objective: Vitals:   03/27/2024 0404 2024/03/27 0454 27-Mar-2024 0910 2024/03/27 1109  BP: (!) 150/80   (!) 152/70  Pulse: 95   (!) 108  Resp: 19   20  Temp: 98.5 F (36.9 C)   97.8 F (36.6 C)  TempSrc: Oral     SpO2: 95%  92% 94%  Weight:  68.8 kg    Height:        Intake/Output Summary (Last 24 hours) at March 27, 2024 1246 Last data filed at 2024-03-27 0454 Gross per 24 hour  Intake 1033.12 ml  Output --  Net 1033.12 ml   Filed Weights   02/23/24 0335 02/24/24 0500 2024-03-27 0454  Weight: 66.6 kg 69.9 kg 68.8 kg    Examination: General exam: Appears calm and comfortable, NAD  Respiratory system:  Tachypneic, diffuse wheezing bilaterally, symmetric chest wall expansion, use of accessory muscles Cardiovascular system: S1 & S2 heard, RRR.  Gastrointestinal system: Abdomen is nondistended, soft and nontender. BS present Neuro: Alert and oriented.  Assessment & Plan:  Principal Problem:   Small bowel obstruction (HCC) Active Problems:   Nausea vomiting and diarrhea   SBO (small bowel obstruction) (HCC)   Chronic obstructive pulmonary disease (COPD) (HCC)   Coronary artery disease   Hypothyroidism   Protein-calorie malnutrition, severe    SBO Postop ileus - Failed conservative management - Ex lap 1/17: Lysis of adhesion, repair of 2 ventral hernias - Complicated by postop ileus, has been slow to resolve - Repeat CT scan 1/22 showing improvement - Patient has been started on clear liquid diet - Continue with TPN until p.o. intake is sufficient - Schedule Reglan  for persistent nausea - Superficial dressing changes with RN for midline wound  Acute urinary retention - Resolved now - Resolved, required 1 In-and Out cath - Continue Flomax  - Continue to monitor for retention.  Bladder scan as needed  COPD, now with exacerbation Nocturnal hypoxia on 2 L O2 nightly - Increased work of breathing today with wheezing.  Minimal improvement with breathing treatments - 125 Solu-Medrol  ordered, continue with prednisone  taper after that. - Continue with nebulizer therapy - CXR with small left pleural effusion.  Patient is not overtly volume overloaded.  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  Continue  to work with PT/OT as much as able.  Would likely benefit from SNF at DC  Body mass index is 21.76 kg/m.  DVT prophylaxis: Lovenox    Code Status: Do not attempt resuscitation (DNR) PRE-ARREST INTERVENTIONS DESIRED Disposition: Inpatient pending clinical improvement  Consultants:  Treatment Team:  Consulting Physician: Marinda Jayson KIDD, MD  Procedures:  Ex lap 1/17: Lysis of adhesion, repair of 2 ventral hernias  Antimicrobials:  Anti-infectives (From admission, onward)    Start     Dose/Rate Route Frequency Ordered Stop   02/21/24 2200  cefoTEtan  (CEFOTAN ) 2 g in sodium chloride  0.9 % 100 mL IVPB        2 g 200 mL/hr over 30 Minutes Intravenous Every 12 hours 02/21/24 1419 02/22/24 1029   02/21/24 0900  cefoTEtan  (CEFOTAN ) 2 g in sodium chloride  0.9 % 100 mL IVPB        2 g 200 mL/hr over 30 Minutes Intravenous  Once 02/20/24 1558 02/21/24 1155   02/21/24 0600  ceFAZolin  (ANCEF ) IVPB 2g/100 mL premix  Status:  Discontinued        2 g 200 mL/hr over 30 Minutes Intravenous On call to O.R. 02/20/24 1224 02/20/24 1558       Data Reviewed: I have personally reviewed following labs and imaging studies CBC: Recent Labs  Lab 02/21/24 0458 02/24/24 0302  WBC 5.1 9.5  HGB 12.3* 12.1*  HCT 36.9* 36.5*  MCV 95.3 95.3  PLT 216 259   Basic Metabolic Panel: Recent Labs  Lab 02/21/24 0458 02/22/24 0456 02/23/24 0837 02/23/24 1131 02/25/24 0739 02/26/24 0449  NA 141 144 141  --  138 138  K 4.8 4.9 5.3* 4.4 5.0 4.8  CL 105 107 105  --  104 104  CO2 30 29 28   --  27 27  GLUCOSE 114* 144* 138*  --  127* 131*  BUN 15 15 15   --  22 24*  CREATININE 0.83 0.96 0.69  --  0.63 0.66  CALCIUM  8.6* 8.7* 8.7*  --  9.1 8.8*  MG 2.2 2.2 2.2  --  2.3 2.3  PHOS 2.3* 3.0 2.5  --  3.2 3.2   GFR: Estimated Creatinine Clearance: 70.5 mL/min (by C-G formula based on SCr of 0.66 mg/dL). Liver Function Tests: Recent Labs  Lab 02/23/24 0837 02/25/24 0739 02/26/24 0449  AST 41 23 38   ALT 42 29 44  ALKPHOS 46 45 51  BILITOT 0.5 0.6 0.6  PROT 6.1* 6.1* 5.9*  ALBUMIN 3.3* 3.3* 3.4*   CBG: Recent Labs  Lab 02/26/24 0218 02/26/24 0927 02/26/24 1751 02/26/24 2356 March 07, 2024 0913  GLUCAP 139* 136* 124* 131* 134*    No results found for this or any previous visit (from the past 240 hours).   Radiology Studies: DG Chest Port 1 View Result Date: 03/07/24 EXAM: 1 VIEW XRAY OF THE CHEST March 07, 2024 11:30:00 AM COMPARISON: 02/26/2024 CLINICAL HISTORY: 84 year old male. Dyspnea. Postoperative day 6 status post laparotomy with lysis of adhesions and repair of ventral abdominal hernias. FINDINGS: LINES, TUBES AND DEVICES: Right upper extremity PICC in place with tip at superior cavoatrial  junction. LUNGS AND PLEURA: Low lung volumes. Bibasilar atelectasis. Blunting of left costophrenic angle. No focal pulmonary opacity. No pleural effusion. No pneumothorax. HEART AND MEDIASTINUM: Aortic calcification. No acute abnormality of the cardiac and mediastinal silhouettes. BONES AND SOFT TISSUES: Decreased epigastric bowel gas but ongoing gas filled bowel loops in the visible mid abdomen. Lucency below right hemidiaphragm consistent with small volume pneumoperitoneum. No acute osseous abnormality. IMPRESSION: 1. Low lung volumes, bibasilar atelectasis, and small left pleural effusion. 2. Small volume pneumoperitoneum on postoperative day 6 abdominal surgery. Electronically signed by: Helayne Hurst MD March 26, 2024 11:51 AM EST RP Workstation: [id number]   CT ABDOMEN PELVIS W CONTRAST Result Date: 02/26/2024 CLINICAL DATA:  Abdominal pain. EXAM: CT ABDOMEN AND PELVIS WITH CONTRAST TECHNIQUE: Multidetector CT imaging of the abdomen and pelvis was performed using the standard protocol following bolus administration of intravenous contrast. RADIATION DOSE REDUCTION: This exam was performed according to the departmental dose-optimization program which includes automated exposure control, adjustment of  the mA and/or kV according to patient size and/or use of iterative reconstruction technique. CONTRAST:  OMNIPAQUE  IOHEXOL  300 MG/ML  SOLN COMPARISON:  CT abdomen pelvis dated 02/20/2024. FINDINGS: Evaluation of this exam is limited due to respiratory motion. Lower chest: There is elevation of the diaphragms with bibasilar atelectasis. Pneumonia is not excluded. Background of emphysema. There is pneumoperitoneum under the right hemidiaphragm, likely postsurgical. Small ascites and diffuse mesenteric edema Hepatobiliary: The liver is grossly unremarkable.  Cholecystectomy. Pancreas: Unremarkable. No pancreatic ductal dilatation or surrounding inflammatory changes. Spleen: Normal in size without focal abnormality. Adrenals/Urinary Tract: The adrenal glands unremarkable small left renal cyst. There is no hydronephrosis on either side. There is symmetric enhancement and excretion of contrast by both kidneys. The visualized ureters and urinary bladder appear unremarkable. Stomach/Bowel: Evaluation of the bowel is very limited due to severe respiratory motion. There is distension of the stomach with fluid content and air. Several dilated small bowel loops measure up to 5.2 cm in caliber in the upper abdomen. Oral contrast has passed into the colon and extends to the level of the rectum. There is extensive sigmoid diverticulosis. Vascular/Lymphatic: Advanced aortoiliac atherosclerotic disease. The IVC is unremarkable. No portal venous gas. There is no adenopathy. Reproductive: The prostate is grossly unremarkable Other: There is diffuse subcutaneous edema. Small fat containing bilateral inguinal hernia with small amount of fluid in the right inguinal canal. Midline vertical anterior pelvic wall incisional scar and cutaneous staples. Musculoskeletal: No acute osseous pathology. IMPRESSION: 1. Postsurgical changes of the abdomen with pneumoperitoneum. 2. Dilated small bowel loops may represent postoperative ileus. Oral  contrast has passed into the colon and extends to the level of the rectum. Continued follow-up recommended. 3. Small ascites and diffuse mesenteric edema. 4. Sigmoid diverticulosis. 5.  Aortic Atherosclerosis (ICD10-I70.0). Electronically Signed   By: Vanetta Chou M.D.   On: 02/26/2024 19:17   DG ABD ACUTE 2+V W 1V CHEST Result Date: 02/26/2024 CLINICAL DATA:  Ileus. EXAM: DG ABDOMEN ACUTE WITH 1 VIEW CHEST COMPARISON:  02/25/2024 FINDINGS: Low lung volumes. Subtle nodular density in the lateral right lung base is new in the interval, likely atelectatic given the rapid appearance. Similar streaky basilar atelectasis in both lungs. Right PICC line tip overlies the distal SVC level. AP upright chest x-ray again shows lucency under the right hemidiaphragm compatible with intraperitoneal free air. There is marked gaseous distention of the stomach. Supine imaging shows diffuse gaseous small bowel dilatation. Small bowel loop in the midline upper abdomen measures up to 5.6  cm diameter. There is probably some colonic gas in the hepatic flexure IMPRESSION: 1. Persistent intraperitoneal free air, not unexpected 5 days after surgery. 2. Marked gaseous distention of the stomach with diffuse gaseous small bowel dilatation. Imaging features compatible with persistent ileus or obstruction. 3. Low lung volumes with bibasilar atelectasis. 4. New subtle nodular density in the lateral right lung base, likely atelectatic given the rapid appearance. Attention on follow-up recommended. Electronically Signed   By: Camellia Candle M.D.   On: 02/26/2024 07:53    Scheduled Meds:  acetaminophen   650 mg Oral Q6H   arformoterol   15 mcg Nebulization BID   And   umeclidinium bromide   1 puff Inhalation Daily   Chlorhexidine  Gluconate Cloth  6 each Topical Daily   enoxaparin  (LOVENOX ) injection  40 mg Subcutaneous Q24H   insulin  aspart  0-6 Units Subcutaneous Q8H   levothyroxine   75 mcg Oral Q0600   metoCLOPramide  (REGLAN )  injection  10 mg Intravenous Q8H   ranolazine   500 mg Oral BID   sodium chloride  flush  10-40 mL Intracatheter Q12H   tamsulosin   0.4 mg Oral Daily   Continuous Infusions:  TPN ADULT (ION) 80 mL/hr at 2024/03/15 0454   TPN ADULT (ION)       LOS: 13 days  MDM: Patient is high risk for one or more organ failure.  They necessitate ongoing hospitalization for continued IV therapies and subsequent lab monitoring. Total time spent interpreting labs and vitals, reviewing the medical record, coordinating care amongst consultants and care team members, directly assessing and discussing care with the patient and/or family: 55 min  Elaisha Zahniser, DO [facility]  To contact the attending physician between 7A-7P please use Epic Chat. To contact the covering physician during after hours 7P-7A, please review Amion.  2024/03/15, 12:46 PM   *This document has been created with the assistance of dictation software. Please excuse typographical errors. *   "

## 2024-03-07 NOTE — Significant Event (Signed)
 Rapid Response Event Note   Reason for Call : called RRT for resp distress, increased work of breathing   Initial Focused Assessment: Dr Leesa to bedside, bipap, and ativan  ordered, will transfer to 2A      Interventions: Bipap, ativan  and transfer to 2A   Plan of Care: as above    Event Summary:   MD Notified: Dezii 1616 Call Time:1616 Arrival Time:1618 End Upfz:[id number]  Tyreona Panjwani A, RN

## 2024-03-07 NOTE — Progress Notes (Addendum)
" ° °  Interim progress note   Rapid response Note Prior to rapid response I placed patient on BiPAP due to hypoxia and increased work of breathing.  He is pending progressive bed at this time.   Rapid response was called as patient was having hard time keeping on his BiPAP despite 0.5 mg Ativan  to help with agitation.  He remained agitated.  Upon my arrival he is answering questions and more adherent to BiPAP.  1 mg Ativan  administered and patient immediately became calm and was able to lay back in bed. Was* started on clear liquid diet today but has not had anything to eat due to persistent nausea.  He is having multiple liquid stools but no vomiting. CXR earlier today without evidence of acute infiltrate, small left pleural effusion.  If respiratory status does not improve on BiPAP will need to pursue CTA to rule out PE.  Currently on prophylactic Lovenox . Will continue with BiPAP.  Repeat ABG at 1800.  Has been ordered.  Discussed with RRT.  I have discussed with ICU charge RN, patient is DNR but is okay for intubation.     March 08, 2024    4:02 PM 2024/03/08   11:09 AM 03-08-24    4:54 AM  Vitals with BMI  Weight   151 lbs 11 oz  BMI   21.76  Systolic 126 152   Diastolic 76 70   Pulse 138 108        Latest Ref Rng & Units 02/24/2024    3:02 AM 02/21/2024    4:58 AM 02/17/2024   10:34 PM  CBC  WBC 4.0 - 10.5 K/uL 9.5  5.1  9.3   Hemoglobin 13.0 - 17.0 g/dL 87.8  87.6  86.9   Hematocrit 39.0 - 52.0 % 36.5  36.9  38.8   Platelets 150 - 400 K/uL 259  216  204        Latest Ref Rng & Units 02/26/2024    4:49 AM 02/25/2024    7:39 AM 02/23/2024   11:31 AM  BMP  Glucose 70 - 99 mg/dL 868  872    BUN 8 - 23 mg/dL 24  22    Creatinine 9.38 - 1.24 mg/dL 9.33  9.36    Sodium 864 - 145 mmol/L 138  138    Potassium 3.5 - 5.1 mmol/L 4.8  5.0  4.4   Chloride 98 - 111 mmol/L 104  104    CO2 22 - 32 mmol/L 27  27    Calcium  8.9 - 10.3 mg/dL 8.8  9.1     CRITICAL CARE Performed by:  Marlowe Cinquemani   Total critical care time: 33 minutes Critical care time was exclusive of separately billable procedures and treating other patients Critical care was necessary to treat or prevent imminent or life-threatening deterioration. Critical care was time spent personally by me on the following activities: development of treatment plan with patient and/or surrogate as well as nursing, discussions with consultants, evaluation of patient's response to treatment, examination of patient, obtaining history from patient or surrogate, ordering and performing treatments and interventions, ordering and review of laboratory studies, ordering and review of radiographic studies, pulse oximetry and re-evaluation of patient's condition.   "

## 2024-03-07 DEATH — deceased

## 2024-09-21 ENCOUNTER — Other Ambulatory Visit

## 2024-09-21 ENCOUNTER — Ambulatory Visit: Admitting: Nurse Practitioner
